# Patient Record
Sex: Female | Born: 1940 | Race: White | Hispanic: No | Smoking: Former smoker
Health system: Southern US, Community
[De-identification: ages and names within clinical notes are randomized; demographics above are authoritative.]

## PROBLEM LIST (undated history)

## (undated) DIAGNOSIS — E119 Type 2 diabetes mellitus without complications: Secondary | ICD-10-CM

## (undated) DIAGNOSIS — F329 Major depressive disorder, single episode, unspecified: Secondary | ICD-10-CM

## (undated) DIAGNOSIS — J302 Other seasonal allergic rhinitis: Secondary | ICD-10-CM

## (undated) DIAGNOSIS — K219 Gastro-esophageal reflux disease without esophagitis: Secondary | ICD-10-CM

## (undated) DIAGNOSIS — E039 Hypothyroidism, unspecified: Secondary | ICD-10-CM

## (undated) DIAGNOSIS — G4730 Sleep apnea, unspecified: Secondary | ICD-10-CM

## (undated) DIAGNOSIS — I2510 Atherosclerotic heart disease of native coronary artery without angina pectoris: Secondary | ICD-10-CM

## (undated) DIAGNOSIS — Z950 Presence of cardiac pacemaker: Secondary | ICD-10-CM

## (undated) DIAGNOSIS — F419 Anxiety disorder, unspecified: Secondary | ICD-10-CM

## (undated) DIAGNOSIS — G629 Polyneuropathy, unspecified: Secondary | ICD-10-CM

## (undated) DIAGNOSIS — G4733 Obstructive sleep apnea (adult) (pediatric): Secondary | ICD-10-CM

## (undated) DIAGNOSIS — N189 Chronic kidney disease, unspecified: Secondary | ICD-10-CM

## (undated) DIAGNOSIS — I499 Cardiac arrhythmia, unspecified: Secondary | ICD-10-CM

## (undated) DIAGNOSIS — G43909 Migraine, unspecified, not intractable, without status migrainosus: Secondary | ICD-10-CM

## (undated) DIAGNOSIS — R011 Cardiac murmur, unspecified: Secondary | ICD-10-CM

## (undated) DIAGNOSIS — I495 Sick sinus syndrome: Secondary | ICD-10-CM

## (undated) DIAGNOSIS — E785 Hyperlipidemia, unspecified: Secondary | ICD-10-CM

## (undated) DIAGNOSIS — I10 Essential (primary) hypertension: Secondary | ICD-10-CM

## (undated) DIAGNOSIS — I4891 Unspecified atrial fibrillation: Secondary | ICD-10-CM

## (undated) HISTORY — DX: Unspecified atrial fibrillation: I48.91

## (undated) HISTORY — DX: Gastro-esophageal reflux disease without esophagitis: K21.9

## (undated) HISTORY — DX: Chronic kidney disease, unspecified: N18.9

## (undated) HISTORY — PX: BREAST EXCISIONAL BIOPSY: SUR124

## (undated) HISTORY — DX: Essential (primary) hypertension: I10

## (undated) HISTORY — DX: Sleep apnea, unspecified: G47.30

## (undated) HISTORY — DX: Polyneuropathy, unspecified: G62.9

## (undated) HISTORY — PX: APPENDECTOMY: SHX54

## (undated) HISTORY — DX: Hyperlipidemia, unspecified: E78.5

## (undated) HISTORY — PX: PACEMAKER INSERTION: SHX728

## (undated) HISTORY — DX: Major depressive disorder, single episode, unspecified: F32.9

## (undated) HISTORY — DX: Type 2 diabetes mellitus without complications: E11.9

## (undated) HISTORY — DX: Sick sinus syndrome: I49.5

## (undated) HISTORY — DX: Migraine, unspecified, not intractable, without status migrainosus: G43.909

## (undated) HISTORY — DX: Other seasonal allergic rhinitis: J30.2

## (undated) HISTORY — PX: BREAST SURGERY: SHX581

## (undated) HISTORY — DX: Atherosclerotic heart disease of native coronary artery without angina pectoris: I25.10

## (undated) HISTORY — DX: Hypothyroidism, unspecified: E03.9

## (undated) HISTORY — PX: OTHER SURGICAL HISTORY: SHX169

## (undated) HISTORY — DX: Obstructive sleep apnea (adult) (pediatric): G47.33

## (undated) HISTORY — PX: CHOLECYSTECTOMY: SHX55

## (undated) HISTORY — PX: ECTOPIC PREGNANCY SURGERY: SHX613

---

## 1974-10-07 HISTORY — PX: ABDOMINAL HYSTERECTOMY: SHX81

## 1998-10-07 HISTORY — PX: TUMOR REMOVAL: SHX12

## 1999-07-30 ENCOUNTER — Encounter: Admission: RE | Admit: 1999-07-30 | Discharge: 1999-07-30 | Payer: Self-pay | Admitting: *Deleted

## 1999-07-30 ENCOUNTER — Encounter: Payer: Self-pay | Admitting: *Deleted

## 1999-08-07 ENCOUNTER — Encounter: Payer: Self-pay | Admitting: *Deleted

## 1999-08-07 ENCOUNTER — Encounter: Admission: RE | Admit: 1999-08-07 | Discharge: 1999-08-07 | Payer: Self-pay | Admitting: *Deleted

## 1999-09-05 ENCOUNTER — Ambulatory Visit (HOSPITAL_BASED_OUTPATIENT_CLINIC_OR_DEPARTMENT_OTHER): Admission: RE | Admit: 1999-09-05 | Discharge: 1999-09-05 | Payer: Self-pay | Admitting: General Surgery

## 1999-09-05 ENCOUNTER — Encounter (INDEPENDENT_AMBULATORY_CARE_PROVIDER_SITE_OTHER): Payer: Self-pay | Admitting: *Deleted

## 1999-09-05 ENCOUNTER — Encounter: Payer: Self-pay | Admitting: General Surgery

## 2000-02-19 ENCOUNTER — Encounter: Admission: RE | Admit: 2000-02-19 | Discharge: 2000-02-19 | Payer: Self-pay | Admitting: *Deleted

## 2000-02-19 ENCOUNTER — Encounter: Payer: Self-pay | Admitting: *Deleted

## 2000-05-12 ENCOUNTER — Ambulatory Visit (HOSPITAL_COMMUNITY): Admission: RE | Admit: 2000-05-12 | Discharge: 2000-05-12 | Payer: Self-pay | Admitting: Gastroenterology

## 2000-06-18 ENCOUNTER — Other Ambulatory Visit: Admission: RE | Admit: 2000-06-18 | Discharge: 2000-06-18 | Payer: Self-pay | Admitting: Obstetrics and Gynecology

## 2000-08-20 ENCOUNTER — Ambulatory Visit (HOSPITAL_COMMUNITY): Admission: RE | Admit: 2000-08-20 | Discharge: 2000-08-20 | Payer: Self-pay | Admitting: Surgery

## 2000-08-20 ENCOUNTER — Encounter: Payer: Self-pay | Admitting: Surgery

## 2001-06-24 ENCOUNTER — Encounter: Admission: RE | Admit: 2001-06-24 | Discharge: 2001-06-24 | Payer: Self-pay | Admitting: Internal Medicine

## 2001-06-24 ENCOUNTER — Encounter: Payer: Self-pay | Admitting: Internal Medicine

## 2001-12-01 ENCOUNTER — Inpatient Hospital Stay (HOSPITAL_COMMUNITY): Admission: EM | Admit: 2001-12-01 | Discharge: 2001-12-02 | Payer: Self-pay

## 2001-12-01 ENCOUNTER — Encounter: Payer: Self-pay | Admitting: Internal Medicine

## 2002-09-10 ENCOUNTER — Encounter: Payer: Self-pay | Admitting: Internal Medicine

## 2002-09-10 ENCOUNTER — Encounter: Admission: RE | Admit: 2002-09-10 | Discharge: 2002-09-10 | Payer: Self-pay | Admitting: Internal Medicine

## 2002-11-10 ENCOUNTER — Ambulatory Visit (HOSPITAL_COMMUNITY): Admission: RE | Admit: 2002-11-10 | Discharge: 2002-11-10 | Payer: Self-pay | Admitting: Gastroenterology

## 2002-11-10 ENCOUNTER — Encounter (INDEPENDENT_AMBULATORY_CARE_PROVIDER_SITE_OTHER): Payer: Self-pay | Admitting: Specialist

## 2003-06-27 ENCOUNTER — Encounter: Admission: RE | Admit: 2003-06-27 | Discharge: 2003-06-27 | Payer: Self-pay | Admitting: Internal Medicine

## 2003-06-27 ENCOUNTER — Encounter: Payer: Self-pay | Admitting: Internal Medicine

## 2003-12-22 ENCOUNTER — Encounter: Admission: RE | Admit: 2003-12-22 | Discharge: 2003-12-22 | Payer: Self-pay | Admitting: Internal Medicine

## 2004-08-20 ENCOUNTER — Ambulatory Visit (HOSPITAL_BASED_OUTPATIENT_CLINIC_OR_DEPARTMENT_OTHER): Admission: RE | Admit: 2004-08-20 | Discharge: 2004-08-20 | Payer: Self-pay | Admitting: Internal Medicine

## 2004-09-19 ENCOUNTER — Ambulatory Visit (HOSPITAL_BASED_OUTPATIENT_CLINIC_OR_DEPARTMENT_OTHER): Admission: RE | Admit: 2004-09-19 | Discharge: 2004-09-19 | Payer: Self-pay | Admitting: Internal Medicine

## 2004-12-25 ENCOUNTER — Encounter: Admission: RE | Admit: 2004-12-25 | Discharge: 2004-12-25 | Payer: Self-pay | Admitting: Internal Medicine

## 2005-02-01 ENCOUNTER — Other Ambulatory Visit: Admission: RE | Admit: 2005-02-01 | Discharge: 2005-02-01 | Payer: Self-pay | Admitting: Obstetrics and Gynecology

## 2006-01-17 ENCOUNTER — Encounter: Payer: Self-pay | Admitting: Cardiovascular Disease

## 2006-01-27 ENCOUNTER — Encounter: Payer: Self-pay | Admitting: Cardiovascular Disease

## 2006-02-12 ENCOUNTER — Ambulatory Visit (HOSPITAL_COMMUNITY): Admission: RE | Admit: 2006-02-12 | Discharge: 2006-02-13 | Payer: Self-pay | Admitting: Cardiovascular Disease

## 2006-02-12 HISTORY — PX: CARDIAC CATHETERIZATION: SHX172

## 2006-04-06 DIAGNOSIS — I495 Sick sinus syndrome: Secondary | ICD-10-CM

## 2006-04-06 HISTORY — DX: Sick sinus syndrome: I49.5

## 2006-04-15 ENCOUNTER — Inpatient Hospital Stay (HOSPITAL_COMMUNITY): Admission: RE | Admit: 2006-04-15 | Discharge: 2006-04-16 | Payer: Self-pay | Admitting: Cardiology

## 2006-10-07 HISTORY — PX: ABDOMINAL ADHESION SURGERY: SHX90

## 2007-01-05 ENCOUNTER — Encounter: Payer: Self-pay | Admitting: Cardiovascular Disease

## 2007-01-07 ENCOUNTER — Encounter: Admission: RE | Admit: 2007-01-07 | Discharge: 2007-01-07 | Payer: Self-pay | Admitting: Cardiovascular Disease

## 2007-04-22 ENCOUNTER — Emergency Department (HOSPITAL_COMMUNITY): Admission: EM | Admit: 2007-04-22 | Discharge: 2007-04-22 | Payer: Self-pay | Admitting: Emergency Medicine

## 2007-04-23 ENCOUNTER — Inpatient Hospital Stay (HOSPITAL_COMMUNITY): Admission: AD | Admit: 2007-04-23 | Discharge: 2007-04-26 | Payer: Self-pay | Admitting: Internal Medicine

## 2007-05-08 ENCOUNTER — Ambulatory Visit (HOSPITAL_COMMUNITY): Admission: RE | Admit: 2007-05-08 | Discharge: 2007-05-08 | Payer: Self-pay | Admitting: Gastroenterology

## 2007-05-15 ENCOUNTER — Encounter: Admission: RE | Admit: 2007-05-15 | Discharge: 2007-05-15 | Payer: Self-pay | Admitting: Gastroenterology

## 2007-06-29 ENCOUNTER — Ambulatory Visit (HOSPITAL_COMMUNITY): Admission: RE | Admit: 2007-06-29 | Discharge: 2007-06-29 | Payer: Self-pay | Admitting: Surgery

## 2007-09-16 ENCOUNTER — Encounter: Admission: RE | Admit: 2007-09-16 | Discharge: 2007-09-16 | Payer: Self-pay | Admitting: Internal Medicine

## 2007-11-08 HISTORY — PX: WRIST FRACTURE SURGERY: SHX121

## 2007-11-15 ENCOUNTER — Emergency Department (HOSPITAL_COMMUNITY): Admission: EM | Admit: 2007-11-15 | Discharge: 2007-11-15 | Payer: Self-pay | Admitting: Emergency Medicine

## 2007-11-20 ENCOUNTER — Ambulatory Visit (HOSPITAL_COMMUNITY): Admission: RE | Admit: 2007-11-20 | Discharge: 2007-11-20 | Payer: Self-pay | Admitting: Orthopedic Surgery

## 2009-01-02 ENCOUNTER — Encounter: Admission: RE | Admit: 2009-01-02 | Discharge: 2009-01-02 | Payer: Self-pay | Admitting: Cardiology

## 2009-01-04 ENCOUNTER — Ambulatory Visit: Payer: Self-pay | Admitting: Vascular Surgery

## 2009-01-12 ENCOUNTER — Encounter: Payer: Self-pay | Admitting: Cardiovascular Disease

## 2009-02-03 ENCOUNTER — Encounter: Admission: RE | Admit: 2009-02-03 | Discharge: 2009-02-03 | Payer: Self-pay | Admitting: Unknown Physician Specialty

## 2009-09-15 ENCOUNTER — Encounter: Admission: RE | Admit: 2009-09-15 | Discharge: 2009-09-15 | Payer: Self-pay | Admitting: Unknown Physician Specialty

## 2010-05-07 ENCOUNTER — Encounter: Admission: RE | Admit: 2010-05-07 | Discharge: 2010-05-07 | Payer: Self-pay | Admitting: Internal Medicine

## 2010-06-23 ENCOUNTER — Encounter: Payer: Self-pay | Admitting: Internal Medicine

## 2010-06-27 ENCOUNTER — Ambulatory Visit: Payer: Self-pay | Admitting: Cardiology

## 2010-10-04 ENCOUNTER — Ambulatory Visit: Payer: Self-pay | Admitting: Cardiology

## 2010-10-17 ENCOUNTER — Emergency Department (HOSPITAL_COMMUNITY)
Admission: EM | Admit: 2010-10-17 | Discharge: 2010-10-18 | Disposition: A | Discharge status: Other Acute Inpatient Hospital | Payer: Self-pay | Source: Home / Self Care | Admitting: Emergency Medicine

## 2010-10-22 LAB — CBC
HCT: 41.5 % (ref 36.0–46.0)
Hemoglobin: 13.7 g/dL (ref 12.0–15.0)
MCH: 29.1 pg (ref 26.0–34.0)
MCHC: 33 g/dL (ref 30.0–36.0)
MCV: 88.1 fL (ref 78.0–100.0)
Platelets: 191 10*3/uL (ref 150–400)
RBC: 4.71 MIL/uL (ref 3.87–5.11)
RDW: 13.7 % (ref 11.5–15.5)
WBC: 5.3 10*3/uL (ref 4.0–10.5)

## 2010-10-22 LAB — BASIC METABOLIC PANEL
BUN: 23 mg/dL (ref 6–23)
Calcium: 9.6 mg/dL (ref 8.4–10.5)
Chloride: 105 mEq/L (ref 96–112)
Creatinine, Ser: 1.28 mg/dL — ABNORMAL HIGH (ref 0.4–1.2)
GFR calc non Af Amer: 41 mL/min — ABNORMAL LOW (ref >60)
Glucose, Bld: 141 mg/dL — ABNORMAL HIGH (ref 70–99)
Potassium: 3.6 mEq/L (ref 3.5–5.1)
Sodium: 143 mEq/L (ref 135–145)

## 2010-10-22 LAB — RAPID URINE DRUG SCREEN, HOSP PERFORMED
Amphetamines: NOT DETECTED
Barbiturates: NOT DETECTED
Benzodiazepines: POSITIVE — AB
Cocaine: NOT DETECTED
Opiates: NOT DETECTED
Tetrahydrocannabinol: NOT DETECTED

## 2010-10-22 LAB — DIFFERENTIAL
Basophils Absolute: 0.1 10*3/uL (ref 0.0–0.1)
Basophils Relative: 1 % (ref 0–1)
Eosinophils Absolute: 0.1 10*3/uL (ref 0.0–0.7)
Eosinophils Relative: 2 % (ref 0–5)
Lymphocytes Relative: 33 % (ref 12–46)
Lymphs Abs: 1.7 10*3/uL (ref 0.7–4.0)
Monocytes Absolute: 0.3 10*3/uL (ref 0.1–1.0)
Monocytes Relative: 5 % (ref 3–12)
Neutro Abs: 3.1 10*3/uL (ref 1.7–7.7)
Neutrophils Relative %: 59 % (ref 43–77)

## 2010-10-22 LAB — URINALYSIS, ROUTINE W REFLEX MICROSCOPIC
Bilirubin Urine: NEGATIVE
Hgb urine dipstick: NEGATIVE
Ketones, ur: NEGATIVE mg/dL
Protein, ur: NEGATIVE mg/dL
Urobilinogen, UA: 1 mg/dL (ref 0.0–1.0)

## 2010-10-22 LAB — URINE MICROSCOPIC-ADD ON

## 2010-10-22 LAB — TRICYCLICS SCREEN, URINE: TCA Scrn: NOT DETECTED

## 2010-10-22 LAB — ETHANOL: Alcohol, Ethyl (B): <5 mg/dL (ref 0–10)

## 2010-11-05 ENCOUNTER — Encounter: Payer: Self-pay | Admitting: Internal Medicine

## 2010-11-05 ENCOUNTER — Ambulatory Visit
Admission: RE | Admit: 2010-11-05 | Discharge: 2010-11-05 | Payer: Self-pay | Source: Home / Self Care | Attending: Internal Medicine | Admitting: Internal Medicine

## 2010-11-08 NOTE — Miscellaneous (Signed)
Summary: Device preload  Clinical Lists Changes  Observations: Added new observation of PPM INDICATN: Sick sinus syndrome (06/23/2010 12:01) Added new observation of MAGNET RTE: BOL 85 ERI 65 (06/23/2010 12:01) Added new observation of PPMLEADSTAT2: active (06/23/2010 12:01) Added new observation of PPMLEADSER2: [id number]  (06/23/2010 12:01) Added new observation of PPMLEADMOD2: 4470  (06/23/2010 12:01) Added new observation of PPMLEADDOI2: 04/15/2006  (06/23/2010 12:01) Added new observation of PPMLEADLOC2: RV  (06/23/2010 12:01) Added new observation of PPMLEADSTAT1: active  (06/23/2010 12:01) Added new observation of PPMLEADSER1: [id number]  (06/23/2010 12:01) Added new observation of PPMLEADMOD1: 4469  (06/23/2010 12:01) Added new observation of PPMLEADDOI1: 04/15/2006  (06/23/2010 12:01) Added new observation of PPMLEADLOC1: RA  (06/23/2010 12:01) Added new observation of PPM IMP MD: Duffy Rhody Tennant,MD  (06/23/2010 12:01) Added new observation of PPM DOI: 04/15/2006  (06/23/2010 12:01) Added new observation of PPM SERL#: [id number]  (06/23/2010 12:01) Added new observation of PPM MODL#: [id number]  (06/23/2010 12:01) Added new observation of PACEMAKERMFG: Medtronic  (06/23/2010 12:01) Added new observation of PPM REFER MD: Duffy Rhody Tennant,MD  (06/23/2010 12:01) Added new observation of PACEMAKER MD: Hillis Range, MD  (06/23/2010 12:01)      PPM Specifications Following MD:  Hillis Range, MD     Referring MD:  Rolla Plate PPM Vendor:  Medtronic     PPM Model Number:  [id number]     PPM Serial Number:  [id number] H PPM DOI:  04/15/2006     PPM Implanting MD:  Rolla Plate  Lead 1    Location: RA     DOI: 04/15/2006     Model #: [id number]     Serial #: [id number]     Status: active Lead 2    Location: RV     DOI: 04/15/2006     Model #: [id number]     Serial #: [id number]     Status: active  Magnet Response Rate:  BOL 85 ERI 65  Indications:  Sick sinus syndrome

## 2010-11-22 NOTE — Procedures (Signed)
Summary: mdt ppm   Current Medications (verified): 1)  Tricor 145 Mg Tabs (Fenofibrate) .... Take 1 Tablet By Mouth Once Daily 2)  Prevacid 30 Mg Cpdr (Lansoprazole) .... Take 2 Capsules By Mouth Once Daily 3)  Zantac 150 Mg Tabs (Ranitidine Hcl) .... Take 1 Tablet By Mouth Once Daily 4)  Triamterene-Hctz 37.5-25 Mg Tabs (Triamterene-Hctz) .... Take 1 Tablet By Mouth Once Daily 5)  Spironolactone 25 Mg Tabs (Spironolactone) .... Take 1 Tablet By Mouth Two Times A Day 6)  Lumigan 0.03 % Soln (Bimatoprost) .... Use As Directed Daily 7)  Timolol Maleate 0.5 % Soln (Timolol Maleate) .... Use As Directed Daily 8)  Aspirin 325 Mg Tabs (Aspirin) .... Take 1 Tablet By Mouth Once Daily 9)  Toprol Xl 25 Mg Xr24h-Tab (Metoprolol Succinate) .... Take 1/2 Tablet By Mouth Two Times A Day 10)  Crestor 5 Mg Tabs (Rosuvastatin Calcium) .... Take 1 Tablet By Mouth Once Daily 11)  Synthroid 75 Mcg Tabs (Levothyroxine Sodium) .... Take 1 Tablet By Mouth Once Daily 12)  Celexa 40 Mg Tabs (Citalopram Hydrobromide) .... Take 1 Tablet By Mouth Once Daily 13)  Abilify 10 Mg Tabs (Aripiprazole) .... Take 1 Tablet By Mouth Once Daily 14)  Wellbutrin Xl 300 Mg Xr24h-Tab (Bupropion Hcl) .... Take 1 Tablet By Mouth Once Daily 15)  Vitamin D 1000 Unit Tabs (Cholecalciferol) .... Take 2 Tablets By Mouth Once Daily 16)  Namenda 10 Mg Tabs (Memantine Hcl) .... Take 2 Tablets By Mouth Once Daily 17)  Viibryd 40 Mg Tabs (Vilazodone Hcl) .... Take 1 Tabet By Mouth Once Daily  Allergies (verified): 1)  ! Pcn 2)  ! Sulfa 3)  ! * Plavix 4)  ! Lipitor 5)  ! Zocor 6)  ! Zestril  PPM Specifications Following MD:  Hillis Range, MD     Referring MD:  Rolla Plate PPM Vendor:  Medtronic     PPM Model Number:  [id number]     PPM Serial Number:  [id number] H PPM DOI:  04/15/2006     PPM Implanting MD:  Rolla Plate  Lead 1    Location: RA     DOI: 04/15/2006     Model #: [id number]     Serial #: [id number]     Status: active Lead  2    Location: RV     DOI: 04/15/2006     Model #: [id number]     Serial #: [id number]     Status: active  Magnet Response Rate:  BOL 85 ERI 65  Indications:  Sick sinus syndrome   PPM Follow Up Battery Voltage:  2.99 V       PPM Device Measurements Atrium  Amplitude: 3.5 mV, Impedance: 456 ohms, Threshold: 0.5 V at 0.4 msec Right Ventricle  Amplitude: 16 mV, Impedance: 624 ohms, Threshold: 1.0 V at 0.4 msec  Episodes MS Episodes:  0     Ventricular High Rate:  0     Atrial Pacing:  12%     Ventricular Pacing:  0%  Parameters Mode:  MVP     Lower Rate Limit:  60     Upper Rate Limit:  130 Paced AV Delay:  300     Sensed AV Delay:  300 Next Cardiology Appt Due:  02/05/2011 Tech Comments:  [place] [facility] PT----PT HAD ECT TREATMENT AT [facility] AND NEEDED RATE RESPONSE TURNED ON.  NORMAL DEVICE FUNCTION.  CHANGED RV OUTPUT FROM 2.O TO 2.5 V.  ROV IN 3 MTHS W/SK. Vella Kohler  November 11, 2010 1:35 PM

## 2010-11-28 NOTE — Cardiovascular Report (Signed)
Summary: Office Visit   Office Visit   Imported By: Roderic Ovens 11/20/2010 14:34:59  _____________________________________________________________________  External Attachment:    Type:   Image     Comment:   External Document

## 2010-11-29 ENCOUNTER — Other Ambulatory Visit (INDEPENDENT_AMBULATORY_CARE_PROVIDER_SITE_OTHER): Payer: Medicare Other

## 2010-11-29 DIAGNOSIS — I4891 Unspecified atrial fibrillation: Secondary | ICD-10-CM

## 2011-01-01 ENCOUNTER — Telehealth: Payer: Self-pay | Admitting: *Deleted

## 2011-01-01 NOTE — Telephone Encounter (Signed)
RN received a message from daughter Waynetta Sandy regarding pt's Crestor.   Pt has been off Crestor for a month and muscular pain is better.  No other complaints.  Norma Fredrickson NP notified and instructed RN to have pt to continue to stay off Crestor and have labs in three months.  [name] notified of Lori's instructions and labs scheduled for pt in June.

## 2011-02-19 NOTE — Consult Note (Signed)
NAMENEYSA, ARTS                  ACCOUNT NO.:  [id number]   MEDICAL RECORD NO.:  [id number]          PATIENT TYPE:  EMS   LOCATION:  MAJO                         FACILITY:  [facility]   PHYSICIAN:  Artist Pais. Weingold, M.D.DATE OF BIRTH:  January 09, 1941   DATE OF CONSULTATION:  11/16/2007  DATE OF DISCHARGE:  11/15/2007                                 CONSULTATION   ER CONSULT   REQUESTING PHYSICIAN:  Dr. Freida Busman or Dr. Weldon Inches.   REASON FOR CONSULTATION:  Ms. Hoopingarner is a very pleasant 70 year old  right-hand dominant female who fell while at a soccer game presents with  a displaced intra-articular fracture, distal radius and ulna, dominant  right side.  She is 70 years old.  Again, she is right-hand dominant.   ALLERGIES:  SHE HAS AN ALLERGY TO SULFA DRUGS, AS WELL AS SEVERAL OTHER  MEDICATIONS.  SHE HAS ALLERGIES TO PENICILLIN, ZESTRIL AND ZOCOR.   SOCIAL HISTORY:  Does not drink or smoke.   PAST MEDICAL HISTORY:  Significant for A-fib, depression, diabetes,  reflux disease, hyperlipidemia, hypertension, migraines.  She has a  pacemaker placed, as well as sleep apnea.  She is currently on an  aspirin a day, Celexa, Crestor, digoxin, hydrochlorothiazide and  Neurontin, Norvasc, Prevacid, atenolol, Synthroid, Toprol, Zyrtec and  Wellbutrin.  She has allergies to Lipitor, penicillin, sulfa, Zestril  and Zocor.   EXAMINATION:  GENERAL:  A well-nourished female, pleasant, alert and  oriented x3.  EXTREMITIES:  Examination of her upper extremity on the right.  She has  a painful and swollen and slightly deformed wrist and hand.  She is  neurovascularly intact.  She has good digital range of motion.  She has  median, ulnar, and radial nerve function intact.  No evidence of skin  breakdown, no bleeding wounds, no open wounds etc.  Left upper extremity  was normal by comparison.  X-rays show a fracture of the distal radius  and ulna intra-articular.   This is a 70 year old female,  intra-articular fracture right distal  radius.  I discussed with both Ms. Mckown and her daughter, she will be  placed in a well-padded sugar-tong splint.  The patient has no  significant excess dorsal angulation.  We will see her in my office on  Tuesday and set her up electively for open reduction, internal fixation,  carpal tunnel release, etc.  She is discharged on a sugar tong splint,  Percocet for pain, again follow up my office this Tuesday.      Artist Pais Mina Marble, M.D.  Electronically Signed     MAW/MEDQ  D:  11/16/2007  T:  11/16/2007  Job:  [id number]

## 2011-02-19 NOTE — Procedures (Signed)
CAROTID DUPLEX EXAM   INDICATION:  Dizziness and syncope.   HISTORY:  Diabetes:  No.  Cardiac:  Pacemaker.  Hypertension:  Yes.  Smoking:  No.  Previous Surgery:  No.  CV History:  No.  Amaurosis Fugax No, Paresthesias No, Hemiparesis No                                       RIGHT             LEFT  Brachial systolic pressure:         102               110  Brachial Doppler waveforms:         Biphasic          Biphasic  Vertebral direction of flow:        Antegrade         Antegrade  DUPLEX VELOCITIES (cm/sec)  CCA peak systolic                   78                85  ECA peak systolic                   119               113  ICA peak systolic                   95                77  ICA end diastolic                   35                26  PLAQUE MORPHOLOGY:                  Calcified         Calcified  PLAQUE AMOUNT:                      Mild              Mild  PLAQUE LOCATION:                    Bif/ICA           Bif/ICA   IMPRESSION:  Bilateral 20-39% internal carotid artery stenosis.   ___________________________________________  Larina Earthly, M.D.   AC/MEDQ  D:  01/05/2009  T:  01/05/2009  Job:  [id number]

## 2011-02-19 NOTE — Op Note (Signed)
NAMEAALYSSA, ELDERKIN                  ACCOUNT NO.:  [id number]   MEDICAL RECORD NO.:  [id number]          PATIENT TYPE:  AMB   LOCATION:  DAY                          FACILITY:  [facility]   PHYSICIAN:  Thornton Park. Daphine Deutscher, MD  DATE OF BIRTH:  [birth year]   DATE OF PROCEDURE:  06/29/2007  DATE OF DISCHARGE:                               OPERATIVE REPORT   CCS NUMBER:  [id number].   PREOPERATIVE INDICATIONS:  The patient is a 70 year old white female who  has had recent right-sided abdominal pain to the right of her umbilicus  requiring hospitalization.  She had a negative CT enterography and  negative vascular studies.  There was a point of maximal tenderness  which is about 2 inches to the right of her umbilicus when she was  sitting up, which she was reproducible both on her visit June 05, 2007, as well as this morning when I marked her in the holding area.  There was a little an X placed and it was again antibiotics about 2  inches to the right of the umbilicus in the midline at a site of her  previous right paramedian incision.   As a part of the informed consent, she is aware that laparoscopy and  possible laparotomy would be done and that this might not relieve her  abdominal pain.   ALLERGIES:  She is was allergic to PENICILLIN and SULFA.   CURRENT MEDICATIONS:  Tricor, Prevacid, spironolactone, Neurontin,  Timolol Optic, Toprol XL, digoxin, Celexa, Maxalt XL, morphine, Norvasc,  triamterene, hydrochlorothiazide, Lumigan, Zyrtec, Crestor, Synthroid,  propranolol, and Phenergan.   PREVIOUS SURGICAL HISTORY:  1. Hysterectomy.  2. Cholecystectomy.  3. Appendectomy.  4. She has an indwelling permanent Medtronic pacemaker.   SURGEON:  Thornton Park. Daphine Deutscher, MD.   ASSISTANT:  None.   ANESTHESIA:  General.   DESCRIPTION OF PROCEDURE:  The patient was taken back to room 11, given  general anesthesia.  The abdomen was prepped with Techni-Care and draped  sterilely.  Previously, the X  marked over her abdomen was there and  visible.  I entered the abdomen through the left upper quadrant using a  0 degree 5 mm scope, the Opti[employer]u [employer] technique.  The abdomen was  entered without difficulty and insufflated.  Once insufflation occurred,  I had the scope in place and I was struck by the adhesions right beneath  the site of the X.  I took a picture, which is included her chart.  I  put another 5 mm port on the left side to left of the umbilicus without  difficulty using an [employer] 5.  I then used sharp dissection  with no electrocautery to sharply take down these adhesions at the  anterior abdominal wall.  These were taken all the way up to the liver  and the ligament of Treitz.  I took down some adhesions, allowing the  pyloric region of her stomach to kind of fall back from the anterior  abdominal wall.   When these were all taken down, I surveyed the anterior abdominal wall,  did  not see any evidence of a hernia or any kind of a permanent suture  that could be cutting and causing the pain.  The abdominal viscera were  examined, removed the omentum superiorly exposing the transverse colon  and there was a band from the omentum down to the ascending colon that  could produce an internal hernia and I went ahead and sharply lysed  that.  Small intestine was peristalsing nicely.  It was striking there  was an absence of adhesions.  Once these anterior abdominal wall  adhesions had been taken down, the bowel itself looked free and clear.  I looked at the liver and it looked normal and the spleen looked normal.  The ascending colon appeared without masses.  Transverse colon a large  omental mass coming off of that, but it was freed from the bowel.  The  descending colon looked to be in good order as did the pelvis where she  had had previous surgery.   As I had found positive findings beneath the site of her pain, I elected  to stop the procedure at this point and  withdrew the gas.  The wounds  were closed with 4-0 Vicryl, Benzoin Steri-Strips.  They had been  injected with Marcaine.  The patient was awakened and taken to the  recovery room in satisfactory addition.  She will hopefully be able to  go home later in the day.   FINAL DIAGNOSIS:  Adhesions to the anterior abdominal wall at the site  of pain, status post laparoscopy and enterolysis.      Thornton Park Daphine Deutscher, MD  Electronically Signed     MBM/MEDQ  D:  06/29/2007  T:  06/29/2007  Job:  [id number]   cc:   Georgann Housekeeper, MD  Fax: [phone]   Shirley Friar, MD  Fax: [phone]   Vesta Mixer, M.D.  Fax: [phone]

## 2011-02-19 NOTE — Op Note (Signed)
NAMEQuentin, Heidi [name]                  ACCOUNT NO.:  [id number]   MEDICAL RECORD NO.:  [id number]          PATIENT TYPE:  AMB   LOCATION:  SDS                          FACILITY:  [facility]   PHYSICIAN:  Artist Pais. Weingold, M.D.DATE OF BIRTH:  26-Feb-1941   DATE OF PROCEDURE:  11/20/2007  DATE OF DISCHARGE:  11/20/2007                               OPERATIVE REPORT   PREOPERATIVE DIAGNOSIS:  Displaced fracture, right distal radius.   POSTOPERATIVE DIAGNOSIS:  Displaced fracture, right distal radius.   PROCEDURE:  Open reduction internal fixation above, using DVR plate and  screws.  Standard right plate and carpal tunnel release through separate  incision.   SURGEON:  Artist Pais. Mina Marble, M.D.   ASSISTANT:  None.   ANESTHESIA:  General.   TOURNIQUET TIME:  55 minutes.   COMPLICATIONS:  No complications.   DRAINS:  None.   OPERATIVE REPORT:  The patient was taken to the operating suite.  After  the induction of adequate general anesthesia, the right upper extremity  was prepped and draped in the usual sterile fashion.  An Esmarch was  used to exsanguinate the limb.  The tourniquet was then inflated to 250  mmHg.  At this point in time, an incision was made on the volar aspect  of the right distal forearm and wrist area; centered over the flexor  carpi radialis tendon.  The skin was incised for 68 cm.  The sheath  overlying the FCR was incised.  The FCR was tracked to the midline and  the radial artery to the lateral side.  The fascia underneath the FCR  was incised.  The dissection was carried down to the level of the  pronator quadratus.  The pronator quadratus was subperiosteally stripped  off the distal radius volar aspect, revealing intra-articular fracture -  - that was reduced with traction and manual pressure.  After this was  done, the first dorsal compartment was released as well as the brachial  radialis; to help aid in maintaining reduction.  Once this was done, a  standard DVR right plate was fastened to the volar aspect of the distal  radius through the slotted hole.  Intraoperative fluoroscopy revealed  good placement of the plate.  This was fixed with the 4 proximal  threaded cortical screws, followed by 7 smooth pegs distally.  Intraoperative fluoroscopy revealed anatomic reduction in both the AP  lateral and oblique views.  The wound was then thoroughly irrigated.  It  was closed in layers of 0 Vicryl for the pronator quadratus, and then 3-  0 Prolene subcuticular stitch on the skin.  A second incision was made  in the palmar aspect of the right hand, in line with the long finger  metacarpal; starting at Kaplan's cardinal line.  Skin was incised.  Palmar fascia was identified and split.  The distal edge of the  transverse carpal ligament was then divided under direct vision.  The  distal edges of the transverse carpal ligament was then divided with a  #15 blade.  The median nerve was identified, protected with the Cisco  elevator.  The remaining aspects of the transverse carpal ligament were  then divided under direct vision using curved, blunt scissors.  The  canal was inspected.  There were  no osseous lesions or ganglions present.  A small amount of blood was in  the canal, which was irrigated; and then this was also loosely closed  with a 3-0 Prolene subcuticular stitch.  Steri-Strips, 4x4 fluffs and a  volar splint was applied.  The patient tolerated the procedures well and  went to the recovery room in stable fashion.      Artist Pais Mina Marble, M.D.  Electronically Signed     MAW/MEDQ  D:  11/20/2007  T:  11/22/2007  Job:  [id number]

## 2011-02-19 NOTE — H&P (Signed)
Heidi [name], Heidi [name]                  ACCOUNT NO.:  [id number]   MEDICAL RECORD NO.:  [id number]          PATIENT TYPE:  EMS   LOCATION:  MAJO                         FACILITY:  [facility]   PHYSICIAN:  Georgann Housekeeper, MD      DATE OF BIRTH:  12/21/1940   DATE OF ADMISSION:  04/22/2007  DATE OF DISCHARGE:                              HISTORY & PHYSICAL   CHIEF COMPLAINT:  Abdominal pain, nausea, vomiting.   HISTORY OF PRESENT ILLNESS:  A 70 year old female with history of  hypertension, paroxysmal atrial fibrillation, pacemaker insertion 2007,  sleep apnea and acid reflux disease comes in complaining of abdominal  pain. Patient initially seen at the office on 7/14 with a 3 or 4 day  history of abdominal pain and some nausea. She had lab work done  including blood chemistry, blood counts, and abdominal plain x-ray which  is unremarkable. Did not have any evidence of any obstruction. She has a  little bit of constipation. She took some stool softener, MiraLax, but  she said had loose stools. Continues to have abdominal pain, persistent,  constant, mid to right abdomen area 7/10 pain with some vomiting and  poor p.o. intake. The patient was seen in the emergency room earlier in  the morning. Had a CT scan of the abdomen and pelvis which is negative.  Lab work was all unremarkable including lipase, CBC, blood chemistries,  and urinalysis. Patient comes to the office and will be admitted for  further evaluation.   ALLERGIES:  SULFA, PENICILLIN. INTOLERANT TO ZOCOR AND PRAVACHOL.   PAST MEDICAL HISTORY:  1. __________ atrial fibrillation.  2. Pacemaker, has seen Dr. Elease Hashimoto.  3. Hypertension.  4. Obstructive sleep apnea.  5. Type 2 diabetes, diet controlled.  6. Acid reflux disease.  7. Depression.  8. Neuropathy.  9. Osteopenia.  10.Dyslipidemia.  11.Hypothyroidism.   MEDICATIONS:  1. Celexa 60 mg daily.  2. Prevacid 30 mg daily.  3. Aspirin 325 daily.  4. Maxzide 25 mg daily.  5.  Tricor 145 mg daily.  6. Aldactone 25 mg b.i.d.  7. Norvasc 5 mg daily.  8. Neurontin 300 mg t.i.d.  9. Crestor 5 mg daily.  10.Toprol 50 mg 1/2 tablet q. day.  11.Digoxin 0.25 daily.  12.Synthroid 50 mcg daily.  13.CPAP machine at night.   PAST SURGICAL HISTORY:  1. Status post gallbladder surgery.  2. Status post __________.  3. Status post appendectomy.  4. Status post pacemaker insertion 2007.   SOCIAL HISTORY:  She is widow. Does not smoke, no alcohol. 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 She has  1 son and daughter.   FAMILY HISTORY:  Noncontributory.   PROCEDURES:  1. As far as her colon evaluation, it was done in December of 2007      which showed small benign polyp.  2. EGD in 2001.   PHYSICAL EXAMINATION:  VITAL SIGNS:  Blood pressure:  120/80.  Temperature:  99.2. Pulse:  74. Weight:  138 pounds.  GENERAL:  She was awake, lying on the exam table, complaining  of some  abdominal pain.  HEENT:  Pupils reactive. Extraocular movements intact.  NECK:  Supple.  CARDIOVASCULAR:  Regular rhythm. S1, S2. Pacemaker insertion on the left  chest area.  LUNGS:  Clear.  ABDOMEN:  Soft, tender in the mid abdomen. Positive bowel sounds,  nondistended.  EXTREMITIES:  No edema.  NEURO:  Nonfocal.   LABORATORY DATA:  From 04/23/07 from the ER; UA was negative. White count  6.0, hemoglobin 14.3. Chemistry:  sodium 137, potassium 3.2, creatinine  0.7. LFTs:  AST of 42, ALT 49, total bili __________. Lipase was 18.  CT of the abdomen and pelvis was negative.   IMPRESSION:  A 70 year old female with abdominal pain, nausea, and  vomiting going on for about a week. CT of the abdomen and pelvis  negative. Lab unremarkable. No evidence of infection or bowel  obstruction. Differential includes mesenteric ischemia, stenosis, or SMA  versus gastroenteritis. Clinically no evidence of a bowel obstruction.   PLAN:  1. Admit to telemetry, clear  liquid diet, IV fluids, CT angio of the      abdomen and pelvis. Repeat the blood      chemistry and blood counts tomorrow and thyroid check. GI consult.      Wait on abdominal pelvis angio results.  2. Hypertension:  Continue on current regimen. Hold the Maxzide and      Aldactone for now.  3. Mild hypokalemia:  Replace potassium.      Georgann Housekeeper, MD  Electronically Signed     KH/MEDQ  D:  04/23/2007  T:  04/23/2007  Job:  [id number]

## 2011-02-19 NOTE — Consult Note (Signed)
Heidi [name], Heidi [name]                  ACCOUNT NO.:  [id number]   MEDICAL RECORD NO.:  [id number]          PATIENT TYPE:  INP   LOCATION:  4703                         FACILITY:  [facility]   PHYSICIAN:  Shirley Friar, MDDATE OF BIRTH:  [birth year]   DATE OF CONSULTATION:  04/23/2007  DATE OF DISCHARGE:                                 CONSULTATION   REQUESTING PHYSICIAN:  Georgann Housekeeper, MD.   INDICATIONS:  Abdominal pain, nausea and vomiting.   HISTORY OF PRESENT ILLNESS:  A 70 year old white female with four weeks  of right-sided abdominal pain, nausea and vomiting.  She says the pain  is in her right lower quadrant and right mid quadrant without any  consistent nature to it in terms of sometimes it is made worse by  eating, sometimes is better with eating.  She does give a chronic  history of constipation but denies any worsened change in that.  She had  a CAT scan done today which was unremarkable for any acute findings.  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  Initially, CT angiogram was ordered but that was cancelled due  to adequate visualization of the mesenteric vessels per interventional  radiology.  She denies any rectal bleeding or hematemesis.  She had a  colonoscopy in 2007 which reportedly showed one polyp, but official  documentation is not available at this time.   PAST MEDICAL HISTORY:  1. Atrial fibrillation.  2. History of pacemaker.  3. Hypertension.  4. Obstructive sleep apnea.  5. Diabetes.  6. Reflux.  7. Depression.  8. Hyperlipidemia.  9. Hypothyroidism.  10.Osteopenia.   MEDICATIONS:  See chart.   REVIEW OF SYSTEMS:  Negative except as stated above.   PHYSICAL EXAMINATION:  VITAL SIGNS:  Temperature 97.8, pulse 62, blood  pressure 111/55, O2 sat 98% on room air.  GENERAL:  Alert, no acute distress.  ABDOMEN:  Focal area of tenderness in right lower and right mid quadrant  with guarding, otherwise nontender,  soft, nondistended, positive bowel  sounds.   LABORATORY DATA:  White blood count 6, hemoglobin 14.3, platelet count  236.  Potassium 3.2, BUN 8, creatinine 0.76.  LFTs with T-bili 1, ALP  51, AST 42, ALT 49.   IMPRESSION:  A 70 year old white female with four weeks of abdominal  pain, nausea and vomiting with negative CT scan.  Her abdominal pain  continues to be in the area of her distal small bowel, therefore I doubt  an upper endoscopy will be helpful at this time.  Would recommend a CT  scan with small bowel enterography to further evaluate her small  intestine which should be a better test than a small bowel series.  Other possibilities include capsule endoscopy if she continues to have  this abdominal pain with negative workup.  Would start with small bowel  enterography and follow up on that prior to ordering a capsule  endoscopy.  Abdominal pain is unusual to be acute onset in the past four  weeks, especially without any ischemic component.  May need  to do a  Doppler ultrasound of her abdomen to look for portal vein thrombosis, if  small bowel enterography is unremarkable.  Thank you for the  consultation.  I will follow along with you.      Shirley Friar, MD  Electronically Signed     VCS/MEDQ  D:  04/23/2007  T:  04/24/2007  Job:  [id number]   cc:   Georgann Housekeeper, MD  Danise Edge, M.D.

## 2011-02-22 NOTE — Cardiovascular Report (Signed)
NAMESTOREY, STANGELAND                  ACCOUNT NO.:  [id number]   MEDICAL RECORD NO.:  [id number]          PATIENT TYPE:  OIB   LOCATION:  2807                         FACILITY:  [facility]   PHYSICIAN:  Colleen Can. Deborah Chalk, M.D.DATE OF BIRTH:  Aug 26, 1941   DATE OF PROCEDURE:  DATE OF DISCHARGE:                              CARDIAC CATHETERIZATION   PROCEDURE:  Implantation of dual-chamber pulse generator under fluoroscopy.   INDICATION FOR PROCEDURE:  Tachybrady syndrome.   PROCEDURE:  The right subclavicular area was prepped and draped.  The area  was infiltrated with 1% Xylocaine.  Subcutaneous pocket was created to the  prepectoral fascia.  Using IV contrast to locate the subclavian vein,  punctures were made over top of the first rib.  Guidewires were introduced.  Using 7-French [facility] introducer, the atrial and ventricular leads were  placed.  The ventricular lead was a  Guidant active fixation, model 4470 52-  cm lead, serial number [id number].  The ventricular threshold was 0.5 volts,  capture 0.5 msec pulse width.  R waves 15.2 mV.  Impedance was 175 ohms and  a current 0.6 MA.   The atrial lead was a Guidant model 4469 45-cm lead, serial number [id number]  [id number], bipolar endocardial pacing lead.  The atrial threshold was 1.1  volts, capture 0.5 msec pulse width.  __________ -waves 2.2 mV.  Impedance  was 741 ohms and 1.8 MA current.   The leads were sutured place.  The wound was flushed with kanamycin  solution.  The leads were connected to a Medtronic Enrhythm  P1501 DR pulse  generator, serial number [id number].  The unit was sutured in place.  The  wound was closed with 2-0 and subsequently 4-0 Vicryl.  Steri-Strips were  applied.  The patient tolerated the procedure well.      Colleen Can. Deborah Chalk, M.D.  Electronically Signed     SNT/MEDQ  D:  04/15/2006  T:  04/15/2006  Job:  [id number]

## 2011-02-22 NOTE — H&P (Signed)
Heidi [name], Heidi [name]                  ACCOUNT NO.:  [id number]   MEDICAL RECORD NO.:  [id number]          PATIENT TYPE:  OIB   LOCATION:  NA                           FACILITY:  [facility]   PHYSICIAN:  Colleen Can. Deborah Chalk, M.D.DATE OF BIRTH:  01-08-1941   DATE OF ADMISSION:  04/15/2006  DATE OF DISCHARGE:                                HISTORY & PHYSICAL   CHIEF COMPLAINT:  Fatigue with documented tachycardia-bradycardia syndrome.   HISTORY OF PRESENT ILLNESS:  Ms. Melfi is a very pleasant, 70 year old  female.  She has had a history of palpitations with paroxysmal atrial  fibrillation.  She has continued to have episodes of palpitations.  She has  been previously on beta-blocker therapy, however, with trials of increased  doses, she has had bradycardia as well as significant symptoms which have  included tremendous fatigue, extreme sleepiness and inability to  concentrate.  She has had no frank syncope.  She now presents for pacemaker  implantation in order to be able to maximize medicines without symptomatic  bradycardia.   PAST MEDICAL HISTORY:  1.  Known atherosclerotic cardiovascular disease.  She underwent her last      catheterization in May 2007, per Dr. Melburn Popper, which showed minor luminal      irregularities of the left circumflex of 10%.  There is a 40% stenosis      in the mid right coronary.  The LAD is normal with ejection fraction      65%.  2.  Hyperlipidemia.  3.  Hypertension.  4.  Paroxysmal atrial fibrillation.  5.  Peripheral neuropathy.  6.  Glaucoma.  7.  Depression.  8.  History of sleep apnea.  She does use a CPAP machine.  9.  Seasonal allergies.  10. History of migraine headaches.   PAST SURGICAL HISTORY:  1.  Hysterectomy.  2.  Appendectomy.  3.  Cholecystectomy.  4.  Fibroid tumors from the breasts which were benign.  5.  History of goiter removal.   ALLERGIES:  PENICILLIN and SULFA.  Intolerances are ZESTRIL, ZOCOR AND  LIPITOR.   CURRENT  MEDICATIONS:  1.  Tri-Chlor 145 mg a day.  2.  Norvasc 5 mg a day.  3.  Zoloft 100 mg two tablets a day.  4.  Triamterene/hydrochlorothiazide 37.5/25 mg daily.  5.  Prevacid 30 mg one to two times per day.  6.  Lumigan eye drops daily.  7.  Neurontin 300 mg b.i.d.  8.  Zyrtec 10 mg a day.  9.  Timolol eye drops daily.  10. Aspirin daily.  11. Crestor 5 mg a day.  12. Propranolol p.r.n.  13. Digoxin 0.25 mg daily.   FAMILY HISTORY:  Her father died at 24 due to stomach cancer.  Her mother  died at 68 of natural causes.   SOCIAL HISTORY:  She is a widow.  She quit smoking in 1962, but really had  no significant tobacco use.  She does not drink alcohol.   REVIEW OF SYSTEMS:  CONSTITUTIONAL:  Basically as noted above.  She has had  extreme fatigue, sleepiness and  inability to concentrate with titration of  medications.  She feels quite sluggish.  She has had no frank syncope.  CARDIOPULMONARY:  She continues to have tachycardic palpitations.  She has  had no further chest pains.  No shortness of breath.  She does take proton  pump inhibitor on a one to two times basis.  GASTROINTESTINAL:  She has had  no problems with abdominal pain and denies any recent fever, flu or cough.   PHYSICAL EXAMINATION:  GENERAL:  She is a very pleasant, middle-aged white  female in no acute distress.  VITAL SIGNS:  Weight 150 pounds, blood pressure 108/64 sitting, 112/72  standing, heart rate 64 and regular today, respirations 18, afebrile.  SKIN:  Warm and dry.  Color unremarkable.  LUNGS:  Clear.  HEART:  Regular rate and rhythm.  HEENT:  Unremarkable.  There is a scar at the base of the neck.  ABDOMEN:  Soft, positive bowel sounds, nontender.  EXTREMITIES:  Without edema.  NEUROLOGIC:  Intact.  No gross focal deficits.   LABORATORY DATA AND X-RAY FINDINGS:  Pertinent labs are pending.   IMPRESSION:  1.  Symptomatic tachycardia-bradycardia syndrome.  2.  Paroxysmal atrial fibrillation.  3.   Known ischemic heart disease.  4.  Hyperlipidemia.  5.  History of hypertension.  6.  Peripheral neuropathy.  7.  Glaucoma.  8.  Depression.  9.  Sleep apnea.   PLAN:  The patient is felt to best be served by implantation of a dual-  chamber pacemaker in order to maximize medicines and hopefully without  worsening of symptoms.  The procedures, risks and benefits have all been  explained and she is willing to proceed on Tuesday, April 15, 2006.      Sharlee Blew, N.P.      Colleen Can. Deborah Chalk, M.D.  Electronically Signed    LC/MEDQ  D:  04/14/2006  T:  04/14/2006  Job:  [id number]   cc:   Georgann Housekeeper, MD  Fax: [phone]   Vesta Mixer, M.D.  Fax: [phone]

## 2011-02-22 NOTE — Op Note (Signed)
   [name], Heidi [name]                            ACCOUNT NO.:  [id number]   MEDICAL RECORD NO.:  [id number]                   PATIENT TYPE:  AMB   LOCATION:  ENDO                                 FACILITY:  [facility]   PHYSICIAN:  Danise Edge, M.D.                DATE OF BIRTH:  [birth year]   DATE OF PROCEDURE:  11/10/2002  DATE OF DISCHARGE:                                 OPERATIVE REPORT   PROCEDURE:  Colonoscopy and polypectomy.   INDICATIONS:  The patient is a 70 year old female born 06/14/2041.  The patient  is scheduled to undergo her first screening colonoscopy with polypectomy to  prevent colon cancer.   ENDOSCOPIST:  Danise Edge, M.D.   PREMEDICATION:  Versed 7 mg, Demerol 50 mg.   ENDOSCOPE:  Olympus adult colonoscope.   DESCRIPTION OF PROCEDURE:  After obtaining informed consent, the patient was  placed in the left lateral decubitus position.  I administered intravenous  Demerol and intravenous Versed to achieve conscious sedation for the  procedure.  The patient's blood pressure, oxygen saturation, and cardiac  rhythm were monitored throughout the procedure and documented in the medical  record.   Anal inspection was normal.  Digital rectal exam was normal.  The Olympus  pediatric video colonoscope was introduced into the rectum and advanced to  the cecum.  Colonic preparation for the exam today was excellent.   Rectum normal.   Sigmoid colon and descending colon normal.   Splenic flexure normal.   Transverse colon normal.   Hepatic flexure normal.   Ascending colon:  From the proximal ascending colon a 1 mm sessile polyp was  removed with the electrocautery snare.   Cecum and ileocecal valve:  From the mid-cecum two 2 mm sessile polyps were  removed with the electrocautery snare.    ASSESSMENT:  A small polyp was removed from the ascending colon and two  small polyps were removed from the cecum; all polyps were submitted in one  bottle for pathologic  evaluation.                                               Danise Edge, M.D.    MJ/MEDQ  D:  11/10/2002  T:  11/10/2002  Job:  [id number]   cc:   Georgann Housekeeper, M.D.  [address]  [place]  [place] [address]  Fax: [phone]

## 2011-02-22 NOTE — Consult Note (Signed)
[place]. [facility]  Patient:    Heidi [name], Heidi [name] Visit Number: [id number] MRN: [id number]          Service Type: MED Location: 2000 2040 01 Attending Physician:  Georgann Housekeeper Dictated by:   Armanda Magic, M.D. Admit Date:  12/01/2001 Discharge Date: 12/02/2001   CC:         Dr. _______   Consultation Report  CHIEF COMPLAINT:  Unstable angina.  HISTORY OF PRESENT ILLNESS:  This is a 70 year old, white female with a history of hyperlipidemia with total cholesterol greater than 300, hypertension, and now with several week history of chest pressure and shortness of breath. She has had several episodes of exertion. Today she had an episode at work lasting approximately 15 minutes associated with shortness of breath. There was no nausea, vomiting or diaphoresis. It was described as "someone sitting on my chest" with radiation to the neck. EKG was done in her doctors office which showed no acute changes. She also had an episode last week while walking. She currently has 1 to 2/10 chest pain. This is not like her typical pain she gets with her reflux.  PAST MEDICAL HISTORY:  Significant for hypertension, depression, gastroesophageal reflux, hyperlipidemia, glaucoma, migraines, thyroid goiter.  PAST SURGICAL HISTORY:  Status post left breast fibroadenoma, status post appendectomy, status post cholecystectomy, status post thyroidectomy, status post total abdominal hysterectomy.  ALLERGIES: 1. PENICILLIN. 2. SULFA. 3. ZOCOR. 4. LIPITOR.  MEDICATIONS: 1. Prevacid 30 mg a day. 2. Norvasc 5 mg a day. 3. Zoloft 20 mg a day. 4. Maxzide 25 mg a day. 5. Aldactone 25 mg t.i.d. 6. Estratest q.d. 7. Tricor 160 mg a day.  REVIEW OF SYSTEMS:  None.  SOCIAL HISTORY:  She is married with 2 children; denies tobacco or alcohol use.  FAMILY HISTORY:  Father died at 11 of cancer, unknown primary. Mother died in her 37s.  PHYSICAL EXAMINATION:  VITAL  SIGNS:  Blood pressure is 130/90, heart rate 60. She is afebrile.  GENERAL:  She is a well-developed, well-nourished, white female in no acute distress.  HEENT:  Benign.  NECK:  Supple without lymphadenopathy. No bruits.  LUNGS:  Clear to auscultation throughout.  HEART:  There is a regular rate and rhythm. No murmurs, rubs or gallops. Normal S1, S2.  ABDOMEN:  Soft, nontender, nondistended, with active bowel sounds. No hepatosplenomegaly.  EXTREMITIES:  No cyanosis, erythema, or edema. Good distal pulses.  LABORATORY AND ACCESSORY DATA:  Labs are pending.  EKG shows normal sinus rhythm with no acute changes.  ASSESSMENT:  Unstable angina in a patient with strong cardiac risk factors including hypertension, hyperlipidemia, and her age. The symptoms are very consistent with angina.  PLAN:  Agree with subcutaneous Lovenox and aspirin. Would start Plavix. Will plan cardiac catheterization in the morning. Risks and benefits of cardiac catheterization were discussed with the patient including myocardial infarction, cerebrovascular accident, death, bleeding complications, dye allergies, vascular complications, or renal insufficiency. The patient understands and wishes to proceed. Dictated by:   Armanda Magic, M.D. Attending Physician:  Georgann Housekeeper DD:  12/01/01 TD:  12/01/01 Job: [id number]

## 2011-02-22 NOTE — Discharge Summary (Signed)
NAMENAJMO, PARDUE                  ACCOUNT NO.:  [id number]   MEDICAL RECORD NO.:  [id number]          PATIENT TYPE:  INP   LOCATION:  4703                         FACILITY:  [facility]   PHYSICIAN:  Georgann Housekeeper, MD      DATE OF BIRTH:  08/09/1941   DATE OF ADMISSION:  04/23/2007  DATE OF DISCHARGE:  04/26/2007                               DISCHARGE SUMMARY   DISCHARGE DIAGNOSES:  1. Abdominal pain.  2. Possibly adhesions versus rule out mesenteric ischemia.   As far as her medications on discharge, remains:  1. Celexa 60 mg daily.  2. Prevacid 30 mg daily.  3. Aspirin 325 daily.  4. Maxzide 25 mg daily.  5. Tricor 145 mg daily.  6. Aldactone 25 mg b.i.d.  7. Norvasc 5 mg daily.  8. Neurontin 300 mg t.i.d.  9. Crestor 5 mg daily.  10.Toprol 50 mg half-tablet daily.  11.Synthroid 50 mcg daily.  12.Digoxin 0.25 daily.   CONSULTATIONS:  GI.   PROCEDURES:  CT of the abdomen showed no evidence of any ischemic bowels  and normal patent vessels, iliac and SMA.  CT of the small bowel  angiography which was negative for any small bowel pathology.   LABORATORY DATA:  Normal blood counts and normal chemistries, negative.   HOSPITAL COURSE:  The patient was admitted with abdominal pain, nausea  and vomiting.  The patient had an upper GI and endoscopy in the past.  Blood work, urinalysis and negative CT scan were performed.  It did not  show any evidence of any ischemic bowels or no mesenteric ischemia, SMA.  She also had a CT angio of the abdomen and pelvis.  Did not record any  evidence of any iliac or SMA stenosis.   GI was consulted.  Agreed that the patient, since the workup was  negative, to follow up as an outpatient with sub-supportive treatment.  She probably has some lysis from her previous surgeries and will need  further followup or  laparoscopy.  There was no evidence of any bowel obstruction.  The  patient tolerated the liquids well and blood pressure and electrolytes  remain stable.  The patient will be followed up as an outpatient and/or  surgical evaluation if needed.      Georgann Housekeeper, MD  Electronically Signed     KH/MEDQ  D:  07/13/2007  T:  07/13/2007  Job:  [id number]

## 2011-02-22 NOTE — Op Note (Signed)
[place]. [facility]  Patient:    Heidi [name]                     MRN: [id number] Proc. Date: 09/05/99 Adm. 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  OPERATION:  Right breast biopsy with need localization and specimen mammography.  SURGEON:  Rose Phi. Maple Hudson, M.D.  ANESTHESIA:  MAC.  OPERATIVE PROCEDURE:  The patient was placed on the operating table and the right breast prepped and draped in the usual fashion.  Curvilinear incision was outlined around the previously placed wire, centered at about the 1 oclock position of the right breast.  The area was then thoroughly infiltrated with .25% Marcaine. Incision was made and sharp dissection of wire and surrounding tissue were excised. Hemostasis obtained with electrocautery.  Specimen mammography confirmed the removal of the lesion.  Subcuticular closure with 4.0 Monocryl and Steri-Strips carried out.  Dressing applied.  The patient transferred to recovery room in satisfactory condition, having tolerated the procedure well. DD:  09/05/99 TD:  09/02/99 Job: [id number]

## 2011-02-22 NOTE — Cardiovascular Report (Signed)
[place]. [facility]  Patient:    Heidi [name], Heidi [name] Visit Number: [id number] MRN: [id number]          Service Type: MED Location: 2000 2040 01 Attending Physician:  Georgann Housekeeper Dictated by:   Daisey Must, M.D. [facility] Proc. Date: 12/02/01 Admit Date:  12/01/2001   CC:         Tyson Dense, M.D.  Cardiac Catheterization Lab   Cardiac Catheterization  PROCEDURE:  Left heart catheterization with coronary angiography and left ventriculography.  CARDIOLOGIST:  Daisey Must, M.D. [facility]  INDICATION:  Ms. Altier is a 70 year old woman with significant cardiac risk factors.  She was admitted to the hospital with episodes recurrent substernal chest pain.  She was referred for cardiac catheterization.  PROCEDURAL NOTE:  A 6-French sheath was placed in the right femoral artery. Standard Judkins 6-French catheters were utilized.  Contrast was Omnipaque. There were no complications.  RESULTS:  HEMODYNAMICS:  Left ventricular pressure 142/15, aortic pressure 132/74. There was no aortic valve gradient.  LEFT VENTRICULOGRAM:  Wall motion is normal.  Ejection fraction calculated at 70%.  There is no mitral regurgitation.  CORONARY ANGIOGRAPHY: (Right dominant).  Left main is normal.  Left anterior descending artery has a 20% stenosis in the mid vessel.  The LAD gives rise to a small first diagonal, a normal size second diagonal, and a small third diagonal.  The left circumflex gives rise to a small OM-1, large branching OM-2, and a small OM-3.  OM-1 has a 60% stenosis.  OM-2 has a diffuse 30% stenosis.  The right coronary artery is a dominant vessel.  It gives rise to a normal size posterior descending artery and two small posterolateral branches.  There is a 30% stenosis in the proximal right coronary artery followed by a tubular 50% stenosis in the mid right coronary artery.  IMPRESSIONS: 1. Normal left ventricular systolic function. 2. Mild to  moderate coronary artery disease as described.  Her coronary artery    disease does not appear to be obstructive in nature.  PLAN:  The patient will be managed medically. Dictated by:   Daisey Must, M.D. [facility] Attending Physician:  Georgann Housekeeper DD:  12/02/01 TD:  12/02/01 Job: [id number]

## 2011-02-22 NOTE — Discharge Summary (Signed)
NAMEROGENIA, WERNTZ                  ACCOUNT NO.:  [id number]   MEDICAL RECORD NO.:  [id number]          PATIENT TYPE:  OIB   LOCATION:  4738                         FACILITY:  [facility]   PHYSICIAN:  Colleen Can. Deborah Chalk, M.D.DATE OF BIRTH:  [birth year]   DATE OF ADMISSION:  04/15/2006  DATE OF DISCHARGE:  04/16/2006                                 DISCHARGE SUMMARY   DATE OF ADMISSION:  April 15, 2006   DATE OF DISCHARGE:  April 16, 2006   PRIMARY DISCHARGE DIAGNOSIS:  Symptomatic tachycardia/bradycardia syndrome  with subsequent implantation of a Medtronic dual chamber EnRhythm pacemaker  P1501DR, serial [id number].   SECONDARY DISCHARGE DIAGNOSES:  1.  Paroxysmal atrial fibrillation.  2.  Atherosclerotic cardiovascular disease.  3.  Hypertension.  4.  Hyperlipidemia.  5.  Neuropathy.  6.  Glaucoma.  7.  Depression.  8.  Sleep apnea.   HISTORY OF PRESENT ILLNESS:  The patient is a very pleasant 70 year old  white female.  She has a history of palpitations and has documented  paroxysmal atrial fibrillation.  She has had chronic episodes of tachycardia  and despite being on beta-blocker, had significant bradycardia with  tremendous fatigue, extreme sleepiness, and inability to concentrate.  She  subsequently presented for pacemaker implantation in order to be able to  maximize medicines without worsening bradycardia.  She has not had frank  syncope.   Please see the history and physical for further patient presentation and  profile.   LABORATORY DATA ON ADMISSION:  PT and PTT were unremarkable.  CBC was  normal.  Chemistries were normal.   HOSPITAL COURSE:  The patient was admitted electively in order to undergo  pacemaker implantation.  That procedure was tolerated well without any known  complications.  A dual chamber EnRhythm P1501DR, serial [id number] was  implanted.  Thresholds were satisfactory and postprocedure she was  transferred to 4700.  Today, on April 16, 2006, she  is doing well except for  soreness around the wound.  Her overall physical exam is unremarkable.  The  pacemaker site itself shows no signs of infection.  Her chest x-ray is  currently pending.  The next day pacemaker check is satisfactory.  Overall,  she is felt to be a satisfactory candidate for discharge today and will be  placed back on Toprol at time of discharge.   DISCHARGE CONDITION:  Stable.   DISCHARGE MEDICINES:  Which will continue as she was taking before, which  includes:  1.  TriCor 145 a day.  2.  Norvasc 5 a day.  3.  Zoloft 100 mg 2 tablets a day.  4.  Maxzide daily.  5.  Prevacid 30 mg 1 to 2 times a day.  6.  Neurontin 300 b.i.d.  7.  Zyrtec 10 mg a day.  8.  Aspirin daily.  9.  Digoxin 0.25 daily.  10. Crestor 5 mg a day.  11. We will be restarting Toprol-XL 50 mg, only taking a half a tablet      daily.  12. Her eye drops will be as before.  13. She can  use Tylenol p.r.n.   Extensive written instructions are given regarding pacemaker care.  Specifically, not to get the wound wet for the next 4-5 days.  She is also  given Betadine swabs to paint the wound 2 times a day for the next 3 days.  Will have her avoid raising the right arm above her head for the next 2  weeks and will plan on having her follow up with Dr. Elease Hashimoto in the early  part of next week, certainly sooner if any problems would arise in the  interim.      Sharlee Blew, N.P.      Colleen Can. Deborah Chalk, M.D.  Electronically Signed    LC/MEDQ  D:  04/16/2006  T:  04/16/2006  Job:  [id number]   cc:   Georgann Housekeeper, MD  Fax: [phone]   Vesta Mixer, M.D.  Fax: [phone]

## 2011-02-22 NOTE — Discharge Summary (Signed)
NAMELORAIN, FETTES                  ACCOUNT NO.:  [id number]   MEDICAL RECORD NO.:  [id number]          PATIENT TYPE:  OIB   LOCATION:  2005                         FACILITY:  [facility]   PHYSICIAN:  Vesta Mixer, M.D. DATE OF BIRTH:  01/14/1941   DATE OF ADMISSION:  02/12/2006  DATE OF DISCHARGE:  02/13/2006                                 DISCHARGE SUMMARY   DISCHARGE DIAGNOSES:  1.  Mild coronary artery disease.  2.  Noncardiac chest pain.  3.  Gastroesophageal reflux disease with esophageal spasm.  4.  Hypertension.  5.  Hyperlipidemia.   DISCHARGE MEDICATIONS:  1.  Tricor 145 mg a day.  2.  Norvasc 5 mg a day.  3.  Zoloft 200 mg a day.  4.  Aspirin 81 mg a day.  5.  Maxzide 37.5 mg/25 mg a day.  6.  Lumigan eye drops daily.  7.  Prevacid 30 mg a day.  8.  Neurontin 300 mg twice a day.  9.  Toprol XL 25 mg a day.  10. Nitroglycerin as needed.   DISPOSITION:  The patient will see Dr. Elease Hashimoto in the office in one week.  Will check a basic metabolic profile at that time.  She is call for any  further episodes of chest pain.   HISTORY:  Ms. Swiss is a 70 year old female with a history of chest pains  in the past.  She also has had some palpitations.  She presented to the  office yesterday with episodes of chest pain and palpitations.  [facility] monitor revealed that she was having intermittent episodes atrial  fibrillation.  Because of her ongoing chest pain, she was brought to the  hospital for admission.   HOSPITAL COURSE BY PROBLEMS:  CHEST PAIN:  The patient ruled out for  myocardial infarction.  She had three sets of CPK, MB and troponin levels  all of which were negative.  She had heart catheterization which revealed  minor coronary artery irregularities.  She had a 30 to 40% long tubular  stenosis in the mid right coronary artery.  This did not improve with  nitroglycerin.  It is not flow obstruction.  She has normal left ventricular  systolic function  with an ejection fraction around 65%.   The patient will be started on Toprol XL 25 mg a day.  We will hold her  Aldactone.  It is most likely that the cause of chest pain was due to  esophageal spasm or perhaps esophageal reflex.  We will continue with the  above-noted medications and I will see her back in the office.  All of her  other medical problems are stable.           ______________________________  Vesta Mixer, M.D.     PJN/MEDQ  D:  02/13/2006  T:  02/14/2006  Job:  [id number]

## 2011-02-22 NOTE — Procedures (Signed)
[facility]  Patient:    Heidi [name], Heidi [name]                           MRN: [id number] Proc. Date: 05/12/00 Attending:  Verlin Grills, M.D. CC:         Georgann Housekeeper, M.D.                           Procedure Report  REFERRING PHYSICIAN:  Georgann Housekeeper, M.D.  PROCEDURE PERFORMED:  Esophagogastroduodenoscopy.  ENDOSCOPIST:  Verlin Grills, M.D.  INDICATIONS:  Heidi [name] is a 70 year old female with symptoms related to gastroesophageal reflux despite Prevacid 30 mg twice daily.  She continues to have uncontrolled gastroesophageal reflux symptoms.  She is being evaluated for laparoscopic antireflux surgery.  I discussed with Heidi [name] the complication associated with esophagogastroduodenoscopy including intestinal bleeding and intestinal perforation.  Heidi [name] has signed the operative permit.  PREMEDICATIONS:  Demerol 50 mg and Versed 5 mg.  ENDOSCOPE:  Olympus gastroscope.  DESCRIPTION OF PROCEDURE:  After obtaining informed consent, the patient was placed in the left lateral decubitus position.  I administered intravenous Demerol and intravenous Versed to achieve conscious sedation for the procedure.  The patients blood pressure, oxygen saturation and cardiac rhythm were monitored throughout the procedure and documented in the medical record.  The Olympus gastroscope was passed through the posterior hypopharynx into the proximal esophagus without difficulty.  The hypopharynx, larynx and vocal cords appeared normal.  Esophagoscopy:  The proximal mid and lower segments of the esophagus appear normal.  The squamocolumnar junction and the esophagogastric junction are noted at 37 cm from the incisor teeth.  Endoscopically, there is no evidence of erosive esophagitis, Barretts esophagus, esophageal mucosal scarring or esophageal ulcers.  Gastroscopy:  There is no evidence of a hiatal hernia endoscopically. Retroflex view of the gastric  cardia and fundus was normal.  Endoscopic appearance of the gastric body, antrum and pylorus was normal.  Duodenoscopy:  The duodenal bulb, mid duodenum and distal duodenum appeared normal.  ASSESSMENT:  Normal esophagogastroduodenoscopy.  RECOMMENDATIONS:  I would recommend referring Heidi [name] to a surgeon to discuss laparoscopic antireflux surgery.  If preoperative esophageal manometry is required I will schedule Heidi [name] esophageal manometry to be performed at [facility]. DD:  05/12/00 TD:  05/13/00 Job: [id number]

## 2011-02-22 NOTE — Discharge Summary (Signed)
[place]. [facility]  Patient:    Heidi [name], Heidi [name] Visit Number: [id number] MRN: [id number]          Service Type: MED Location: 2000 2040 01 Attending Physician:  Georgann Housekeeper Dictated by:   [id number] Admit Date:  12/01/2001 Discharge Date: 12/02/2001                             Discharge Summary  DATE OF BIRTH:  [birth year]  DISCHARGE DIAGNOSES: 1. Chest pain, rule out myocardial infarction. 2. Gastroesophageal reflux disease.  DISCHARGE MEDICATIONS: 1. Prevacid 30 mg twice a day. 2. Aspirin 325 mg q.d. 3. Maxzide 25 mg once a day. 4. Zoloft 200 mg q.d. 5. Tricor 160 mg q.d. 6. Aldactone 25 mg b.i.d. 7. Norvasc 5 mg q.d.  CONDITION UPON DISCHARGE:  Stable.  DIET:  Low-cholesterol, low-salt diet.  HISTORY OF PRESENT ILLNESS:  Please see H&P for details.  HOSPITAL COURSE:  CARDIAC:  The patient was admitted due to substernal chest pain.  Normal EKG.  She ruled out with normal cardiac enzymes.  Because of the risk factors, she underwent cardiac catheterization which showed an Ef of 70% with normal LAD and left main.  There was 50% mid RCA lesion and some irregularities in the circumflex which was mostly read as nonobstructive mild CAD, and the patient was discharged.  The chest pains were also thought to be contributed to by esophageal spasms and reflux.  Prevacid was increased to twice a day, and follow up in clinic in two to three weeks. Dictated by:   [id number] Attending Physician:  Georgann Housekeeper DD:  12/29/01 TD:  12/29/01 Job: [id number]

## 2011-02-22 NOTE — Cardiovascular Report (Signed)
[name], Heidi [name]                  ACCOUNT NO.:  [id number]   MEDICAL RECORD NO.:  [id number]          PATIENT TYPE:  OIB   LOCATION:  2005                         FACILITY:  [facility]   PHYSICIAN:  Vesta Mixer, M.D. DATE OF BIRTH:  1941/01/09   DATE OF PROCEDURE:  02/12/2006  DATE OF DISCHARGE:                              CARDIAC CATHETERIZATION   [name] is a 70 year old female with a history of mild coronary artery  disease in the past.  She presented to the office today with chest pain  lasting for the past 12 hours.  She also had intermittent atrial  fibrillation.  She is brought to the catheterization laboratory for further  evaluation.   PROCEDURE:  Left heart catheterization with coronary angiography.   The right femoral artery was easily cannulated using a modified Seldinger  technique.   HEMODYNAMICS:  The LV pressure was 107/11 with an aortic pressure of 110/65.   ANGIOGRAPHY:  Left main:  The left main coronary artery is fairly smooth and  normal.   The left anterior descending artery is fairly normal.  There are several  moderate sized diagonal arteries that are normal.   Left circumflex artery gives off a large obtuse marginal artery and then has  minor luminal irregularities of about 10%.   The right coronary artery is a large and dominant vessel.  There is a mid  40% stenosis.  This stenosis is a long tubular stenosis.  The posterior  descending artery and the posterolateral segment artery are normal.   The left ventriculogram was performed in a 30 RAO position.  It reveals  normal left ventricular systolic function.  Ejection fraction 65%.   COMPLICATIONS:  None.   CONCLUSION:  Minor coronary artery irregularities primarily involving the  mid right coronary artery.  She has a 40% stenosis.  We will need to be very  aggressive with her cholesterol medicines as much as possible.  She has  tried several cholesterol medications in the past has not  tolerated them.  Her left ventricular systolic function is normal.           ______________________________  Vesta Mixer, M.D.     PJN/MEDQ  D:  02/12/2006  T:  02/13/2006  Job:  [id number]

## 2011-02-22 NOTE — Procedures (Signed)
Heidi [name], Heidi [name]                  ACCOUNT NO.:  [id number]   MEDICAL RECORD NO.:  [id number]          PATIENT TYPE:  OUT   LOCATION:  SLEEP CENTER                 FACILITY:  [facility]   PHYSICIAN:  Clinton D. Maple [facility], M.D. DATE OF BIRTH:  [birth year]   DATE OF STUDY:                              NOCTURNAL POLYSOMNOGRAM   INDICATION FOR STUDY:  Hypersomnia with sleep apnea.  CPAP titration  requested.  Epworth sleepiness score 17/24, neck size 13.5 inches, BMI 24.5,  weight 135 pounds.  The diagnostic NPSG on August 20, 2004, recorded an  RDI of 11 per hour and periodic limb movement with arousal syndrome, 17 per  hour.   SLEEP ARCHITECTURE:  Total sleep time 380 minutes with sleep efficiency 83%.  Stage 1 was 11%, stage 2 73%, stages 3 and 4 11%, REM was 5% of total sleep  time, sleep latency 13.5 minutes, REM latency 265 minutes, awake after sleep  onset 65 minutes, arousal index 24.  Bedtime medications were taken  including Neurontin and Wellbutrin which may affect sleep.   RESPIRATORY DATA:  CPAP titration protocol.  CPAP was titrated to 11 CWP,  RDI 5.1 per hour.  The technician tried several masks including a ResMed  Swift with small nasal pillows and a small ResMed Ultra Mirage full face  mask.  There was some difficulty fitting comfortably to her face and there  was some tendency to leak.  She may benefit from a chin strap but suggest  that if there is difficulty with home trial, additional effort to fit a mask  for her face may be justified.  A heated humidifier was used.   OXYGEN DATA:  Mild snoring before CPAP control.  Mean oxygen saturation on  CPAP was 96%.   CARDIAC DATA:  Normal sinus rhythm.   MOVEMENT/PARASOMNIA:  A total of 129 limb jerks were recorded, of which 84  were associated with arousal or awakening, for a periodic limb movement with  arousal index of 13.2 per hour which is increased.   IMPRESSION/RECOMMENDATION:  CPAP titration to 11 CWP, RDI 5.1 per hour  which  is satisfactory.  Suggest initial home trial with a small ResMed Ultra  Mirage full face mask and humidifier, anticipating possible need for some  careful mask refitting if leaks are troublesome.  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  It may be appropriate to  treat this with clonazepam or Requip.                                                           Clinton D. Maple Hudson, M.D.  Diplomate, [employer]   CDY/MEDQ  D:  09/23/2004 13:10:42  T:  09/24/2004 18:20:40  Job:  [id number]

## 2011-02-22 NOTE — H&P (Signed)
[place]. [facility]  Patient:    Heidi, [name] Visit Number: [id number] MRN: [id number]          Service Type: MED Location: 2000 2040 01 Attending Physician:  Georgann Housekeeper Dictated by:   Tyson Dense, M.D. Admit Date:  12/01/2001 Discharge Date: 12/02/2001                           History and Physical  DATE OF BIRTH: 08/04/1941.  CHIEF COMPLAINT:  Ms. Heidi [name] is a 70 year old female with a chief complaint of chest pain.  HISTORY OF PRESENT ILLNESS:  This is a 70 year old female with history of hypertension, acid reflux disease, and depression.  She came in to the office with an episode of substernal chest pain, lasted about 15 to 20 minutes while she was at work.  This was about 11:30 to 12 p.m. today, December 01, 2001. She had a little bit of shortness of breath.  No nausea, vomiting, or diaphoresis.  Her daughter works at the [employer].  She went there and had an EKG which was normal.  Her blood pressure was 150/100 there.  The patient says that the pain had been resolved by then.  She reports that on further questioning she had some exertional symptoms with chest pain one week ago while walking.  It lasted a few minutes, about five or seven, and she had been having some pain on and off in the past which lasted about two to three minutes and resolved on its own.  This symptom of chest pain were different than her acid reflux symptoms which she gets burning.  This was more like substernal heaviness.  In the office, she was almost pain-free.  Not diaphoretic or nauseous.  There was some radiation of pain to the neck when it happened and some pain to the right arm.  CARDIAC RISK FACTORS:  She has hypertension.  Hyperlipidemia.  No diabetes. No tobacco.  Negative family history.  PAST MEDICAL HISTORY: 1. Depression. 2. Hypertension. 3. Gastroesophageal reflux disease. 4. Hyperlipidemia. 5. Glaucoma. 6.  History of migraine headaches. 7. History of thyroid goiter.  PAST SURGICAL HISTORY:  Status post left breast fibroadenoma removed, status post appendectomy, status post gallbladder, status post thyroidectomy, status post total abdominal hysterectomy.  MEDICATIONS: 1. Prevacid 30 mg q.d. 2. Norvasc 5 mg q.d. 3. Zoloft 200 mg q.d. 4. Maxzide 25 mg q.d. 5. Aldactone 25 mg b.i.d. 6. Estratest 1 q.d. 7. Tri-chlor 150 mg q.d. 8. Lumigan eye drops 1 q.d.  ALLERGIES:  PENICILLIN, SULFA, and also intolerance to ZOCOR and LIPITOR.  SOCIAL HISTORY:  She does not smoke.  No alcohol.  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  Married with two children.  FAMILY HISTORY:  Father died at 11 with unknown cancer, primary.  Mother in her 23s.  PHYSICAL EXAMINATION:  VITAL SIGNS:  Blood pressure 130/90, pulse 60, temperature 97.0.  GENERAL:  Well-appearing female in no acute distress.  Comfortable.  Little bit of tearful.  LUNGS:  Clear.  HEENT:  Pupils were equal and reactive.  Extraocular muscle was intact.  NECK:  Supple.  No lymphadenopathy or JVD.  No thyromegaly.  CARDIOVASCULAR:  Regular S1 and S2 without murmurs, rubs, or gallops.  ABDOMEN:  Soft, positive bowel sounds, without tenderness.  EXTREMITIES:  No edema.  NEUROLOGICAL:  Nonfocal.  LABORATORY DATA:  EKG was done at the office today.  It  showed normal sinus rhythm without ST or T-wave changes.  IMPRESSION: 58. A 70 year old female with history of risk factors with chest pain    at rest.  Differential includes unstable angina which is noncardiac.  I    will put her on telemetry.  Start her on nitroglycerin low dose drip and    Lovenox per pharmacy and aspirin.  Continue her blood pressure medication.    Rule out by enzymes and we will contact cardiology for possible study once    she rules out. 2. Hypertension:  Continue on medication. 3. Acid reflux disease:  Continue on Prevacid. Dictated by:    Tyson Dense, M.D. Attending Physician:  Georgann Housekeeper DD:  12/01/01 TD:  12/01/01 Job: [id number]

## 2011-02-22 NOTE — Procedures (Signed)
[name], Heidi [name]                  ACCOUNT NO.:  [id number]   MEDICAL RECORD NO.:  [id number]          PATIENT TYPE:  OUT   LOCATION:  SLEEP CENTER                 FACILITY:  [facility]   PHYSICIAN:  Clinton D. Maple Hudson, M.D. DATE OF BIRTH:  [birth year]   DATE OF STUDY:  08/20/2004                              NOCTURNAL POLYSOMNOGRAM   REFERRING PHYSICIAN:  Georgann Housekeeper, MD   INDICATION FOR STUDY:  Hypersomnia with sleep apnea.  Epworth Sleepiness  Score 16/24, BMI 24, weight 135 pounds.   SLEEP ARCHITECTURE:  Total sleep time 381 minutes with sleep efficiency 85%.  Stage 1 13%, stage 2 75%, stages 3 and 4 9%.  REM was 3% of total sleep  time.  Awake after sleep onset 38 minutes, arousal index of 59, which is  increased.   RESPIRATORY DATA:  RDI 11 per hour indicating mild obstructive sleep  apnea/hypopnea syndrome.  This included 11 obstructive apneas and 59  hypopneas.  Events were not positional.  REM RDI 48 per hour.  She had  insufficient early events to permit use of split study protocol for  titration.   OXYGEN DATA:  Moderate snoring with oxygen desaturation to a nadir of 85%  with apneas.  Mean oxygen saturation through the study was 95% on room air.   CARDIAC DATA:  Sinus rhythm.   MOVEMENT/PARASOMNIA:  164 limb jerks were recorded of which 108 were  associated with arousal or awakening, for a periodic limb movement with  arousal index of 17 per hour, which is markedly elevated.   IMPRESSION/RECOMMENDATION:  1.  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  2:  Oxygen desaturation to 85% with apneas, otherwise mean saturation 95%.  1.  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  This may justify      specific therapy with clonazepam or Requip if clinically appropriate.  2.  Consider return for CPAP titration if appropriate.                                                           Clinton D. Maple Hudson, M.D.  Diplomate, [employer] Board   CDY/MEDQ  D:   08/26/2004 11:59:19  T:  08/26/2004 16:26:12  Job:  [id number]

## 2011-04-01 ENCOUNTER — Other Ambulatory Visit: Payer: Self-pay | Admitting: *Deleted

## 2011-04-01 DIAGNOSIS — E785 Hyperlipidemia, unspecified: Secondary | ICD-10-CM

## 2011-04-02 ENCOUNTER — Other Ambulatory Visit (INDEPENDENT_AMBULATORY_CARE_PROVIDER_SITE_OTHER): Payer: Medicare Other | Admitting: *Deleted

## 2011-04-02 DIAGNOSIS — E785 Hyperlipidemia, unspecified: Secondary | ICD-10-CM

## 2011-04-02 LAB — BASIC METABOLIC PANEL
BUN: 25 mg/dL — ABNORMAL HIGH (ref 6–23)
CO2: 26 mEq/L (ref 19–32)
Calcium: 9.7 mg/dL (ref 8.4–10.5)
Chloride: 109 mEq/L (ref 96–112)
Creatinine, Ser: 1 mg/dL (ref 0.4–1.2)
GFR: 56.97 mL/min — ABNORMAL LOW (ref >60.00)
Glucose, Bld: 109 mg/dL — ABNORMAL HIGH (ref 70–99)
Potassium: 4.4 mEq/L (ref 3.5–5.1)
Sodium: 143 mEq/L (ref 135–145)

## 2011-04-02 LAB — LIPID PANEL
Cholesterol: 188 mg/dL (ref 0–200)
HDL: 58.4 mg/dL (ref >39.00)
LDL Cholesterol: 113 mg/dL — ABNORMAL HIGH (ref 0–99)
Total CHOL/HDL Ratio: 3
Triglycerides: 85 mg/dL (ref 0.0–149.0)
VLDL: 17 mg/dL (ref 0.0–40.0)

## 2011-04-02 LAB — HEPATIC FUNCTION PANEL
ALT: 44 U/L — ABNORMAL HIGH (ref 0–35)
AST: 32 U/L (ref 0–37)
Albumin: 4.1 g/dL (ref 3.5–5.2)
Alkaline Phosphatase: 58 U/L (ref 39–117)
Bilirubin, Direct: 0.2 mg/dL (ref 0.0–0.3)
Total Bilirubin: 0.4 mg/dL (ref 0.3–1.2)
Total Protein: 6.5 g/dL (ref 6.0–8.3)

## 2011-04-03 ENCOUNTER — Telehealth: Payer: Self-pay | Admitting: *Deleted

## 2011-04-03 NOTE — Telephone Encounter (Signed)
Message copied by Adolphus Birchwood on Wed Apr 03, 2011 12:14 PM ------      Message from: Adolphus Birchwood      Created: Wed Apr 03, 2011  8:54 AM                   ----- Message -----         From: Rosalio Macadamia, NP         Sent: 04/03/2011   7:46 AM           To: Maryruth Hancock Burns-Spencer, RN            Ok to report. Continue same medicines. Needs to increase water. Liver test just slightly up. Recheck HPF in 3 months. Forward copy to PCP.

## 2011-04-03 NOTE — Telephone Encounter (Signed)
Pt's daughter Waynetta Sandy notified of lab results and to have pt increase water intake.  Also, instructed to have pt continue same medications.  Per Waynetta Sandy pt will have f/u liver functions in three months with Psychiatrist or PCP.

## 2011-05-20 ENCOUNTER — Other Ambulatory Visit: Payer: Self-pay | Admitting: Internal Medicine

## 2011-05-20 DIAGNOSIS — Z1231 Encounter for screening mammogram for malignant neoplasm of breast: Secondary | ICD-10-CM

## 2011-06-03 ENCOUNTER — Ambulatory Visit
Admission: RE | Admit: 2011-06-03 | Discharge: 2011-06-03 | Disposition: A | Discharge status: Home / Self Care | Payer: Medicare Other | Source: Ambulatory Visit | Attending: Internal Medicine | Admitting: Internal Medicine

## 2011-06-03 DIAGNOSIS — Z1231 Encounter for screening mammogram for malignant neoplasm of breast: Secondary | ICD-10-CM

## 2011-06-04 ENCOUNTER — Other Ambulatory Visit: Payer: Self-pay | Admitting: Cardiology

## 2011-06-05 NOTE — Telephone Encounter (Signed)
escribe medication per fax request  

## 2011-06-07 ENCOUNTER — Encounter: Payer: Self-pay | Admitting: *Deleted

## 2011-06-27 ENCOUNTER — Ambulatory Visit (INDEPENDENT_AMBULATORY_CARE_PROVIDER_SITE_OTHER): Payer: Medicare Other | Admitting: *Deleted

## 2011-06-27 ENCOUNTER — Encounter: Payer: Self-pay | Admitting: *Deleted

## 2011-06-27 DIAGNOSIS — I495 Sick sinus syndrome: Secondary | ICD-10-CM

## 2011-06-27 LAB — PACEMAKER DEVICE OBSERVATION
AL IMPEDENCE PM: 456 Ohm
AL THRESHOLD: 0.5 V
ATRIAL PACING PM: 5.93
BATTERY VOLTAGE: 2.96 V
RV LEAD IMPEDENCE PM: 616 Ohm

## 2011-06-28 LAB — CBC
HCT: 38
Hemoglobin: 13
MCHC: 34.1
MCV: 87.2
Platelets: 221
RBC: 4.36
RDW: 12.4
WBC: 6.9

## 2011-06-28 LAB — BASIC METABOLIC PANEL
BUN: 16
CO2: 31
Calcium: 9.9
Chloride: 98
Creatinine, Ser: 0.96
GFR calc Af Amer: >60
GFR calc non Af Amer: 58 — ABNORMAL LOW
Glucose, Bld: 97
Potassium: 3.7
Sodium: 138

## 2011-07-18 LAB — BASIC METABOLIC PANEL
CO2: 32
Calcium: 10
Creatinine, Ser: 0.87
GFR calc Af Amer: >60
GFR calc non Af Amer: >60
Sodium: 140

## 2011-07-18 LAB — URINALYSIS, ROUTINE W REFLEX MICROSCOPIC
Bilirubin Urine: NEGATIVE
Glucose, UA: NEGATIVE
Ketones, ur: NEGATIVE
Nitrite: NEGATIVE
Protein, ur: NEGATIVE
pH: 6

## 2011-07-18 LAB — HEMOGLOBIN AND HEMATOCRIT, BLOOD: HCT: 41.6

## 2011-07-22 LAB — COMPREHENSIVE METABOLIC PANEL
AST: 29
Albumin: 3 — ABNORMAL LOW
Albumin: 4
BUN: 8
Calcium: 9.1
GFR calc non Af Amer: >60
Glucose, Bld: 85
Glucose, Bld: 95
Potassium: 3.6
Sodium: 144
Total Bilirubin: 0.8
Total Protein: 5.3 — ABNORMAL LOW
Total Protein: 7

## 2011-07-22 LAB — URINALYSIS, ROUTINE W REFLEX MICROSCOPIC
Ketones, ur: NEGATIVE
Nitrite: NEGATIVE
Specific Gravity, Urine: 1.003 — ABNORMAL LOW
pH: 7

## 2011-07-22 LAB — DIFFERENTIAL
Lymphocytes Relative: 39
Lymphs Abs: 2.4
Monocytes Relative: 7
Neutro Abs: 3.1
Neutrophils Relative %: 51

## 2011-07-22 LAB — CBC
HCT: 42.3
Hemoglobin: 11.9 — ABNORMAL LOW
Hemoglobin: 14.3
MCHC: 33.7
Platelets: 236
RBC: 4.16
RDW: 12.9
RDW: 13.1
WBC: 5.1

## 2011-07-22 LAB — LIPASE, BLOOD: Lipase: 18

## 2011-09-23 ENCOUNTER — Other Ambulatory Visit: Payer: Self-pay

## 2011-09-23 MED ORDER — METOPROLOL TARTRATE 25 MG PO TABS: 25.0000 mg | ORAL_TABLET | Freq: Two times a day (BID) | ORAL | Status: DC

## 2011-10-17 ENCOUNTER — Other Ambulatory Visit: Payer: Self-pay | Admitting: Cardiovascular Disease

## 2011-10-17 MED ORDER — METOPROLOL TARTRATE 25 MG PO TABS: ORAL_TABLET | ORAL | Status: DC

## 2011-10-17 NOTE — Telephone Encounter (Signed)
Fax Received. Refill Completed. Heidi [name] (R.M.A)   

## 2011-10-31 ENCOUNTER — Encounter: Payer: Self-pay | Admitting: *Deleted

## 2011-10-31 ENCOUNTER — Encounter: Payer: Self-pay | Admitting: Cardiovascular Disease

## 2011-11-07 ENCOUNTER — Encounter: Payer: Self-pay | Admitting: Cardiovascular Disease

## 2011-11-07 ENCOUNTER — Ambulatory Visit (INDEPENDENT_AMBULATORY_CARE_PROVIDER_SITE_OTHER): Payer: Medicare Other | Admitting: Cardiovascular Disease

## 2011-11-07 VITALS — BP 123/76 | HR 88 | Ht 62.0 in | Wt 143.8 lb

## 2011-11-07 DIAGNOSIS — I10 Essential (primary) hypertension: Secondary | ICD-10-CM | POA: Diagnosis not present

## 2011-11-07 DIAGNOSIS — R55 Syncope and collapse: Secondary | ICD-10-CM | POA: Diagnosis not present

## 2011-11-07 DIAGNOSIS — I4891 Unspecified atrial fibrillation: Secondary | ICD-10-CM | POA: Insufficient documentation

## 2011-11-07 MED ORDER — METOPROLOL TARTRATE 25 MG PO TABS: ORAL_TABLET | ORAL | Status: DC

## 2011-11-07 NOTE — Progress Notes (Signed)
Heidi [name] Date of Birth  [birth year] [facility]     [place] [facility]  [address]    [address]   [address] [place], [place]  [address]    [place], [place]  [address] [phone]  Fax  [phone]  [phone]  Fax [phone]   History of Present Illness:  Heidi [name]  is a 71 year old female who with a history of paroxysmal atrial fibrillation, mild to moderate coronary artery disease, depression, and dementia. She has not had any cardiac problems the past several months.    Current Outpatient Prescriptions on File Prior to Visit  Medication Sig Dispense Refill  . aspirin 325 MG buffered tablet Take 325 mg by mouth daily.        Marland Kitchen azelastine (ASTELIN) 137 MCG/SPRAY nasal spray Place 2 sprays into the nose 2 (two) times daily as needed. Use in each nostril as directed      . bimatoprost (LUMIGAN) 0.01 % SOLN Place 1 drop into both eyes at bedtime.        . brimonidine-timolol (COMBIGAN) 0.2-0.5 % ophthalmic solution Place 1 drop into both eyes every 12 (twelve) hours.        . cetirizine (ZYRTEC) 10 MG tablet Take 10 mg by mouth daily.        . Cholecalciferol (VITAMIN D) 2000 UNITS tablet Take 2,000 Units by mouth daily.        Marland Kitchen docusate sodium (COLACE) 100 MG capsule Take 100 mg by mouth daily.        . fenofibrate (TRICOR) 145 MG tablet Take 145 mg by mouth daily.        . fluticasone (VERAMYST) 27.5 MCG/SPRAY nasal spray Place 1 spray into the nose 2 (two) times daily as needed.       . gabapentin (NEURONTIN) 300 MG capsule Take 300 mg by mouth 3 (three) times daily.        Marland Kitchen levothyroxine (SYNTHROID, LEVOTHROID) 75 MCG tablet Take 75 mcg by mouth daily.        . metoprolol tartrate (LOPRESSOR) 25 MG tablet Take a half tablet by mouth twice daily.  60 tablet  0  . nortriptyline (PAMELOR) 25 MG capsule Take 25 mg by mouth at bedtime.       . nortriptyline (PAMELOR) 50 MG capsule Take 100 mg by mouth at bedtime.        . Olopatadine HCl (PATADAY) 0.2 % SOLN Apply  1 drop to eye as needed.       . ranitidine (ZANTAC) 300 MG capsule Take 300 mg by mouth 2 (two) times daily.       Marland Kitchen spironolactone (ALDACTONE) 25 MG tablet Take 25 mg by mouth 2 (two) times daily.        Marland Kitchen triamterene-hydrochlorothiazide (DYAZIDE) 37.5-25 MG per capsule Take 1 capsule by mouth every morning.          Allergies  Allergen Reactions  . Adhesive (Tape)   . Atorvastatin   . Crestor (Rosuvastatin Calcium)     Caused Myalgias  . Lipitor (Atorvastatin Calcium)     Causes Rash  . Nardil     Caused BP Problems  . Penicillins     Causes Rash  . Plavix (Clopidogrel Bisulfate)     Causes Rash  . Simvastatin     Causes Rash  . Sulfonamide Derivatives     Causes Swelling  . Zestril (Lisinopril)     Causes Rash    Past Medical History  Diagnosis Date  . Seasonal allergies   . HTN (hypertension)   . Hyperlipidemia   . Major depression     10-17-2010: hospitalized for suicidal, [facility] (D/C 10-30-2010)  . Peripheral neuropathy   . GERD (gastroesophageal reflux disease)   . Sleep apnea   . Glaucoma     Narrow Angle  . Migraine headache   . Hypothyroid   . Atrial fibrillation     Intermittent  . CAD (coronary artery disease)     non obstructive  . 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 Appendectomy   . Abdominal hysterectomy 1976  . Tumor removal 2000    Fibroid from breast x4-most recent  . Cardiac catheterization 02-12-2006    Minor irregularities: RCA-mid 40%, LM-normal, LAD-normal. EF 65%.  . Pacemaker insertion     Permanent. Medtronic EnRhyth 04/2006, set as DDDR  . Abdominal adhesion surgery 2008    exploratory to remove attached to the abdominal wall, stomach and intestines.   . Wrist fracture surgery 11-2007    right, had metal plate inserted  . Ectopic pregnancy surgery     treatment x3, last one 11-02-2009 at [facility]    History  Smoking status  . Never Smoker     Smokeless tobacco  . Not on file    History  Alcohol Use: Not on file    No family history on file.  Reviw of Systems:  Reviewed in the HPI.  All other systems are negative.  Physical Exam: Blood pressure 123/76, pulse 88, height 5\' 2"  (1.575 m), weight 143 lb 12.8 oz (65.227 kg). General: Well developed, well nourished, in no acute distress.  Head: Normocephalic, atraumatic, sclera non-icteric, mucus membranes are moist  Neck: Supple. Negative for carotid bruits. JVD not elevated.  Lungs: Clear bilaterally to auscultation without wheezes, rales, or rhonchi. Breathing is unlabored.  Heart: RRR with S1 S2. No murmurs, rubs, or gallops appreciated.  Abdomen: Soft, non-tender, non-distended with normoactive bowel sounds. No hepatomegaly. No rebound/guarding. No obvious abdominal masses.  Msk:  Strength and tone appear normal for age.  Extremities: No clubbing or cyanosis. No edema.  Distal pedal pulses are 2+ and equal bilaterally.  Neuro: Alert and oriented X 3. Moves all extremities spontaneously.  Psych:  Responds to questions appropriately with a normal affect.  ECG: Normal sinus rhythm. Normal EKG.  Assessment / Plan:

## 2011-11-07 NOTE — Assessment & Plan Note (Signed)
She's not had any further episodes of syncope. Has a pacemaker in place.

## 2011-11-07 NOTE — Assessment & Plan Note (Signed)
The patient has not had any further episodes of atrial fibrillation. She seems to be doing very well.

## 2011-11-07 NOTE — Patient Instructions (Signed)
Your physician wants you to follow-up in: 1 YEAR  You will receive a reminder letter in the mail two months in advance. If you don't receive a letter, please call our office to schedule the follow-up appointment.   Your physician recommends that you return for a FASTING lipid profile: 1 YEAR   

## 2011-11-12 DIAGNOSIS — I4891 Unspecified atrial fibrillation: Secondary | ICD-10-CM | POA: Diagnosis not present

## 2011-11-12 DIAGNOSIS — I10 Essential (primary) hypertension: Secondary | ICD-10-CM | POA: Diagnosis not present

## 2011-11-12 DIAGNOSIS — E119 Type 2 diabetes mellitus without complications: Secondary | ICD-10-CM | POA: Diagnosis not present

## 2011-11-12 DIAGNOSIS — K219 Gastro-esophageal reflux disease without esophagitis: Secondary | ICD-10-CM | POA: Diagnosis not present

## 2011-11-12 DIAGNOSIS — J329 Chronic sinusitis, unspecified: Secondary | ICD-10-CM | POA: Diagnosis not present

## 2011-11-12 DIAGNOSIS — E039 Hypothyroidism, unspecified: Secondary | ICD-10-CM | POA: Diagnosis not present

## 2011-11-12 DIAGNOSIS — E782 Mixed hyperlipidemia: Secondary | ICD-10-CM | POA: Diagnosis not present

## 2011-11-12 DIAGNOSIS — M899 Disorder of bone, unspecified: Secondary | ICD-10-CM | POA: Diagnosis not present

## 2011-12-10 ENCOUNTER — Other Ambulatory Visit: Payer: Self-pay | Admitting: *Deleted

## 2011-12-10 ENCOUNTER — Other Ambulatory Visit: Payer: Self-pay | Admitting: Cardiovascular Disease

## 2011-12-24 ENCOUNTER — Encounter: Payer: Self-pay | Admitting: Internal Medicine

## 2011-12-24 ENCOUNTER — Ambulatory Visit (INDEPENDENT_AMBULATORY_CARE_PROVIDER_SITE_OTHER): Payer: Medicare Other | Admitting: Internal Medicine

## 2011-12-24 VITALS — BP 106/74 | HR 76 | Ht 62.0 in | Wt 140.1 lb

## 2011-12-24 DIAGNOSIS — I4891 Unspecified atrial fibrillation: Secondary | ICD-10-CM

## 2011-12-24 DIAGNOSIS — I495 Sick sinus syndrome: Secondary | ICD-10-CM | POA: Diagnosis not present

## 2011-12-24 DIAGNOSIS — Z950 Presence of cardiac pacemaker: Secondary | ICD-10-CM

## 2011-12-24 DIAGNOSIS — I10 Essential (primary) hypertension: Secondary | ICD-10-CM | POA: Insufficient documentation

## 2011-12-24 LAB — PACEMAKER DEVICE OBSERVATION
AL AMPLITUDE: 3.344 mv
AL THRESHOLD: 0.5 V
BAMS-0001: 170 {beats}/min
RV LEAD AMPLITUDE: 11.648 mv
RV LEAD IMPEDENCE PM: 552 Ohm
RV LEAD THRESHOLD: 0.5 V
VENTRICULAR PACING PM: 1.62

## 2011-12-24 NOTE — Patient Instructions (Signed)
Your physician has recommended that you have a pacemaker generator change. Please call Julian Askin, Dr. Odessa Fleming nurse when you decide which date you would like to go. [phone]- you may leave a voice mail. - 4/3, 4/4/, 4/5, 4/10 are possible dates. - you will need to arrive 2 hours in advance of your procedure and some one will need to drive you home.  Your physician has recommended you make the following change in your medication:  1) Decrease Aspirin to 81 mg once daily.  Discuss taking both spironolactone and triamterene/hct with Dr. Donette Larry.

## 2011-12-24 NOTE — Assessment & Plan Note (Signed)
Blood pressure is well perhaps too well controlled. I have asked her to review with her primary care physician the need for to potassium sparing diuretics

## 2011-12-24 NOTE — Assessment & Plan Note (Signed)
As above.

## 2011-12-24 NOTE — Assessment & Plan Note (Signed)
No atrial fibrillation. Her CHADS-VASc score is 3-4. In the event that she has documented atrial fibrillation DR device she would need to go from aspirin to oral anticoagulation

## 2011-12-24 NOTE — Assessment & Plan Note (Signed)
Device has reached ERI.   We have reviewed the benefits and risks of generator replacement.  These include but are not limited to lead fracture and infection.  The patient understands, agrees and is willing to proceed.    

## 2011-12-24 NOTE — Progress Notes (Signed)
HPI  Heidi [name] is a 71 y.o. female Seen at her pacemaker has reached ERI. It was implanted in 2007 for tachybradycardia syndrome. Just prior that she presented with chest pain and underwent catheterization demonstrating nonobstructive disease and normal left ventricular function.  She has a history of paroxysmal atrial fibrillation and for thromboembolic risk profile is notable for age hypertension and gender; she is currently not on anticoagulation apart from aspirin  She does have treated obstructive sleep apnea.  The patient denies chest pain, shortness of breath, nocturnal dyspnea, orthopnea or peripheral edema.  There have been no palpitations, lightheadedness or syncope.       Past Medical History  Diagnosis Date  . Seasonal allergies   . HTN (hypertension)   . Hyperlipidemia   . Major depression     10-17-2010: hospitalized for suicidal, [facility] (D/C 10-30-2010)  . Peripheral neuropathy   . GERD (gastroesophageal reflux disease)   . Sleep apnea   . Glaucoma     Narrow Angle  . Migraine headache   . Hypothyroid   . Atrial fibrillation     Intermittent  . CAD (coronary artery disease)     non obstructive  . 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 Appendectomy   . Abdominal hysterectomy 1976  . Tumor removal 2000    Fibroid from breast x4-most recent  . Cardiac catheterization 02-12-2006    Minor irregularities: RCA-mid 40%, LM-normal, LAD-normal. EF 65%.  . Pacemaker insertion     Permanent. Medtronic EnRhyth 04/2006, set as DDDR  . Abdominal adhesion surgery 2008    exploratory to remove attached to the abdominal wall, stomach and intestines.   . Wrist fracture surgery 11-2007    right, had metal plate inserted  . Ectopic pregnancy surgery     treatment x3, last one 11-02-2009 at [facility]    Current Outpatient Prescriptions  Medication Sig Dispense Refill  . aspirin 325 MG  buffered tablet Take 325 mg by mouth daily.        Marland Kitchen azelastine (ASTELIN) 137 MCG/SPRAY nasal spray Place 2 sprays into the nose 2 (two) times daily as needed. Use in each nostril as directed      . bimatoprost (LUMIGAN) 0.01 % SOLN Place 1 drop into both eyes at bedtime.        . brimonidine-timolol (COMBIGAN) 0.2-0.5 % ophthalmic solution Place 1 drop into both eyes every 12 (twelve) hours.        . cetirizine (ZYRTEC) 10 MG tablet Take 10 mg by mouth daily.        . Cholecalciferol (VITAMIN D) 2000 UNITS tablet Take 2,000 Units by mouth daily.        . clarithromycin (BIAXIN) 500 MG tablet Take 500 mg by mouth 2 (two) times daily.      Marland Kitchen docusate sodium (COLACE) 100 MG capsule Take 100 mg by mouth daily.        . dorzolamide-timolol (COSOPT) 22.3-6.8 MG/ML ophthalmic solution Place 1 drop into both eyes every 12 (twelve) hours.      . fenofibrate (TRICOR) 145 MG tablet Take 145 mg by mouth daily.        . fluticasone (VERAMYST) 27.5 MCG/SPRAY nasal spray Place 1 spray into the nose 2 (two) times daily as needed.       . gabapentin (NEURONTIN) 300 MG capsule Take 300 mg by mouth 3 (three) times daily.        Marland Kitchen  hydrOXYzine (ATARAX/VISTARIL) 25 MG tablet Take 25 mg by mouth 2 (two) times daily.      Marland Kitchen levothyroxine (SYNTHROID, LEVOTHROID) 75 MCG tablet Take 75 mcg by mouth daily.        . metoprolol tartrate (LOPRESSOR) 25 MG tablet Take a half tablet by mouth twice daily.  90 tablet  3  . nortriptyline (PAMELOR) 25 MG capsule Take 25 mg by mouth at bedtime.       . nortriptyline (PAMELOR) 50 MG capsule Take 100 mg by mouth at bedtime.        . Olopatadine HCl (PATADAY) 0.2 % SOLN Apply 1 drop to eye as needed.       . pantoprazole (PROTONIX) 40 MG tablet Take 40 mg by mouth daily.      . ranitidine (ZANTAC) 300 MG capsule Take 300 mg by mouth 2 (two) times daily.       Marland Kitchen spironolactone (ALDACTONE) 25 MG tablet Take 25 mg by mouth 2 (two) times daily.        Marland Kitchen triamterene-hydrochlorothiazide  (DYAZIDE) 37.5-25 MG per capsule Take 1 capsule by mouth every morning.          Allergies  Allergen Reactions  . Adhesive (Tape)   . Atorvastatin   . Crestor (Rosuvastatin Calcium)     Caused Myalgias  . Lipitor (Atorvastatin Calcium)     Causes Rash  . Nardil     Caused BP Problems  . Penicillins     Causes Rash  . Plavix (Clopidogrel Bisulfate)     Causes Rash  . Simvastatin     Causes Rash  . Sulfonamide Derivatives     Causes Swelling  . Zestril (Lisinopril)     Causes Rash    Review of Systems negative except from HPI and PMH  Physical Exam BP 106/74  Pulse 76  Ht 5\' 2"  (1.575 m)  Wt 140 lb 1.9 oz (63.558 kg)  BMI 25.63 kg/m2 Alert and oriented in no acute distress HENT- normal Eyes- EOMI, without scleral icterus Skin- warm and dry; without rashes LN-neg dEVICE POcket well healed on right Neck- supple without thyromegaly, JVP-flat, carotids brisk and full without bruits Back-without CVAT or kyphosis Lungs-clear to auscultation CV-Regular rate and rhythm, nl S1 and S2, no murmurs gallops or rubs, S4-absent Abd-soft with active bowel sounds; no midline pulsation or hepatomegaly Pulses-intact femoral and distal MKS-without gross deformity Neuro- Ax O, CN3-12 intact, grossly normal motor and sensory function Affect engaging  Chest x-ray 2010-11 reviewed for orientation pacemaker-normal  Assessment and  Plan

## 2011-12-27 ENCOUNTER — Encounter: Payer: Self-pay | Admitting: *Deleted

## 2011-12-27 ENCOUNTER — Telehealth: Payer: Self-pay | Admitting: Cardiovascular Disease

## 2011-12-27 DIAGNOSIS — I4891 Unspecified atrial fibrillation: Secondary | ICD-10-CM

## 2011-12-27 DIAGNOSIS — I495 Sick sinus syndrome: Secondary | ICD-10-CM

## 2011-12-27 NOTE — Telephone Encounter (Signed)
I spoke with the patient. She is scheduled for her PPM generator change on 01/15/12. I will mail a copy of her instructions to her. She will come on 01/06/12 for her pre-procedure labs.

## 2011-12-27 NOTE — Telephone Encounter (Signed)
Please return call to patient at [phone]  Patient would like to discuss the replacement of her pace maker, she can be reached at [phone]

## 2011-12-27 NOTE — Telephone Encounter (Signed)
Pt returning call to heather/ dr Graciela Husbands re pacer, note forwarded.

## 2011-12-30 ENCOUNTER — Other Ambulatory Visit: Payer: Self-pay | Admitting: Internal Medicine

## 2011-12-30 DIAGNOSIS — Z950 Presence of cardiac pacemaker: Secondary | ICD-10-CM

## 2011-12-30 DIAGNOSIS — F3342 Major depressive disorder, recurrent, in full remission: Secondary | ICD-10-CM | POA: Diagnosis not present

## 2011-12-30 DIAGNOSIS — Z01810 Encounter for preprocedural cardiovascular examination: Secondary | ICD-10-CM | POA: Diagnosis not present

## 2011-12-31 ENCOUNTER — Encounter (HOSPITAL_COMMUNITY): Payer: Self-pay | Admitting: Pharmacy Technician

## 2012-01-06 ENCOUNTER — Other Ambulatory Visit (INDEPENDENT_AMBULATORY_CARE_PROVIDER_SITE_OTHER): Payer: Medicare Other

## 2012-01-06 DIAGNOSIS — I4891 Unspecified atrial fibrillation: Secondary | ICD-10-CM

## 2012-01-06 DIAGNOSIS — I495 Sick sinus syndrome: Secondary | ICD-10-CM | POA: Diagnosis not present

## 2012-01-06 LAB — CBC WITH DIFFERENTIAL/PLATELET
Basophils Relative: 0.6 % (ref 0.0–3.0)
Eosinophils Absolute: 0 10*3/uL (ref 0.0–0.7)
Eosinophils Relative: 0.1 % (ref 0.0–5.0)
HCT: 40.7 % (ref 36.0–46.0)
Lymphs Abs: 2 10*3/uL (ref 0.7–4.0)
MCHC: 33.6 g/dL (ref 30.0–36.0)
MCV: 85.9 fl (ref 78.0–100.0)
Monocytes Absolute: 0.4 10*3/uL (ref 0.1–1.0)
RBC: 4.74 Mil/uL (ref 3.87–5.11)
WBC: 5.2 10*3/uL (ref 4.5–10.5)

## 2012-01-06 LAB — BASIC METABOLIC PANEL
BUN: 29 mg/dL — ABNORMAL HIGH (ref 6–23)
CO2: 28 mEq/L (ref 19–32)
Chloride: 103 mEq/L (ref 96–112)
Creatinine, Ser: 1.2 mg/dL (ref 0.4–1.2)
Potassium: 3.7 mEq/L (ref 3.5–5.1)

## 2012-01-09 ENCOUNTER — Telehealth: Payer: Self-pay | Admitting: Internal Medicine

## 2012-01-09 NOTE — Telephone Encounter (Signed)
I spoke with the patient. 

## 2012-01-09 NOTE — Telephone Encounter (Signed)
Fu call °Patient returning your call °

## 2012-01-14 ENCOUNTER — Other Ambulatory Visit: Payer: Self-pay | Admitting: Internal Medicine

## 2012-01-14 MED ORDER — SODIUM CHLORIDE 0.9 % IR SOLN
80.0000 mg | Status: DC
  Filled 2012-01-14 (×2): qty 2

## 2012-01-14 MED ORDER — VANCOMYCIN HCL IN DEXTROSE 1-5 GM/200ML-% IV SOLN
1000.0000 mg | INTRAVENOUS | Status: DC
  Filled 2012-01-14: qty 200

## 2012-01-15 ENCOUNTER — Encounter (HOSPITAL_COMMUNITY)
Admission: RE | Disposition: A | Discharge status: Home / Self Care | Payer: Self-pay | Source: Ambulatory Visit | Attending: Internal Medicine

## 2012-01-15 ENCOUNTER — Ambulatory Visit (HOSPITAL_COMMUNITY)
Admission: RE | Admit: 2012-01-15 | Discharge: 2012-01-15 | Disposition: A | Discharge status: Home / Self Care | Payer: Medicare Other | Source: Ambulatory Visit | Attending: Internal Medicine | Admitting: Internal Medicine

## 2012-01-15 DIAGNOSIS — I10 Essential (primary) hypertension: Secondary | ICD-10-CM | POA: Insufficient documentation

## 2012-01-15 DIAGNOSIS — G4733 Obstructive sleep apnea (adult) (pediatric): Secondary | ICD-10-CM | POA: Diagnosis not present

## 2012-01-15 DIAGNOSIS — Z950 Presence of cardiac pacemaker: Secondary | ICD-10-CM

## 2012-01-15 DIAGNOSIS — I495 Sick sinus syndrome: Secondary | ICD-10-CM | POA: Diagnosis not present

## 2012-01-15 DIAGNOSIS — E785 Hyperlipidemia, unspecified: Secondary | ICD-10-CM | POA: Diagnosis not present

## 2012-01-15 DIAGNOSIS — Z45018 Encounter for adjustment and management of other part of cardiac pacemaker: Secondary | ICD-10-CM | POA: Insufficient documentation

## 2012-01-15 HISTORY — PX: PACEMAKER GENERATOR CHANGE: SHX5481

## 2012-01-15 LAB — SURGICAL PCR SCREEN: Staphylococcus aureus: NEGATIVE

## 2012-01-15 SURGERY — PACEMAKER GENERATOR CHANGE
Anesthesia: LOCAL

## 2012-01-15 MED ORDER — CHLORHEXIDINE GLUCONATE 4 % EX LIQD: 60.0000 mL | Freq: Once | CUTANEOUS | Status: DC

## 2012-01-15 MED ORDER — SODIUM CHLORIDE 0.9 % IV SOLN
INTRAVENOUS | Status: DC
  Administered 2012-01-15: 1000 mL via INTRAVENOUS

## 2012-01-15 MED ORDER — VANCOMYCIN HCL IN DEXTROSE 1-5 GM/200ML-% IV SOLN
INTRAVENOUS | Status: AC
  Filled 2012-01-15: qty 200

## 2012-01-15 MED ORDER — ONDANSETRON HCL 4 MG/2ML IJ SOLN: 4.0000 mg | Freq: Four times a day (QID) | INTRAMUSCULAR | Status: DC | PRN

## 2012-01-15 MED ORDER — LIDOCAINE HCL (PF) 1 % IJ SOLN
INTRAMUSCULAR | Status: AC
  Filled 2012-01-15: qty 60

## 2012-01-15 MED ORDER — MUPIROCIN 2 % EX OINT
TOPICAL_OINTMENT | CUTANEOUS | Status: AC
  Administered 2012-01-15: 1 via NASAL
  Filled 2012-01-15: qty 22

## 2012-01-15 MED ORDER — SODIUM CHLORIDE 0.45 % IV SOLN: INTRAVENOUS | Status: DC

## 2012-01-15 MED ORDER — SODIUM CHLORIDE 0.9 % IV SOLN: INTRAVENOUS | Status: DC

## 2012-01-15 MED ORDER — MIDAZOLAM HCL 5 MG/5ML IJ SOLN
INTRAMUSCULAR | Status: AC
  Filled 2012-01-15: qty 5

## 2012-01-15 MED ORDER — FENTANYL CITRATE 0.05 MG/ML IJ SOLN
INTRAMUSCULAR | Status: AC
  Filled 2012-01-15: qty 2

## 2012-01-15 MED ORDER — ACETAMINOPHEN 325 MG PO TABS: 325.0000 mg | ORAL_TABLET | ORAL | Status: DC | PRN

## 2012-01-15 NOTE — H&P (View-Only) (Signed)
HPI  Heidi [name] is a 71 y.o. female Seen at her pacemaker has reached ERI. It was implanted in 2007 for tachybradycardia syndrome. Just prior that she presented with chest pain and underwent catheterization demonstrating nonobstructive disease and normal left ventricular function.  She has a history of paroxysmal atrial fibrillation and for thromboembolic risk profile is notable for age hypertension and gender; she is currently not on anticoagulation apart from aspirin  She does have treated obstructive sleep apnea.  The patient denies chest pain, shortness of breath, nocturnal dyspnea, orthopnea or peripheral edema.  There have been no palpitations, lightheadedness or syncope.       Past Medical History  Diagnosis Date  . Seasonal allergies   . HTN (hypertension)   . Hyperlipidemia   . Major depression     10-17-2010: hospitalized for suicidal, [facility] (D/C 10-30-2010)  . Peripheral neuropathy   . GERD (gastroesophageal reflux disease)   . Sleep apnea   . Glaucoma     Narrow Angle  . Migraine headache   . Hypothyroid   . Atrial fibrillation     Intermittent  . CAD (coronary artery disease)     non obstructive  . 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 Appendectomy   . Abdominal hysterectomy 1976  . Tumor removal 2000    Fibroid from breast x4-most recent  . Cardiac catheterization 02-12-2006    Minor irregularities: RCA-mid 40%, LM-normal, LAD-normal. EF 65%.  . Pacemaker insertion     Permanent. Medtronic EnRhyth 04/2006, set as DDDR  . Abdominal adhesion surgery 2008    exploratory to remove attached to the abdominal wall, stomach and intestines.   . Wrist fracture surgery 11-2007    right, had metal plate inserted  . Ectopic pregnancy surgery     treatment x3, last one 11-02-2009 at [facility]    Current Outpatient Prescriptions  Medication Sig Dispense Refill  . aspirin 325 MG  buffered tablet Take 325 mg by mouth daily.        Marland Kitchen azelastine (ASTELIN) 137 MCG/SPRAY nasal spray Place 2 sprays into the nose 2 (two) times daily as needed. Use in each nostril as directed      . bimatoprost (LUMIGAN) 0.01 % SOLN Place 1 drop into both eyes at bedtime.        . brimonidine-timolol (COMBIGAN) 0.2-0.5 % ophthalmic solution Place 1 drop into both eyes every 12 (twelve) hours.        . cetirizine (ZYRTEC) 10 MG tablet Take 10 mg by mouth daily.        . Cholecalciferol (VITAMIN D) 2000 UNITS tablet Take 2,000 Units by mouth daily.        . clarithromycin (BIAXIN) 500 MG tablet Take 500 mg by mouth 2 (two) times daily.      Marland Kitchen docusate sodium (COLACE) 100 MG capsule Take 100 mg by mouth daily.        . dorzolamide-timolol (COSOPT) 22.3-6.8 MG/ML ophthalmic solution Place 1 drop into both eyes every 12 (twelve) hours.      . fenofibrate (TRICOR) 145 MG tablet Take 145 mg by mouth daily.        . fluticasone (VERAMYST) 27.5 MCG/SPRAY nasal spray Place 1 spray into the nose 2 (two) times daily as needed.       . gabapentin (NEURONTIN) 300 MG capsule Take 300 mg by mouth 3 (three) times daily.        Marland Kitchen  hydrOXYzine (ATARAX/VISTARIL) 25 MG tablet Take 25 mg by mouth 2 (two) times daily.      Marland Kitchen levothyroxine (SYNTHROID, LEVOTHROID) 75 MCG tablet Take 75 mcg by mouth daily.        . metoprolol tartrate (LOPRESSOR) 25 MG tablet Take a half tablet by mouth twice daily.  90 tablet  3  . nortriptyline (PAMELOR) 25 MG capsule Take 25 mg by mouth at bedtime.       . nortriptyline (PAMELOR) 50 MG capsule Take 100 mg by mouth at bedtime.        . Olopatadine HCl (PATADAY) 0.2 % SOLN Apply 1 drop to eye as needed.       . pantoprazole (PROTONIX) 40 MG tablet Take 40 mg by mouth daily.      . ranitidine (ZANTAC) 300 MG capsule Take 300 mg by mouth 2 (two) times daily.       Marland Kitchen spironolactone (ALDACTONE) 25 MG tablet Take 25 mg by mouth 2 (two) times daily.        Marland Kitchen triamterene-hydrochlorothiazide  (DYAZIDE) 37.5-25 MG per capsule Take 1 capsule by mouth every morning.          Allergies  Allergen Reactions  . Adhesive (Tape)   . Atorvastatin   . Crestor (Rosuvastatin Calcium)     Caused Myalgias  . Lipitor (Atorvastatin Calcium)     Causes Rash  . Nardil     Caused BP Problems  . Penicillins     Causes Rash  . Plavix (Clopidogrel Bisulfate)     Causes Rash  . Simvastatin     Causes Rash  . Sulfonamide Derivatives     Causes Swelling  . Zestril (Lisinopril)     Causes Rash    Review of Systems negative except from HPI and PMH  Physical Exam BP 106/74  Pulse 76  Ht 5\' 2"  (1.575 m)  Wt 140 lb 1.9 oz (63.558 kg)  BMI 25.63 kg/m2 Alert and oriented in no acute distress HENT- normal Eyes- EOMI, without scleral icterus Skin- warm and dry; without rashes LN-neg dEVICE POcket well healed on right Neck- supple without thyromegaly, JVP-flat, carotids brisk and full without bruits Back-without CVAT or kyphosis Lungs-clear to auscultation CV-Regular rate and rhythm, nl S1 and S2, no murmurs gallops or rubs, S4-absent Abd-soft with active bowel sounds; no midline pulsation or hepatomegaly Pulses-intact femoral and distal MKS-without gross deformity Neuro- Ax O, CN3-12 intact, grossly normal motor and sensory function Affect engaging  Chest x-ray 2010-11 reviewed for orientation pacemaker-normal  Assessment and  Plan

## 2012-01-15 NOTE — Interval H&P Note (Signed)
History and Physical Interval Note:  01/15/2012 8:21 AM  Heidi [name]  has presented today for surgery, with the diagnosis of End of life  The various methods of treatment have been discussed with the patient and family. After consideration of risks, benefits and other options for treatment, the patient has consented to  Procedure(s) (LRB): PACEMAKER GENERATOR CHANGE (N/A) as a surgical intervention .  The patients' history has been reviewed, patient examined, no change in status, stable for surgery.  I have reviewed the patients' chart and labs.  Questions were answered to the patient's satisfaction.     Sherryl Manges

## 2012-01-15 NOTE — CV Procedure (Signed)
Preoperative diagnosis SINUS NODE DYSFUNCTION Postoperative diagnosis same Procedure: Generator replacement    Following informed consent the patient was brought to the electrophysiology laboratory in place of the fluoroscopic table in the supine position after routine prep and drape lidocaine was infiltrated in the region of the previous incision and carried down to later the device pocket using sharp dissection and electrocautery. The pocket was opened the device was freed up and was explanted.  Interrogation of the previously implanted ventricular lead guidan 4470   demonstrated an R wave of 11 millivolts., and impedance of 555ohms, and a pacing threshold of 0.5 volts at 0.5 msec.    The previously implanted atrial lead Guidant 4469 added P-wave amplitude of 3.5 illlivolts  and impedance of  462 ohms, and a pacing threshold of 0.4volts at 0.52milliseconds.  The leads were inspected. The leads were then attached to a Medtronic Addapta L pulse generator, serial number [id number].    The pocket was irrigated with antibiotic containing saline solution hemostasis was assured and the leads and the device were placed in the pocket.Surgicell placed at medial and cepalad portion of pocket The wound is then closed in 3 layers in normal fashion.  The patient tolerated the procedure without apparent complication.  Sherryl Manges

## 2012-01-15 NOTE — Discharge Instructions (Signed)
Office visit for wound check 04/22 at 10:00   Pacemaker Battery Change A pacemaker battery usually lasts 4 to 12 years. Once or twice per year, you will be asked to visit your caregiver to have a full evaluation of your pacemaker. When a battery needs to be replaced, the entire pacemaker is actually replaced so that you can benefit from new circuitry and any new features that have recently been added to pacemakers. Most often, this procedure is very simple because the leads are already in place. After giving medicine to numb the skin, your health care provider makes a cut to reopen the pocket holding the pacemaker and disconnects the old device from its leads. The leads are routinely tested at this time. If they are working okay, the new pacemaker may simply be connected to the existing leads. If there is any problem with the old lead system, it may be wise to replace the lead system while inserting the new pacemaker. There are many things that affect how long a pacemaker battery will last:  Age of the pacemaker.   Number of leads (1, 2 or 3).   Pacemaker work load. If the pacemaker is helping the heart more often, then the battery will not last as long as if the pacemaker does not need to help the heart.   Resistance of the leads. The greater the resistance, the greater the drain on the battery. This can increase as the leads get older or if one or more of the leads does not have the best contact with the heart.   Power (voltage) settings.   The health of the person's heart. If the health of the heart gets worse, then the pacemaker may have to work more often and the setting changed to accommodate these changes.  Your health care provider will be alerted to the fact that it is time to replace the battery during follow-up exams. He or she will check your pacemaker using a small table-top computer, called a programmer, and a wand. The wand is about the same size as a remote control. Your provider  puts the wand on your body in the area where the pacemaker is located. Information from the pacemaker is received about how well your heart is working and the status of the battery. It is not painful, and it usually takes just a few minutes. You will have plenty of time before the battery is fully used up to plan for replacement.  LET YOUR CAREGIVER KNOW ABOUT:   Symptoms of chest pain, trouble breathing, palpitations, lightheadedness, or feelings of an abnormal or irregular heart beat.   Allergies.   Medications taken including herbs, eye drops, over the counter medications, and creams   Use of steroids (by mouth or creams).   Possible pregnancy, if applicable.   Previous problems with anesthetics or Novocaine.   History of blood clots (thrombophlebitis).   History of bleeding or blood problems.   Surgery since your last pacemaker placement.   Other health problems.  RISKS AND COMPLICATIONS These are very uncommon but include:  Bleeding.   Bruising of the skin around where the incision was made.   Pain at the site of the incision.   Pulling apart of the skin at the incision site.   Infection.   Allergic reaction to anesthetics or medicines used during the procedure.  Diabetics may have a temporary increase in their blood sugar after any surgical procedure.  BEFORE THE PROCEDURE  Wash all of the skin around  the area of the chest where the pacemaker is located. Try to remove any loose, scaling skin. Unless advised otherwise, avoid using aspirin, ibuprofen, or naproxen for 3-4 days before the procedure. Ask your caregiver for help with any other medication adjustments before the pacemaker is replaced. Unless advised otherwise, do not eat or drink after midnight on the night before the procedure EXCEPT for drinking water and taking your medications as you normally would. AFTER THE PROCEDURE   A heart monitor and the pacemaker programmer will be used to make sure that the new  pacemaker is working properly.   You can go home after the procedure.   Your caregiver will advise you if you need to have any stitches. They will be removed 5-7 days after the procedure.  HOME CARE INSTRUCTIONS   Keep the incision clean and dry.   Unless advised otherwise, you may shower after carefully covering the incision with plastic wrap that is taped to your chest.   For the first week after the replacement, avoid stretching motions that pull at the incision site and avoid heavy exercise with the arm on the same side as the incision.   Only take over-the-counter or prescription medicines for pain, discomfort, or fever as directed by your caregiver.   Your caregiver will tell you when you will need to next test your pacemaker by telephone or when to return to the office for re-exam and/or removal of stitches, if necessary.  SEEK MEDICAL CARE IF:   You have unusual pain at the incision site that is not adequately helped by over-the-counter or prescription medicine.   There is drainage or pus from the incision site.   You develop red streaking that extends above or below the incision site.   You feel brief intermittent palpitations, lightheadedness or any symptoms that you feel might be related to your heart.  SEEK IMMEDIATE MEDICAL CARE IF:   You experience chest pain that is different than the pain at the incision site.   You experience:   Shortness of breath.   Palpitations.   Irregular heart beat.   Lightheadedness that does not go away quickly.   Fainting.   You develop a fever.   You have pain that gets worse even though you are taking pain medicine.  MAKE SURE YOU:   Understand these instructions.   Will watch your condition.   Will get help right away if you are not doing well or get worse.  Document Released: 01/01/2007 Document Revised: 09/12/2011 Document Reviewed: 04/06/2007 [facility] Patient Information 2012 [place], [place].

## 2012-01-16 ENCOUNTER — Encounter: Payer: Self-pay | Admitting: *Deleted

## 2012-01-27 ENCOUNTER — Encounter: Payer: Self-pay | Admitting: Internal Medicine

## 2012-01-27 ENCOUNTER — Ambulatory Visit (INDEPENDENT_AMBULATORY_CARE_PROVIDER_SITE_OTHER): Payer: Medicare Other | Admitting: *Deleted

## 2012-01-27 DIAGNOSIS — Z950 Presence of cardiac pacemaker: Secondary | ICD-10-CM | POA: Diagnosis not present

## 2012-01-27 DIAGNOSIS — I4891 Unspecified atrial fibrillation: Secondary | ICD-10-CM

## 2012-01-27 LAB — PACEMAKER DEVICE OBSERVATION
AL IMPEDENCE PM: 478 Ohm
ATRIAL PACING PM: 5.4
BATTERY VOLTAGE: 2.8 V
VENTRICULAR PACING PM: 0.1

## 2012-01-27 NOTE — Progress Notes (Signed)
Patient presents for device clinic pacemaker wound check.  No problems with shortness of breath, chest pain, palpitations, or syncope.  Device interrogated and found to be functioning normally.  No changes made today.  See PaceArt report for full details.  Plan ROV with Dr. Graciela Husbands in 3 months.  Gypsy Balsam, RN, BSN 01/27/2012 10:49 AM

## 2012-02-08 ENCOUNTER — Other Ambulatory Visit: Payer: Self-pay | Admitting: Cardiovascular Disease

## 2012-02-12 DIAGNOSIS — H409 Unspecified glaucoma: Secondary | ICD-10-CM | POA: Diagnosis not present

## 2012-02-12 DIAGNOSIS — H401490 Capsular glaucoma with pseudoexfoliation of lens, unspecified eye, stage unspecified: Secondary | ICD-10-CM | POA: Diagnosis not present

## 2012-02-20 DIAGNOSIS — R609 Edema, unspecified: Secondary | ICD-10-CM | POA: Diagnosis not present

## 2012-02-20 DIAGNOSIS — L509 Urticaria, unspecified: Secondary | ICD-10-CM | POA: Diagnosis not present

## 2012-02-20 DIAGNOSIS — K130 Diseases of lips: Secondary | ICD-10-CM | POA: Diagnosis not present

## 2012-02-27 DIAGNOSIS — J209 Acute bronchitis, unspecified: Secondary | ICD-10-CM | POA: Diagnosis not present

## 2012-02-27 DIAGNOSIS — L509 Urticaria, unspecified: Secondary | ICD-10-CM | POA: Diagnosis not present

## 2012-03-16 DIAGNOSIS — F3342 Major depressive disorder, recurrent, in full remission: Secondary | ICD-10-CM | POA: Diagnosis not present

## 2012-04-16 DIAGNOSIS — R059 Cough, unspecified: Secondary | ICD-10-CM | POA: Diagnosis not present

## 2012-04-16 DIAGNOSIS — R05 Cough: Secondary | ICD-10-CM | POA: Diagnosis not present

## 2012-04-20 DIAGNOSIS — H409 Unspecified glaucoma: Secondary | ICD-10-CM | POA: Diagnosis not present

## 2012-04-20 DIAGNOSIS — H401490 Capsular glaucoma with pseudoexfoliation of lens, unspecified eye, stage unspecified: Secondary | ICD-10-CM | POA: Diagnosis not present

## 2012-04-27 DIAGNOSIS — H401490 Capsular glaucoma with pseudoexfoliation of lens, unspecified eye, stage unspecified: Secondary | ICD-10-CM | POA: Diagnosis not present

## 2012-04-27 DIAGNOSIS — H409 Unspecified glaucoma: Secondary | ICD-10-CM | POA: Diagnosis not present

## 2012-04-28 ENCOUNTER — Encounter: Payer: Self-pay | Admitting: Internal Medicine

## 2012-04-28 ENCOUNTER — Ambulatory Visit (INDEPENDENT_AMBULATORY_CARE_PROVIDER_SITE_OTHER): Payer: Medicare Other | Admitting: Internal Medicine

## 2012-04-28 VITALS — BP 112/86 | HR 86 | Ht 62.0 in | Wt 146.4 lb

## 2012-04-28 DIAGNOSIS — I4891 Unspecified atrial fibrillation: Secondary | ICD-10-CM

## 2012-04-28 DIAGNOSIS — I495 Sick sinus syndrome: Secondary | ICD-10-CM

## 2012-04-28 DIAGNOSIS — Z950 Presence of cardiac pacemaker: Secondary | ICD-10-CM

## 2012-04-28 DIAGNOSIS — T7840XA Allergy, unspecified, initial encounter: Secondary | ICD-10-CM | POA: Insufficient documentation

## 2012-04-28 LAB — PACEMAKER DEVICE OBSERVATION
AL THRESHOLD: 0.375 V
ATRIAL PACING PM: 5
BAMS-0001: 150 {beats}/min
RV LEAD THRESHOLD: 0.5 V
VENTRICULAR PACING PM: 0

## 2012-04-28 NOTE — Assessment & Plan Note (Signed)
Infrequent ventricular or atrial pacing (less than 5%)

## 2012-04-28 NOTE — Assessment & Plan Note (Signed)
The patient's device was interrogated.  The information was reviewed. No changes were made in the programming.    

## 2012-04-28 NOTE — Progress Notes (Signed)
HPI  Heidi [name] is a 71 y.o. 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  She has paroxysmal atrial fibrillation with a CHADS-VASc score of 3  At the time of generator replacement she does developed an allergic reaction with hives that persisted for number of weeks on and off.  Past Medical History  Diagnosis Date  . Seasonal allergies   . HTN (hypertension)   . Hyperlipidemia   . Major depression     10-17-2010: hospitalized for suicidal, [facility] (D/C 10-30-2010)  . Peripheral neuropathy   . GERD (gastroesophageal reflux disease)   . Sleep apnea   . Glaucoma     Narrow Angle  . Migraine headache   . Hypothyroid   . Atrial fibrillation     Intermittent  . CAD (coronary artery disease)     non obstructive  . 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 Appendectomy   . Abdominal hysterectomy 1976  . Tumor removal 2000    Fibroid from breast x4-most recent  . Cardiac catheterization 02-12-2006    Minor irregularities: RCA-mid 40%, LM-normal, LAD-normal. EF 65%.  . Pacemaker insertion     Permanent. Medtronic EnRhyth 04/2006, set as DDDR  . Abdominal adhesion surgery 2008    exploratory to remove attached to the abdominal wall, stomach and intestines.   . Wrist fracture surgery 11-2007    right, had metal plate inserted  . Ectopic pregnancy surgery     treatment x3, last one 11-02-2009 at [facility]  . Cholecystectomy   . Breast surgery     fibroids removed, benign  . Goiter   . Goiter removed     Current Outpatient Prescriptions  Medication Sig Dispense Refill  . aspirin EC 81 MG tablet Take 1 tablet (81 mg total) by mouth daily.      . bimatoprost (LUMIGAN) 0.03 % ophthalmic solution Place 1 drop into both eyes at bedtime.      . cetirizine (ZYRTEC) 10 MG tablet Take 10 mg by mouth daily.         . Cholecalciferol (VITAMIN D) 2000 UNITS tablet Take 2,000 Units by mouth daily.        Marland Kitchen docusate sodium (COLACE) 100 MG capsule Take 100 mg by mouth daily.        . dorzolamide-timolol (COSOPT) 22.3-6.8 MG/ML ophthalmic solution Place 1 drop into both eyes 2 (two) times daily.       . fenofibrate (TRICOR) 145 MG tablet Take 145 mg by mouth at bedtime.       . fluticasone (FLONASE) 50 MCG/ACT nasal spray Place 2 sprays into the nose daily as needed. For allergies      . gabapentin (NEURONTIN) 300 MG capsule Take 300 mg by mouth 3 (three) times daily.        . hydrOXYzine (VISTARIL) 25 MG capsule Take 25 mg by mouth at bedtime.      Marland Kitchen levothyroxine (SYNTHROID, LEVOTHROID) 75 MCG tablet Take 75 mcg by mouth daily.        . metoprolol tartrate (LOPRESSOR) 25 MG tablet Take 12.5 mg by mouth 2 (two) times daily. Take a half tablet by mouth twice daily.      . nortriptyline (PAMELOR) 50 MG capsule Take 125 mg by mouth at bedtime.       . pantoprazole (PROTONIX) 40 MG tablet  Take 40 mg by mouth daily.      . ranitidine (ZANTAC) 300 MG capsule Take 300 mg by mouth 2 (two) times daily.       Marland Kitchen spironolactone (ALDACTONE) 25 MG tablet Take 25 mg by mouth 2 (two) times daily.        Marland Kitchen DISCONTD: azelastine (ASTELIN) 137 MCG/SPRAY nasal spray Place 2 sprays into the nose 2 (two) times daily as needed. Use in each nostril as directed      . DISCONTD: fluticasone (VERAMYST) 27.5 MCG/SPRAY nasal spray Place 1 spray into the nose 2 (two) times daily as needed.       Marland Kitchen DISCONTD: metoprolol tartrate (LOPRESSOR) 25 MG tablet TAKE HALF TABLET BY MOUTH TWICE DAILY  60 tablet  6  . DISCONTD: nortriptyline (PAMELOR) 25 MG capsule Take 50 mg by mouth 2 (two) times daily.         Allergies  Allergen Reactions  . Crestor (Rosuvastatin Calcium)     Caused Myalgias  . Effexor (Venlafaxine Hydrochloride) Other (See Comments)    Caused PB to go up  . Lipitor (Atorvastatin Calcium)     Causes Rash  . Nardil      Caused BP Problems  . Penicillins     Swelling  . Plavix (Clopidogrel Bisulfate)     Causes Rash  . Simvastatin     Causes Rash  . Sulfonamide Derivatives     Rash  . Zestril (Lisinopril)     Causes Rash  . Adhesive (Tape) Rash  . Atorvastatin Rash    Review of Systems negative except from HPI and PMH  Physical Exam BP 112/86  Pulse 86  Ht 5\' 2"  (1.575 m)  Wt 146 lb 6.4 oz (66.407 kg)  BMI 26.78 kg/m2 Well developed and well nourished in no acute distress HENT normal E scleral and icterus clear Neck Supple Device pocket well healed; without hematoma or erythema JVP flat; carotids brisk and full Clear to ausculation regular rate and rhythm, no murmurs gallops or rub Soft with active bowel sounds No clubbing cyanosis none Edema Alert and oriented, grossly normal motor and sensory function Skin Warm and Dry    Assessment and  Plan

## 2012-04-28 NOTE — Assessment & Plan Note (Signed)
No documented atrial fibrillation on her device

## 2012-05-11 DIAGNOSIS — K219 Gastro-esophageal reflux disease without esophagitis: Secondary | ICD-10-CM | POA: Diagnosis not present

## 2012-05-11 DIAGNOSIS — I10 Essential (primary) hypertension: Secondary | ICD-10-CM | POA: Diagnosis not present

## 2012-05-11 DIAGNOSIS — Z1331 Encounter for screening for depression: Secondary | ICD-10-CM | POA: Diagnosis not present

## 2012-05-11 DIAGNOSIS — F329 Major depressive disorder, single episode, unspecified: Secondary | ICD-10-CM | POA: Diagnosis not present

## 2012-05-11 DIAGNOSIS — E039 Hypothyroidism, unspecified: Secondary | ICD-10-CM | POA: Diagnosis not present

## 2012-05-11 DIAGNOSIS — E782 Mixed hyperlipidemia: Secondary | ICD-10-CM | POA: Diagnosis not present

## 2012-05-11 DIAGNOSIS — E119 Type 2 diabetes mellitus without complications: Secondary | ICD-10-CM | POA: Diagnosis not present

## 2012-05-11 DIAGNOSIS — Z0000 Encounter for general adult medical examination without abnormal findings: Secondary | ICD-10-CM | POA: Diagnosis not present

## 2012-05-18 DIAGNOSIS — H409 Unspecified glaucoma: Secondary | ICD-10-CM | POA: Diagnosis not present

## 2012-05-18 DIAGNOSIS — IMO0002 Reserved for concepts with insufficient information to code with codable children: Secondary | ICD-10-CM | POA: Diagnosis not present

## 2012-05-18 DIAGNOSIS — H401490 Capsular glaucoma with pseudoexfoliation of lens, unspecified eye, stage unspecified: Secondary | ICD-10-CM | POA: Diagnosis not present

## 2012-06-03 DIAGNOSIS — I4891 Unspecified atrial fibrillation: Secondary | ICD-10-CM | POA: Diagnosis not present

## 2012-06-03 DIAGNOSIS — E782 Mixed hyperlipidemia: Secondary | ICD-10-CM | POA: Diagnosis not present

## 2012-06-03 DIAGNOSIS — I2510 Atherosclerotic heart disease of native coronary artery without angina pectoris: Secondary | ICD-10-CM | POA: Diagnosis not present

## 2012-06-03 DIAGNOSIS — Z950 Presence of cardiac pacemaker: Secondary | ICD-10-CM | POA: Diagnosis not present

## 2012-06-11 DIAGNOSIS — H2510 Age-related nuclear cataract, unspecified eye: Secondary | ICD-10-CM | POA: Diagnosis not present

## 2012-06-18 DIAGNOSIS — H4011X0 Primary open-angle glaucoma, stage unspecified: Secondary | ICD-10-CM | POA: Diagnosis not present

## 2012-06-28 DIAGNOSIS — L2089 Other atopic dermatitis: Secondary | ICD-10-CM | POA: Diagnosis not present

## 2012-07-29 ENCOUNTER — Other Ambulatory Visit: Payer: Self-pay | Admitting: Gastroenterology

## 2012-07-29 DIAGNOSIS — Z09 Encounter for follow-up examination after completed treatment for conditions other than malignant neoplasm: Secondary | ICD-10-CM | POA: Diagnosis not present

## 2012-07-29 DIAGNOSIS — D126 Benign neoplasm of colon, unspecified: Secondary | ICD-10-CM | POA: Diagnosis not present

## 2012-07-29 DIAGNOSIS — Z86010 Personal history of colonic polyps: Secondary | ICD-10-CM | POA: Diagnosis not present

## 2012-08-02 DIAGNOSIS — H1030 Unspecified acute conjunctivitis, unspecified eye: Secondary | ICD-10-CM | POA: Diagnosis not present

## 2012-08-02 DIAGNOSIS — J019 Acute sinusitis, unspecified: Secondary | ICD-10-CM | POA: Diagnosis not present

## 2012-08-07 DIAGNOSIS — Z950 Presence of cardiac pacemaker: Secondary | ICD-10-CM | POA: Diagnosis not present

## 2012-08-24 DIAGNOSIS — F329 Major depressive disorder, single episode, unspecified: Secondary | ICD-10-CM | POA: Diagnosis not present

## 2012-08-25 DIAGNOSIS — IMO0002 Reserved for concepts with insufficient information to code with codable children: Secondary | ICD-10-CM | POA: Diagnosis not present

## 2012-08-25 DIAGNOSIS — Z23 Encounter for immunization: Secondary | ICD-10-CM | POA: Diagnosis not present

## 2012-09-02 DIAGNOSIS — R079 Chest pain, unspecified: Secondary | ICD-10-CM | POA: Diagnosis not present

## 2012-09-02 DIAGNOSIS — I10 Essential (primary) hypertension: Secondary | ICD-10-CM | POA: Diagnosis not present

## 2012-09-14 DIAGNOSIS — H401490 Capsular glaucoma with pseudoexfoliation of lens, unspecified eye, stage unspecified: Secondary | ICD-10-CM | POA: Diagnosis not present

## 2012-10-08 DIAGNOSIS — H401490 Capsular glaucoma with pseudoexfoliation of lens, unspecified eye, stage unspecified: Secondary | ICD-10-CM | POA: Diagnosis not present

## 2012-11-10 DIAGNOSIS — Z950 Presence of cardiac pacemaker: Secondary | ICD-10-CM | POA: Diagnosis not present

## 2012-11-11 ENCOUNTER — Other Ambulatory Visit: Payer: Self-pay | Admitting: Internal Medicine

## 2012-11-11 DIAGNOSIS — M899 Disorder of bone, unspecified: Secondary | ICD-10-CM | POA: Diagnosis not present

## 2012-11-11 DIAGNOSIS — F411 Generalized anxiety disorder: Secondary | ICD-10-CM | POA: Diagnosis not present

## 2012-11-11 DIAGNOSIS — M949 Disorder of cartilage, unspecified: Secondary | ICD-10-CM | POA: Diagnosis not present

## 2012-11-11 DIAGNOSIS — I10 Essential (primary) hypertension: Secondary | ICD-10-CM | POA: Diagnosis not present

## 2012-11-11 DIAGNOSIS — K219 Gastro-esophageal reflux disease without esophagitis: Secondary | ICD-10-CM | POA: Diagnosis not present

## 2012-11-11 DIAGNOSIS — E119 Type 2 diabetes mellitus without complications: Secondary | ICD-10-CM | POA: Diagnosis not present

## 2012-11-11 DIAGNOSIS — N183 Chronic kidney disease, stage 3 unspecified: Secondary | ICD-10-CM | POA: Diagnosis not present

## 2012-11-11 DIAGNOSIS — Z1231 Encounter for screening mammogram for malignant neoplasm of breast: Secondary | ICD-10-CM

## 2012-11-11 DIAGNOSIS — E782 Mixed hyperlipidemia: Secondary | ICD-10-CM | POA: Diagnosis not present

## 2012-11-11 DIAGNOSIS — E039 Hypothyroidism, unspecified: Secondary | ICD-10-CM | POA: Diagnosis not present

## 2012-11-16 DIAGNOSIS — H401490 Capsular glaucoma with pseudoexfoliation of lens, unspecified eye, stage unspecified: Secondary | ICD-10-CM | POA: Diagnosis not present

## 2012-12-09 DIAGNOSIS — I2510 Atherosclerotic heart disease of native coronary artery without angina pectoris: Secondary | ICD-10-CM | POA: Diagnosis not present

## 2012-12-09 DIAGNOSIS — I4891 Unspecified atrial fibrillation: Secondary | ICD-10-CM | POA: Diagnosis not present

## 2012-12-09 DIAGNOSIS — E782 Mixed hyperlipidemia: Secondary | ICD-10-CM | POA: Diagnosis not present

## 2012-12-09 DIAGNOSIS — I10 Essential (primary) hypertension: Secondary | ICD-10-CM | POA: Diagnosis not present

## 2012-12-15 ENCOUNTER — Ambulatory Visit
Admission: RE | Admit: 2012-12-15 | Discharge: 2012-12-15 | Disposition: A | Discharge status: Home / Self Care | Payer: Medicare Other | Source: Ambulatory Visit | Attending: Internal Medicine | Admitting: Internal Medicine

## 2012-12-15 DIAGNOSIS — Z1231 Encounter for screening mammogram for malignant neoplasm of breast: Secondary | ICD-10-CM

## 2012-12-28 DIAGNOSIS — M899 Disorder of bone, unspecified: Secondary | ICD-10-CM | POA: Diagnosis not present

## 2013-02-15 DIAGNOSIS — H2510 Age-related nuclear cataract, unspecified eye: Secondary | ICD-10-CM | POA: Diagnosis not present

## 2013-02-15 DIAGNOSIS — H401490 Capsular glaucoma with pseudoexfoliation of lens, unspecified eye, stage unspecified: Secondary | ICD-10-CM | POA: Diagnosis not present

## 2013-02-22 DIAGNOSIS — F339 Major depressive disorder, recurrent, unspecified: Secondary | ICD-10-CM | POA: Diagnosis not present

## 2013-02-26 ENCOUNTER — Other Ambulatory Visit: Payer: Self-pay | Admitting: *Deleted

## 2013-02-26 MED ORDER — METOPROLOL TARTRATE 25 MG PO TABS: ORAL_TABLET | ORAL | Status: DC

## 2013-02-26 NOTE — Telephone Encounter (Signed)
NEED APPOINTMENT Fax Received. Refill Completed. Heidi [name] (R.M.A)   

## 2013-03-09 DIAGNOSIS — I4891 Unspecified atrial fibrillation: Secondary | ICD-10-CM | POA: Diagnosis not present

## 2013-03-09 DIAGNOSIS — Z950 Presence of cardiac pacemaker: Secondary | ICD-10-CM | POA: Diagnosis not present

## 2013-03-22 DIAGNOSIS — F329 Major depressive disorder, single episode, unspecified: Secondary | ICD-10-CM | POA: Diagnosis not present

## 2013-03-22 DIAGNOSIS — F3289 Other specified depressive episodes: Secondary | ICD-10-CM | POA: Diagnosis not present

## 2013-05-24 DIAGNOSIS — IMO0002 Reserved for concepts with insufficient information to code with codable children: Secondary | ICD-10-CM | POA: Diagnosis not present

## 2013-05-26 DIAGNOSIS — I10 Essential (primary) hypertension: Secondary | ICD-10-CM | POA: Diagnosis not present

## 2013-05-26 DIAGNOSIS — K219 Gastro-esophageal reflux disease without esophagitis: Secondary | ICD-10-CM | POA: Diagnosis not present

## 2013-05-26 DIAGNOSIS — H8110 Benign paroxysmal vertigo, unspecified ear: Secondary | ICD-10-CM | POA: Diagnosis not present

## 2013-05-26 DIAGNOSIS — N183 Chronic kidney disease, stage 3 unspecified: Secondary | ICD-10-CM | POA: Diagnosis not present

## 2013-05-26 DIAGNOSIS — Z1331 Encounter for screening for depression: Secondary | ICD-10-CM | POA: Diagnosis not present

## 2013-05-26 DIAGNOSIS — E119 Type 2 diabetes mellitus without complications: Secondary | ICD-10-CM | POA: Diagnosis not present

## 2013-05-26 DIAGNOSIS — E039 Hypothyroidism, unspecified: Secondary | ICD-10-CM | POA: Diagnosis not present

## 2013-05-26 DIAGNOSIS — E782 Mixed hyperlipidemia: Secondary | ICD-10-CM | POA: Diagnosis not present

## 2013-06-04 DIAGNOSIS — H409 Unspecified glaucoma: Secondary | ICD-10-CM | POA: Diagnosis not present

## 2013-06-04 DIAGNOSIS — H401490 Capsular glaucoma with pseudoexfoliation of lens, unspecified eye, stage unspecified: Secondary | ICD-10-CM | POA: Diagnosis not present

## 2013-06-14 DIAGNOSIS — E119 Type 2 diabetes mellitus without complications: Secondary | ICD-10-CM | POA: Diagnosis not present

## 2013-06-14 DIAGNOSIS — I2510 Atherosclerotic heart disease of native coronary artery without angina pectoris: Secondary | ICD-10-CM | POA: Diagnosis not present

## 2013-06-14 DIAGNOSIS — I4891 Unspecified atrial fibrillation: Secondary | ICD-10-CM | POA: Diagnosis not present

## 2013-06-14 DIAGNOSIS — E782 Mixed hyperlipidemia: Secondary | ICD-10-CM | POA: Diagnosis not present

## 2013-07-05 DIAGNOSIS — H409 Unspecified glaucoma: Secondary | ICD-10-CM | POA: Diagnosis not present

## 2013-07-05 DIAGNOSIS — H2510 Age-related nuclear cataract, unspecified eye: Secondary | ICD-10-CM | POA: Diagnosis not present

## 2013-07-05 DIAGNOSIS — H401490 Capsular glaucoma with pseudoexfoliation of lens, unspecified eye, stage unspecified: Secondary | ICD-10-CM | POA: Diagnosis not present

## 2013-07-07 ENCOUNTER — Ambulatory Visit (INDEPENDENT_AMBULATORY_CARE_PROVIDER_SITE_OTHER): Payer: Medicare Other | Admitting: *Deleted

## 2013-07-07 DIAGNOSIS — I495 Sick sinus syndrome: Secondary | ICD-10-CM | POA: Diagnosis not present

## 2013-07-07 LAB — PACEMAKER DEVICE OBSERVATION
AL AMPLITUDE: 5.6 mv
AL IMPEDENCE PM: 499 Ohm
AL THRESHOLD: 0.5 V
ATRIAL PACING PM: 10
RV LEAD IMPEDENCE PM: 621 Ohm
RV LEAD THRESHOLD: 0.75 V
VENTRICULAR PACING PM: 1

## 2013-07-07 NOTE — Progress Notes (Signed)
Pacemaker check in clinic. Normal device function. Thresholds, sensing, impedances consistent with previous measurements. Device programmed to maximize longevity. No high ventricular rates noted. Device programmed at appropriate safety margins. Histogram distribution appropriate for patient activity level. Device programmed to optimize intrinsic conduction. Battery voltage 2.64 V. Patient enrolled in remote follow-up and number was given to request cell adapter. [facility] 10-08-13 and ROV in March with [facility]. 

## 2013-07-27 DIAGNOSIS — Z23 Encounter for immunization: Secondary | ICD-10-CM | POA: Diagnosis not present

## 2013-07-28 ENCOUNTER — Encounter: Payer: Self-pay | Admitting: Internal Medicine

## 2013-08-23 DIAGNOSIS — IMO0002 Reserved for concepts with insufficient information to code with codable children: Secondary | ICD-10-CM | POA: Diagnosis not present

## 2013-10-14 ENCOUNTER — Encounter: Payer: Medicare Other | Admitting: Internal Medicine

## 2013-10-31 DIAGNOSIS — J209 Acute bronchitis, unspecified: Secondary | ICD-10-CM | POA: Diagnosis not present

## 2013-12-01 ENCOUNTER — Encounter: Payer: Self-pay | Admitting: Internal Medicine

## 2013-12-01 ENCOUNTER — Ambulatory Visit (INDEPENDENT_AMBULATORY_CARE_PROVIDER_SITE_OTHER): Payer: Medicare Other | Admitting: Internal Medicine

## 2013-12-01 VITALS — BP 145/86 | HR 88 | Ht 62.0 in | Wt 153.0 lb

## 2013-12-01 DIAGNOSIS — I10 Essential (primary) hypertension: Secondary | ICD-10-CM | POA: Diagnosis not present

## 2013-12-01 DIAGNOSIS — I4891 Unspecified atrial fibrillation: Secondary | ICD-10-CM | POA: Diagnosis not present

## 2013-12-01 DIAGNOSIS — Z950 Presence of cardiac pacemaker: Secondary | ICD-10-CM

## 2013-12-01 DIAGNOSIS — I495 Sick sinus syndrome: Secondary | ICD-10-CM

## 2013-12-01 LAB — MDC_IDC_ENUM_SESS_TYPE_INCLINIC
Brady Statistic AP VS Percent: 7 %
Brady Statistic AS VP Percent: 0 %
Brady Statistic AS VS Percent: 92 %
Date Time Interrogation Session: 20150225145557
Lead Channel Impedance Value: 582 Ohm
Lead Channel Pacing Threshold Amplitude: 0.75 V
Lead Channel Pacing Threshold Amplitude: 0.75 V
Lead Channel Pacing Threshold Pulse Width: 0.4 ms
Lead Channel Pacing Threshold Pulse Width: 0.4 ms
Lead Channel Sensing Intrinsic Amplitude: 15.67 mV
Lead Channel Setting Pacing Amplitude: 2.5 V
Lead Channel Setting Sensing Sensitivity: 5.6 mV
MDC IDC MSMT BATTERY IMPEDANCE: 133 Ohm
MDC IDC MSMT BATTERY REMAINING LONGEVITY: 161 mo
MDC IDC MSMT BATTERY VOLTAGE: 2.8 V
MDC IDC MSMT LEADCHNL RA IMPEDANCE VALUE: 492 Ohm
MDC IDC MSMT LEADCHNL RA SENSING INTR AMPL: 4 mV
MDC IDC SET LEADCHNL RA PACING AMPLITUDE: 2 V
MDC IDC SET LEADCHNL RV PACING PULSEWIDTH: 0.4 ms
MDC IDC STAT BRADY AP VP PERCENT: 1 %

## 2013-12-01 NOTE — Patient Instructions (Addendum)

## 2013-12-01 NOTE — Progress Notes (Signed)
Patient Care Team: Wenda Low, MD as PCP - General (Internal Medicine)   HPI  Heidi [name] is a 73 y.o. 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  She has paroxysmal atrial fibrillation with a CHADS-VASc score of 3  She does not take anticoag      Past Medical History  Diagnosis Date  . Seasonal allergies   . HTN (hypertension)   . Hyperlipidemia   . Major depression     10-17-2010: hospitalized for suicidal, [name] (D/C 10-30-2010)  . Peripheral neuropathy   . GERD (gastroesophageal reflux disease)   . Sleep apnea   . Glaucoma     Narrow Angle  . Migraine headache   . Hypothyroid   . Atrial fibrillation     Intermittent  . CAD (coronary artery disease)     non obstructive  . Tachy-brady syndrome 04-2006    PPM placed    Past Surgical History  Procedure Laterality Date  . Appendectomy    . Abdominal hysterectomy  1976  . Tumor removal  2000    Fibroid from breast x4-most recent  . Cardiac catheterization  02-12-2006    Minor irregularities: RCA-mid 40%, LM-normal, LAD-normal. EF 65%.  . Pacemaker insertion      Permanent. Medtronic EnRhyth 04/2006, set as DDDR  . Abdominal adhesion surgery  2008    exploratory to remove attached to the abdominal wall, stomach and intestines.   . Wrist fracture surgery  11-2007    right, had metal plate inserted  . Ectopic pregnancy surgery      treatment x3, last one 11-02-2009 at [facility]  . Cholecystectomy    . Breast surgery      fibroids removed, benign  . Goiter    . Goiter removed      Current Outpatient Prescriptions  Medication Sig Dispense Refill  . aspirin EC 81 MG tablet Take 1 tablet (81 mg total) by mouth daily.      . bimatoprost (LUMIGAN) 0.03 % ophthalmic solution Place 1 drop into both eyes at bedtime.      . cetirizine (ZYRTEC) 10 MG tablet Take 10 mg by mouth  daily.        . Cholecalciferol (VITAMIN D) 2000 UNITS tablet Take 2,000 Units by mouth daily.        Marland Kitchen docusate sodium (COLACE) 100 MG capsule Take 100 mg by mouth daily.        . dorzolamide-timolol (COSOPT) 22.3-6.8 MG/ML ophthalmic solution Place 1 drop into both eyes 2 (two) times daily.       . fenofibrate (TRICOR) 145 MG tablet Take 145 mg by mouth at bedtime.       . fluticasone (FLONASE) 50 MCG/ACT nasal spray Place 2 sprays into the nose daily as needed. For allergies      . gabapentin (NEURONTIN) 300 MG capsule Take 300 mg by mouth 3 (three) times daily.        . hydrOXYzine (VISTARIL) 25 MG capsule Take 50 mg by mouth at bedtime. 2 tabs daily for a total of 50 mg      . levothyroxine (SYNTHROID, LEVOTHROID) 75 MCG tablet Take 75 mcg by mouth daily.        . meclizine (ANTIVERT) 25 MG tablet Take 25 mg by mouth 3 (three) times daily.      . metoprolol tartrate (LOPRESSOR) 25 MG tablet Take  a half tablet by mouth twice daily.  30 tablet  1  . nortriptyline (PAMELOR) 75 MG capsule Take 75 mg by mouth at bedtime. 2 tabs once for a total of 150 mg.      . omeprazole (PRILOSEC) 40 MG capsule Take 40 mg by mouth daily.      . ranitidine (ZANTAC) 300 MG capsule Take 300 mg by mouth 2 (two) times daily.       Marland Kitchen senna (SENOKOT) 8.6 MG tablet Take 1 tablet by mouth as needed for constipation.      Marland Kitchen spironolactone (ALDACTONE) 25 MG tablet Take 25 mg by mouth 2 (two) times daily.        . [DISCONTINUED] azelastine (ASTELIN) 137 MCG/SPRAY nasal spray Place 2 sprays into the nose 2 (two) times daily as needed. Use in each nostril as directed      . [DISCONTINUED] fluticasone (VERAMYST) 27.5 MCG/SPRAY nasal spray Place 1 spray into the nose 2 (two) times daily as needed.        No current facility-administered medications for this visit.    Allergies  Allergen Reactions  . Crestor [Rosuvastatin Calcium]     Caused Myalgias  . Effexor [Venlafaxine Hydrochloride] Other (See Comments)    Caused  PB to go up  . Lipitor [Atorvastatin Calcium]     Causes Rash  . Nardil     Caused BP Problems  . Penicillins     Swelling  . Plavix [Clopidogrel Bisulfate]     Causes Rash  . Simvastatin     Causes Rash  . Sulfonamide Derivatives     Rash  . Zestril [Lisinopril]     Causes Rash  . Adhesive [Tape] Rash  . Atorvastatin Rash    Review of Systems negative except from HPI and PMH  Physical Exam BP 145/86  Pulse 88  Ht 5\' 2"  (1.575 m)  Wt 153 lb (69.4 kg)  BMI 27.98 kg/m2 Well developed and well nourished in no acute distress HENT normal E scleral and icterus clear Neck Supple JVP flat; carotids brisk and full Clear to ausculation Device pocket well healed; without hematoma or erythema.  There is no tethering  regular rate and rhythm, no murmurs gallops or rub Soft with active bowel sounds No clubbing cyanosis 1+ Edema Alert and oriented, grossly normal motor and sensory function Skin Warm and Dry  ECG demonstrates sinus rhythm 90 intervals 20/10/35 Borderline left axis deviation Nonspecific ST changes  Assessment and  Plan

## 2013-12-01 NOTE — Assessment & Plan Note (Signed)
No intercurrent detected atrial fibrillation. It would be reasonable discussion to consider the use of apixaban as opposed to aspirin based on the AVERROES trial

## 2013-12-01 NOTE — Assessment & Plan Note (Signed)
WELL CONTROLLED Her Aldactone is followed by Dr. Deforest Hoyles. Blood work is anticipated next week.

## 2013-12-01 NOTE — Assessment & Plan Note (Signed)
The patient's device was interrogated.  The information was reviewed. No changes were made in the programming.    

## 2013-12-02 ENCOUNTER — Encounter: Payer: Medicare Other | Admitting: Internal Medicine

## 2013-12-17 DIAGNOSIS — Z23 Encounter for immunization: Secondary | ICD-10-CM | POA: Diagnosis not present

## 2013-12-17 DIAGNOSIS — E119 Type 2 diabetes mellitus without complications: Secondary | ICD-10-CM | POA: Diagnosis not present

## 2013-12-17 DIAGNOSIS — I10 Essential (primary) hypertension: Secondary | ICD-10-CM | POA: Diagnosis not present

## 2013-12-17 DIAGNOSIS — E039 Hypothyroidism, unspecified: Secondary | ICD-10-CM | POA: Diagnosis not present

## 2013-12-17 DIAGNOSIS — N183 Chronic kidney disease, stage 3 unspecified: Secondary | ICD-10-CM | POA: Diagnosis not present

## 2013-12-17 DIAGNOSIS — I4891 Unspecified atrial fibrillation: Secondary | ICD-10-CM | POA: Diagnosis not present

## 2013-12-17 DIAGNOSIS — E782 Mixed hyperlipidemia: Secondary | ICD-10-CM | POA: Diagnosis not present

## 2013-12-17 DIAGNOSIS — Z0000 Encounter for general adult medical examination without abnormal findings: Secondary | ICD-10-CM | POA: Diagnosis not present

## 2013-12-17 DIAGNOSIS — K219 Gastro-esophageal reflux disease without esophagitis: Secondary | ICD-10-CM | POA: Diagnosis not present

## 2013-12-17 DIAGNOSIS — F329 Major depressive disorder, single episode, unspecified: Secondary | ICD-10-CM | POA: Diagnosis not present

## 2014-02-14 DIAGNOSIS — H2510 Age-related nuclear cataract, unspecified eye: Secondary | ICD-10-CM | POA: Diagnosis not present

## 2014-02-14 DIAGNOSIS — H409 Unspecified glaucoma: Secondary | ICD-10-CM | POA: Diagnosis not present

## 2014-02-14 DIAGNOSIS — H401490 Capsular glaucoma with pseudoexfoliation of lens, unspecified eye, stage unspecified: Secondary | ICD-10-CM | POA: Diagnosis not present

## 2014-02-21 DIAGNOSIS — IMO0002 Reserved for concepts with insufficient information to code with codable children: Secondary | ICD-10-CM | POA: Diagnosis not present

## 2014-03-07 DIAGNOSIS — H4011X0 Primary open-angle glaucoma, stage unspecified: Secondary | ICD-10-CM | POA: Diagnosis not present

## 2014-03-07 DIAGNOSIS — H2510 Age-related nuclear cataract, unspecified eye: Secondary | ICD-10-CM | POA: Diagnosis not present

## 2014-03-07 DIAGNOSIS — H268 Other specified cataract: Secondary | ICD-10-CM | POA: Diagnosis not present

## 2014-03-17 DIAGNOSIS — H401490 Capsular glaucoma with pseudoexfoliation of lens, unspecified eye, stage unspecified: Secondary | ICD-10-CM | POA: Diagnosis not present

## 2014-03-17 DIAGNOSIS — H409 Unspecified glaucoma: Secondary | ICD-10-CM | POA: Diagnosis not present

## 2014-04-07 ENCOUNTER — Other Ambulatory Visit: Payer: Self-pay

## 2014-04-07 DIAGNOSIS — Z1231 Encounter for screening mammogram for malignant neoplasm of breast: Secondary | ICD-10-CM

## 2014-04-11 ENCOUNTER — Ambulatory Visit
Admission: RE | Admit: 2014-04-11 | Discharge: 2014-04-11 | Disposition: A | Discharge status: Home / Self Care | Payer: Medicare Other | Source: Ambulatory Visit

## 2014-04-11 DIAGNOSIS — Z1231 Encounter for screening mammogram for malignant neoplasm of breast: Secondary | ICD-10-CM | POA: Diagnosis not present

## 2014-04-13 ENCOUNTER — Other Ambulatory Visit: Payer: Self-pay | Admitting: Internal Medicine

## 2014-04-13 DIAGNOSIS — R928 Other abnormal and inconclusive findings on diagnostic imaging of breast: Secondary | ICD-10-CM

## 2014-04-21 ENCOUNTER — Ambulatory Visit
Admission: RE | Admit: 2014-04-21 | Discharge: 2014-04-21 | Disposition: A | Discharge status: Home / Self Care | Payer: Medicare Other | Source: Ambulatory Visit | Attending: Internal Medicine | Admitting: Internal Medicine

## 2014-04-21 DIAGNOSIS — R928 Other abnormal and inconclusive findings on diagnostic imaging of breast: Secondary | ICD-10-CM

## 2014-04-21 DIAGNOSIS — D249 Benign neoplasm of unspecified breast: Secondary | ICD-10-CM | POA: Diagnosis not present

## 2014-04-22 ENCOUNTER — Encounter: Payer: Self-pay | Admitting: Cardiology

## 2014-05-23 DIAGNOSIS — F3342 Major depressive disorder, recurrent, in full remission: Secondary | ICD-10-CM | POA: Diagnosis not present

## 2014-05-23 DIAGNOSIS — H401490 Capsular glaucoma with pseudoexfoliation of lens, unspecified eye, stage unspecified: Secondary | ICD-10-CM | POA: Diagnosis not present

## 2014-05-23 DIAGNOSIS — H2510 Age-related nuclear cataract, unspecified eye: Secondary | ICD-10-CM | POA: Diagnosis not present

## 2014-05-23 DIAGNOSIS — H409 Unspecified glaucoma: Secondary | ICD-10-CM | POA: Diagnosis not present

## 2014-05-26 DIAGNOSIS — G603 Idiopathic progressive neuropathy: Secondary | ICD-10-CM | POA: Diagnosis not present

## 2014-05-26 DIAGNOSIS — G450 Vertebro-basilar artery syndrome: Secondary | ICD-10-CM | POA: Diagnosis not present

## 2014-05-26 DIAGNOSIS — R55 Syncope and collapse: Secondary | ICD-10-CM | POA: Diagnosis not present

## 2014-06-07 DIAGNOSIS — H409 Unspecified glaucoma: Secondary | ICD-10-CM | POA: Diagnosis not present

## 2014-06-07 DIAGNOSIS — H268 Other specified cataract: Secondary | ICD-10-CM | POA: Diagnosis not present

## 2014-06-07 DIAGNOSIS — H401490 Capsular glaucoma with pseudoexfoliation of lens, unspecified eye, stage unspecified: Secondary | ICD-10-CM | POA: Diagnosis not present

## 2014-06-08 ENCOUNTER — Encounter: Payer: Self-pay | Admitting: *Deleted

## 2014-06-14 ENCOUNTER — Ambulatory Visit: Payer: Medicare Other | Admitting: Cardiology

## 2014-06-15 ENCOUNTER — Ambulatory Visit (INDEPENDENT_AMBULATORY_CARE_PROVIDER_SITE_OTHER): Payer: Medicare Other | Admitting: Cardiology

## 2014-06-15 ENCOUNTER — Encounter: Payer: Self-pay | Admitting: Cardiology

## 2014-06-15 VITALS — BP 122/84 | HR 81 | Ht 62.0 in | Wt 153.0 lb

## 2014-06-15 DIAGNOSIS — I10 Essential (primary) hypertension: Secondary | ICD-10-CM | POA: Diagnosis not present

## 2014-06-15 DIAGNOSIS — Z950 Presence of cardiac pacemaker: Secondary | ICD-10-CM

## 2014-06-15 DIAGNOSIS — I4891 Unspecified atrial fibrillation: Secondary | ICD-10-CM | POA: Diagnosis not present

## 2014-06-15 DIAGNOSIS — I480 Paroxysmal atrial fibrillation: Secondary | ICD-10-CM

## 2014-06-15 NOTE — Progress Notes (Signed)
[facility]. [address], [address] [place], [place]  [address] Phone: [phone] Fax:  [phone]  Date:  06/15/2014   ID:  Heidi [name], DOB [birth year], MRN [id number]  PCP:  Wenda Low, MD   History of Present Illness: Heidi [name] is a 73 y.o. female with hyperlipidemia, atrial fibrillation, hypertension, obstructive sleep apnea status post pacemaker placement in 2007 secondary to tachycardia/bradycardia syndrome with history of depression/ECT treatment. She has most recently seen Dr. Caryl Comes for evaluation of her pacemaker on 04/28/14. She underwent generator replacement in spring of 2013. She has a history of paroxysmal atrial fibrillation with a CHADS-VASc score of 3.   Cardiac catheterization was performed on 02/12/2006 which showed minor irregularities, mid RCA 40%, left main normal, LAD normal, ejection fraction 65%. She has infrequent ventricular rate or pacing less than 5%.   Per Dr. Olin Pia note as is stated above she has a history of paroxysmal atrial fibrillation but is not on current anticoagulation therapy.  She feels well other than vertigo-like symptoms. She enjoys going to the beach every year with her red hat women   Wt Readings from Last 3 Encounters:  06/15/14 153 lb (69.4 kg)  12/01/13 153 lb (69.4 kg)  04/28/12 146 lb 6.4 oz (66.407 kg)     Past Medical History  Diagnosis Date  . Seasonal allergies   . HTN (hypertension)   . Hyperlipidemia   . Major depression     10-17-2010: hospitalized for suicidal, [name] (D/C 10-30-2010)  . Peripheral neuropathy   . GERD (gastroesophageal reflux disease)   . Sleep apnea   . Glaucoma     Narrow Angle  . Migraine headache   . Hypothyroid   . Atrial fibrillation     Intermittent  . CAD (coronary artery disease)     non obstructive  . Tachy-brady syndrome 04-2006    PPM placed  . Diabetes mellitus without complication   . CKD (chronic kidney disease)   . OSA (obstructive sleep apnea)      Past Surgical History  Procedure Laterality Date  . Appendectomy    . Abdominal hysterectomy  1976  . Tumor removal  2000    Fibroid from breast x4-most recent  . Cardiac catheterization  02-12-2006    Minor irregularities: RCA-mid 40%, LM-normal, LAD-normal. EF 65%.  . Pacemaker insertion      Permanent. Medtronic EnRhyth 04/2006, set as DDDR  . Abdominal adhesion surgery  2008    exploratory to remove attached to the abdominal wall, stomach and intestines.   . Wrist fracture surgery  11-2007    right, had metal plate inserted  . Ectopic pregnancy surgery      treatment x3, last one 11-02-2009 at [facility]  . Cholecystectomy    . Breast surgery      fibroids removed, benign  . Goiter    . Goiter removed      Current Outpatient Prescriptions  Medication Sig Dispense Refill  . aspirin EC 81 MG tablet Take 1 tablet (81 mg total) by mouth daily.      . bimatoprost (LUMIGAN) 0.03 % ophthalmic solution Place 1 drop into both eyes at bedtime.      . cetirizine (ZYRTEC) 10 MG tablet Take 10 mg by mouth daily.        . Cholecalciferol (VITAMIN D) 2000 UNITS tablet Take 2,000 Units by mouth daily.        . fenofibrate (TRICOR) 145 MG tablet Take  145 mg by mouth at bedtime.       . fluticasone (FLONASE) 50 MCG/ACT nasal spray Place 2 sprays into the nose daily as needed. For allergies      . gabapentin (NEURONTIN) 300 MG capsule Take 300 mg by mouth 3 (three) times daily.        . hydrOXYzine (VISTARIL) 25 MG capsule Take 25 mg by mouth at bedtime. 2 tabs daily for a total of 50 mg      . levothyroxine (SYNTHROID, LEVOTHROID) 75 MCG tablet Take 75 mcg by mouth daily.        . metoprolol tartrate (LOPRESSOR) 25 MG tablet Take a half tablet by mouth twice daily.  30 tablet  1  . naproxen sodium (ANAPROX) 220 MG tablet Take 220 mg by mouth daily.      . nortriptyline (PAMELOR) 75 MG capsule Take 75 mg by mouth at bedtime. 2 tabs once for a total of 150 mg.      . omeprazole (PRILOSEC)  40 MG capsule Take 40 mg by mouth daily.      . polyethylene glycol powder (GLYCOLAX/MIRALAX) powder       . ranitidine (ZANTAC) 300 MG capsule Take 300 mg by mouth 2 (two) times daily.       Marland Kitchen spironolactone (ALDACTONE) 25 MG tablet Take 25 mg by mouth 2 (two) times daily.        . timolol (TIMOPTIC) 0.5 % ophthalmic solution       . [DISCONTINUED] azelastine (ASTELIN) 137 MCG/SPRAY nasal spray Place 2 sprays into the nose 2 (two) times daily as needed. Use in each nostril as directed      . [DISCONTINUED] fluticasone (VERAMYST) 27.5 MCG/SPRAY nasal spray Place 1 spray into the nose 2 (two) times daily as needed.        No current facility-administered medications for this visit.    Allergies:    Allergies  Allergen Reactions  . Clopidogrel     Other reaction(s): RASH  . Crestor [Rosuvastatin Calcium]     Caused Myalgias  . Effexor [Venlafaxine Hydrochloride] Other (See Comments)    Caused PB to go up  . Lipitor [Atorvastatin Calcium]     Causes Rash  . Nardil     Caused BP Problems  . Penicillins     Swelling  . Plavix [Clopidogrel Bisulfate]     Causes Rash  . Rosuvastatin     Muscle aches  . Simvastatin     Causes Rash  . Sulfonamide Derivatives     Rash  . Venlafaxine     BP problems  . Zestril [Lisinopril]     Causes Rash  . Adhesive [Tape] Rash  . Atorvastatin Rash    Social History:  The patient  reports that she has quit smoking. She has never used smokeless tobacco.   Family History  Problem Relation Age of Onset  . Stomach cancer Father   . Atrial fibrillation Brother     ROS:  Please see the history of present illness.   Denies any syncope, bleeding, orthopnea, PND. Positive for vertigo.  All other systems reviewed and negative.   PHYSICAL EXAM: VS:  BP 122/84  Pulse 81  Ht 5\' 2"  (1.575 m)  Wt 153 lb (69.4 kg)  BMI 27.98 kg/m2 Well nourished, well developed, in no acute distress HEENT: normal, [place]/AT, EOMI Neck: no JVD, normal carotid upstroke,  no bruit Cardiac:  normal S1, S2; RRR; no murmur Lungs:  clear to auscultation bilaterally, no  wheezing, rhonchi or rales Abd: soft, nontender, no hepatomegaly, no bruits Ext: no edema, 2+ distal pulses Skin: warm and dry GU: deferred Neuro: no focal abnormalities noted, AAO x 3  EKG:  No EKG today. Pacemaker interrogated previously.  ASSESSMENT AND PLAN:  1. Paroxysmal atrial fibrillation- CHADS-VASc score is 3. I encouraged her to take anticoagulation. Her daughter is a Marine scientist. She will think about it.  2. Pacemaker-functioning well. Dr. Caryl Comes.  3. Palpitations-one morning, woke up with pounding in her chest, brief period pacemaker interrogation will be helpful coming up soon.  4. Hypertension-well-controlled medications reviewed.  5. Vertigo-per neurology. 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  6. One year follow up.   Signed, Candee Furbish, MD [facility]  06/15/2014 2:20 PM

## 2014-06-15 NOTE — Patient Instructions (Signed)
The current medical regimen is effective;  continue present plan and medications.  Follow up in 1 year with Dr Skains.  You will receive a letter in the mail 2 months before you are due.  Please call us when you receive this letter to schedule your follow up appointment.  

## 2014-06-20 DIAGNOSIS — H409 Unspecified glaucoma: Secondary | ICD-10-CM | POA: Diagnosis not present

## 2014-06-20 DIAGNOSIS — H2510 Age-related nuclear cataract, unspecified eye: Secondary | ICD-10-CM | POA: Diagnosis not present

## 2014-06-20 DIAGNOSIS — H401490 Capsular glaucoma with pseudoexfoliation of lens, unspecified eye, stage unspecified: Secondary | ICD-10-CM | POA: Diagnosis not present

## 2014-06-22 ENCOUNTER — Ambulatory Visit (INDEPENDENT_AMBULATORY_CARE_PROVIDER_SITE_OTHER): Payer: Medicare Other | Admitting: *Deleted

## 2014-06-22 DIAGNOSIS — Z950 Presence of cardiac pacemaker: Secondary | ICD-10-CM | POA: Diagnosis not present

## 2014-06-22 DIAGNOSIS — I10 Essential (primary) hypertension: Secondary | ICD-10-CM | POA: Diagnosis not present

## 2014-06-22 DIAGNOSIS — Z23 Encounter for immunization: Secondary | ICD-10-CM | POA: Diagnosis not present

## 2014-06-22 DIAGNOSIS — E039 Hypothyroidism, unspecified: Secondary | ICD-10-CM | POA: Diagnosis not present

## 2014-06-22 DIAGNOSIS — E119 Type 2 diabetes mellitus without complications: Secondary | ICD-10-CM | POA: Diagnosis not present

## 2014-06-22 DIAGNOSIS — K219 Gastro-esophageal reflux disease without esophagitis: Secondary | ICD-10-CM | POA: Diagnosis not present

## 2014-06-22 DIAGNOSIS — I495 Sick sinus syndrome: Secondary | ICD-10-CM

## 2014-06-22 DIAGNOSIS — N183 Chronic kidney disease, stage 3 unspecified: Secondary | ICD-10-CM | POA: Diagnosis not present

## 2014-06-22 DIAGNOSIS — G609 Hereditary and idiopathic neuropathy, unspecified: Secondary | ICD-10-CM | POA: Diagnosis not present

## 2014-06-22 DIAGNOSIS — E782 Mixed hyperlipidemia: Secondary | ICD-10-CM | POA: Diagnosis not present

## 2014-06-22 DIAGNOSIS — F411 Generalized anxiety disorder: Secondary | ICD-10-CM | POA: Diagnosis not present

## 2014-06-22 LAB — MDC_IDC_ENUM_SESS_TYPE_INCLINIC
Battery Remaining Longevity: 142 mo
Battery Voltage: 2.79 V
Brady Statistic AS VP Percent: 0 %
Brady Statistic AS VS Percent: 86 %
Date Time Interrogation Session: 20150916145717
Lead Channel Impedance Value: 647 Ohm
Lead Channel Pacing Threshold Amplitude: 0.75 V
Lead Channel Pacing Threshold Pulse Width: 0.4 ms
Lead Channel Setting Pacing Amplitude: 2.5 V
Lead Channel Setting Pacing Pulse Width: 0.4 ms
Lead Channel Setting Sensing Sensitivity: 5.6 mV
MDC IDC MSMT BATTERY IMPEDANCE: 157 Ohm
MDC IDC MSMT LEADCHNL RA IMPEDANCE VALUE: 523 Ohm
MDC IDC MSMT LEADCHNL RA PACING THRESHOLD AMPLITUDE: 0.75 V
MDC IDC MSMT LEADCHNL RA PACING THRESHOLD PULSEWIDTH: 0.4 ms
MDC IDC MSMT LEADCHNL RA SENSING INTR AMPL: 4 mV
MDC IDC MSMT LEADCHNL RV SENSING INTR AMPL: 15.67 mV
MDC IDC SET LEADCHNL RA PACING AMPLITUDE: 2 V
MDC IDC STAT BRADY AP VP PERCENT: 4 %
MDC IDC STAT BRADY AP VS PERCENT: 10 %

## 2014-06-22 NOTE — Progress Notes (Signed)
Pacemaker check in clinic. Normal device function. Thresholds, sensing, impedances consistent with previous measurements. Device programmed to maximize longevity. 1 mode switch--- <0.1%. No high ventricular rates noted. Device programmed at appropriate safety margins. Histogram distribution appropriate for patient activity level. Device programmed to optimize intrinsic conduction. Estimated longevity 9yrs. ROV w/ Dr. Caryl Comes in 36mo.

## 2014-07-11 ENCOUNTER — Encounter: Payer: Self-pay | Admitting: Internal Medicine

## 2014-07-13 DIAGNOSIS — G450 Vertebro-basilar artery syndrome: Secondary | ICD-10-CM | POA: Diagnosis not present

## 2014-07-13 DIAGNOSIS — R55 Syncope and collapse: Secondary | ICD-10-CM | POA: Diagnosis not present

## 2014-07-13 DIAGNOSIS — G603 Idiopathic progressive neuropathy: Secondary | ICD-10-CM | POA: Diagnosis not present

## 2014-07-18 DIAGNOSIS — H2513 Age-related nuclear cataract, bilateral: Secondary | ICD-10-CM | POA: Diagnosis not present

## 2014-07-18 DIAGNOSIS — H401412 Capsular glaucoma with pseudoexfoliation of lens, right eye, moderate stage: Secondary | ICD-10-CM | POA: Diagnosis not present

## 2014-07-18 DIAGNOSIS — H401422 Capsular glaucoma with pseudoexfoliation of lens, left eye, moderate stage: Secondary | ICD-10-CM | POA: Diagnosis not present

## 2014-09-15 ENCOUNTER — Encounter (HOSPITAL_COMMUNITY): Payer: Self-pay | Admitting: Internal Medicine

## 2014-10-10 ENCOUNTER — Other Ambulatory Visit: Payer: Self-pay | Admitting: Internal Medicine

## 2014-10-10 DIAGNOSIS — N6320 Unspecified lump in the left breast, unspecified quadrant: Secondary | ICD-10-CM

## 2014-10-21 ENCOUNTER — Ambulatory Visit
Admission: RE | Admit: 2014-10-21 | Discharge: 2014-10-21 | Disposition: A | Discharge status: Home / Self Care | Payer: Medicare Other | Source: Ambulatory Visit | Attending: Internal Medicine | Admitting: Internal Medicine

## 2014-10-21 DIAGNOSIS — D242 Benign neoplasm of left breast: Secondary | ICD-10-CM | POA: Diagnosis not present

## 2014-10-21 DIAGNOSIS — N6320 Unspecified lump in the left breast, unspecified quadrant: Secondary | ICD-10-CM

## 2014-11-02 DIAGNOSIS — H401412 Capsular glaucoma with pseudoexfoliation of lens, right eye, moderate stage: Secondary | ICD-10-CM | POA: Diagnosis not present

## 2014-11-02 DIAGNOSIS — H401422 Capsular glaucoma with pseudoexfoliation of lens, left eye, moderate stage: Secondary | ICD-10-CM | POA: Diagnosis not present

## 2014-11-02 DIAGNOSIS — H2513 Age-related nuclear cataract, bilateral: Secondary | ICD-10-CM | POA: Diagnosis not present

## 2014-12-02 ENCOUNTER — Encounter: Payer: Self-pay | Admitting: *Deleted

## 2014-12-21 ENCOUNTER — Ambulatory Visit (INDEPENDENT_AMBULATORY_CARE_PROVIDER_SITE_OTHER): Payer: Medicare Other | Admitting: *Deleted

## 2014-12-21 DIAGNOSIS — I495 Sick sinus syndrome: Secondary | ICD-10-CM | POA: Diagnosis not present

## 2014-12-21 DIAGNOSIS — Z950 Presence of cardiac pacemaker: Secondary | ICD-10-CM

## 2014-12-21 LAB — MDC_IDC_ENUM_SESS_TYPE_INCLINIC
Battery Impedance: 181 Ohm
Brady Statistic AP VP Percent: 1 %
Brady Statistic AP VS Percent: 5 %
Brady Statistic AS VS Percent: 94 %
Lead Channel Impedance Value: 529 Ohm
Lead Channel Impedance Value: 647 Ohm
Lead Channel Pacing Threshold Amplitude: 0.75 V
Lead Channel Pacing Threshold Pulse Width: 0.4 ms
Lead Channel Pacing Threshold Pulse Width: 0.4 ms
Lead Channel Sensing Intrinsic Amplitude: 4 mV
Lead Channel Setting Pacing Amplitude: 2 V
Lead Channel Setting Pacing Pulse Width: 0.4 ms
Lead Channel Setting Sensing Sensitivity: 4 mV
MDC IDC MSMT BATTERY REMAINING LONGEVITY: 139 mo
MDC IDC MSMT BATTERY VOLTAGE: 2.8 V
MDC IDC MSMT LEADCHNL RV PACING THRESHOLD AMPLITUDE: 0.75 V
MDC IDC MSMT LEADCHNL RV SENSING INTR AMPL: 15.67 mV
MDC IDC SESS DTM: 20160316170611
MDC IDC SET LEADCHNL RV PACING AMPLITUDE: 2.5 V
MDC IDC STAT BRADY AS VP PERCENT: 0 %

## 2014-12-21 NOTE — Progress Notes (Signed)
Pacemaker check in clinic. Normal device function. Thresholds, sensing, impedances consistent with previous measurements. Device programmed to maximize longevity. No mode switch or high ventricular rates noted. Device programmed at appropriate safety margins. Histogram distribution appropriate for patient activity level. Device programmed to optimize intrinsic conduction. Estimated longevity 11.18yrs. [facility] 03/22/15 & ROV w/ Dr. Caryl Comes in 67mo.

## 2015-01-05 ENCOUNTER — Encounter: Payer: Self-pay | Admitting: Internal Medicine

## 2015-01-23 DIAGNOSIS — F3342 Major depressive disorder, recurrent, in full remission: Secondary | ICD-10-CM | POA: Diagnosis not present

## 2015-02-10 DIAGNOSIS — E1122 Type 2 diabetes mellitus with diabetic chronic kidney disease: Secondary | ICD-10-CM | POA: Diagnosis not present

## 2015-02-10 DIAGNOSIS — F322 Major depressive disorder, single episode, severe without psychotic features: Secondary | ICD-10-CM | POA: Diagnosis not present

## 2015-02-10 DIAGNOSIS — Z1389 Encounter for screening for other disorder: Secondary | ICD-10-CM | POA: Diagnosis not present

## 2015-02-10 DIAGNOSIS — Z0000 Encounter for general adult medical examination without abnormal findings: Secondary | ICD-10-CM | POA: Diagnosis not present

## 2015-02-10 DIAGNOSIS — G450 Vertebro-basilar artery syndrome: Secondary | ICD-10-CM | POA: Diagnosis not present

## 2015-02-10 DIAGNOSIS — G629 Polyneuropathy, unspecified: Secondary | ICD-10-CM | POA: Diagnosis not present

## 2015-02-10 DIAGNOSIS — I10 Essential (primary) hypertension: Secondary | ICD-10-CM | POA: Diagnosis not present

## 2015-02-10 DIAGNOSIS — E039 Hypothyroidism, unspecified: Secondary | ICD-10-CM | POA: Diagnosis not present

## 2015-02-10 DIAGNOSIS — N183 Chronic kidney disease, stage 3 (moderate): Secondary | ICD-10-CM | POA: Diagnosis not present

## 2015-02-10 DIAGNOSIS — I480 Paroxysmal atrial fibrillation: Secondary | ICD-10-CM | POA: Diagnosis not present

## 2015-02-10 DIAGNOSIS — E782 Mixed hyperlipidemia: Secondary | ICD-10-CM | POA: Diagnosis not present

## 2015-02-13 DIAGNOSIS — H401422 Capsular glaucoma with pseudoexfoliation of lens, left eye, moderate stage: Secondary | ICD-10-CM | POA: Diagnosis not present

## 2015-02-13 DIAGNOSIS — H2513 Age-related nuclear cataract, bilateral: Secondary | ICD-10-CM | POA: Diagnosis not present

## 2015-02-13 DIAGNOSIS — H401412 Capsular glaucoma with pseudoexfoliation of lens, right eye, moderate stage: Secondary | ICD-10-CM | POA: Diagnosis not present

## 2015-02-27 DIAGNOSIS — H401412 Capsular glaucoma with pseudoexfoliation of lens, right eye, moderate stage: Secondary | ICD-10-CM | POA: Diagnosis not present

## 2015-02-27 DIAGNOSIS — H401422 Capsular glaucoma with pseudoexfoliation of lens, left eye, moderate stage: Secondary | ICD-10-CM | POA: Diagnosis not present

## 2015-02-27 DIAGNOSIS — H2513 Age-related nuclear cataract, bilateral: Secondary | ICD-10-CM | POA: Diagnosis not present

## 2015-03-13 ENCOUNTER — Other Ambulatory Visit: Payer: Self-pay | Admitting: Internal Medicine

## 2015-03-13 DIAGNOSIS — N649 Disorder of breast, unspecified: Secondary | ICD-10-CM

## 2015-03-13 DIAGNOSIS — Z803 Family history of malignant neoplasm of breast: Secondary | ICD-10-CM

## 2015-03-21 ENCOUNTER — Telehealth: Payer: Self-pay | Admitting: Internal Medicine

## 2015-03-21 NOTE — Telephone Encounter (Signed)
°  1. Has your device fired? 2. Is you device beeping?  3. Are you experiencing draining or swelling at device site?  4. Are you calling to see if we received your device transmission?  5. Have you passed out?   Comments: Pt called states that she doesn't have the digital device to send the signal. Please call

## 2015-03-22 ENCOUNTER — Encounter: Payer: Self-pay | Admitting: Internal Medicine

## 2015-03-22 ENCOUNTER — Ambulatory Visit (INDEPENDENT_AMBULATORY_CARE_PROVIDER_SITE_OTHER): Payer: Medicare Other | Admitting: *Deleted

## 2015-03-22 DIAGNOSIS — I495 Sick sinus syndrome: Secondary | ICD-10-CM

## 2015-03-22 NOTE — Telephone Encounter (Signed)
Follow  Up   Pt calling to follow up on call from earlier. Please call.

## 2015-03-22 NOTE — Telephone Encounter (Signed)
Spoke w/ pt and assisted her with setting up her home monitor. After several attempts and an unsuccessful attempt informed pt to call tech services b/c home monitor keep turning off. Pt verbalized understanding.

## 2015-03-22 NOTE — Telephone Encounter (Signed)
Spoke w/ pt and she is going to call back when she gets home to receive help setting up home monitor.

## 2015-03-23 NOTE — Progress Notes (Signed)
Remote pacemaker transmission.   

## 2015-03-27 ENCOUNTER — Telehealth: Payer: Self-pay | Admitting: Internal Medicine

## 2015-03-27 DIAGNOSIS — Z888 Allergy status to other drugs, medicaments and biological substances status: Secondary | ICD-10-CM | POA: Diagnosis not present

## 2015-03-27 DIAGNOSIS — H401422 Capsular glaucoma with pseudoexfoliation of lens, left eye, moderate stage: Secondary | ICD-10-CM | POA: Diagnosis not present

## 2015-03-27 DIAGNOSIS — H2512 Age-related nuclear cataract, left eye: Secondary | ICD-10-CM | POA: Diagnosis not present

## 2015-03-27 DIAGNOSIS — Z882 Allergy status to sulfonamides status: Secondary | ICD-10-CM | POA: Diagnosis not present

## 2015-03-27 DIAGNOSIS — Z880 Allergy status to penicillin: Secondary | ICD-10-CM | POA: Diagnosis not present

## 2015-03-27 LAB — CUP PACEART REMOTE DEVICE CHECK
Battery Remaining Longevity: 150 mo
Brady Statistic AP VP Percent: 0 %
Brady Statistic AP VS Percent: 4 %
Date Time Interrogation Session: 20160615184424
Lead Channel Impedance Value: 619 Ohm
Lead Channel Pacing Threshold Amplitude: 0.625 V
Lead Channel Pacing Threshold Pulse Width: 0.4 ms
Lead Channel Pacing Threshold Pulse Width: 0.4 ms
Lead Channel Sensing Intrinsic Amplitude: 11.2 mV
Lead Channel Sensing Intrinsic Amplitude: 2.8 mV
Lead Channel Setting Pacing Amplitude: 2 V
MDC IDC MSMT BATTERY IMPEDANCE: 181 Ohm
MDC IDC MSMT BATTERY VOLTAGE: 2.8 V
MDC IDC MSMT LEADCHNL RA IMPEDANCE VALUE: 537 Ohm
MDC IDC MSMT LEADCHNL RV PACING THRESHOLD AMPLITUDE: 0.75 V
MDC IDC SET LEADCHNL RV PACING AMPLITUDE: 2.5 V
MDC IDC SET LEADCHNL RV PACING PULSEWIDTH: 0.4 ms
MDC IDC SET LEADCHNL RV SENSING SENSITIVITY: 5.6 mV
MDC IDC STAT BRADY AS VP PERCENT: 0 %
MDC IDC STAT BRADY AS VS PERCENT: 96 %

## 2015-03-27 NOTE — Telephone Encounter (Signed)
Spoke with Jeannie Done, NP from Dr. Joellen Jersey office. Jeannie Done, NP states that pt is having a Trabeculectomy on 04/11/15. Jeannie Done would instructions on how long pt can hold her ASA 81mg  prior to surgery? Informed Jeannie Done that I would route this information to Dr. Caryl Comes and his nurse for review, advisement and follow up. Jeannie Done provided fax number that information can be sent to [phone].

## 2015-03-27 NOTE — Telephone Encounter (Signed)
Marcie from [facility] called back. Informed her that Dr. Marlou Porch said ok for pt to hold ASA for 7 days. Marcie verbalized understanding and said that she would contact the pt with this information.

## 2015-03-27 NOTE — Telephone Encounter (Signed)
New message    Request for surgical clearance:  1. What type of surgery is being performed?  Eye surgery   2. When is this surgery scheduled? Today   3. Are there any medications that need to be held prior to surgery and how long? Heidi [name] , can she come off be safe surgery or when does she needs return.   4. Name of physician performing surgery?  [place]   5. What is your office phone and fax number?

## 2015-03-27 NOTE — Telephone Encounter (Signed)
Message was forwarded to Dr. Marlou Porch as he is pt's primary cardiologist. Left message for pt to call back. Dr. Marlou Porch states ok to hold ASA for 7 days.  Faxing information to Dr. Joellen Jersey office.

## 2015-03-27 NOTE — Telephone Encounter (Signed)
Hold ASA for 7 days.  Heidi Furbish, MD

## 2015-04-14 ENCOUNTER — Encounter: Payer: Self-pay | Admitting: Cardiology

## 2015-04-18 DIAGNOSIS — Z880 Allergy status to penicillin: Secondary | ICD-10-CM | POA: Diagnosis not present

## 2015-04-18 DIAGNOSIS — K219 Gastro-esophageal reflux disease without esophagitis: Secondary | ICD-10-CM | POA: Diagnosis not present

## 2015-04-18 DIAGNOSIS — Z888 Allergy status to other drugs, medicaments and biological substances status: Secondary | ICD-10-CM | POA: Diagnosis not present

## 2015-04-18 DIAGNOSIS — Z7982 Long term (current) use of aspirin: Secondary | ICD-10-CM | POA: Diagnosis not present

## 2015-04-18 DIAGNOSIS — I495 Sick sinus syndrome: Secondary | ICD-10-CM | POA: Diagnosis not present

## 2015-04-18 DIAGNOSIS — H401420 Capsular glaucoma with pseudoexfoliation of lens, left eye, stage unspecified: Secondary | ICD-10-CM | POA: Diagnosis not present

## 2015-04-18 DIAGNOSIS — F40240 Claustrophobia: Secondary | ICD-10-CM | POA: Diagnosis not present

## 2015-04-18 DIAGNOSIS — H2512 Age-related nuclear cataract, left eye: Secondary | ICD-10-CM | POA: Diagnosis not present

## 2015-04-18 DIAGNOSIS — Z91048 Other nonmedicinal substance allergy status: Secondary | ICD-10-CM | POA: Diagnosis not present

## 2015-04-18 DIAGNOSIS — Z950 Presence of cardiac pacemaker: Secondary | ICD-10-CM | POA: Diagnosis not present

## 2015-04-18 DIAGNOSIS — I10 Essential (primary) hypertension: Secondary | ICD-10-CM | POA: Diagnosis not present

## 2015-04-18 DIAGNOSIS — E039 Hypothyroidism, unspecified: Secondary | ICD-10-CM | POA: Diagnosis not present

## 2015-04-18 DIAGNOSIS — Z87891 Personal history of nicotine dependence: Secondary | ICD-10-CM | POA: Diagnosis not present

## 2015-04-18 DIAGNOSIS — E785 Hyperlipidemia, unspecified: Secondary | ICD-10-CM | POA: Diagnosis not present

## 2015-04-18 DIAGNOSIS — H401422 Capsular glaucoma with pseudoexfoliation of lens, left eye, moderate stage: Secondary | ICD-10-CM | POA: Diagnosis not present

## 2015-04-18 DIAGNOSIS — Z83511 Family history of glaucoma: Secondary | ICD-10-CM | POA: Diagnosis not present

## 2015-04-18 DIAGNOSIS — I2510 Atherosclerotic heart disease of native coronary artery without angina pectoris: Secondary | ICD-10-CM | POA: Diagnosis not present

## 2015-04-18 DIAGNOSIS — F329 Major depressive disorder, single episode, unspecified: Secondary | ICD-10-CM | POA: Diagnosis not present

## 2015-04-18 DIAGNOSIS — Z881 Allergy status to other antibiotic agents status: Secondary | ICD-10-CM | POA: Diagnosis not present

## 2015-04-18 DIAGNOSIS — G4733 Obstructive sleep apnea (adult) (pediatric): Secondary | ICD-10-CM | POA: Diagnosis not present

## 2015-04-18 DIAGNOSIS — Z79899 Other long term (current) drug therapy: Secondary | ICD-10-CM | POA: Diagnosis not present

## 2015-04-18 DIAGNOSIS — Z83518 Family history of other specified eye disorder: Secondary | ICD-10-CM | POA: Diagnosis not present

## 2015-04-19 DIAGNOSIS — Z7982 Long term (current) use of aspirin: Secondary | ICD-10-CM | POA: Diagnosis not present

## 2015-04-19 DIAGNOSIS — Z87891 Personal history of nicotine dependence: Secondary | ICD-10-CM | POA: Diagnosis not present

## 2015-04-19 DIAGNOSIS — Z950 Presence of cardiac pacemaker: Secondary | ICD-10-CM | POA: Diagnosis not present

## 2015-04-19 DIAGNOSIS — H401432 Capsular glaucoma with pseudoexfoliation of lens, bilateral, moderate stage: Secondary | ICD-10-CM | POA: Diagnosis not present

## 2015-04-24 ENCOUNTER — Ambulatory Visit
Admission: RE | Admit: 2015-04-24 | Discharge: 2015-04-24 | Disposition: A | Discharge status: Home / Self Care | Payer: Medicare Other | Source: Ambulatory Visit | Attending: Internal Medicine | Admitting: Internal Medicine

## 2015-04-24 DIAGNOSIS — N649 Disorder of breast, unspecified: Secondary | ICD-10-CM

## 2015-04-24 DIAGNOSIS — Z803 Family history of malignant neoplasm of breast: Secondary | ICD-10-CM

## 2015-04-24 DIAGNOSIS — N63 Unspecified lump in breast: Secondary | ICD-10-CM | POA: Diagnosis not present

## 2015-05-30 DIAGNOSIS — Z23 Encounter for immunization: Secondary | ICD-10-CM | POA: Diagnosis not present

## 2015-06-15 ENCOUNTER — Ambulatory Visit (INDEPENDENT_AMBULATORY_CARE_PROVIDER_SITE_OTHER): Payer: Medicare Other | Admitting: Cardiology

## 2015-06-15 ENCOUNTER — Encounter: Payer: Self-pay | Admitting: Cardiology

## 2015-06-15 VITALS — BP 132/70 | HR 80 | Ht 62.0 in | Wt 158.1 lb

## 2015-06-15 DIAGNOSIS — I495 Sick sinus syndrome: Secondary | ICD-10-CM

## 2015-06-15 DIAGNOSIS — Z950 Presence of cardiac pacemaker: Secondary | ICD-10-CM | POA: Diagnosis not present

## 2015-06-15 DIAGNOSIS — I10 Essential (primary) hypertension: Secondary | ICD-10-CM | POA: Diagnosis not present

## 2015-06-15 DIAGNOSIS — I480 Paroxysmal atrial fibrillation: Secondary | ICD-10-CM

## 2015-06-15 NOTE — Patient Instructions (Signed)
Medication Instructions:  The current medical regimen is effective;  continue present plan and medications.  Follow-Up: Follow up in 1 year with Dr. Skains.  You will receive a letter in the mail 2 months before you are due.  Please call us when you receive this letter to schedule your follow up appointment.  Thank you for choosing [facility]!!     

## 2015-06-15 NOTE — Progress Notes (Signed)
[facility]. [address] [place], [place]  [address] Phone: [phone] Fax:  [phone]  Date:  06/15/2015   ID:  Heidi [name], DOB Dec 12, 1940, MRN [id number]  PCP:  Wenda Low, MD   History of Present Illness: Heidi [name] is a 74 y.o. female with hyperlipidemia, paroxysmal atrial fibrillation (no recent episode), hypertension, obstructive sleep apnea status post pacemaker placement in 2007 secondary to tachycardia/bradycardia syndrome with history of depression/ECT treatment.  She underwent generator replacement in spring of 2013. She has a history of paroxysmal atrial fibrillation with a CHADS-VASc score of 3.   Cardiac catheterization was performed on 02/12/2006 which showed minor irregularities, mid RCA 40%, left main normal, LAD normal, ejection fraction 65%. She has infrequent ventricular rate or pacing less than 5%.   Per Dr. Olin Pia note as is stated above she has a history of paroxysmal atrial fibrillation but is not on current anticoagulation therapy.  She feels better than she's felt in 20 years she states. She enjoys going to the beach every year with her red hat women  Main complaint however is lower extremity and pedal edema. Better in the mornings. She does not drink excessive water. She is on spironolactone.  Wt Readings from Last 3 Encounters:  06/15/15 158 lb 1.9 oz (71.723 kg)  06/15/14 153 lb (69.4 kg)  12/01/13 153 lb (69.4 kg)     Past Medical History  Diagnosis Date  . Seasonal allergies   . HTN (hypertension)   . Hyperlipidemia   . Major depression     10-17-2010: hospitalized for suicidal, [name] (D/C 10-30-2010)  . Peripheral neuropathy   . GERD (gastroesophageal reflux disease)   . Sleep apnea   . Glaucoma     Narrow Angle  . Migraine headache   . Hypothyroid   . Atrial fibrillation     Intermittent  . CAD (coronary artery disease)     non obstructive  . Tachy-brady syndrome 04-2006    PPM placed  .  Diabetes mellitus without complication   . CKD (chronic kidney disease)   . OSA (obstructive sleep apnea)     Past Surgical History  Procedure Laterality Date  . Appendectomy    . Abdominal hysterectomy  1976  . Tumor removal  2000    Fibroid from breast x4-most recent  . Cardiac catheterization  02-12-2006    Minor irregularities: RCA-mid 40%, LM-normal, LAD-normal. EF 65%.  . Pacemaker insertion      Permanent. Medtronic EnRhyth 04/2006, set as DDDR  . Abdominal adhesion surgery  2008    exploratory to remove attached to the abdominal wall, stomach and intestines.   . Wrist fracture surgery  11-2007    right, had metal plate inserted  . Ectopic pregnancy surgery      treatment x3, last one 11-02-2009 [facility]  . Cholecystectomy    . Breast surgery      fibroids removed, benign  . Goiter    . Goiter removed    . Pacemaker generator change N/A 01/15/2012    Procedure: PACEMAKER GENERATOR CHANGE;  Surgeon: Deboraha Sprang, MD;  Location: [facility] CATH LAB;  Service: Cardiovascular;  Laterality: N/A;    Current Outpatient Prescriptions  Medication Sig Dispense Refill  . aspirin EC 81 MG tablet Take 1 tablet (81 mg total) by mouth daily.    . cetirizine (ZYRTEC) 10 MG tablet Take 10 mg by mouth daily.      . Cholecalciferol (  VITAMIN D) 2000 UNITS tablet Take 2,000 Units by mouth daily.      . fenofibrate (TRICOR) 145 MG tablet Take 145 mg by mouth at bedtime.     . fluticasone (FLONASE) 50 MCG/ACT nasal spray Place 2 sprays into the nose daily as needed. For allergies    . gabapentin (NEURONTIN) 300 MG capsule Take 300 mg by mouth 3 (three) times daily.      . hydrOXYzine (VISTARIL) 25 MG capsule Take 25 mg by mouth at bedtime. 1 tabs daily    . latanoprost (XALATAN) 0.005 % ophthalmic solution Place 1 drop into both eyes at bedtime.    Marland Kitchen levothyroxine (SYNTHROID, LEVOTHROID) 75 MCG tablet Take 75 mcg by mouth daily.      . meclizine (ANTIVERT) 25 MG tablet Take 25 mg by mouth 3  (three) times daily as needed for dizziness.    . metoprolol tartrate (LOPRESSOR) 25 MG tablet Take a half tablet by mouth twice daily. 30 tablet 1  . naproxen sodium (ANAPROX) 220 MG tablet Take 220 mg by mouth daily.    . nortriptyline (PAMELOR) 75 MG capsule Take 75 mg by mouth at bedtime. 2 tabs once for a total of 150 mg.    . omeprazole (PRILOSEC) 40 MG capsule Take 40 mg by mouth daily.    . polyethylene glycol powder (GLYCOLAX/MIRALAX) powder     . ranitidine (ZANTAC) 300 MG capsule Take 300 mg by mouth 2 (two) times daily.     Marland Kitchen spironolactone (ALDACTONE) 25 MG tablet Take 25 mg by mouth 2 (two) times daily.      . timolol (TIMOPTIC) 0.5 % ophthalmic solution     . [DISCONTINUED] azelastine (ASTELIN) 137 MCG/SPRAY nasal spray Place 2 sprays into the nose 2 (two) times daily as needed. Use in each nostril as directed    . [DISCONTINUED] fluticasone (VERAMYST) 27.5 MCG/SPRAY nasal spray Place 1 spray into the nose 2 (two) times daily as needed.      No current facility-administered medications for this visit.    Allergies:    Allergies  Allergen Reactions  . Clopidogrel     Other reaction(s): RASH  . Crestor [Rosuvastatin Calcium]     Caused Myalgias  . Effexor [Venlafaxine Hydrochloride] Other (See Comments)    Caused PB to go up  . Lipitor [Atorvastatin Calcium]     Causes Rash  . Nardil     Caused BP Problems  . Penicillins     Swelling  . Plavix [Clopidogrel Bisulfate]     Causes Rash  . Rosuvastatin     Muscle aches  . Simvastatin     Causes Rash  . Sulfonamide Derivatives     Rash  . Venlafaxine     BP problems  . Zestril [Lisinopril]     Causes Rash  . Adhesive [Tape] Rash  . Atorvastatin Rash    Social History:  The patient  reports that she has quit smoking. She has never used smokeless tobacco.   Family History  Problem Relation Age of Onset  . Stomach cancer Father   . Atrial fibrillation Brother     ROS:  Please see the history of present  illness.   Denies any syncope, bleeding, orthopnea, PND. Positive for vertigo.  All other systems reviewed and negative.   PHYSICAL EXAM: VS:  BP 132/70 mmHg  Pulse 80  Ht 5\' 2"  (1.575 m)  Wt 158 lb 1.9 oz (71.723 kg)  BMI 28.91 kg/m2 Well nourished, well developed, in  no acute distress HEENT: normal, [place]/AT, EOMI Neck: no JVD, normal carotid upstroke, no bruit Cardiac:  normal S1, S2; RRR; no murmur Lungs:  clear to auscultation bilaterally, no wheezing, rhonchi or rales Abd: soft, nontender, no hepatomegaly, no bruits Ext: Pedal edema 2+ edema, 2+ distal pulses Skin: warm and dry GU: deferred Neuro: no focal abnormalities noted, AAO x 3  EKG: Today 06/15/15-sinus rhythm, left axis deviation, 80, personally viewed. Pacemaker interrogated previously.  ASSESSMENT AND PLAN:  1. Paroxysmal atrial fibrillation- CHADS-VASc score is 3. I encouraged her to take anticoagulation. Her daughter is a Marine scientist. She will think about it.  She understands risks of stroke. Continue to monitor for any events on pacemaker. 2. Pacemaker-functioning well. Dr. Caryl Comes.  3. Edema of, lower cavities-continue with spironolactone. Avoid excessive fluid intake. Exercise, walking, weight loss can help with this. I offered her a short-term course of Lasix but she did not wish to use this medication. This is fine. Last creatinine 1.2 in our system.  4. Hypertension-well-controlled medications reviewed.  5. Vertigo-per neurology. 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  6. One year follow up.   Signed, Candee Furbish, MD [facility]  06/15/2015 8:31 AM

## 2015-07-07 ENCOUNTER — Ambulatory Visit (INDEPENDENT_AMBULATORY_CARE_PROVIDER_SITE_OTHER): Payer: Medicare Other | Admitting: Internal Medicine

## 2015-07-07 ENCOUNTER — Encounter: Payer: Self-pay | Admitting: Internal Medicine

## 2015-07-07 VITALS — BP 126/82 | HR 72 | Ht 62.0 in | Wt 160.2 lb

## 2015-07-07 DIAGNOSIS — I495 Sick sinus syndrome: Secondary | ICD-10-CM | POA: Diagnosis not present

## 2015-07-07 DIAGNOSIS — Z45018 Encounter for adjustment and management of other part of cardiac pacemaker: Secondary | ICD-10-CM

## 2015-07-07 DIAGNOSIS — I480 Paroxysmal atrial fibrillation: Secondary | ICD-10-CM

## 2015-07-07 DIAGNOSIS — Z79899 Other long term (current) drug therapy: Secondary | ICD-10-CM | POA: Diagnosis not present

## 2015-07-07 DIAGNOSIS — Z950 Presence of cardiac pacemaker: Secondary | ICD-10-CM | POA: Diagnosis not present

## 2015-07-07 LAB — COMPREHENSIVE METABOLIC PANEL
ALT: 32 U/L (ref 0–35)
AST: 25 U/L (ref 0–37)
Albumin: 4.3 g/dL (ref 3.5–5.2)
Alkaline Phosphatase: 65 U/L (ref 39–117)
BILIRUBIN TOTAL: 0.4 mg/dL (ref 0.2–1.2)
BUN: 20 mg/dL (ref 6–23)
CALCIUM: 10.2 mg/dL (ref 8.4–10.5)
CO2: 30 meq/L (ref 19–32)
CREATININE: 1.18 mg/dL (ref 0.40–1.20)
Chloride: 104 mEq/L (ref 96–112)
GFR: 47.58 mL/min — AB (ref >60.00)
GLUCOSE: 123 mg/dL — AB (ref 70–99)
Potassium: 4.5 mEq/L (ref 3.5–5.1)
Sodium: 141 mEq/L (ref 135–145)
TOTAL PROTEIN: 7.3 g/dL (ref 6.0–8.3)

## 2015-07-07 MED ORDER — FUROSEMIDE 20 MG PO TABS: 20.0000 mg | ORAL_TABLET | Freq: Every day | ORAL | Status: DC | PRN

## 2015-07-07 NOTE — Patient Instructions (Addendum)
Medication Instructions:  Your physician has recommended you make the following change in your medication:  1) START Furosemide 20 mg daily as needed for swelling.    Take it daily for the next 5 days, then take it daily as needed 2) STOP Metoprolol  Labwork: Medication surveillance lab today: CMET  Testing/Procedures: None ordered  Follow-Up: Remote monitoring is used to monitor your Pacemaker of ICD from home. This monitoring reduces the number of office visits required to check your device to one time per year. It allows [place] to keep an eye on the functioning of your device to ensure it is working properly. You are scheduled for a device check from home on 10/10/15. You may send your transmission at any time that day. If you have a wireless device, the transmission will be sent automatically. After your physician reviews your transmission, you will receive a postcard with your next transmission date.  Your physician wants you to follow-up in: 1 year with Dr. Caryl Comes.  You will receive a reminder letter in the mail two months in advance. If you don't receive a letter, please call our office to schedule the follow-up appointment.   Any Other Special Instructions Will Be Listed Below (If Applicable). Thank you for choosing [facility]!!

## 2015-07-07 NOTE — Progress Notes (Signed)
Patient Care Team: Wenda Low, MD as PCP - General (Internal Medicine)   HPI  Heidi [name] is a 74 y.o. 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  She has paroxysmal atrial fibrillation with a CHADS-VASc score of 3  She does not take anticoag    She is doing exceptionally well. She had a great time at the beach with her "red hat ladies". 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  She has complaints of progressive peripheral edema. Her medications include spironolactone twice daily. There has been no recent blood work.    Cardiac catheterization was performed on 02/12/2006 which showed minor irregularities, mid RCA 40%, left main normal, LAD normal, ejection fraction 65%. She has infrequent ventricular rate or pacing less than 5%.   Past Medical History  Diagnosis Date  . Seasonal allergies   . HTN (hypertension)   . Hyperlipidemia   . Major depression     10-17-2010: hospitalized for suicidal, [name] (D/C 10-30-2010)  . Peripheral neuropathy   . GERD (gastroesophageal reflux disease)   . Sleep apnea   . Glaucoma     [place]  . Migraine headache   . Hypothyroid   . Atrial fibrillation     Intermittent  . CAD (coronary artery disease)     non obstructive  . Tachy-brady syndrome 04-2006    PPM placed  . Diabetes mellitus without complication   . CKD (chronic kidney disease)   . OSA (obstructive sleep apnea)     Past Surgical History  Procedure Laterality Date  . Appendectomy    . Abdominal hysterectomy  1976  . Tumor removal  2000    Fibroid from breast x4-most recent  . Cardiac catheterization  02-12-2006    Minor irregularities: RCA-mid 40%, LM-normal, LAD-normal. EF 65%.  . Pacemaker insertion      Permanent. Medtronic EnRhyth 04/2006, set as DDDR  . Abdominal adhesion  surgery  2008    exploratory to remove attached to the abdominal wall, stomach and intestines.   . Wrist fracture surgery  11-2007    right, had metal plate inserted  . Ectopic pregnancy surgery      treatment x3, last one 11-02-2009 at [facility]  . Cholecystectomy    . Breast surgery      fibroids removed, benign  . Goiter    . Goiter removed    . Pacemaker generator change N/A 01/15/2012    Procedure: PACEMAKER GENERATOR CHANGE;  Surgeon: Deboraha Sprang, MD;  Location: [facility] CATH LAB;  Service: Cardiovascular;  Laterality: N/A;    Current Outpatient Prescriptions  Medication Sig Dispense Refill  . aspirin EC 81 MG tablet Take 1 tablet (81 mg total) by mouth daily.    . cetirizine (ZYRTEC) 10 MG tablet Take 10 mg by mouth daily.      . Cholecalciferol (VITAMIN D) 2000 UNITS tablet Take 2,000 Units by mouth daily.      Marland Kitchen donepezil (ARICEPT) 10 MG tablet     . fenofibrate (TRICOR) 145 MG tablet Take 145 mg by mouth at bedtime.     . fluticasone (FLONASE) 50 MCG/ACT nasal spray Place 2 sprays into the nose daily as needed. For allergies    . gabapentin (NEURONTIN) 300 MG capsule Take 300 mg by mouth 3 (three) times daily.      Marland Kitchen  hydrOXYzine (VISTARIL) 25 MG capsule Take 25 mg by mouth at bedtime. 1 tabs daily    . latanoprost (XALATAN) 0.005 % ophthalmic solution Place 1 drop into both eyes at bedtime.    Marland Kitchen levothyroxine (SYNTHROID, LEVOTHROID) 75 MCG tablet Take 75 mcg by mouth daily.      . meclizine (ANTIVERT) 25 MG tablet Take 25 mg by mouth 3 (three) times daily as needed for dizziness.    . metoprolol tartrate (LOPRESSOR) 25 MG tablet Take a half tablet by mouth twice daily. 30 tablet 1  . naproxen sodium (ANAPROX) 220 MG tablet Take 220 mg by mouth daily.    . nortriptyline (PAMELOR) 75 MG capsule Take 75 mg by mouth at bedtime. 2 tabs once for a total of 150 mg.    . omeprazole (PRILOSEC) 40 MG capsule Take 40 mg by mouth daily.    . polyethylene glycol (MIRALAX / GLYCOLAX)  packet Take 17 g by mouth as needed.    . prednisoLONE acetate (PRED FORTE) 1 % ophthalmic suspension     . ranitidine (ZANTAC) 300 MG capsule Take 300 mg by mouth 2 (two) times daily.     Marland Kitchen spironolactone (ALDACTONE) 25 MG tablet Take 25 mg by mouth 2 (two) times daily.      . timolol (BETIMOL) 0.5 % ophthalmic solution Place 1 drop into the right eye 2 (two) times daily.    . [DISCONTINUED] azelastine (ASTELIN) 137 MCG/SPRAY nasal spray Place 2 sprays into the nose 2 (two) times daily as needed. Use in each nostril as directed    . [DISCONTINUED] fluticasone (VERAMYST) 27.5 MCG/SPRAY nasal spray Place 1 spray into the nose 2 (two) times daily as needed.      No current facility-administered medications for this visit.    Allergies  Allergen Reactions  . Clopidogrel     Other reaction(s): RASH  . Crestor [Rosuvastatin Calcium]     Caused Myalgias  . Effexor [Venlafaxine Hydrochloride] Other (See Comments)    Caused PB to go up  . Lipitor [Atorvastatin Calcium]     Causes Rash  . Nardil     Caused BP Problems  . Penicillins     Swelling  . Plavix [Clopidogrel Bisulfate]     Causes Rash  . Rosuvastatin     Muscle aches  . Simvastatin     Causes Rash  . Sulfonamide Derivatives     Rash  . Venlafaxine     BP problems  . Zestril [Lisinopril]     Causes Rash  . Adhesive [Tape] Rash  . Atorvastatin Rash    Review of Systems negative except from HPI and PMH  Physical Exam BP 126/82 mmHg  Pulse 72  Ht 5\' 2"  (1.575 m)  Wt 160 lb 3.2 oz (72.666 kg)  BMI 29.29 kg/m2 Well developed and well nourished in no acute distress HENT normal E scleral and icterus clear Neck Supple JVP flat; carotids brisk and full Clear to ausculation Device pocket well healed; without hematoma or erythema.  There is no tethering  regular rate and rhythm, no murmurs gallops or rub Soft with active bowel sounds No clubbing cyanosis 2+ Edema Alert and oriented, grossly normal motor and sensory  function Skin Warm and Dry  ECG dated 916 demonstrated demonstrates sinus rhythm 80 Intervals 19/11/38   axis left -41 Assessment and  Plan    Peri Orbital and peripheral edema  Sinus bradycardia/tachycardia bradycardia syndrome  Atrial fibrillation-paroxysmal  Hypertension  Blood pressure is well-controlled. We will  stop her Lopressor.  She has significant peripheral edema. We will begin her on furosemide 20 mg daily 5 days and see if we can't make a dent in this. She will then use it when necessary. She will follow [place] up with by telephone to let us know how the edema has resolved.  With the periorbital component, we will need to check her renal function will check a protein also to make sure there is no evidence of nephrotic syndrome.

## 2015-07-10 DIAGNOSIS — F3342 Major depressive disorder, recurrent, in full remission: Secondary | ICD-10-CM | POA: Diagnosis not present

## 2015-07-13 DIAGNOSIS — R55 Syncope and collapse: Secondary | ICD-10-CM | POA: Diagnosis not present

## 2015-07-13 DIAGNOSIS — G603 Idiopathic progressive neuropathy: Secondary | ICD-10-CM | POA: Diagnosis not present

## 2015-07-13 DIAGNOSIS — G450 Vertebro-basilar artery syndrome: Secondary | ICD-10-CM | POA: Diagnosis not present

## 2015-07-13 LAB — CUP PACEART INCLINIC DEVICE CHECK
Battery Impedance: 181 Ohm
Battery Voltage: 2.79 V
Brady Statistic AS VP Percent: 0.1 %
Lead Channel Sensing Intrinsic Amplitude: 11.2 mV
Lead Channel Setting Pacing Amplitude: 2.5 V
Lead Channel Setting Sensing Sensitivity: 5.6 mV
MDC IDC MSMT LEADCHNL RA PACING THRESHOLD AMPLITUDE: 0.75 V
MDC IDC MSMT LEADCHNL RA PACING THRESHOLD PULSEWIDTH: 0.4 ms
MDC IDC MSMT LEADCHNL RA SENSING INTR AMPL: 4 mV
MDC IDC MSMT LEADCHNL RV PACING THRESHOLD AMPLITUDE: 0.75 V
MDC IDC MSMT LEADCHNL RV PACING THRESHOLD PULSEWIDTH: 0.4 ms
MDC IDC SESS DTM: 20161006133820
MDC IDC SET LEADCHNL RA PACING AMPLITUDE: 2 V
MDC IDC SET LEADCHNL RV PACING PULSEWIDTH: 0.4 ms
MDC IDC STAT BRADY AP VP PERCENT: 0.3 %
MDC IDC STAT BRADY AP VS PERCENT: 4.2 %
MDC IDC STAT BRADY AS VS PERCENT: 95.5 %

## 2015-07-25 ENCOUNTER — Encounter: Payer: Self-pay | Admitting: Internal Medicine

## 2015-07-26 DIAGNOSIS — J329 Chronic sinusitis, unspecified: Secondary | ICD-10-CM | POA: Diagnosis not present

## 2015-08-09 DIAGNOSIS — G450 Vertebro-basilar artery syndrome: Secondary | ICD-10-CM | POA: Diagnosis not present

## 2015-08-09 DIAGNOSIS — R55 Syncope and collapse: Secondary | ICD-10-CM | POA: Diagnosis not present

## 2015-08-16 DIAGNOSIS — Z1389 Encounter for screening for other disorder: Secondary | ICD-10-CM | POA: Diagnosis not present

## 2015-08-16 DIAGNOSIS — E1122 Type 2 diabetes mellitus with diabetic chronic kidney disease: Secondary | ICD-10-CM | POA: Diagnosis not present

## 2015-08-16 DIAGNOSIS — I10 Essential (primary) hypertension: Secondary | ICD-10-CM | POA: Diagnosis not present

## 2015-08-16 DIAGNOSIS — F322 Major depressive disorder, single episode, severe without psychotic features: Secondary | ICD-10-CM | POA: Diagnosis not present

## 2015-08-16 DIAGNOSIS — N183 Chronic kidney disease, stage 3 (moderate): Secondary | ICD-10-CM | POA: Diagnosis not present

## 2015-08-16 DIAGNOSIS — E782 Mixed hyperlipidemia: Secondary | ICD-10-CM | POA: Diagnosis not present

## 2015-08-16 DIAGNOSIS — G4733 Obstructive sleep apnea (adult) (pediatric): Secondary | ICD-10-CM | POA: Diagnosis not present

## 2015-08-16 DIAGNOSIS — I2510 Atherosclerotic heart disease of native coronary artery without angina pectoris: Secondary | ICD-10-CM | POA: Diagnosis not present

## 2015-08-16 DIAGNOSIS — E039 Hypothyroidism, unspecified: Secondary | ICD-10-CM | POA: Diagnosis not present

## 2015-08-16 DIAGNOSIS — R413 Other amnesia: Secondary | ICD-10-CM | POA: Diagnosis not present

## 2015-08-16 DIAGNOSIS — G629 Polyneuropathy, unspecified: Secondary | ICD-10-CM | POA: Diagnosis not present

## 2015-08-16 DIAGNOSIS — K219 Gastro-esophageal reflux disease without esophagitis: Secondary | ICD-10-CM | POA: Diagnosis not present

## 2015-08-17 DIAGNOSIS — D171 Benign lipomatous neoplasm of skin and subcutaneous tissue of trunk: Secondary | ICD-10-CM | POA: Diagnosis not present

## 2015-08-21 DIAGNOSIS — H401412 Capsular glaucoma with pseudoexfoliation of lens, right eye, moderate stage: Secondary | ICD-10-CM | POA: Diagnosis not present

## 2015-08-21 DIAGNOSIS — H401422 Capsular glaucoma with pseudoexfoliation of lens, left eye, moderate stage: Secondary | ICD-10-CM | POA: Diagnosis not present

## 2015-09-29 DIAGNOSIS — J329 Chronic sinusitis, unspecified: Secondary | ICD-10-CM | POA: Diagnosis not present

## 2015-10-04 DIAGNOSIS — H401422 Capsular glaucoma with pseudoexfoliation of lens, left eye, moderate stage: Secondary | ICD-10-CM | POA: Diagnosis not present

## 2015-10-04 DIAGNOSIS — H401412 Capsular glaucoma with pseudoexfoliation of lens, right eye, moderate stage: Secondary | ICD-10-CM | POA: Diagnosis not present

## 2015-10-09 DIAGNOSIS — J0100 Acute maxillary sinusitis, unspecified: Secondary | ICD-10-CM | POA: Diagnosis not present

## 2015-10-10 ENCOUNTER — Ambulatory Visit (INDEPENDENT_AMBULATORY_CARE_PROVIDER_SITE_OTHER): Payer: Medicare Other | Admitting: *Deleted

## 2015-10-10 ENCOUNTER — Telehealth: Payer: Self-pay | Admitting: Internal Medicine

## 2015-10-10 DIAGNOSIS — I495 Sick sinus syndrome: Secondary | ICD-10-CM

## 2015-10-10 NOTE — Telephone Encounter (Signed)
New message ° ° ° ° ° °Need help with remote transmission °

## 2015-10-10 NOTE — Telephone Encounter (Signed)
Spoke w/ pt and informed that her transmission was received. Pt verbalized understanding.

## 2015-10-11 NOTE — Progress Notes (Signed)
Remote pacemaker transmission.   

## 2015-10-13 ENCOUNTER — Encounter (HOSPITAL_BASED_OUTPATIENT_CLINIC_OR_DEPARTMENT_OTHER): Payer: Self-pay | Admitting: *Deleted

## 2015-10-17 ENCOUNTER — Ambulatory Visit: Payer: Self-pay | Admitting: Surgery

## 2015-10-17 ENCOUNTER — Encounter (HOSPITAL_BASED_OUTPATIENT_CLINIC_OR_DEPARTMENT_OTHER)
Admission: RE | Admit: 2015-10-17 | Discharge: 2015-10-17 | Disposition: A | Discharge status: Home / Self Care | Payer: Medicare Other | Source: Ambulatory Visit | Attending: Surgery | Admitting: Surgery

## 2015-10-17 DIAGNOSIS — Z87891 Personal history of nicotine dependence: Secondary | ICD-10-CM | POA: Diagnosis not present

## 2015-10-17 DIAGNOSIS — E119 Type 2 diabetes mellitus without complications: Secondary | ICD-10-CM | POA: Diagnosis not present

## 2015-10-17 DIAGNOSIS — I4891 Unspecified atrial fibrillation: Secondary | ICD-10-CM | POA: Diagnosis not present

## 2015-10-17 DIAGNOSIS — Z7982 Long term (current) use of aspirin: Secondary | ICD-10-CM | POA: Diagnosis not present

## 2015-10-17 DIAGNOSIS — K219 Gastro-esophageal reflux disease without esophagitis: Secondary | ICD-10-CM | POA: Diagnosis not present

## 2015-10-17 DIAGNOSIS — Z950 Presence of cardiac pacemaker: Secondary | ICD-10-CM | POA: Diagnosis not present

## 2015-10-17 DIAGNOSIS — I2510 Atherosclerotic heart disease of native coronary artery without angina pectoris: Secondary | ICD-10-CM | POA: Diagnosis not present

## 2015-10-17 DIAGNOSIS — E7800 Pure hypercholesterolemia, unspecified: Secondary | ICD-10-CM | POA: Diagnosis not present

## 2015-10-17 DIAGNOSIS — Z79899 Other long term (current) drug therapy: Secondary | ICD-10-CM | POA: Diagnosis not present

## 2015-10-17 DIAGNOSIS — G4730 Sleep apnea, unspecified: Secondary | ICD-10-CM | POA: Diagnosis not present

## 2015-10-17 DIAGNOSIS — F329 Major depressive disorder, single episode, unspecified: Secondary | ICD-10-CM | POA: Diagnosis not present

## 2015-10-17 DIAGNOSIS — D171 Benign lipomatous neoplasm of skin and subcutaneous tissue of trunk: Secondary | ICD-10-CM | POA: Diagnosis not present

## 2015-10-17 DIAGNOSIS — I10 Essential (primary) hypertension: Secondary | ICD-10-CM | POA: Diagnosis not present

## 2015-10-17 DIAGNOSIS — E039 Hypothyroidism, unspecified: Secondary | ICD-10-CM | POA: Diagnosis not present

## 2015-10-17 LAB — CUP PACEART REMOTE DEVICE CHECK
Battery Remaining Longevity: 130 mo
Brady Statistic AP VP Percent: 0 %
Brady Statistic AP VS Percent: 0 %
Brady Statistic AS VP Percent: 0 %
Implantable Lead Implant Date: 20070710
Implantable Lead Implant Date: 20070710
Implantable Lead Location: 753860
Implantable Lead Model: 4470
Implantable Lead Serial Number: 541525
Lead Channel Impedance Value: 664 Ohm
Lead Channel Pacing Threshold Amplitude: 0.625 V
Lead Channel Pacing Threshold Amplitude: 0.625 V
Lead Channel Pacing Threshold Pulse Width: 0.4 ms
Lead Channel Pacing Threshold Pulse Width: 0.4 ms
Lead Channel Sensing Intrinsic Amplitude: 11.2 mV
Lead Channel Setting Pacing Amplitude: 2 V
Lead Channel Setting Sensing Sensitivity: 5.6 mV
MDC IDC LEAD LOCATION: 753859
MDC IDC LEAD SERIAL: 483166
MDC IDC MSMT BATTERY IMPEDANCE: 229 Ohm
MDC IDC MSMT BATTERY VOLTAGE: 2.79 V
MDC IDC MSMT LEADCHNL RA IMPEDANCE VALUE: 537 Ohm
MDC IDC MSMT LEADCHNL RA SENSING INTR AMPL: 2.8 mV
MDC IDC SESS DTM: 20170103183807
MDC IDC SET LEADCHNL RV PACING AMPLITUDE: 2.5 V
MDC IDC SET LEADCHNL RV PACING PULSEWIDTH: 0.4 ms
MDC IDC STAT BRADY AS VS PERCENT: 100 %

## 2015-10-17 LAB — BASIC METABOLIC PANEL
Anion gap: 9 (ref 5–15)
BUN: 14 mg/dL (ref 6–20)
CALCIUM: 10.1 mg/dL (ref 8.9–10.3)
CO2: 28 mmol/L (ref 22–32)
Chloride: 104 mmol/L (ref 101–111)
Creatinine, Ser: 1.05 mg/dL — ABNORMAL HIGH (ref 0.44–1.00)
GFR calc Af Amer: 59 mL/min — ABNORMAL LOW (ref >60)
GFR, EST NON AFRICAN AMERICAN: 51 mL/min — AB (ref >60)
GLUCOSE: 162 mg/dL — AB (ref 65–99)
POTASSIUM: 4.4 mmol/L (ref 3.5–5.1)
Sodium: 141 mmol/L (ref 135–145)

## 2015-10-17 NOTE — H&P (Signed)
Heidi [name] 08/17/2015 9:25 AM Location: [facility] Patient #: [id number] DOB: 10-05-1941 Married / Language: Heidi [name] / Race: White Female   History of Present Illness Heidi [name] B. Hassell Done MD; 08/17/2015 10:17 AM) The patient is a 75 year old female who presents with a complaint of Mass. The mass has been occurring for 12 months. The course has been increasing (the size). It is not sore but as it gets bigger it interfers with dressing and clothes fitting. On exam she has a 6 cm soft nontender mass on the right trapezius more on the back and right neck.   Other Problems Heidi [name], CMA; 08/17/2015 9:25 AM) Atrial Fibrillation Depression Gastroesophageal Reflux Disease Hemorrhoids High blood pressure Hypercholesterolemia Lump In Breast Migraine Headache Sleep Apnea Thyroid Disease  Past Surgical History Heidi [name], CMA; 08/17/2015 9:25 AM) Appendectomy Breast Biopsy Bilateral. multiple Cataract Surgery Left. Gallbladder Surgery - Open Hysterectomy (not due to cancer) - Partial  Diagnostic Studies History Heidi [name], CMA; 08/17/2015 9:25 AM) Colonoscopy 1-5 years ago Mammogram within last year Pap Smear >5 years ago  Allergies Heidi [name], CMA; 08/17/2015 9:35 AM) PenicillAMINE *ASSORTED CLASSES* Lipitor *ANTIHYPERLIPIDEMICS* Nardil *ANTIDEPRESSANTS* Plavix *HEMATOLOGICAL AGENTS - MISC.* Effexor XR *ANTIDEPRESSANTS* Sulfa Antibiotics Simvastatin *ANTIHYPERLIPIDEMICS* Zestril *ANTIHYPERTENSIVES* Crestor *ANTIHYPERLIPIDEMICS* Adhesive 1"x6yd *MEDICAL DEVICES AND SUPPLIES*  Medication History Heidi [name], CMA; 08/17/2015 9:35 AM) Levothyroxine Sodium (75MCG Tablet, Oral) Active. Aspirin (81MG  Tablet Chewable, Oral) Active. Timolol Hemihydrate (0.5% Solution, Ophthalmic) Active. Latanoprost (0.005% Solution, Ophthalmic) Active. Spironolactone (25MG  Tablet, Oral) Active. Vitamin D (2000UNIT Capsule, Oral)  Active. Omeprazole (40MG  Capsule DR, Oral) Active. RaNITidine HCl (300MG  Capsule, Oral) Active. Tricor (145MG  Tablet, Oral) Active. Cetirizine HCl (10MG  Tablet, Oral) Active. Gabapentin (300MG  Capsule, Oral) Active. Nortriptyline HCl (75MG  Capsule, Oral) Active. Pataday (0.2% Solution, Ophthalmic) Active. Fluticasone Propionate (50MCG/ACT Suspension, Nasal) Active. Meclizine HCl (25MG  Tablet, Oral) Active. Lasix (20MG  Tablet, Oral) Active. Medications Reconciled  Social History Heidi [name], CMA; 08/17/2015 9:25 AM) Caffeine use Coffee, Tea. No alcohol use No drug use Tobacco use Former smoker.  Family History Heidi [name], [place]; 08/17/2015 9:25 AM) Breast Cancer Sister. Cervical Cancer Mother. Hypertension Mother.  Pregnancy / Birth History Heidi [name], [place]; 08/17/2015 9:25 AM) Age at menarche 82 years. Age of menopause >72 Gravida 2 Maternal age 61-20 Para 2    Review of Systems (Heidi [name]; 08/17/2015 9:25 AM) General Not Present- Appetite Loss, Chills, Fatigue, Fever, Night Sweats, Weight Gain and Weight Loss. Skin Not Present- Change in Wart/Mole, Dryness, Hives, Jaundice, New Lesions, Non-Healing Wounds, Rash and Ulcer. HEENT Present- Seasonal Allergies and Wears glasses/contact lenses. Not Present- Earache, Hearing Loss, Hoarseness, Nose Bleed, Oral Ulcers, Ringing in the Ears, Sinus Pain, Sore Throat, Visual Disturbances and Yellow Eyes. Respiratory Not Present- Bloody sputum, Chronic Cough, Difficulty Breathing, Snoring and Wheezing. Breast Not Present- Breast Mass, Breast Pain, Nipple Discharge and Skin Changes. Cardiovascular Not Present- Chest Pain, Difficulty Breathing Lying Down, Leg Cramps, Palpitations, Rapid Heart Rate, Shortness of Breath and Swelling of Extremities. Gastrointestinal Present- Constipation and Hemorrhoids. Not Present- Abdominal Pain, Bloating, Bloody Stool, Change in Bowel Habits, Chronic diarrhea, Difficulty  Swallowing, Excessive gas, Gets full quickly at meals, Indigestion, Nausea, Rectal Pain and Vomiting. Female Genitourinary Not Present- Frequency, Nocturia, Painful Urination, Pelvic Pain and Urgency. Musculoskeletal Not Present- Back Pain, Joint Pain, Joint Stiffness, Muscle Pain, Muscle Weakness and Swelling of Extremities. Neurological Not Present- Decreased Memory, Fainting, Headaches, Numbness, Seizures, Tingling, Tremor, Trouble walking and Weakness. Psychiatric Not Present- Anxiety, Bipolar, Change in Sleep Pattern, Depression, Fearful  and Frequent crying. Endocrine Not Present- Cold Intolerance, Excessive Hunger, Hair Changes, Heat Intolerance, Hot flashes and New Diabetes. Hematology Not Present- Easy Bruising, Excessive bleeding, Gland problems, HIV and Persistent Infections.  Vitals (Heidi [name] CMA; 08/17/2015 9:27 AM) 08/17/2015 9:27 AM Weight: 157 lb Height: 62in Body Surface Area: 1.72 m Body Mass Index: 28.72 kg/m  Temp.: 5F(Temporal)  Pulse: 75 (Regular)  BP: 130/76 (Sitting, Left Arm, Standard)       Physical Exam (Heidi Demonbreun B. Hassell Done MD; 08/17/2015 10:17 AM) General Note: HEENT unremarkable Neck no bruits Chest clear Back--there is a 6 cm soft rubbery mass on the trapezius on the right side. moveable and not fixed. nontender     Assessment & Plan Heidi [name] B. Hassell Done MD; 08/17/2015 10:20 AM) LIPOMA OF BACK (D17.1) Impression: 6 cm mass of the right neck/back. Plan excision of mass under LMA/general . She is followed by Heidi [name].

## 2015-10-17 NOTE — Anesthesia Preprocedure Evaluation (Addendum)
Anesthesia Evaluation  Patient identified by MRN, date of birth, ID band Patient awake    Reviewed: Allergy & Precautions, NPO status , Patient's Chart, lab work & pertinent test results  History of Anesthesia Complications Negative for: history of anesthetic complications  Airway Mallampati: III  TM Distance: >3 FB Neck ROM: Full    Dental no notable dental hx. (+) Dental Advisory Given, Chipped,    Pulmonary sleep apnea , former smoker,    Pulmonary exam normal breath sounds clear to auscultation       Cardiovascular hypertension, Pt. on medications + CAD  Normal cardiovascular exam+ dysrhythmias (tachy/brady) Atrial Fibrillation + pacemaker  Rhythm:Regular Rate:Normal  Pacemaker notes indicate no need to do anything with device pre or post surgery   Neuro/Psych  Headaches, PSYCHIATRIC DISORDERS Anxiety Depression    GI/Hepatic Neg liver ROS, GERD  ,  Endo/Other  diabetesHypothyroidism   Renal/GU Renal disease  negative genitourinary   Musculoskeletal negative musculoskeletal ROS (+)   Abdominal   Peds negative pediatric ROS (+)  Hematology negative hematology ROS (+)   Anesthesia Other Findings   Reproductive/Obstetrics negative OB ROS                           Anesthesia Physical Anesthesia Plan  ASA: III  Anesthesia Plan: General   Post-op Pain Management:    Induction: Intravenous  Airway Management Planned: Oral ETT  Additional Equipment:   Intra-op Plan:   Post-operative Plan: Extubation in OR  Informed Consent: I have reviewed the patients History and Physical, chart, labs and discussed the procedure including the risks, benefits and alternatives for the proposed anesthesia with the patient or authorized representative who has indicated his/her understanding and acceptance.   Dental advisory given  Plan Discussed with: CRNA  Anesthesia Plan Comments:         Anesthesia Quick Evaluation

## 2015-10-18 ENCOUNTER — Encounter: Payer: Self-pay | Admitting: Cardiology

## 2015-10-18 ENCOUNTER — Ambulatory Visit (HOSPITAL_BASED_OUTPATIENT_CLINIC_OR_DEPARTMENT_OTHER)
Admission: RE | Admit: 2015-10-18 | Discharge: 2015-10-18 | Disposition: A | Discharge status: Home / Self Care | Payer: Medicare Other | Source: Ambulatory Visit | Attending: Surgery | Admitting: Surgery

## 2015-10-18 ENCOUNTER — Ambulatory Visit (HOSPITAL_BASED_OUTPATIENT_CLINIC_OR_DEPARTMENT_OTHER): Payer: Medicare Other | Admitting: Anesthesiology

## 2015-10-18 ENCOUNTER — Encounter (HOSPITAL_BASED_OUTPATIENT_CLINIC_OR_DEPARTMENT_OTHER)
Admission: RE | Disposition: A | Discharge status: Home / Self Care | Payer: Self-pay | Source: Ambulatory Visit | Attending: Surgery

## 2015-10-18 ENCOUNTER — Encounter (HOSPITAL_BASED_OUTPATIENT_CLINIC_OR_DEPARTMENT_OTHER): Payer: Self-pay | Admitting: *Deleted

## 2015-10-18 DIAGNOSIS — K219 Gastro-esophageal reflux disease without esophagitis: Secondary | ICD-10-CM | POA: Diagnosis not present

## 2015-10-18 DIAGNOSIS — Z87891 Personal history of nicotine dependence: Secondary | ICD-10-CM | POA: Insufficient documentation

## 2015-10-18 DIAGNOSIS — D171 Benign lipomatous neoplasm of skin and subcutaneous tissue of trunk: Secondary | ICD-10-CM | POA: Diagnosis not present

## 2015-10-18 DIAGNOSIS — I10 Essential (primary) hypertension: Secondary | ICD-10-CM | POA: Diagnosis not present

## 2015-10-18 DIAGNOSIS — Z950 Presence of cardiac pacemaker: Secondary | ICD-10-CM | POA: Insufficient documentation

## 2015-10-18 DIAGNOSIS — I4891 Unspecified atrial fibrillation: Secondary | ICD-10-CM | POA: Insufficient documentation

## 2015-10-18 DIAGNOSIS — E119 Type 2 diabetes mellitus without complications: Secondary | ICD-10-CM | POA: Diagnosis not present

## 2015-10-18 DIAGNOSIS — G4730 Sleep apnea, unspecified: Secondary | ICD-10-CM | POA: Diagnosis not present

## 2015-10-18 DIAGNOSIS — Z79899 Other long term (current) drug therapy: Secondary | ICD-10-CM | POA: Insufficient documentation

## 2015-10-18 DIAGNOSIS — Z7982 Long term (current) use of aspirin: Secondary | ICD-10-CM | POA: Insufficient documentation

## 2015-10-18 DIAGNOSIS — F329 Major depressive disorder, single episode, unspecified: Secondary | ICD-10-CM | POA: Insufficient documentation

## 2015-10-18 DIAGNOSIS — E039 Hypothyroidism, unspecified: Secondary | ICD-10-CM | POA: Insufficient documentation

## 2015-10-18 DIAGNOSIS — E7800 Pure hypercholesterolemia, unspecified: Secondary | ICD-10-CM | POA: Diagnosis not present

## 2015-10-18 DIAGNOSIS — I2510 Atherosclerotic heart disease of native coronary artery without angina pectoris: Secondary | ICD-10-CM | POA: Diagnosis not present

## 2015-10-18 DIAGNOSIS — D1721 Benign lipomatous neoplasm of skin and subcutaneous tissue of right arm: Secondary | ICD-10-CM | POA: Diagnosis not present

## 2015-10-18 HISTORY — DX: Anxiety disorder, unspecified: F41.9

## 2015-10-18 HISTORY — DX: Cardiac arrhythmia, unspecified: I49.9

## 2015-10-18 HISTORY — PX: MASS EXCISION: SHX2000

## 2015-10-18 LAB — GLUCOSE, CAPILLARY
GLUCOSE-CAPILLARY: 132 mg/dL — AB (ref 65–99)
Glucose-Capillary: 111 mg/dL — ABNORMAL HIGH (ref 65–99)

## 2015-10-18 SURGERY — EXCISION MASS
Anesthesia: General | Site: Neck | Laterality: Right

## 2015-10-18 MED ORDER — ONDANSETRON HCL 4 MG/2ML IJ SOLN
INTRAMUSCULAR | Status: DC | PRN
Start: 2015-10-18 — End: 2015-10-18
  Administered 2015-10-18: 4 mg via INTRAVENOUS

## 2015-10-18 MED ORDER — LIDOCAINE HCL (CARDIAC) 20 MG/ML IV SOLN
INTRAVENOUS | Status: AC
  Filled 2015-10-18: qty 5

## 2015-10-18 MED ORDER — MIDAZOLAM HCL 2 MG/2ML IJ SOLN: 1.0000 mg | INTRAMUSCULAR | Status: DC | PRN

## 2015-10-18 MED ORDER — ONDANSETRON HCL 4 MG/2ML IJ SOLN
INTRAMUSCULAR | Status: AC
  Filled 2015-10-18: qty 2

## 2015-10-18 MED ORDER — SODIUM CHLORIDE 0.9 % IV SOLN: 250.0000 mL | INTRAVENOUS | Status: DC | PRN

## 2015-10-18 MED ORDER — ONDANSETRON HCL 4 MG/2ML IJ SOLN: 4.0000 mg | Freq: Once | INTRAMUSCULAR | Status: DC | PRN

## 2015-10-18 MED ORDER — FENTANYL CITRATE (PF) 100 MCG/2ML IJ SOLN: 25.0000 ug | INTRAMUSCULAR | Status: DC | PRN

## 2015-10-18 MED ORDER — BACITRACIN-NEOMYCIN-POLYMYXIN 400-5-5000 EX OINT
TOPICAL_OINTMENT | CUTANEOUS | Status: AC
  Filled 2015-10-18: qty 1

## 2015-10-18 MED ORDER — PROPOFOL 10 MG/ML IV BOLUS
INTRAVENOUS | Status: AC
  Filled 2015-10-18: qty 40

## 2015-10-18 MED ORDER — SUCCINYLCHOLINE CHLORIDE 20 MG/ML IJ SOLN
INTRAMUSCULAR | Status: AC
  Filled 2015-10-18: qty 1

## 2015-10-18 MED ORDER — SODIUM CHLORIDE 0.9 % IJ SOLN: 3.0000 mL | INTRAMUSCULAR | Status: DC | PRN

## 2015-10-18 MED ORDER — SODIUM CHLORIDE 0.9 % IJ SOLN: 3.0000 mL | Freq: Two times a day (BID) | INTRAMUSCULAR | Status: DC

## 2015-10-18 MED ORDER — DEXAMETHASONE SODIUM PHOSPHATE 10 MG/ML IJ SOLN
INTRAMUSCULAR | Status: AC
  Filled 2015-10-18: qty 1

## 2015-10-18 MED ORDER — FENTANYL CITRATE (PF) 100 MCG/2ML IJ SOLN
INTRAMUSCULAR | Status: DC | PRN
  Administered 2015-10-18: 50 ug via INTRAVENOUS

## 2015-10-18 MED ORDER — LIDOCAINE HCL (CARDIAC) 20 MG/ML IV SOLN
INTRAVENOUS | Status: DC | PRN
  Administered 2015-10-18: 50 mg via INTRAVENOUS

## 2015-10-18 MED ORDER — PHENYLEPHRINE HCL 10 MG/ML IJ SOLN
INTRAMUSCULAR | Status: AC
  Filled 2015-10-18: qty 1

## 2015-10-18 MED ORDER — CHLORHEXIDINE GLUCONATE 4 % EX LIQD: 1.0000 "application " | Freq: Once | CUTANEOUS | Status: DC

## 2015-10-18 MED ORDER — PHENYLEPHRINE HCL 10 MG/ML IJ SOLN
INTRAMUSCULAR | Status: DC | PRN
  Administered 2015-10-18: 40 ug via INTRAVENOUS

## 2015-10-18 MED ORDER — ACETAMINOPHEN 325 MG PO TABS: 650.0000 mg | ORAL_TABLET | ORAL | Status: DC | PRN

## 2015-10-18 MED ORDER — GLYCOPYRROLATE 0.2 MG/ML IJ SOLN: 0.2000 mg | Freq: Once | INTRAMUSCULAR | Status: DC | PRN

## 2015-10-18 MED ORDER — BUPIVACAINE HCL (PF) 0.5 % IJ SOLN
INTRAMUSCULAR | Status: DC | PRN
  Administered 2015-10-18: 7 mL

## 2015-10-18 MED ORDER — HYDROCODONE-ACETAMINOPHEN 5-325 MG PO TABS: 1.0000 | ORAL_TABLET | ORAL | Status: DC | PRN

## 2015-10-18 MED ORDER — FENTANYL CITRATE (PF) 100 MCG/2ML IJ SOLN: 50.0000 ug | INTRAMUSCULAR | Status: DC | PRN

## 2015-10-18 MED ORDER — PROPOFOL 10 MG/ML IV BOLUS
INTRAVENOUS | Status: DC | PRN
  Administered 2015-10-18: 150 mg via INTRAVENOUS

## 2015-10-18 MED ORDER — SUCCINYLCHOLINE CHLORIDE 20 MG/ML IJ SOLN
INTRAMUSCULAR | Status: DC | PRN
  Administered 2015-10-18: 50 mg via INTRAVENOUS
  Administered 2015-10-18: 25 mg via INTRAVENOUS

## 2015-10-18 MED ORDER — FENTANYL CITRATE (PF) 100 MCG/2ML IJ SOLN
INTRAMUSCULAR | Status: AC
  Filled 2015-10-18: qty 2

## 2015-10-18 MED ORDER — ACETAMINOPHEN 650 MG RE SUPP: 650.0000 mg | RECTAL | Status: DC | PRN

## 2015-10-18 MED ORDER — OXYCODONE HCL 5 MG PO TABS: 5.0000 mg | ORAL_TABLET | ORAL | Status: DC | PRN

## 2015-10-18 MED ORDER — LIDOCAINE HCL (PF) 1 % IJ SOLN
INTRAMUSCULAR | Status: AC
  Filled 2015-10-18: qty 30

## 2015-10-18 MED ORDER — SCOPOLAMINE 1 MG/3DAYS TD PT72: 1.0000 | MEDICATED_PATCH | Freq: Once | TRANSDERMAL | Status: DC | PRN

## 2015-10-18 MED ORDER — BUPIVACAINE HCL (PF) 0.5 % IJ SOLN
INTRAMUSCULAR | Status: AC
  Filled 2015-10-18: qty 30

## 2015-10-18 MED ORDER — DEXAMETHASONE SODIUM PHOSPHATE 4 MG/ML IJ SOLN
INTRAMUSCULAR | Status: DC | PRN
Start: 2015-10-18 — End: 2015-10-18
  Administered 2015-10-18: 10 mg via INTRAVENOUS

## 2015-10-18 MED ORDER — LACTATED RINGERS IV SOLN
INTRAVENOUS | Status: DC
  Administered 2015-10-18 (×2): via INTRAVENOUS

## 2015-10-18 SURGICAL SUPPLY — 48 items
BENZOIN TINCTURE PRP APPL 2/3 (GAUZE/BANDAGES/DRESSINGS) IMPLANT
BLADE CLIPPER SURG (BLADE) IMPLANT
BLADE SURG 15 STRL LF DISP TIS (BLADE) ×1 IMPLANT
BLADE SURG 15 STRL SS (BLADE) ×1
CANISTER SUCT 1200ML W/VALVE (MISCELLANEOUS) IMPLANT
CLEANER CAUTERY TIP 5X5 PAD (MISCELLANEOUS) ×1 IMPLANT
COVER BACK TABLE 60X90IN (DRAPES) ×2 IMPLANT
COVER MAYO STAND STRL (DRAPES) ×2 IMPLANT
DECANTER SPIKE VIAL GLASS SM (MISCELLANEOUS) IMPLANT
DRAPE LAPAROTOMY 100X72 PEDS (DRAPES) IMPLANT
DRAPE U-SHAPE 76X120 STRL (DRAPES) IMPLANT
DRSG TEGADERM 2-3/8X2-3/4 SM (GAUZE/BANDAGES/DRESSINGS) IMPLANT
ELECT REM PT RETURN 9FT ADLT (ELECTROSURGICAL) ×2
ELECTRODE REM PT RTRN 9FT ADLT (ELECTROSURGICAL) ×1 IMPLANT
GLOVE BIO SURGEON STRL SZ7 (GLOVE) ×2 IMPLANT
GLOVE BIO SURGEON STRL SZ8 (GLOVE) ×2 IMPLANT
GLOVE BIOGEL PI IND STRL 7.0 (GLOVE) ×1 IMPLANT
GLOVE BIOGEL PI IND STRL 7.5 (GLOVE) ×1 IMPLANT
GLOVE BIOGEL PI INDICATOR 7.0 (GLOVE) ×1
GLOVE BIOGEL PI INDICATOR 7.5 (GLOVE) ×1
GOWN STRL REUS W/ TWL LRG LVL3 (GOWN DISPOSABLE) ×1 IMPLANT
GOWN STRL REUS W/ TWL XL LVL3 (GOWN DISPOSABLE) ×1 IMPLANT
GOWN STRL REUS W/TWL LRG LVL3 (GOWN DISPOSABLE) ×1
GOWN STRL REUS W/TWL XL LVL3 (GOWN DISPOSABLE) ×2
LIQUID BAND (GAUZE/BANDAGES/DRESSINGS) IMPLANT
NEEDLE HYPO 25X1 1.5 SAFETY (NEEDLE) ×2 IMPLANT
NEEDLE PRECISIONGLIDE 27X1.5 (NEEDLE) IMPLANT
NS IRRIG 1000ML POUR BTL (IV SOLUTION) IMPLANT
PACK BASIN DAY SURGERY FS (CUSTOM PROCEDURE TRAY) ×2 IMPLANT
PAD CLEANER CAUTERY TIP 5X5 (MISCELLANEOUS) ×1
PENCIL BUTTON HOLSTER BLD 10FT (ELECTRODE) ×2 IMPLANT
SPONGE GAUZE 2X2 8PLY STRL LF (GAUZE/BANDAGES/DRESSINGS) ×4 IMPLANT
SPONGE GAUZE 4X4 12PLY STER LF (GAUZE/BANDAGES/DRESSINGS) ×2 IMPLANT
STRIP CLOSURE SKIN 1/2X4 (GAUZE/BANDAGES/DRESSINGS) IMPLANT
SUT ETHILON 3 0 FSL (SUTURE) IMPLANT
SUT ETHILON 5 0 PS 2 18 (SUTURE) IMPLANT
SUT VIC AB 3-0 SH 27 (SUTURE)
SUT VIC AB 3-0 SH 27X BRD (SUTURE) IMPLANT
SUT VIC AB 4-0 SH 18 (SUTURE) ×2 IMPLANT
SUT VIC AB 5-0 PS2 18 (SUTURE) IMPLANT
SUT VICRYL 3-0 CR8 SH (SUTURE) IMPLANT
SYR BULB 3OZ (MISCELLANEOUS) ×2 IMPLANT
SYR CONTROL 10ML LL (SYRINGE) ×2 IMPLANT
TOWEL OR 17X24 6PK STRL BLUE (TOWEL DISPOSABLE) ×4 IMPLANT
TRAY DSU PREP LF (CUSTOM PROCEDURE TRAY) ×2 IMPLANT
TUBE CONNECTING 20X1/4 (TUBING) ×2 IMPLANT
UNDERPAD 30X30 (UNDERPADS AND DIAPERS) ×2 IMPLANT
YANKAUER SUCT BULB TIP NO VENT (SUCTIONS) ×2 IMPLANT

## 2015-10-18 NOTE — Anesthesia Postprocedure Evaluation (Signed)
Anesthesia Post Note  Patient: Heidi [name]  Procedure(s) Performed: Procedure(s) (LRB): EXCISION OF 6CM MASS ON RIGHT  NECK/BACK (Right)  Patient location during evaluation: PACU Anesthesia Type: General Level of consciousness: awake and alert Pain management: pain level controlled Vital Signs Assessment: post-procedure vital signs reviewed and stable Respiratory status: spontaneous breathing, nonlabored ventilation, respiratory function stable and patient connected to nasal cannula oxygen Cardiovascular status: blood pressure returned to baseline and stable Postop Assessment: no signs of nausea or vomiting Anesthetic complications: no    Last Vitals:  Filed Vitals:   10/18/15 1015 10/18/15 1028  BP: 133/78 134/68  Pulse: 79 85  Temp:  36.7 C  Resp: 14 16    Last Pain:  Filed Vitals:   10/18/15 1030  PainSc: 0-No pain                 Heidi [name]

## 2015-10-18 NOTE — Interval H&P Note (Signed)
History and Physical Interval Note:  10/18/2015 8:27 AM  Heidi [name]  has presented today for surgery, with the diagnosis of 6cm PROBABLE LIPOMA  The various methods of treatment have been discussed with the patient and family. After consideration of risks, benefits and other options for treatment, the patient has consented to  Procedure(s): EXCISION OF 6CM MASS ON RIGHT  NECK/BACK (Right) as a surgical intervention .  The patient's history has been reviewed, patient examined, no change in status, stable for surgery.  I have reviewed the patient's chart and labs.  Questions were answered to the patient's satisfaction.     Novalyn Lajara B

## 2015-10-18 NOTE — Op Note (Signed)
Surgeon: Kaylyn Lim, MD, FACS  Asst:  none  Anes:  general  Procedure: Excision of lipomatous mass on the right shoulder  Diagnosis: lipoma  Complications: none  EBL:   4 cc  Drains: none  Description of Procedure:  The patient was taken to OR 8 at [facility].  After anesthesia was administered and the patient was prepped a timeout was performed.  Positioning was over in left decubitus.  Area previously marked with sharpie.  Incision made and carried into the mass.  This was not a well demarcated mass but rather multilobulated.  It was removed with Truett Main[place] dissection and blunt dissection to deliver the mass and remove it.  I did not go down to the muscle fascia layer but stayed more superficial to avoid any branches of the spinal accessory nerve.  When dissection was complete, I closed the incision with 4-0 vicryl in layers and with Liquiban.    The patient tolerated the procedure well and was taken to the PACU in stable condition.     Matt B. Hassell Done, [place], [facility], [place]

## 2015-10-18 NOTE — H&P (View-Only) (Signed)
[name] 08/17/2015 9:25 AM Location: [facility] Patient #: [id number] DOB: Dec 19, 1940 Married / Language: [name] / Race: White Female   History of Present Illness [name] MD; 08/17/2015 10:17 AM) The patient is a 75 year old female who presents with a complaint of Mass. The mass has been occurring for 12 months. The course has been increasing (the size). It is not sore but as it gets bigger it interfers with dressing and clothes fitting. On exam she has a 6 cm soft nontender mass on the right trapezius more on the back and right neck.   Other Problems [name], CMA; 08/17/2015 9:25 AM) Atrial Fibrillation Depression Gastroesophageal Reflux Disease Hemorrhoids High blood pressure Hypercholesterolemia Lump In Breast Migraine Headache Sleep Apnea Thyroid Disease  Past Surgical History [name], CMA; 08/17/2015 9:25 AM) Appendectomy Breast Biopsy Bilateral. multiple Cataract Surgery Left. Gallbladder Surgery - Open Hysterectomy (not due to cancer) - Partial  Diagnostic Studies History [name], CMA; 08/17/2015 9:25 AM) Colonoscopy 1-5 years ago Mammogram within last year Pap Smear >5 years ago  Allergies [name], CMA; 08/17/2015 9:35 AM) PenicillAMINE *ASSORTED CLASSES* Lipitor *ANTIHYPERLIPIDEMICS* Nardil *ANTIDEPRESSANTS* Plavix *HEMATOLOGICAL AGENTS - MISC.* Effexor XR *ANTIDEPRESSANTS* Sulfa Antibiotics Simvastatin *ANTIHYPERLIPIDEMICS* Zestril *ANTIHYPERTENSIVES* Crestor *ANTIHYPERLIPIDEMICS* Adhesive 1"x6yd *MEDICAL DEVICES AND SUPPLIES*  Medication History [name], CMA; 08/17/2015 9:35 AM) Levothyroxine Sodium (75MCG Tablet, Oral) Active. Aspirin (81MG  Tablet Chewable, Oral) Active. Timolol Hemihydrate (0.5% Solution, Ophthalmic) Active. Latanoprost (0.005% Solution, Ophthalmic) Active. Spironolactone (25MG  Tablet, Oral) Active. Vitamin D (2000UNIT Capsule, Oral)  Active. Omeprazole (40MG  Capsule DR, Oral) Active. RaNITidine HCl (300MG  Capsule, Oral) Active. Tricor (145MG  Tablet, Oral) Active. Cetirizine HCl (10MG  Tablet, Oral) Active. Gabapentin (300MG  Capsule, Oral) Active. Nortriptyline HCl (75MG  Capsule, Oral) Active. Pataday (0.2% Solution, Ophthalmic) Active. Fluticasone Propionate (50MCG/ACT Suspension, Nasal) Active. Meclizine HCl (25MG  Tablet, Oral) Active. Lasix (20MG  Tablet, Oral) Active. Medications Reconciled  Social History [name], CMA; 08/17/2015 9:25 AM) Caffeine use Coffee, Tea. No alcohol use No drug use Tobacco use Former smoker.  Family History [name], [place]; 08/17/2015 9:25 AM) Breast Cancer Sister. Cervical Cancer Mother. Hypertension Mother.  Pregnancy / Birth History [name], [place]; 08/17/2015 9:25 AM) Age at menarche 4 years. Age of menopause >78 Gravida 2 Maternal age 83-20 Para 2    Review of Systems ([place]; 08/17/2015 9:25 AM) General Not Present- Appetite Loss, Chills, Fatigue, Fever, Night Sweats, Weight Gain and Weight Loss. Skin Not Present- Change in Wart/Mole, Dryness, Hives, Jaundice, New Lesions, Non-Healing Wounds, Rash and Ulcer. HEENT Present- Seasonal Allergies and Wears glasses/contact lenses. Not Present- Earache, Hearing Loss, Hoarseness, Nose Bleed, Oral Ulcers, Ringing in the Ears, Sinus Pain, Sore Throat, Visual Disturbances and Yellow Eyes. Respiratory Not Present- Bloody sputum, Chronic Cough, Difficulty Breathing, Snoring and Wheezing. Breast Not Present- Breast Mass, Breast Pain, Nipple Discharge and Skin Changes. Cardiovascular Not Present- Chest Pain, Difficulty Breathing Lying Down, Leg Cramps, Palpitations, Rapid Heart Rate, Shortness of Breath and Swelling of Extremities. Gastrointestinal Present- Constipation and Hemorrhoids. Not Present- Abdominal Pain, Bloating, Bloody Stool, Change in Bowel Habits, Chronic diarrhea, Difficulty  Swallowing, Excessive gas, Gets full quickly at meals, Indigestion, Nausea, Rectal Pain and Vomiting. Female Genitourinary Not Present- Frequency, Nocturia, Painful Urination, Pelvic Pain and Urgency. Musculoskeletal Not Present- Back Pain, Joint Pain, Joint Stiffness, Muscle Pain, Muscle Weakness and Swelling of Extremities. Neurological Not Present- Decreased Memory, Fainting, Headaches, Numbness, Seizures, Tingling, Tremor, Trouble walking and Weakness. Psychiatric Not Present- Anxiety, Bipolar, Change in Sleep Pattern, Depression, Fearful  and Frequent crying. Endocrine Not Present- Cold Intolerance, Excessive Hunger, Hair Changes, Heat Intolerance, Hot flashes and New Diabetes. Hematology Not Present- Easy Bruising, Excessive bleeding, Gland problems, HIV and Persistent Infections.  Vitals ([name] CMA; 08/17/2015 9:27 AM) 08/17/2015 9:27 AM Weight: 157 lb Height: 62in Body Surface Area: 1.72 m Body Mass Index: 28.72 kg/m  Temp.: 30F(Temporal)  Pulse: 75 (Regular)  BP: 130/76 (Sitting, Left Arm, Standard)       Physical Exam (Esthefany Herrig B. Hassell Done MD; 08/17/2015 10:17 AM) General Note: HEENT unremarkable Neck no bruits Chest clear Back--there is a 6 cm soft rubbery mass on the trapezius on the right side. moveable and not fixed. nontender     Assessment & Plan [name] MD; 08/17/2015 10:20 AM) LIPOMA OF BACK (D17.1) Impression: 6 cm mass of the right neck/back. Plan excision of mass under LMA/general . She is followed by Dr. Deforest Hoyles.

## 2015-10-18 NOTE — Anesthesia Procedure Notes (Signed)
Procedure Name: Intubation Performed by: Marrianne Mood Pre-anesthesia Checklist: Patient identified, Emergency Drugs available, Suction available, Patient being monitored and Timeout performed Patient Re-evaluated:Patient Re-evaluated prior to inductionOxygen Delivery Method: Circle System Utilized Preoxygenation: Pre-oxygenation with 100% oxygen Intubation Type: IV induction Ventilation: Mask ventilation without difficulty Laryngoscope Size: Miller and 3 Grade View: Grade III Tube type: Oral Tube size: 7.0 mm Number of attempts: 1 Airway Equipment and Method: Stylet and Oral airway Placement Confirmation: ETT inserted through vocal cords under direct vision,  positive ETCO2 and breath sounds checked- equal and bilateral Secured at: 21 cm Tube secured with: Tape Dental Injury: Teeth and Oropharynx as per pre-operative assessment

## 2015-10-18 NOTE — Discharge Instructions (Signed)
May shower and get wet tomorrow. Expect some bruising around the excision site.   Call your surgeon if you experience:   1.  Fever over 101.0. 2.  Inability to urinate. 3.  Nausea and/or vomiting. 4.  Extreme swelling or bruising at the surgical site. 5.  Continued bleeding from the incision. 6.  Increased pain, redness or drainage from the incision. 7.  Problems related to your pain medication. 8. Any change in color, movement and/or sensation 9. Any problems and/or concerns  Post Anesthesia Home Care Instructions  Activity: Get plenty of rest for the remainder of the day. A responsible adult should stay with you for 24 hours following the procedure.  For the next 24 hours, DO NOT: -Drive a car -Paediatric nurse -Drink alcoholic beverages -Take any medication unless instructed by your physician -Make any legal decisions or sign important papers.  Meals: Start with liquid foods such as gelatin or soup. Progress to regular foods as tolerated. Avoid greasy, spicy, heavy foods. If nausea and/or vomiting occur, drink only clear liquids until the nausea and/or vomiting subsides. Call your physician if vomiting continues.  Special Instructions/Symptoms: Your throat may feel dry or sore from the anesthesia or the breathing tube placed in your throat during surgery. If this causes discomfort, gargle with warm salt water. The discomfort should disappear within 24 hours.  If you had a scopolamine patch placed behind your ear for the management of post- operative nausea and/or vomiting:  1. The medication in the patch is effective for 72 hours, after which it should be removed.  Wrap patch in a tissue and discard in the trash. Wash hands thoroughly with soap and water. 2. You may remove the patch earlier than 72 hours if you experience unpleasant side effects which may include dry mouth, dizziness or visual disturbances. 3. Avoid touching the patch. Wash your hands with soap and water after  contact with the patch.

## 2015-10-18 NOTE — Transfer of Care (Signed)
Immediate Anesthesia Transfer of Care Note  Patient: Heidi [name]  Procedure(s) Performed: Procedure(s): EXCISION OF 6CM MASS ON RIGHT  NECK/BACK (Right)  Patient Location: PACU  Anesthesia Type:General  Level of Consciousness: awake and patient cooperative  Airway & Oxygen Therapy: Patient Spontanous Breathing and Patient connected to face mask oxygen  Post-op Assessment: Report given to RN and Post -op Vital signs reviewed and stable  Post vital signs: Reviewed and stable  Last Vitals:  Filed Vitals:   10/18/15 0733  BP: 142/80  Pulse: 77  Temp: 36.7 C  Resp: 20    Complications: No apparent anesthesia complications

## 2015-10-19 ENCOUNTER — Encounter (HOSPITAL_BASED_OUTPATIENT_CLINIC_OR_DEPARTMENT_OTHER): Payer: Self-pay | Admitting: Surgery

## 2015-10-24 ENCOUNTER — Ambulatory Visit
Admission: RE | Admit: 2015-10-24 | Discharge: 2015-10-24 | Disposition: A | Discharge status: Home / Self Care | Payer: Medicare Other | Source: Ambulatory Visit | Attending: Internal Medicine | Admitting: Internal Medicine

## 2015-10-24 ENCOUNTER — Other Ambulatory Visit: Payer: Self-pay | Admitting: Internal Medicine

## 2015-10-24 DIAGNOSIS — R0981 Nasal congestion: Secondary | ICD-10-CM | POA: Diagnosis not present

## 2015-10-24 DIAGNOSIS — J341 Cyst and mucocele of nose and nasal sinus: Secondary | ICD-10-CM | POA: Diagnosis not present

## 2015-10-24 DIAGNOSIS — R519 Headache, unspecified: Secondary | ICD-10-CM

## 2015-10-24 DIAGNOSIS — R51 Headache: Principal | ICD-10-CM

## 2015-10-27 DIAGNOSIS — J342 Deviated nasal septum: Secondary | ICD-10-CM | POA: Diagnosis not present

## 2015-10-27 DIAGNOSIS — R51 Headache: Secondary | ICD-10-CM | POA: Diagnosis not present

## 2015-10-27 DIAGNOSIS — J341 Cyst and mucocele of nose and nasal sinus: Secondary | ICD-10-CM | POA: Diagnosis not present

## 2015-11-13 DIAGNOSIS — H401422 Capsular glaucoma with pseudoexfoliation of lens, left eye, moderate stage: Secondary | ICD-10-CM | POA: Diagnosis not present

## 2015-11-13 DIAGNOSIS — H401412 Capsular glaucoma with pseudoexfoliation of lens, right eye, moderate stage: Secondary | ICD-10-CM | POA: Diagnosis not present

## 2015-12-12 ENCOUNTER — Telehealth: Payer: Self-pay | Admitting: Internal Medicine

## 2015-12-12 DIAGNOSIS — H2511 Age-related nuclear cataract, right eye: Secondary | ICD-10-CM | POA: Diagnosis not present

## 2015-12-12 DIAGNOSIS — H26492 Other secondary cataract, left eye: Secondary | ICD-10-CM | POA: Diagnosis not present

## 2015-12-12 NOTE — Telephone Encounter (Signed)
Request for surgical clearance:  What type of surgery is being performed? Eye Surgery  1. When is this surgery scheduled? 12/26/15   2. Are there any medications that need to be held prior to surgery and how long?Aspirin    3. Name of physician performing surgery? Dr. Edilia Bo and Dr. Tonia Ghent   4. What is your office phone and fax number? Office [phone]

## 2015-12-14 ENCOUNTER — Encounter: Payer: Self-pay | Admitting: *Deleted

## 2015-12-14 NOTE — Telephone Encounter (Signed)
Reviewed with Dr. Caryl Comes- ok to hold ASA x 7 days.

## 2015-12-14 NOTE — Telephone Encounter (Signed)
Letter faxed to Dr. Edilia Bo Dr. Tonia Ghent at [phone] regarding aspirin. Confirmation received.

## 2015-12-26 DIAGNOSIS — G4733 Obstructive sleep apnea (adult) (pediatric): Secondary | ICD-10-CM | POA: Diagnosis not present

## 2015-12-26 DIAGNOSIS — Z79899 Other long term (current) drug therapy: Secondary | ICD-10-CM | POA: Diagnosis not present

## 2015-12-26 DIAGNOSIS — H401412 Capsular glaucoma with pseudoexfoliation of lens, right eye, moderate stage: Secondary | ICD-10-CM | POA: Diagnosis not present

## 2015-12-26 DIAGNOSIS — H4089 Other specified glaucoma: Secondary | ICD-10-CM | POA: Diagnosis not present

## 2015-12-26 DIAGNOSIS — Z9989 Dependence on other enabling machines and devices: Secondary | ICD-10-CM | POA: Diagnosis not present

## 2015-12-26 DIAGNOSIS — F339 Major depressive disorder, recurrent, unspecified: Secondary | ICD-10-CM | POA: Diagnosis not present

## 2015-12-26 DIAGNOSIS — H5703 Miosis: Secondary | ICD-10-CM | POA: Diagnosis not present

## 2015-12-26 DIAGNOSIS — K219 Gastro-esophageal reflux disease without esophagitis: Secondary | ICD-10-CM | POA: Diagnosis not present

## 2015-12-26 DIAGNOSIS — Z7984 Long term (current) use of oral hypoglycemic drugs: Secondary | ICD-10-CM | POA: Diagnosis not present

## 2015-12-26 DIAGNOSIS — H2511 Age-related nuclear cataract, right eye: Secondary | ICD-10-CM | POA: Diagnosis not present

## 2015-12-26 DIAGNOSIS — G629 Polyneuropathy, unspecified: Secondary | ICD-10-CM | POA: Diagnosis not present

## 2015-12-26 DIAGNOSIS — I480 Paroxysmal atrial fibrillation: Secondary | ICD-10-CM | POA: Diagnosis not present

## 2015-12-26 DIAGNOSIS — G450 Vertebro-basilar artery syndrome: Secondary | ICD-10-CM | POA: Diagnosis not present

## 2015-12-26 DIAGNOSIS — Z955 Presence of coronary angioplasty implant and graft: Secondary | ICD-10-CM | POA: Diagnosis not present

## 2015-12-26 DIAGNOSIS — E785 Hyperlipidemia, unspecified: Secondary | ICD-10-CM | POA: Diagnosis not present

## 2015-12-26 DIAGNOSIS — Z90710 Acquired absence of both cervix and uterus: Secondary | ICD-10-CM | POA: Diagnosis not present

## 2015-12-26 DIAGNOSIS — R413 Other amnesia: Secondary | ICD-10-CM | POA: Diagnosis not present

## 2015-12-26 DIAGNOSIS — E119 Type 2 diabetes mellitus without complications: Secondary | ICD-10-CM | POA: Diagnosis not present

## 2015-12-26 DIAGNOSIS — I495 Sick sinus syndrome: Secondary | ICD-10-CM | POA: Diagnosis not present

## 2015-12-26 DIAGNOSIS — Z87891 Personal history of nicotine dependence: Secondary | ICD-10-CM | POA: Diagnosis not present

## 2015-12-26 DIAGNOSIS — I10 Essential (primary) hypertension: Secondary | ICD-10-CM | POA: Diagnosis not present

## 2015-12-26 DIAGNOSIS — E039 Hypothyroidism, unspecified: Secondary | ICD-10-CM | POA: Diagnosis not present

## 2015-12-26 DIAGNOSIS — Z7982 Long term (current) use of aspirin: Secondary | ICD-10-CM | POA: Diagnosis not present

## 2016-01-01 DIAGNOSIS — Z7984 Long term (current) use of oral hypoglycemic drugs: Secondary | ICD-10-CM | POA: Diagnosis not present

## 2016-01-01 DIAGNOSIS — Z87891 Personal history of nicotine dependence: Secondary | ICD-10-CM | POA: Diagnosis not present

## 2016-01-01 DIAGNOSIS — Z48810 Encounter for surgical aftercare following surgery on the sense organs: Secondary | ICD-10-CM | POA: Diagnosis not present

## 2016-01-01 DIAGNOSIS — H401412 Capsular glaucoma with pseudoexfoliation of lens, right eye, moderate stage: Secondary | ICD-10-CM | POA: Diagnosis not present

## 2016-01-01 DIAGNOSIS — Z79899 Other long term (current) drug therapy: Secondary | ICD-10-CM | POA: Diagnosis not present

## 2016-01-01 DIAGNOSIS — H401422 Capsular glaucoma with pseudoexfoliation of lens, left eye, moderate stage: Secondary | ICD-10-CM | POA: Diagnosis not present

## 2016-01-01 DIAGNOSIS — Z888 Allergy status to other drugs, medicaments and biological substances status: Secondary | ICD-10-CM | POA: Diagnosis not present

## 2016-01-01 DIAGNOSIS — Z7982 Long term (current) use of aspirin: Secondary | ICD-10-CM | POA: Diagnosis not present

## 2016-01-08 DIAGNOSIS — F3342 Major depressive disorder, recurrent, in full remission: Secondary | ICD-10-CM | POA: Diagnosis not present

## 2016-01-09 ENCOUNTER — Telehealth: Payer: Self-pay | Admitting: Cardiology

## 2016-01-09 ENCOUNTER — Ambulatory Visit (INDEPENDENT_AMBULATORY_CARE_PROVIDER_SITE_OTHER): Payer: Medicare Other | Admitting: *Deleted

## 2016-01-09 DIAGNOSIS — I495 Sick sinus syndrome: Secondary | ICD-10-CM

## 2016-01-09 NOTE — Telephone Encounter (Signed)
Spoke with pt and reminded pt of remote transmission that is due today. Pt verbalized understanding.   

## 2016-01-09 NOTE — Progress Notes (Signed)
Remote pacemaker transmission.   

## 2016-01-22 DIAGNOSIS — F329 Major depressive disorder, single episode, unspecified: Secondary | ICD-10-CM | POA: Diagnosis not present

## 2016-01-22 DIAGNOSIS — H409 Unspecified glaucoma: Secondary | ICD-10-CM | POA: Diagnosis not present

## 2016-01-23 DIAGNOSIS — H401432 Capsular glaucoma with pseudoexfoliation of lens, bilateral, moderate stage: Secondary | ICD-10-CM | POA: Diagnosis not present

## 2016-01-23 DIAGNOSIS — Z87891 Personal history of nicotine dependence: Secondary | ICD-10-CM | POA: Diagnosis not present

## 2016-02-09 DIAGNOSIS — H401412 Capsular glaucoma with pseudoexfoliation of lens, right eye, moderate stage: Secondary | ICD-10-CM | POA: Diagnosis not present

## 2016-02-09 DIAGNOSIS — H401422 Capsular glaucoma with pseudoexfoliation of lens, left eye, moderate stage: Secondary | ICD-10-CM | POA: Diagnosis not present

## 2016-02-09 DIAGNOSIS — Z48810 Encounter for surgical aftercare following surgery on the sense organs: Secondary | ICD-10-CM | POA: Diagnosis not present

## 2016-02-19 ENCOUNTER — Other Ambulatory Visit: Payer: Self-pay | Admitting: Internal Medicine

## 2016-02-19 DIAGNOSIS — E1122 Type 2 diabetes mellitus with diabetic chronic kidney disease: Secondary | ICD-10-CM | POA: Diagnosis not present

## 2016-02-19 DIAGNOSIS — F329 Major depressive disorder, single episode, unspecified: Secondary | ICD-10-CM | POA: Diagnosis not present

## 2016-02-19 DIAGNOSIS — E782 Mixed hyperlipidemia: Secondary | ICD-10-CM | POA: Diagnosis not present

## 2016-02-19 DIAGNOSIS — I2510 Atherosclerotic heart disease of native coronary artery without angina pectoris: Secondary | ICD-10-CM | POA: Diagnosis not present

## 2016-02-19 DIAGNOSIS — K219 Gastro-esophageal reflux disease without esophagitis: Secondary | ICD-10-CM | POA: Diagnosis not present

## 2016-02-19 DIAGNOSIS — Z1389 Encounter for screening for other disorder: Secondary | ICD-10-CM | POA: Diagnosis not present

## 2016-02-19 DIAGNOSIS — I10 Essential (primary) hypertension: Secondary | ICD-10-CM | POA: Diagnosis not present

## 2016-02-19 DIAGNOSIS — Z0000 Encounter for general adult medical examination without abnormal findings: Secondary | ICD-10-CM | POA: Diagnosis not present

## 2016-02-19 DIAGNOSIS — G629 Polyneuropathy, unspecified: Secondary | ICD-10-CM | POA: Diagnosis not present

## 2016-02-19 DIAGNOSIS — E039 Hypothyroidism, unspecified: Secondary | ICD-10-CM | POA: Diagnosis not present

## 2016-02-19 DIAGNOSIS — Z7984 Long term (current) use of oral hypoglycemic drugs: Secondary | ICD-10-CM | POA: Diagnosis not present

## 2016-02-19 DIAGNOSIS — N63 Unspecified lump in unspecified breast: Secondary | ICD-10-CM

## 2016-02-20 LAB — CUP PACEART REMOTE DEVICE CHECK
Brady Statistic AP VS Percent: 1 %
Brady Statistic AS VS Percent: 99 %
Implantable Lead Implant Date: 20070710
Implantable Lead Location: 753859
Lead Channel Impedance Value: 537 Ohm
Lead Channel Impedance Value: 647 Ohm
Lead Channel Pacing Threshold Pulse Width: 0.4 ms
Lead Channel Sensing Intrinsic Amplitude: 11.2 mV
Lead Channel Setting Pacing Amplitude: 2 V
Lead Channel Setting Pacing Pulse Width: 0.4 ms
MDC IDC LEAD IMPLANT DT: 20070710
MDC IDC LEAD LOCATION: 753860
MDC IDC LEAD SERIAL: 483166
MDC IDC LEAD SERIAL: 541525
MDC IDC MSMT BATTERY IMPEDANCE: 229 Ohm
MDC IDC MSMT BATTERY REMAINING LONGEVITY: 130 mo
MDC IDC MSMT BATTERY VOLTAGE: 2.79 V
MDC IDC MSMT LEADCHNL RA PACING THRESHOLD AMPLITUDE: 0.75 V
MDC IDC MSMT LEADCHNL RA PACING THRESHOLD PULSEWIDTH: 0.4 ms
MDC IDC MSMT LEADCHNL RA SENSING INTR AMPL: 2.8 mV
MDC IDC MSMT LEADCHNL RV PACING THRESHOLD AMPLITUDE: 0.875 V
MDC IDC SESS DTM: 20170404143312
MDC IDC SET LEADCHNL RV PACING AMPLITUDE: 2.5 V
MDC IDC SET LEADCHNL RV SENSING SENSITIVITY: 5.6 mV
MDC IDC STAT BRADY AP VP PERCENT: 0 %
MDC IDC STAT BRADY AS VP PERCENT: 0 %

## 2016-02-21 ENCOUNTER — Encounter: Payer: Self-pay | Admitting: Cardiology

## 2016-04-01 DIAGNOSIS — H26492 Other secondary cataract, left eye: Secondary | ICD-10-CM | POA: Diagnosis not present

## 2016-04-01 DIAGNOSIS — Z961 Presence of intraocular lens: Secondary | ICD-10-CM | POA: Diagnosis not present

## 2016-04-10 ENCOUNTER — Ambulatory Visit (INDEPENDENT_AMBULATORY_CARE_PROVIDER_SITE_OTHER): Payer: Medicare Other | Admitting: *Deleted

## 2016-04-10 ENCOUNTER — Telehealth: Payer: Self-pay | Admitting: Cardiology

## 2016-04-10 DIAGNOSIS — I495 Sick sinus syndrome: Secondary | ICD-10-CM

## 2016-04-10 NOTE — Telephone Encounter (Signed)
Spoke with pt and reminded pt of remote transmission that is due today. Pt verbalized understanding.   

## 2016-04-12 ENCOUNTER — Encounter: Payer: Self-pay | Admitting: Cardiology

## 2016-04-16 NOTE — Progress Notes (Signed)
Remote pacemaker transmission.   

## 2016-04-17 ENCOUNTER — Encounter: Payer: Self-pay | Admitting: Cardiology

## 2016-04-17 LAB — CUP PACEART REMOTE DEVICE CHECK
Battery Impedance: 253 Ohm
Battery Remaining Longevity: 136 mo
Battery Voltage: 2.8 V
Brady Statistic AS VP Percent: 0 %
Implantable Lead Implant Date: 20070710
Implantable Lead Location: 753859
Implantable Lead Model: 4469
Implantable Lead Model: 4470
Implantable Lead Serial Number: 541525
Lead Channel Impedance Value: 539 Ohm
Lead Channel Pacing Threshold Amplitude: 0.625 V
Lead Channel Pacing Threshold Pulse Width: 0.4 ms
Lead Channel Pacing Threshold Pulse Width: 0.4 ms
Lead Channel Sensing Intrinsic Amplitude: 2.8 mV
Lead Channel Setting Pacing Amplitude: 2 V
Lead Channel Setting Pacing Amplitude: 2.5 V
MDC IDC LEAD IMPLANT DT: 20070710
MDC IDC LEAD LOCATION: 753860
MDC IDC LEAD SERIAL: 483166
MDC IDC MSMT LEADCHNL RV IMPEDANCE VALUE: 662 Ohm
MDC IDC MSMT LEADCHNL RV PACING THRESHOLD AMPLITUDE: 0.75 V
MDC IDC MSMT LEADCHNL RV SENSING INTR AMPL: 11.2 mV
MDC IDC SESS DTM: 20170707181748
MDC IDC SET LEADCHNL RV PACING PULSEWIDTH: 0.4 ms
MDC IDC SET LEADCHNL RV SENSING SENSITIVITY: 5.6 mV
MDC IDC STAT BRADY AP VP PERCENT: 0 %
MDC IDC STAT BRADY AP VS PERCENT: 0 %
MDC IDC STAT BRADY AS VS PERCENT: 100 %

## 2016-04-24 DIAGNOSIS — M8588 Other specified disorders of bone density and structure, other site: Secondary | ICD-10-CM | POA: Diagnosis not present

## 2016-04-25 ENCOUNTER — Ambulatory Visit
Admission: RE | Admit: 2016-04-25 | Discharge: 2016-04-25 | Disposition: A | Discharge status: Home / Self Care | Payer: Medicare Other | Source: Ambulatory Visit | Attending: Internal Medicine | Admitting: Internal Medicine

## 2016-04-25 DIAGNOSIS — N63 Unspecified lump in unspecified breast: Secondary | ICD-10-CM

## 2016-05-17 DIAGNOSIS — H26493 Other secondary cataract, bilateral: Secondary | ICD-10-CM | POA: Diagnosis not present

## 2016-05-27 DIAGNOSIS — H401422 Capsular glaucoma with pseudoexfoliation of lens, left eye, moderate stage: Secondary | ICD-10-CM | POA: Diagnosis not present

## 2016-05-27 DIAGNOSIS — H401412 Capsular glaucoma with pseudoexfoliation of lens, right eye, moderate stage: Secondary | ICD-10-CM | POA: Diagnosis not present

## 2016-06-04 ENCOUNTER — Encounter: Payer: Self-pay | Admitting: Cardiology

## 2016-06-07 DIAGNOSIS — Z23 Encounter for immunization: Secondary | ICD-10-CM | POA: Diagnosis not present

## 2016-06-14 ENCOUNTER — Other Ambulatory Visit: Payer: Self-pay | Admitting: Internal Medicine

## 2016-06-14 ENCOUNTER — Encounter: Payer: Self-pay | Admitting: Internal Medicine

## 2016-06-14 DIAGNOSIS — H26491 Other secondary cataract, right eye: Secondary | ICD-10-CM | POA: Diagnosis not present

## 2016-06-18 ENCOUNTER — Ambulatory Visit (INDEPENDENT_AMBULATORY_CARE_PROVIDER_SITE_OTHER): Payer: Medicare Other | Admitting: *Deleted

## 2016-06-18 ENCOUNTER — Encounter: Payer: Self-pay | Admitting: Cardiology

## 2016-06-18 ENCOUNTER — Ambulatory Visit (INDEPENDENT_AMBULATORY_CARE_PROVIDER_SITE_OTHER): Payer: Medicare Other | Admitting: Cardiology

## 2016-06-18 VITALS — BP 126/84 | HR 96 | Ht 62.0 in | Wt 153.2 lb

## 2016-06-18 DIAGNOSIS — R5383 Other fatigue: Secondary | ICD-10-CM

## 2016-06-18 DIAGNOSIS — I495 Sick sinus syndrome: Secondary | ICD-10-CM | POA: Diagnosis not present

## 2016-06-18 DIAGNOSIS — R0600 Dyspnea, unspecified: Secondary | ICD-10-CM | POA: Diagnosis not present

## 2016-06-18 NOTE — Patient Instructions (Signed)
Medication Instructions:  The current medical regimen is effective;  continue present plan and medications.  Labwork: Please have blood work today. (BMP, CBC, TSH and Free T4)  Testing/Procedures: Your physician has requested that you have an echocardiogram. Echocardiography is a painless test that uses sound waves to create images of your heart. It provides your doctor with information about the size and shape of your heart and how well your heart's chambers and valves are working. This procedure takes approximately one hour. There are no restrictions for this procedure.  Follow-Up: Follow up in 1 year with Dr. Marlou Porch.  You will receive a letter in the mail 2 months before you are due.  Please call us when you receive this letter to schedule your follow up appointment.  If you need a refill on your cardiac medications before your next appointment, please call your pharmacy.  Thank you for choosing [facility]!!

## 2016-06-18 NOTE — Progress Notes (Addendum)
[facility]. [address], [address] [place], [place]  [address] Phone: [phone] Fax:  [phone]  Date:  06/18/2016   ID:  Heidi [name], DOB [birth year], MRN [id number]  PCP:  Wenda Low, MD   History of Present Illness: Heidi [name] is a 75 y.o. female with hyperlipidemia, paroxysmal atrial fibrillation (no recent episode), hypertension, obstructive sleep apnea status post pacemaker placement in 2007 secondary to tachycardia/bradycardia syndrome with history of depression/ECT treatment.  She underwent generator replacement in spring of 2013. She has a history of paroxysmal atrial fibrillation with a CHADS-VASc score of 3.   Cardiac catheterization was performed on 02/12/2006 which showed minor irregularities, mid RCA 40%, left main normal, LAD normal, ejection fraction 65%. She has infrequent ventricular rate or pacing less than 5%.   Per Dr. Olin Pia note as is stated above she has a history of paroxysmal atrial fibrillation but is not on current anticoagulation therapy.  She enjoys going to the beach every year with her red hat women  Main complaint however is lower extremity and pedal edema. Better in the mornings. She does not drink excessive water. She is on spironolactone.  Over the past month however she is felt more run down, fatigued, short of breath with activity. No fevers, no mucus production. Checking lab work.  Wt Readings from Last 3 Encounters:  06/18/16 153 lb 3.2 oz (69.5 kg)  10/18/15 156 lb (70.8 kg)  07/07/15 160 lb 3.2 oz (72.7 kg)     Past Medical History:  Diagnosis Date  . Anxiety   . Atrial fibrillation (HCC)    Intermittent  . CAD (coronary artery disease)    non obstructive  . CKD (chronic kidney disease)   . Diabetes mellitus without complication ([place])   . Dysrhythmia    PAF  . GERD (gastroesophageal reflux disease)   . Glaucoma    [place]  . HTN (hypertension)   . Hyperlipidemia   . Hypothyroid   . Major depression ([place])    10-17-2010: hospitalized for suicidal, [name] (D/C 10-30-2010)  . Migraine headache   . OSA (obstructive sleep apnea)    CPAP nightly  . Peripheral neuropathy ([place])   . Seasonal allergies   . Sleep apnea   . Tachy-brady syndrome ([place]) 04-2006   PPM placed    Past Surgical History:  Procedure Laterality Date  . ABDOMINAL ADHESION SURGERY  2008   exploratory to remove attached to the abdominal wall, stomach and intestines.   . ABDOMINAL HYSTERECTOMY  1976  . APPENDECTOMY    . BREAST SURGERY     fibroids removed, benign  . CARDIAC CATHETERIZATION  02-12-2006   Minor irregularities: RCA-mid 40%, LM-normal, LAD-normal. EF 65%.  . CHOLECYSTECTOMY    . ECTOPIC PREGNANCY SURGERY     treatment x3, last one 11-02-2009 at [facility]  . goiter    . goiter removed    . MASS EXCISION Right 10/18/2015   Procedure: EXCISION OF 6CM MASS ON RIGHT  NECK/BACK;  Surgeon: [name], MD;  Location: [facility] [facility];  Service: General;  Laterality: Right;  . PACEMAKER GENERATOR CHANGE N/A 01/15/2012   Procedure: PACEMAKER GENERATOR CHANGE;  Surgeon: Deboraha Sprang, MD;  Location[facility] C[facility] LAB;  Service: Cardiovascular;  Laterality: N/A;  . PACEMAKER INSERTION     Permanent. Medtronic EnRhyth 04/2006, set as DDDR  . TUMOR REMOVAL  2000   Fibroid from breast x4-most recent  . WRIST FRACTURE SURGERY  11-2007   right, had metal plate inserted    Current Outpatient Prescriptions  Medication Sig Dispense Refill  . aspirin EC 81 MG tablet Take 1 tablet (81 mg total) by mouth daily.    . cetirizine (ZYRTEC) 10 MG tablet Take 10 mg by mouth daily.      . Cholecalciferol (VITAMIN D) 2000 UNITS tablet Take 2,000 Units by mouth daily.      Marland Kitchen donepezil (ARICEPT) 10 MG tablet     . fenofibrate (TRICOR) 145 MG tablet Take 145 mg by mouth at bedtime.     . fluticasone (FLONASE) 50 MCG/ACT nasal spray Place 2 sprays into the nose daily as needed. For allergies    . furosemide  (LASIX) 20 MG tablet TAKE 1 TABLET BY MOUTH DAILY AS NEEDED FOR SWELLING 30 tablet 0  . gabapentin (NEURONTIN) 300 MG capsule Take 300 mg by mouth 3 (three) times daily.      Marland Kitchen HYDROcodone-acetaminophen (NORCO) 5-325 MG tablet Take 1 tablet by mouth every 4 (four) hours as needed for moderate pain. 30 tablet 0  . hydrOXYzine (VISTARIL) 25 MG capsule Take 25 mg by mouth at bedtime. 1 tabs daily    . levothyroxine (SYNTHROID, LEVOTHROID) 75 MCG tablet Take 75 mcg by mouth daily.      . meclizine (ANTIVERT) 25 MG tablet Take 25 mg by mouth 3 (three) times daily as needed for dizziness.    . metFORMIN (GLUCOPHAGE) 500 MG tablet Take by mouth 2 (two) times daily with a meal.    . nortriptyline (PAMELOR) 75 MG capsule Take 75 mg by mouth at bedtime. 2 tabs once for a total of 150 mg.    . omeprazole (PRILOSEC) 40 MG capsule Take 40 mg by mouth daily.    . polyethylene glycol (MIRALAX / GLYCOLAX) packet Take 17 g by mouth as needed.    . ranitidine (ZANTAC) 300 MG capsule Take 300 mg by mouth 2 (two) times daily.     Marland Kitchen spironolactone (ALDACTONE) 25 MG tablet Take 25 mg by mouth 2 (two) times daily.       No current facility-administered medications for this visit.     Allergies:    Allergies  Allergen Reactions  . Clopidogrel     Other reaction(s): RASH  . Crestor [Rosuvastatin Calcium]     Caused Myalgias  . Effexor [Venlafaxine Hydrochloride] Other (See Comments)    Caused PB to go up  . Hibiclens [Chlorhexidine Gluconate] Hives  . Lipitor [Atorvastatin Calcium]     Causes Rash  . Nardil     Caused BP Problems  . Penicillins     Swelling  . Plavix [Clopidogrel Bisulfate]     Causes Rash  . Rosuvastatin     Muscle aches  . Simvastatin     Causes Rash  . Sulfonamide Derivatives     Rash  . Venlafaxine     BP problems  . Zestril [Lisinopril]     Causes Rash  . Adhesive [Tape] Rash  . Atorvastatin Rash    Social History:  The patient  reports that she has quit smoking. She has  never used smokeless tobacco. She reports that she does not drink alcohol or use drugs.   Family History  Problem Relation Age of Onset  . Stomach cancer Father   . Atrial fibrillation Brother     ROS:  Please see the history of present illness.   Denies any syncope, bleeding, orthopnea, PND. Positive for vertigo.  All other systems  reviewed and negative.   PHYSICAL EXAM: VS:  BP 126/84   Pulse 96   Ht 5\' 2"  (1.575 m)   Wt 153 lb 3.2 oz (69.5 kg)   BMI 28.02 kg/m  Well nourished, well developed, in no acute distress  HEENT: normal, [place]/AT, EOMI Neck: no JVD, normal carotid upstroke, no bruit Cardiac:  normal S1, S2; RRR; no murmur  Lungs:  clear to auscultation bilaterally, no wheezing, rhonchi or rales  Abd: soft, nontender, no hepatomegaly, no bruits  Ext: Pedal edema 2+ edema, 2+ distal pulses Skin: warm and dry  GU: deferred Neuro: no focal abnormalities noted, AAO x 3  EKG: Today 06/15/15-sinus rhythm, left axis deviation, 80, personally viewed. Pacemaker interrogated previously.  ASSESSMENT AND PLAN:  1. Paroxysmal atrial fibrillation- CHADS-VASc score is 3. I encouraged her to take anticoagulation. Her daughter is a Marine scientist. She will think about it.  She understands risks of stroke. Continue to monitor for any events on pacemaker. 2. Fatigue-checking basic metabolic profile, CBC, TSH, free T4. She was to enjoy a trip to [place] but she worries that she will not be able to keep up with the group. Went to make sure that there is nothing significant on lab work. I will also check an echocardiogram to ensure proper structure and function of her heart. Dyspnea is noted. Pulse does seem slightly elevated. I want to make sure that her EF is intact. 3. Pacemaker-functioning well. Dr. Caryl Comes.  4. Edema of, lower extremities-continue with spironolactone. Avoid excessive fluid intake. Exercise, walking, weight loss can help with this. Previously offered her a short-term course of Lasix but  she did not wish to use this medication. This is fine. Last creatinine 1.2 in our system.  5. Hypertension-well-controlled medications reviewed.  6. Vertigo-per neurology. 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  7. One year follow up.   Signed, Candee Furbish, MD [facility]  06/18/2016 3:23 PM    Hg 11.2 (MCV 72) Iron def anemia. May be bleeding source. ? Cause for fatigue. Will forward to Dr. Lysle Rubens for further evaluation.  Candee Furbish, MD  TSH, BMET all normal

## 2016-06-19 LAB — BASIC METABOLIC PANEL
BUN: 18 mg/dL (ref 7–25)
CHLORIDE: 104 mmol/L (ref 98–110)
CO2: 25 mmol/L (ref 20–31)
Calcium: 10.3 mg/dL (ref 8.6–10.4)
Creat: 0.93 mg/dL (ref 0.60–0.93)
Glucose, Bld: 94 mg/dL (ref 65–99)
Potassium: 3.9 mmol/L (ref 3.5–5.3)
SODIUM: 141 mmol/L (ref 135–146)

## 2016-06-19 LAB — CUP PACEART INCLINIC DEVICE CHECK
Battery Impedance: 253 Ohm
Battery Remaining Longevity: 138 mo
Brady Statistic AP VS Percent: 0.3 %
Brady Statistic AS VS Percent: 99.7 %
Implantable Lead Implant Date: 20070710
Implantable Lead Location: 753859
Implantable Lead Location: 753860
Implantable Lead Serial Number: 483166
Implantable Lead Serial Number: 541525
Lead Channel Impedance Value: 658 Ohm
Lead Channel Pacing Threshold Pulse Width: 0.4 ms
Lead Channel Pacing Threshold Pulse Width: 0.4 ms
Lead Channel Sensing Intrinsic Amplitude: 11.2 mV
Lead Channel Setting Pacing Amplitude: 2 V
Lead Channel Setting Pacing Pulse Width: 0.4 ms
Lead Channel Setting Sensing Sensitivity: 5.6 mV
MDC IDC LEAD IMPLANT DT: 20070710
MDC IDC MSMT BATTERY VOLTAGE: 2.8 V
MDC IDC MSMT LEADCHNL RA IMPEDANCE VALUE: 546 Ohm
MDC IDC MSMT LEADCHNL RA PACING THRESHOLD AMPLITUDE: 0.625 V
MDC IDC MSMT LEADCHNL RA SENSING INTR AMPL: 2.8 mV
MDC IDC MSMT LEADCHNL RV PACING THRESHOLD AMPLITUDE: 0.625 V
MDC IDC SESS DTM: 20170913132251
MDC IDC SET LEADCHNL RV PACING AMPLITUDE: 2.5 V
MDC IDC STAT BRADY AP VP PERCENT: 0.1 % — AB
MDC IDC STAT BRADY AS VP PERCENT: 0.1 % — AB

## 2016-06-19 LAB — CBC
HCT: 35.1 % (ref 35.0–45.0)
HEMOGLOBIN: 11.2 g/dL — AB (ref 11.7–15.5)
MCH: 23.3 pg — ABNORMAL LOW (ref 27.0–33.0)
MCHC: 31.9 g/dL — ABNORMAL LOW (ref 32.0–36.0)
MCV: 73.1 fL — AB (ref 80.0–100.0)
MPV: 10.6 fL (ref 7.5–12.5)
PLATELETS: 273 10*3/uL (ref 140–400)
RBC: 4.8 MIL/uL (ref 3.80–5.10)
RDW: 15.8 % — ABNORMAL HIGH (ref 11.0–15.0)
WBC: 7.2 10*3/uL (ref 3.8–10.8)

## 2016-06-19 LAB — TSH: TSH: 2.57 mIU/L

## 2016-06-19 LAB — T4, FREE: Free T4: 1.4 ng/dL (ref 0.8–1.8)

## 2016-06-19 NOTE — Progress Notes (Signed)
Pacemaker check in clinic. Normal device function. Thresholds, sensing, impedances consistent with previous measurements. Device programmed to maximize longevity. No atrial high rate episodes. (5) high ventricular rates noted--max dur.8sec, Max Avg V 150--SVT. Device programmed at appropriate safety margins. Histogram distribution appropriate for patient activity level. Device programmed to optimize intrinsic conduction. Estimated longevity 11.5 years. Patient will follow up with SK as scheduled.

## 2016-06-21 ENCOUNTER — Other Ambulatory Visit: Payer: Self-pay

## 2016-06-21 ENCOUNTER — Ambulatory Visit (HOSPITAL_COMMUNITY): Payer: Medicare Other | Attending: Cardiology

## 2016-06-21 DIAGNOSIS — I495 Sick sinus syndrome: Secondary | ICD-10-CM | POA: Diagnosis not present

## 2016-06-21 DIAGNOSIS — I10 Essential (primary) hypertension: Secondary | ICD-10-CM | POA: Insufficient documentation

## 2016-06-21 DIAGNOSIS — I480 Paroxysmal atrial fibrillation: Secondary | ICD-10-CM | POA: Diagnosis not present

## 2016-06-21 DIAGNOSIS — R0600 Dyspnea, unspecified: Secondary | ICD-10-CM | POA: Insufficient documentation

## 2016-06-21 DIAGNOSIS — R5383 Other fatigue: Secondary | ICD-10-CM | POA: Insufficient documentation

## 2016-06-21 DIAGNOSIS — Z950 Presence of cardiac pacemaker: Secondary | ICD-10-CM | POA: Insufficient documentation

## 2016-06-21 DIAGNOSIS — Z87891 Personal history of nicotine dependence: Secondary | ICD-10-CM | POA: Insufficient documentation

## 2016-06-27 ENCOUNTER — Telehealth: Payer: Self-pay | Admitting: Cardiology

## 2016-06-27 NOTE — Telephone Encounter (Signed)
PT AWARE OF ECHO RESULTS./CY 

## 2016-06-27 NOTE — Telephone Encounter (Signed)
New Message  Pt call stating she was returning RN call.please call back to discuss

## 2016-07-08 ENCOUNTER — Encounter: Payer: Medicare Other | Admitting: Internal Medicine

## 2016-07-11 DIAGNOSIS — R42 Dizziness and giddiness: Secondary | ICD-10-CM | POA: Diagnosis not present

## 2016-07-11 DIAGNOSIS — G450 Vertebro-basilar artery syndrome: Secondary | ICD-10-CM | POA: Diagnosis not present

## 2016-07-11 DIAGNOSIS — R55 Syncope and collapse: Secondary | ICD-10-CM | POA: Diagnosis not present

## 2016-07-16 DIAGNOSIS — G450 Vertebro-basilar artery syndrome: Secondary | ICD-10-CM | POA: Diagnosis not present

## 2016-07-16 DIAGNOSIS — R42 Dizziness and giddiness: Secondary | ICD-10-CM | POA: Diagnosis not present

## 2016-07-29 ENCOUNTER — Other Ambulatory Visit: Payer: Self-pay | Admitting: Internal Medicine

## 2016-07-29 DIAGNOSIS — R42 Dizziness and giddiness: Secondary | ICD-10-CM | POA: Diagnosis not present

## 2016-07-29 DIAGNOSIS — G450 Vertebro-basilar artery syndrome: Secondary | ICD-10-CM | POA: Diagnosis not present

## 2016-08-06 DIAGNOSIS — G450 Vertebro-basilar artery syndrome: Secondary | ICD-10-CM | POA: Diagnosis not present

## 2016-08-06 DIAGNOSIS — R42 Dizziness and giddiness: Secondary | ICD-10-CM | POA: Diagnosis not present

## 2016-08-07 DIAGNOSIS — J069 Acute upper respiratory infection, unspecified: Secondary | ICD-10-CM | POA: Diagnosis not present

## 2016-08-12 DIAGNOSIS — G450 Vertebro-basilar artery syndrome: Secondary | ICD-10-CM | POA: Diagnosis not present

## 2016-08-12 DIAGNOSIS — R42 Dizziness and giddiness: Secondary | ICD-10-CM | POA: Diagnosis not present

## 2016-08-23 ENCOUNTER — Encounter: Payer: Medicare Other | Admitting: Internal Medicine

## 2016-08-23 ENCOUNTER — Ambulatory Visit (INDEPENDENT_AMBULATORY_CARE_PROVIDER_SITE_OTHER): Payer: Medicare Other | Admitting: Internal Medicine

## 2016-08-23 VITALS — BP 162/92 | HR 110 | Ht 62.0 in | Wt 150.8 lb

## 2016-08-23 DIAGNOSIS — Z950 Presence of cardiac pacemaker: Secondary | ICD-10-CM

## 2016-08-23 DIAGNOSIS — I480 Paroxysmal atrial fibrillation: Secondary | ICD-10-CM | POA: Diagnosis not present

## 2016-08-23 DIAGNOSIS — I495 Sick sinus syndrome: Secondary | ICD-10-CM | POA: Diagnosis not present

## 2016-08-23 LAB — CUP PACEART INCLINIC DEVICE CHECK
Battery Remaining Longevity: 125 mo
Brady Statistic AP VP Percent: 0 %
Brady Statistic AP VS Percent: 0 %
Brady Statistic AS VS Percent: 100 %
Implantable Lead Implant Date: 20070710
Implantable Lead Location: 753859
Implantable Lead Serial Number: 541525
Implantable Pulse Generator Implant Date: 20130410
Lead Channel Impedance Value: 546 Ohm
Lead Channel Impedance Value: 658 Ohm
Lead Channel Pacing Threshold Amplitude: 0.5 V
Lead Channel Pacing Threshold Amplitude: 0.625 V
Lead Channel Pacing Threshold Amplitude: 0.75 V
Lead Channel Pacing Threshold Pulse Width: 0.4 ms
Lead Channel Pacing Threshold Pulse Width: 0.4 ms
Lead Channel Setting Pacing Amplitude: 2.5 V
MDC IDC LEAD IMPLANT DT: 20070710
MDC IDC LEAD LOCATION: 753860
MDC IDC LEAD SERIAL: 483166
MDC IDC MSMT BATTERY IMPEDANCE: 253 Ohm
MDC IDC MSMT BATTERY VOLTAGE: 2.79 V
MDC IDC MSMT LEADCHNL RA PACING THRESHOLD AMPLITUDE: 0.5 V
MDC IDC MSMT LEADCHNL RA PACING THRESHOLD PULSEWIDTH: 0.4 ms
MDC IDC MSMT LEADCHNL RA SENSING INTR AMPL: 4 mV
MDC IDC MSMT LEADCHNL RV PACING THRESHOLD PULSEWIDTH: 0.4 ms
MDC IDC MSMT LEADCHNL RV SENSING INTR AMPL: 15.67 mV
MDC IDC SESS DTM: 20171117171300
MDC IDC SET LEADCHNL RA PACING AMPLITUDE: 2 V
MDC IDC SET LEADCHNL RV PACING PULSEWIDTH: 0.4 ms
MDC IDC SET LEADCHNL RV SENSING SENSITIVITY: 5.6 mV
MDC IDC STAT BRADY AS VP PERCENT: 0 %

## 2016-08-23 NOTE — Progress Notes (Signed)
Patient Care Team: Wenda Low, MD as PCP - General (Internal Medicine)   HPI  Heidi [name] is a 75 y.o. 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   She has had paroxysmal atrial fibrillation with a CHADS-VASc score of 3  She does not take anticoag   She is doing exceptionally well. She had a great time at the beach with her "red hat ladies".  She just recently returned from [place].  She has complaints of progressive peripheral edema. Her medications include spironolactone twice daily.   9/17 Cr 0.93 K3.9    Cardiac catheterization was performed on 02/12/2006 which showed minor irregularities, mid RCA 40%, left main normal, LAD normal, ejection fraction 65%. She has infrequent ventricular rate or pacing less than 5%.   Past Medical History:  Diagnosis Date  . Anxiety   . Atrial fibrillation (HCC)    Intermittent  . CAD (coronary artery disease)    non obstructive  . CKD (chronic kidney disease)   . Diabetes mellitus without complication ([place])   . Dysrhythmia    PAF  . GERD (gastroesophageal reflux disease)   . Glaucoma    [place]  . HTN (hypertension)   . Hyperlipidemia   . Hypothyroid   . Major depression    10-17-2010: hospitalized for suicidal, [name] (D/C 10-30-2010)  . Migraine headache   . OSA (obstructive sleep apnea)    CPAP nightly  . Peripheral neuropathy ([place])   . Seasonal allergies   . Sleep apnea   . Tachy-brady syndrome ([place]) 04-2006   PPM placed    Past Surgical History:  Procedure Laterality Date  . ABDOMINAL ADHESION SURGERY  2008   exploratory to remove attached to the abdominal wall, stomach and intestines.   . ABDOMINAL HYSTERECTOMY  1976  . APPENDECTOMY    . BREAST SURGERY     fibroids removed, benign  . CARDIAC CATHETERIZATION  02-12-2006   Minor irregularities: RCA-mid 40%, LM-normal, LAD-normal.  EF 65%.  . CHOLECYSTECTOMY    . ECTOPIC PREGNANCY SURGERY     treatment x3, last one 11-02-2009 at [facility]  . goiter    . goiter removed    . MASS EXCISION Right 10/18/2015   Procedure: EXCISION OF 6CM MASS ON RIGHT  NECK/BACK;  Surgeon: [name], MD;  Location: [place];  Service: General;  Laterality: Right;  . PACEMAKER GENERATOR CHANGE N/A 01/15/2012   Procedure: PACEMAKER GENERATOR CHANGE;  Surgeon: Deboraha Sprang, MD;  Location: [facility] CATH LAB;  Service: Cardiovascular;  Laterality: N/A;  . PACEMAKER INSERTION     Permanent. Medtronic EnRhyth 04/2006, set as DDDR  . TUMOR REMOVAL  2000   Fibroid from breast x4-most recent  . WRIST FRACTURE SURGERY  11-2007   right, had metal plate inserted    Current Outpatient Prescriptions  Medication Sig Dispense Refill  . aspirin EC 81 MG tablet Take 1 tablet (81 mg total) by mouth daily.    . cetirizine (ZYRTEC) 10 MG tablet Take 10 mg by mouth daily.      . Cholecalciferol (VITAMIN D) 2000 UNITS tablet Take 2,000 Units by mouth daily.      Marland Kitchen donepezil (ARICEPT) 10 MG tablet     . fenofibrate (TRICOR) 145 MG tablet Take 145 mg by mouth at bedtime.     . fluticasone (FLONASE) 50 MCG/ACT nasal spray Place 2  sprays into the nose daily as needed. For allergies    . furosemide (LASIX) 20 MG tablet TAKE 1 TABLET BY MOUTH DAILY AS NEEDED FOR SWELLING 30 tablet 6  . gabapentin (NEURONTIN) 300 MG capsule Take 300 mg by mouth 3 (three) times daily.      Marland Kitchen HYDROcodone-acetaminophen (NORCO) 5-325 MG tablet Take 1 tablet by mouth every 4 (four) hours as needed for moderate pain. 30 tablet 0  . hydrOXYzine (VISTARIL) 25 MG capsule Take 25 mg by mouth at bedtime. 1 tabs daily    . levothyroxine (SYNTHROID, LEVOTHROID) 75 MCG tablet Take 75 mcg by mouth daily.      . meclizine (ANTIVERT) 25 MG tablet Take 25 mg by mouth 3 (three) times daily as needed for dizziness.    . metFORMIN (GLUCOPHAGE) 500 MG tablet Take by mouth 2 (two)  times daily with a meal.    . nortriptyline (PAMELOR) 75 MG capsule Take 75 mg by mouth at bedtime. 2 tabs once for a total of 150 mg.    . omeprazole (PRILOSEC) 40 MG capsule Take 40 mg by mouth daily.    . polyethylene glycol (MIRALAX / GLYCOLAX) packet Take 17 g by mouth as needed.    . ranitidine (ZANTAC) 300 MG capsule Take 300 mg by mouth 2 (two) times daily.     Marland Kitchen spironolactone (ALDACTONE) 25 MG tablet Take 25 mg by mouth 2 (two) times daily.       No current facility-administered medications for this visit.     Allergies  Allergen Reactions  . Clopidogrel     Other reaction(s): RASH  . Crestor [Rosuvastatin Calcium]     Caused Myalgias  . Effexor [Venlafaxine Hydrochloride] Other (See Comments)    Caused PB to go up  . Hibiclens [Chlorhexidine Gluconate] Hives  . Lipitor [Atorvastatin Calcium]     Causes Rash  . Nardil     Caused BP Problems  . Penicillins     Swelling  . Plavix [Clopidogrel Bisulfate]     Causes Rash  . Rosuvastatin     Muscle aches  . Simvastatin     Causes Rash  . Sulfonamide Derivatives     Rash  . Venlafaxine     BP problems  . Zestril [Lisinopril]     Causes Rash  . Adhesive [Tape] Rash  . Atorvastatin Rash    Review of Systems negative except from HPI and PMH  Physical Exam BP (!) 162/92   Pulse (!) 110   Ht 5\' 2"  (1.575 m)   Wt 150 lb 12.8 oz (68.4 kg)   SpO2 93%   BMI 27.58 kg/m  Well developed and well nourished in no acute distress HENT normal E scleral and icterus clear Neck Supple JVP flat; carotids brisk and full Clear to ausculation Device pocket well healed; without hematoma or erythema.  There is no tethering  regular rate and rhythm, no murmurs gallops or rub Soft with active bowel sounds No clubbing cyanosis 2+ Edema Alert and oriented, grossly normal motor and sensory function Skin Warm and Dry  ECG dated 916 demonstrated demonstrates sinus rhythm 99 Intervals 18/09/35 Axis left -50   Assessment and   Plan  Sinus tachycardia  Sinus bradycardia/tachycardia bradycardia syndrome  Atrial fibrillation-paroxysmal  Hypertension  Anemia  Blood pressure is elevated today as is her edema. This may be related to the fact that she just came back from [place]. We will not make any adjustments at this time. We discussed resuming beta  blockers given her sinus rates and the mean being just over 100 bpm. She has noted no change in exercise tolerance since we stopped her Lopressor or year ago (at that point her mean heart rate was less than 80.  In this regard her TSH was normal 9/17. Notably however, her hemoglobin had gone 13.7--11.2 and her MCV had gone from 85.9--73. She will need an evaluation for iron deficiency it may be that her tachycardia is secondary.  Virl Axe

## 2016-08-23 NOTE — Patient Instructions (Signed)
Medication Instructions: Your physician recommends that you continue on your current medications as directed. Please refer to the Current Medication list given to you today.   Labwork: None Ordered  Procedures/Testing: None Ordered  Follow-Up: Your physician wants you to follow-up in 8 MONTHS with Dr. Marlou Porch. You will receive a reminder letter in the mail two months in advance. If you don't receive a letter, please call our office to schedule the follow-up appointment.  Your physician wants you to follow-up in 54 MONTHS with Dr. Caryl Comes. You will receive a reminder letter in the mail two months in advance. If you don't receive a letter, please call our office to schedule the follow-up appointment.   Any Additional Special Instructions Will Be Listed Below (If Applicable).     If you need a refill on your cardiac medications before your next appointment, please call your pharmacy.

## 2016-08-26 DIAGNOSIS — Z7984 Long term (current) use of oral hypoglycemic drugs: Secondary | ICD-10-CM | POA: Diagnosis not present

## 2016-08-26 DIAGNOSIS — F329 Major depressive disorder, single episode, unspecified: Secondary | ICD-10-CM | POA: Diagnosis not present

## 2016-08-26 DIAGNOSIS — I480 Paroxysmal atrial fibrillation: Secondary | ICD-10-CM | POA: Diagnosis not present

## 2016-08-26 DIAGNOSIS — E1122 Type 2 diabetes mellitus with diabetic chronic kidney disease: Secondary | ICD-10-CM | POA: Diagnosis not present

## 2016-08-26 DIAGNOSIS — N183 Chronic kidney disease, stage 3 (moderate): Secondary | ICD-10-CM | POA: Diagnosis not present

## 2016-08-26 DIAGNOSIS — D649 Anemia, unspecified: Secondary | ICD-10-CM | POA: Diagnosis not present

## 2016-08-26 DIAGNOSIS — G629 Polyneuropathy, unspecified: Secondary | ICD-10-CM | POA: Diagnosis not present

## 2016-08-26 DIAGNOSIS — E039 Hypothyroidism, unspecified: Secondary | ICD-10-CM | POA: Diagnosis not present

## 2016-08-26 DIAGNOSIS — I10 Essential (primary) hypertension: Secondary | ICD-10-CM | POA: Diagnosis not present

## 2016-08-26 DIAGNOSIS — Z79899 Other long term (current) drug therapy: Secondary | ICD-10-CM | POA: Diagnosis not present

## 2016-08-26 DIAGNOSIS — R413 Other amnesia: Secondary | ICD-10-CM | POA: Diagnosis not present

## 2016-08-28 DIAGNOSIS — H401412 Capsular glaucoma with pseudoexfoliation of lens, right eye, moderate stage: Secondary | ICD-10-CM | POA: Diagnosis not present

## 2016-08-28 DIAGNOSIS — H401422 Capsular glaucoma with pseudoexfoliation of lens, left eye, moderate stage: Secondary | ICD-10-CM | POA: Diagnosis not present

## 2016-09-04 DIAGNOSIS — Z1211 Encounter for screening for malignant neoplasm of colon: Secondary | ICD-10-CM | POA: Diagnosis not present

## 2016-09-25 DIAGNOSIS — R42 Dizziness and giddiness: Secondary | ICD-10-CM | POA: Diagnosis not present

## 2016-09-25 DIAGNOSIS — G450 Vertebro-basilar artery syndrome: Secondary | ICD-10-CM | POA: Diagnosis not present

## 2016-10-02 DIAGNOSIS — R42 Dizziness and giddiness: Secondary | ICD-10-CM | POA: Diagnosis not present

## 2016-10-02 DIAGNOSIS — G450 Vertebro-basilar artery syndrome: Secondary | ICD-10-CM | POA: Diagnosis not present

## 2016-10-09 DIAGNOSIS — R42 Dizziness and giddiness: Secondary | ICD-10-CM | POA: Diagnosis not present

## 2016-10-09 DIAGNOSIS — G450 Vertebro-basilar artery syndrome: Secondary | ICD-10-CM | POA: Diagnosis not present

## 2016-10-16 DIAGNOSIS — R42 Dizziness and giddiness: Secondary | ICD-10-CM | POA: Diagnosis not present

## 2016-10-16 DIAGNOSIS — G450 Vertebro-basilar artery syndrome: Secondary | ICD-10-CM | POA: Diagnosis not present

## 2016-10-30 DIAGNOSIS — R42 Dizziness and giddiness: Secondary | ICD-10-CM | POA: Diagnosis not present

## 2016-10-30 DIAGNOSIS — G450 Vertebro-basilar artery syndrome: Secondary | ICD-10-CM | POA: Diagnosis not present

## 2016-11-06 DIAGNOSIS — R42 Dizziness and giddiness: Secondary | ICD-10-CM | POA: Diagnosis not present

## 2016-11-06 DIAGNOSIS — G450 Vertebro-basilar artery syndrome: Secondary | ICD-10-CM | POA: Diagnosis not present

## 2016-11-25 ENCOUNTER — Ambulatory Visit (INDEPENDENT_AMBULATORY_CARE_PROVIDER_SITE_OTHER): Payer: Medicare Other | Admitting: *Deleted

## 2016-11-25 DIAGNOSIS — H401432 Capsular glaucoma with pseudoexfoliation of lens, bilateral, moderate stage: Secondary | ICD-10-CM | POA: Diagnosis not present

## 2016-11-25 DIAGNOSIS — I495 Sick sinus syndrome: Secondary | ICD-10-CM

## 2016-11-25 NOTE — Progress Notes (Signed)
Remote pacemaker transmission.   

## 2016-11-26 ENCOUNTER — Encounter: Payer: Self-pay | Admitting: Cardiology

## 2016-11-26 DIAGNOSIS — R42 Dizziness and giddiness: Secondary | ICD-10-CM | POA: Diagnosis not present

## 2016-11-26 DIAGNOSIS — G450 Vertebro-basilar artery syndrome: Secondary | ICD-10-CM | POA: Diagnosis not present

## 2016-11-26 LAB — CUP PACEART REMOTE DEVICE CHECK
Battery Impedance: 277 Ohm
Battery Voltage: 2.8 V
Brady Statistic AP VS Percent: 0 %
Brady Statistic AS VP Percent: 0 %
Implantable Lead Implant Date: 20070710
Implantable Lead Location: 753860
Implantable Lead Model: 4469
Implantable Lead Model: 4470
Implantable Lead Serial Number: 541525
Lead Channel Impedance Value: 718 Ohm
Lead Channel Pacing Threshold Amplitude: 0.5 V
Lead Channel Pacing Threshold Pulse Width: 0.4 ms
Lead Channel Setting Pacing Amplitude: 2.5 V
Lead Channel Setting Pacing Pulse Width: 0.4 ms
MDC IDC LEAD IMPLANT DT: 20070710
MDC IDC LEAD LOCATION: 753859
MDC IDC LEAD SERIAL: 483166
MDC IDC MSMT BATTERY REMAINING LONGEVITY: 122 mo
MDC IDC MSMT LEADCHNL RA IMPEDANCE VALUE: 572 Ohm
MDC IDC MSMT LEADCHNL RV PACING THRESHOLD AMPLITUDE: 0.625 V
MDC IDC MSMT LEADCHNL RV PACING THRESHOLD PULSEWIDTH: 0.4 ms
MDC IDC PG IMPLANT DT: 20130410
MDC IDC SESS DTM: 20180219131555
MDC IDC SET LEADCHNL RA PACING AMPLITUDE: 2 V
MDC IDC SET LEADCHNL RV SENSING SENSITIVITY: 5.6 mV
MDC IDC STAT BRADY AP VP PERCENT: 0 %
MDC IDC STAT BRADY AS VS PERCENT: 99 %

## 2016-11-27 DIAGNOSIS — E039 Hypothyroidism, unspecified: Secondary | ICD-10-CM | POA: Diagnosis not present

## 2016-11-27 DIAGNOSIS — R413 Other amnesia: Secondary | ICD-10-CM | POA: Diagnosis not present

## 2016-11-27 DIAGNOSIS — G450 Vertebro-basilar artery syndrome: Secondary | ICD-10-CM | POA: Diagnosis not present

## 2016-11-27 DIAGNOSIS — E1122 Type 2 diabetes mellitus with diabetic chronic kidney disease: Secondary | ICD-10-CM | POA: Diagnosis not present

## 2016-11-27 DIAGNOSIS — F322 Major depressive disorder, single episode, severe without psychotic features: Secondary | ICD-10-CM | POA: Diagnosis not present

## 2016-11-27 DIAGNOSIS — G4733 Obstructive sleep apnea (adult) (pediatric): Secondary | ICD-10-CM | POA: Diagnosis not present

## 2016-11-27 DIAGNOSIS — N183 Chronic kidney disease, stage 3 (moderate): Secondary | ICD-10-CM | POA: Diagnosis not present

## 2016-11-27 DIAGNOSIS — I10 Essential (primary) hypertension: Secondary | ICD-10-CM | POA: Diagnosis not present

## 2016-11-27 DIAGNOSIS — I2510 Atherosclerotic heart disease of native coronary artery without angina pectoris: Secondary | ICD-10-CM | POA: Diagnosis not present

## 2016-11-27 DIAGNOSIS — I480 Paroxysmal atrial fibrillation: Secondary | ICD-10-CM | POA: Diagnosis not present

## 2016-11-27 DIAGNOSIS — Z7984 Long term (current) use of oral hypoglycemic drugs: Secondary | ICD-10-CM | POA: Diagnosis not present

## 2016-12-09 DIAGNOSIS — R42 Dizziness and giddiness: Secondary | ICD-10-CM | POA: Diagnosis not present

## 2016-12-09 DIAGNOSIS — G450 Vertebro-basilar artery syndrome: Secondary | ICD-10-CM | POA: Diagnosis not present

## 2016-12-09 DIAGNOSIS — G4733 Obstructive sleep apnea (adult) (pediatric): Secondary | ICD-10-CM | POA: Diagnosis not present

## 2016-12-09 DIAGNOSIS — R197 Diarrhea, unspecified: Secondary | ICD-10-CM | POA: Diagnosis not present

## 2016-12-10 DIAGNOSIS — H2512 Age-related nuclear cataract, left eye: Secondary | ICD-10-CM | POA: Diagnosis not present

## 2016-12-18 DIAGNOSIS — E119 Type 2 diabetes mellitus without complications: Secondary | ICD-10-CM | POA: Diagnosis not present

## 2016-12-19 DIAGNOSIS — G4733 Obstructive sleep apnea (adult) (pediatric): Secondary | ICD-10-CM | POA: Diagnosis not present

## 2017-02-24 ENCOUNTER — Ambulatory Visit (INDEPENDENT_AMBULATORY_CARE_PROVIDER_SITE_OTHER): Payer: Medicare Other | Admitting: *Deleted

## 2017-02-24 DIAGNOSIS — I495 Sick sinus syndrome: Secondary | ICD-10-CM | POA: Diagnosis not present

## 2017-02-25 NOTE — Progress Notes (Signed)
Remote pacemaker transmission.   

## 2017-02-26 ENCOUNTER — Encounter: Payer: Self-pay | Admitting: Cardiology

## 2017-02-27 LAB — CUP PACEART REMOTE DEVICE CHECK
Battery Voltage: 2.8 V
Brady Statistic AP VS Percent: 2 %
Brady Statistic AS VS Percent: 98 %
Date Time Interrogation Session: 20180521130955
Implantable Lead Implant Date: 20070710
Implantable Lead Location: 753860
Implantable Lead Serial Number: 483166
Implantable Lead Serial Number: 541525
Lead Channel Pacing Threshold Pulse Width: 0.4 ms
Lead Channel Pacing Threshold Pulse Width: 0.4 ms
Lead Channel Setting Pacing Pulse Width: 0.4 ms
Lead Channel Setting Sensing Sensitivity: 5.6 mV
MDC IDC LEAD IMPLANT DT: 20070710
MDC IDC LEAD LOCATION: 753859
MDC IDC MSMT BATTERY IMPEDANCE: 301 Ohm
MDC IDC MSMT BATTERY REMAINING LONGEVITY: 129 mo
MDC IDC MSMT LEADCHNL RA IMPEDANCE VALUE: 564 Ohm
MDC IDC MSMT LEADCHNL RA PACING THRESHOLD AMPLITUDE: 0.75 V
MDC IDC MSMT LEADCHNL RV IMPEDANCE VALUE: 746 Ohm
MDC IDC MSMT LEADCHNL RV PACING THRESHOLD AMPLITUDE: 0.75 V
MDC IDC PG IMPLANT DT: 20130410
MDC IDC SET LEADCHNL RA PACING AMPLITUDE: 2 V
MDC IDC SET LEADCHNL RV PACING AMPLITUDE: 2.5 V
MDC IDC STAT BRADY AP VP PERCENT: 0 %
MDC IDC STAT BRADY AS VP PERCENT: 0 %

## 2017-03-07 ENCOUNTER — Other Ambulatory Visit: Payer: Self-pay | Admitting: Internal Medicine

## 2017-03-07 DIAGNOSIS — M8580 Other specified disorders of bone density and structure, unspecified site: Secondary | ICD-10-CM | POA: Diagnosis not present

## 2017-03-07 DIAGNOSIS — E1122 Type 2 diabetes mellitus with diabetic chronic kidney disease: Secondary | ICD-10-CM | POA: Diagnosis not present

## 2017-03-07 DIAGNOSIS — Z0000 Encounter for general adult medical examination without abnormal findings: Secondary | ICD-10-CM | POA: Diagnosis not present

## 2017-03-07 DIAGNOSIS — G4733 Obstructive sleep apnea (adult) (pediatric): Secondary | ICD-10-CM | POA: Diagnosis not present

## 2017-03-07 DIAGNOSIS — Z1389 Encounter for screening for other disorder: Secondary | ICD-10-CM | POA: Diagnosis not present

## 2017-03-07 DIAGNOSIS — G629 Polyneuropathy, unspecified: Secondary | ICD-10-CM | POA: Diagnosis not present

## 2017-03-07 DIAGNOSIS — E039 Hypothyroidism, unspecified: Secondary | ICD-10-CM | POA: Diagnosis not present

## 2017-03-07 DIAGNOSIS — Z7984 Long term (current) use of oral hypoglycemic drugs: Secondary | ICD-10-CM | POA: Diagnosis not present

## 2017-03-07 DIAGNOSIS — F329 Major depressive disorder, single episode, unspecified: Secondary | ICD-10-CM | POA: Diagnosis not present

## 2017-03-07 DIAGNOSIS — N183 Chronic kidney disease, stage 3 (moderate): Secondary | ICD-10-CM | POA: Diagnosis not present

## 2017-03-07 DIAGNOSIS — R413 Other amnesia: Secondary | ICD-10-CM | POA: Diagnosis not present

## 2017-03-07 DIAGNOSIS — I2510 Atherosclerotic heart disease of native coronary artery without angina pectoris: Secondary | ICD-10-CM | POA: Diagnosis not present

## 2017-03-07 DIAGNOSIS — I480 Paroxysmal atrial fibrillation: Secondary | ICD-10-CM | POA: Diagnosis not present

## 2017-03-07 DIAGNOSIS — R0989 Other specified symptoms and signs involving the circulatory and respiratory systems: Secondary | ICD-10-CM

## 2017-03-07 DIAGNOSIS — G450 Vertebro-basilar artery syndrome: Secondary | ICD-10-CM | POA: Diagnosis not present

## 2017-03-07 DIAGNOSIS — Z1211 Encounter for screening for malignant neoplasm of colon: Secondary | ICD-10-CM | POA: Diagnosis not present

## 2017-03-07 DIAGNOSIS — I10 Essential (primary) hypertension: Secondary | ICD-10-CM | POA: Diagnosis not present

## 2017-03-10 DIAGNOSIS — K219 Gastro-esophageal reflux disease without esophagitis: Secondary | ICD-10-CM | POA: Diagnosis not present

## 2017-03-10 DIAGNOSIS — R197 Diarrhea, unspecified: Secondary | ICD-10-CM | POA: Diagnosis not present

## 2017-03-11 ENCOUNTER — Ambulatory Visit
Admission: RE | Admit: 2017-03-11 | Discharge: 2017-03-11 | Disposition: A | Discharge status: Home / Self Care | Payer: Medicare Other | Source: Ambulatory Visit | Attending: Internal Medicine | Admitting: Internal Medicine

## 2017-03-11 DIAGNOSIS — R0989 Other specified symptoms and signs involving the circulatory and respiratory systems: Secondary | ICD-10-CM

## 2017-03-11 DIAGNOSIS — M7989 Other specified soft tissue disorders: Secondary | ICD-10-CM | POA: Diagnosis not present

## 2017-03-12 DIAGNOSIS — G4733 Obstructive sleep apnea (adult) (pediatric): Secondary | ICD-10-CM | POA: Diagnosis not present

## 2017-03-17 DIAGNOSIS — K219 Gastro-esophageal reflux disease without esophagitis: Secondary | ICD-10-CM | POA: Diagnosis not present

## 2017-03-17 DIAGNOSIS — K635 Polyp of colon: Secondary | ICD-10-CM | POA: Diagnosis not present

## 2017-03-17 DIAGNOSIS — K449 Diaphragmatic hernia without obstruction or gangrene: Secondary | ICD-10-CM | POA: Diagnosis not present

## 2017-03-17 DIAGNOSIS — K5730 Diverticulosis of large intestine without perforation or abscess without bleeding: Secondary | ICD-10-CM | POA: Diagnosis not present

## 2017-03-17 DIAGNOSIS — D126 Benign neoplasm of colon, unspecified: Secondary | ICD-10-CM | POA: Diagnosis not present

## 2017-03-17 DIAGNOSIS — Z86010 Personal history of colonic polyps: Secondary | ICD-10-CM | POA: Diagnosis not present

## 2017-03-17 DIAGNOSIS — R197 Diarrhea, unspecified: Secondary | ICD-10-CM | POA: Diagnosis not present

## 2017-03-18 ENCOUNTER — Telehealth: Payer: Self-pay | Admitting: Internal Medicine

## 2017-03-18 NOTE — Telephone Encounter (Signed)
New message      Pt had a endoscopy and colonoscopy yesterday.   She was told to call the office and have her pacemaker interrogated

## 2017-03-18 NOTE — Telephone Encounter (Signed)
I instructed Heidi [name] to send a transmission with her home monitor. I would review and call her back if there were any abnormalities. She is agreeable and appreciative. Transmission received. Normal device function, presenting rhythm SR.

## 2017-03-20 DIAGNOSIS — K635 Polyp of colon: Secondary | ICD-10-CM | POA: Diagnosis not present

## 2017-03-20 DIAGNOSIS — D126 Benign neoplasm of colon, unspecified: Secondary | ICD-10-CM | POA: Diagnosis not present

## 2017-03-20 DIAGNOSIS — K219 Gastro-esophageal reflux disease without esophagitis: Secondary | ICD-10-CM | POA: Diagnosis not present

## 2017-03-21 DIAGNOSIS — R197 Diarrhea, unspecified: Secondary | ICD-10-CM | POA: Diagnosis not present

## 2017-04-30 DIAGNOSIS — H401412 Capsular glaucoma with pseudoexfoliation of lens, right eye, moderate stage: Secondary | ICD-10-CM | POA: Diagnosis not present

## 2017-04-30 DIAGNOSIS — H401422 Capsular glaucoma with pseudoexfoliation of lens, left eye, moderate stage: Secondary | ICD-10-CM | POA: Diagnosis not present

## 2017-05-26 ENCOUNTER — Ambulatory Visit (INDEPENDENT_AMBULATORY_CARE_PROVIDER_SITE_OTHER): Payer: Medicare Other | Admitting: *Deleted

## 2017-05-26 DIAGNOSIS — I495 Sick sinus syndrome: Secondary | ICD-10-CM | POA: Diagnosis not present

## 2017-05-27 NOTE — Progress Notes (Signed)
Remote pacemaker transmission.   

## 2017-05-28 LAB — CUP PACEART REMOTE DEVICE CHECK
Brady Statistic AP VP Percent: 0 %
Brady Statistic AP VS Percent: 2 %
Brady Statistic AS VS Percent: 98 %
Implantable Lead Implant Date: 20070710
Implantable Lead Location: 753859
Implantable Lead Model: 4470
Implantable Lead Serial Number: 483166
Lead Channel Impedance Value: 581 Ohm
Lead Channel Impedance Value: 763 Ohm
Lead Channel Pacing Threshold Amplitude: 0.625 V
Lead Channel Pacing Threshold Amplitude: 0.75 V
Lead Channel Setting Pacing Amplitude: 2 V
Lead Channel Setting Pacing Amplitude: 2.5 V
Lead Channel Setting Sensing Sensitivity: 5.6 mV
MDC IDC LEAD IMPLANT DT: 20070710
MDC IDC LEAD LOCATION: 753860
MDC IDC LEAD SERIAL: 541525
MDC IDC MSMT BATTERY IMPEDANCE: 349 Ohm
MDC IDC MSMT BATTERY REMAINING LONGEVITY: 114 mo
MDC IDC MSMT BATTERY VOLTAGE: 2.8 V
MDC IDC MSMT LEADCHNL RA PACING THRESHOLD PULSEWIDTH: 0.4 ms
MDC IDC MSMT LEADCHNL RV PACING THRESHOLD PULSEWIDTH: 0.4 ms
MDC IDC PG IMPLANT DT: 20130410
MDC IDC SESS DTM: 20180820165749
MDC IDC SET LEADCHNL RV PACING PULSEWIDTH: 0.4 ms
MDC IDC STAT BRADY AS VP PERCENT: 0 %

## 2017-05-29 ENCOUNTER — Emergency Department (HOSPITAL_BASED_OUTPATIENT_CLINIC_OR_DEPARTMENT_OTHER): Payer: Medicare Other

## 2017-05-29 ENCOUNTER — Encounter (HOSPITAL_BASED_OUTPATIENT_CLINIC_OR_DEPARTMENT_OTHER): Payer: Self-pay | Admitting: *Deleted

## 2017-05-29 ENCOUNTER — Observation Stay (HOSPITAL_BASED_OUTPATIENT_CLINIC_OR_DEPARTMENT_OTHER)
Admission: EM | Admit: 2017-05-29 | Discharge: 2017-05-31 | Disposition: A | Discharge status: Home / Self Care | Payer: Medicare Other | Attending: Internal Medicine | Admitting: Internal Medicine

## 2017-05-29 DIAGNOSIS — R51 Headache: Secondary | ICD-10-CM | POA: Insufficient documentation

## 2017-05-29 DIAGNOSIS — N3000 Acute cystitis without hematuria: Secondary | ICD-10-CM | POA: Diagnosis not present

## 2017-05-29 DIAGNOSIS — I2510 Atherosclerotic heart disease of native coronary artery without angina pectoris: Secondary | ICD-10-CM | POA: Diagnosis present

## 2017-05-29 DIAGNOSIS — I130 Hypertensive heart and chronic kidney disease with heart failure and stage 1 through stage 4 chronic kidney disease, or unspecified chronic kidney disease: Secondary | ICD-10-CM | POA: Diagnosis not present

## 2017-05-29 DIAGNOSIS — E1142 Type 2 diabetes mellitus with diabetic polyneuropathy: Secondary | ICD-10-CM | POA: Insufficient documentation

## 2017-05-29 DIAGNOSIS — I480 Paroxysmal atrial fibrillation: Secondary | ICD-10-CM | POA: Diagnosis not present

## 2017-05-29 DIAGNOSIS — N183 Chronic kidney disease, stage 3 unspecified: Secondary | ICD-10-CM | POA: Diagnosis present

## 2017-05-29 DIAGNOSIS — F419 Anxiety disorder, unspecified: Secondary | ICD-10-CM | POA: Diagnosis not present

## 2017-05-29 DIAGNOSIS — N390 Urinary tract infection, site not specified: Secondary | ICD-10-CM | POA: Diagnosis not present

## 2017-05-29 DIAGNOSIS — I6782 Cerebral ischemia: Secondary | ICD-10-CM | POA: Insufficient documentation

## 2017-05-29 DIAGNOSIS — E039 Hypothyroidism, unspecified: Secondary | ICD-10-CM | POA: Diagnosis present

## 2017-05-29 DIAGNOSIS — Z79899 Other long term (current) drug therapy: Secondary | ICD-10-CM | POA: Diagnosis not present

## 2017-05-29 DIAGNOSIS — H4020X0 Unspecified primary angle-closure glaucoma, stage unspecified: Secondary | ICD-10-CM | POA: Diagnosis not present

## 2017-05-29 DIAGNOSIS — I10 Essential (primary) hypertension: Secondary | ICD-10-CM | POA: Diagnosis present

## 2017-05-29 DIAGNOSIS — I4891 Unspecified atrial fibrillation: Secondary | ICD-10-CM | POA: Diagnosis present

## 2017-05-29 DIAGNOSIS — Z882 Allergy status to sulfonamides status: Secondary | ICD-10-CM | POA: Insufficient documentation

## 2017-05-29 DIAGNOSIS — F03918 Unspecified dementia, unspecified severity, with other behavioral disturbance: Secondary | ICD-10-CM | POA: Diagnosis present

## 2017-05-29 DIAGNOSIS — Z880 Allergy status to penicillin: Secondary | ICD-10-CM | POA: Insufficient documentation

## 2017-05-29 DIAGNOSIS — G4733 Obstructive sleep apnea (adult) (pediatric): Secondary | ICD-10-CM | POA: Insufficient documentation

## 2017-05-29 DIAGNOSIS — E1122 Type 2 diabetes mellitus with diabetic chronic kidney disease: Secondary | ICD-10-CM | POA: Diagnosis not present

## 2017-05-29 DIAGNOSIS — F0390 Unspecified dementia without behavioral disturbance: Secondary | ICD-10-CM | POA: Diagnosis not present

## 2017-05-29 DIAGNOSIS — Z888 Allergy status to other drugs, medicaments and biological substances status: Secondary | ICD-10-CM | POA: Insufficient documentation

## 2017-05-29 DIAGNOSIS — E1129 Type 2 diabetes mellitus with other diabetic kidney complication: Secondary | ICD-10-CM | POA: Diagnosis present

## 2017-05-29 DIAGNOSIS — F329 Major depressive disorder, single episode, unspecified: Secondary | ICD-10-CM | POA: Insufficient documentation

## 2017-05-29 DIAGNOSIS — G9341 Metabolic encephalopathy: Secondary | ICD-10-CM | POA: Diagnosis present

## 2017-05-29 DIAGNOSIS — K219 Gastro-esophageal reflux disease without esophagitis: Secondary | ICD-10-CM | POA: Diagnosis present

## 2017-05-29 DIAGNOSIS — Z7984 Long term (current) use of oral hypoglycemic drugs: Secondary | ICD-10-CM | POA: Insufficient documentation

## 2017-05-29 DIAGNOSIS — E785 Hyperlipidemia, unspecified: Secondary | ICD-10-CM | POA: Diagnosis present

## 2017-05-29 DIAGNOSIS — I440 Atrioventricular block, first degree: Secondary | ICD-10-CM | POA: Insufficient documentation

## 2017-05-29 DIAGNOSIS — I5032 Chronic diastolic (congestive) heart failure: Secondary | ICD-10-CM | POA: Diagnosis not present

## 2017-05-29 DIAGNOSIS — I495 Sick sinus syndrome: Secondary | ICD-10-CM | POA: Diagnosis present

## 2017-05-29 DIAGNOSIS — R410 Disorientation, unspecified: Secondary | ICD-10-CM | POA: Diagnosis not present

## 2017-05-29 DIAGNOSIS — Z7982 Long term (current) use of aspirin: Secondary | ICD-10-CM | POA: Diagnosis not present

## 2017-05-29 DIAGNOSIS — Z87891 Personal history of nicotine dependence: Secondary | ICD-10-CM | POA: Insufficient documentation

## 2017-05-29 DIAGNOSIS — F32A Depression, unspecified: Secondary | ICD-10-CM | POA: Diagnosis present

## 2017-05-29 DIAGNOSIS — Z950 Presence of cardiac pacemaker: Secondary | ICD-10-CM | POA: Diagnosis present

## 2017-05-29 LAB — CBC WITH DIFFERENTIAL/PLATELET
BASOS PCT: 1 %
Basophils Absolute: 0.1 10*3/uL (ref 0.0–0.1)
EOS ABS: 0.3 10*3/uL (ref 0.0–0.7)
Eosinophils Relative: 4 %
HEMATOCRIT: 43.5 % (ref 36.0–46.0)
HEMOGLOBIN: 14.5 g/dL (ref 12.0–15.0)
LYMPHS ABS: 3.1 10*3/uL (ref 0.7–4.0)
Lymphocytes Relative: 36 %
MCH: 28.8 pg (ref 26.0–34.0)
MCHC: 33.3 g/dL (ref 30.0–36.0)
MCV: 86.3 fL (ref 78.0–100.0)
Monocytes Absolute: 0.5 10*3/uL (ref 0.1–1.0)
Monocytes Relative: 6 %
NEUTROS ABS: 4.7 10*3/uL (ref 1.7–7.7)
NEUTROS PCT: 55 %
Platelets: 241 10*3/uL (ref 150–400)
RBC: 5.04 MIL/uL (ref 3.87–5.11)
RDW: 13.4 % (ref 11.5–15.5)
WBC: 8.6 10*3/uL (ref 4.0–10.5)

## 2017-05-29 NOTE — ED Triage Notes (Signed)
Hypertension. At noon today she took her BP and it was elevated. She called her MD and was told to be seen in the AM if it was still elevated or come to the ED. She got nauseated tonight and feels tingling in the top of her head. She is alert oriented.

## 2017-05-30 ENCOUNTER — Encounter (HOSPITAL_BASED_OUTPATIENT_CLINIC_OR_DEPARTMENT_OTHER): Payer: Self-pay | Admitting: Emergency Medicine

## 2017-05-30 DIAGNOSIS — E1129 Type 2 diabetes mellitus with other diabetic kidney complication: Secondary | ICD-10-CM | POA: Diagnosis present

## 2017-05-30 DIAGNOSIS — F0390 Unspecified dementia without behavioral disturbance: Secondary | ICD-10-CM | POA: Diagnosis present

## 2017-05-30 DIAGNOSIS — F03918 Unspecified dementia, unspecified severity, with other behavioral disturbance: Secondary | ICD-10-CM | POA: Diagnosis present

## 2017-05-30 DIAGNOSIS — G9341 Metabolic encephalopathy: Secondary | ICD-10-CM | POA: Diagnosis present

## 2017-05-30 DIAGNOSIS — N390 Urinary tract infection, site not specified: Secondary | ICD-10-CM | POA: Diagnosis present

## 2017-05-30 DIAGNOSIS — N3000 Acute cystitis without hematuria: Secondary | ICD-10-CM | POA: Diagnosis not present

## 2017-05-30 DIAGNOSIS — I5032 Chronic diastolic (congestive) heart failure: Secondary | ICD-10-CM | POA: Diagnosis present

## 2017-05-30 DIAGNOSIS — E039 Hypothyroidism, unspecified: Secondary | ICD-10-CM | POA: Diagnosis present

## 2017-05-30 DIAGNOSIS — F32A Depression, unspecified: Secondary | ICD-10-CM | POA: Diagnosis present

## 2017-05-30 DIAGNOSIS — N183 Chronic kidney disease, stage 3 unspecified: Secondary | ICD-10-CM | POA: Diagnosis present

## 2017-05-30 DIAGNOSIS — K219 Gastro-esophageal reflux disease without esophagitis: Secondary | ICD-10-CM | POA: Diagnosis present

## 2017-05-30 DIAGNOSIS — I2510 Atherosclerotic heart disease of native coronary artery without angina pectoris: Secondary | ICD-10-CM | POA: Diagnosis present

## 2017-05-30 DIAGNOSIS — F329 Major depressive disorder, single episode, unspecified: Secondary | ICD-10-CM | POA: Diagnosis present

## 2017-05-30 DIAGNOSIS — R51 Headache: Secondary | ICD-10-CM | POA: Diagnosis not present

## 2017-05-30 DIAGNOSIS — E785 Hyperlipidemia, unspecified: Secondary | ICD-10-CM | POA: Diagnosis present

## 2017-05-30 LAB — URINALYSIS, MICROSCOPIC (REFLEX)

## 2017-05-30 LAB — ACETAMINOPHEN LEVEL: Acetaminophen (Tylenol), Serum: <10 ug/mL — ABNORMAL LOW (ref 10–30)

## 2017-05-30 LAB — RAPID URINE DRUG SCREEN, HOSP PERFORMED
AMPHETAMINES: NOT DETECTED
BENZODIAZEPINES: NOT DETECTED
Barbiturates: NOT DETECTED
Cocaine: NOT DETECTED
Opiates: NOT DETECTED
TETRAHYDROCANNABINOL: NOT DETECTED

## 2017-05-30 LAB — COMPREHENSIVE METABOLIC PANEL
ALBUMIN: 4.6 g/dL (ref 3.5–5.0)
ALK PHOS: 76 U/L (ref 38–126)
ALT: 36 U/L (ref 14–54)
AST: 34 U/L (ref 15–41)
Anion gap: 11 (ref 5–15)
BILIRUBIN TOTAL: 0.5 mg/dL (ref 0.3–1.2)
BUN: 21 mg/dL — AB (ref 6–20)
CALCIUM: 10 mg/dL (ref 8.9–10.3)
CO2: 27 mmol/L (ref 22–32)
CREATININE: 1.3 mg/dL — AB (ref 0.44–1.00)
Chloride: 101 mmol/L (ref 101–111)
GFR calc Af Amer: 45 mL/min — ABNORMAL LOW (ref >60)
GFR calc non Af Amer: 39 mL/min — ABNORMAL LOW (ref >60)
GLUCOSE: 141 mg/dL — AB (ref 65–99)
Potassium: 3.7 mmol/L (ref 3.5–5.1)
SODIUM: 139 mmol/L (ref 135–145)
TOTAL PROTEIN: 7.8 g/dL (ref 6.5–8.1)

## 2017-05-30 LAB — URINALYSIS, ROUTINE W REFLEX MICROSCOPIC
BILIRUBIN URINE: NEGATIVE
GLUCOSE, UA: NEGATIVE mg/dL
HGB URINE DIPSTICK: NEGATIVE
KETONES UR: NEGATIVE mg/dL
Nitrite: NEGATIVE
PH: 7.5 (ref 5.0–8.0)
PROTEIN: NEGATIVE mg/dL
Specific Gravity, Urine: 1.022 (ref 1.005–1.030)

## 2017-05-30 LAB — SALICYLATE LEVEL

## 2017-05-30 LAB — CBG MONITORING, ED
GLUCOSE-CAPILLARY: 105 mg/dL — AB (ref 65–99)
GLUCOSE-CAPILLARY: 146 mg/dL — AB (ref 65–99)
Glucose-Capillary: 124 mg/dL — ABNORMAL HIGH (ref 65–99)

## 2017-05-30 LAB — GLUCOSE, CAPILLARY: Glucose-Capillary: 109 mg/dL — ABNORMAL HIGH (ref 65–99)

## 2017-05-30 LAB — TROPONIN I: Troponin I: <0.03 ng/mL (ref <0.03)

## 2017-05-30 MED ORDER — GABAPENTIN 300 MG PO CAPS
300.0000 mg | ORAL_CAPSULE | Freq: Three times a day (TID) | ORAL | Status: DC
  Administered 2017-05-30 – 2017-05-31 (×3): 300 mg via ORAL
  Filled 2017-05-30 (×3): qty 1

## 2017-05-30 MED ORDER — MECLIZINE HCL 25 MG PO TABS: 25.0000 mg | ORAL_TABLET | Freq: Three times a day (TID) | ORAL | Status: DC | PRN

## 2017-05-30 MED ORDER — HYDROXYZINE HCL 25 MG PO TABS
25.0000 mg | ORAL_TABLET | Freq: Once | ORAL | Status: AC
  Administered 2017-05-31: 25 mg via ORAL
  Filled 2017-05-30: qty 1

## 2017-05-30 MED ORDER — ONDANSETRON HCL 4 MG/2ML IJ SOLN: 4.0000 mg | Freq: Three times a day (TID) | INTRAMUSCULAR | Status: DC | PRN

## 2017-05-30 MED ORDER — GLIMEPIRIDE 1 MG PO TABS
1.0000 mg | ORAL_TABLET | Freq: Every day | ORAL | Status: DC
  Filled 2017-05-30: qty 1

## 2017-05-30 MED ORDER — NORTRIPTYLINE HCL 25 MG PO CAPS
75.0000 mg | ORAL_CAPSULE | Freq: Every day | ORAL | Status: DC
Start: 2017-05-30 — End: 2017-05-30
  Administered 2017-05-30: 75 mg via ORAL
  Filled 2017-05-30: qty 3

## 2017-05-30 MED ORDER — METOPROLOL TARTRATE 50 MG PO TABS
25.0000 mg | ORAL_TABLET | Freq: Once | ORAL | Status: AC
  Administered 2017-05-30: 25 mg via ORAL
  Filled 2017-05-30: qty 1

## 2017-05-30 MED ORDER — METOPROLOL SUCCINATE ER 25 MG PO TB24
25.0000 mg | ORAL_TABLET | Freq: Every day | ORAL | Status: DC
  Administered 2017-05-31: 25 mg via ORAL
  Filled 2017-05-30: qty 1

## 2017-05-30 MED ORDER — CIPROFLOXACIN IN D5W 400 MG/200ML IV SOLN: 400.0000 mg | Freq: Two times a day (BID) | INTRAVENOUS | Status: DC

## 2017-05-30 MED ORDER — SODIUM CHLORIDE 0.9 % IV SOLN
INTRAVENOUS | Status: DC
  Administered 2017-05-30 (×3): via INTRAVENOUS

## 2017-05-30 MED ORDER — RISAQUAD PO CAPS: 2.0000 | ORAL_CAPSULE | Freq: Every day | ORAL | Status: DC

## 2017-05-30 MED ORDER — FERROUS SULFATE 325 (65 FE) MG PO TABS
325.0000 mg | ORAL_TABLET | Freq: Every day | ORAL | Status: DC
  Administered 2017-05-31: 325 mg via ORAL
  Filled 2017-05-30: qty 1

## 2017-05-30 MED ORDER — CIPROFLOXACIN IN D5W 400 MG/200ML IV SOLN
400.0000 mg | Freq: Two times a day (BID) | INTRAVENOUS | Status: DC
  Administered 2017-05-31: 400 mg via INTRAVENOUS
  Filled 2017-05-30: qty 200

## 2017-05-30 MED ORDER — CIPROFLOXACIN IN D5W 400 MG/200ML IV SOLN
400.0000 mg | Freq: Once | INTRAVENOUS | Status: AC
  Administered 2017-05-30: 400 mg via INTRAVENOUS
  Filled 2017-05-30: qty 200

## 2017-05-30 MED ORDER — NORTRIPTYLINE HCL 25 MG PO CAPS: 150.0000 mg | ORAL_CAPSULE | Freq: Every day | ORAL | Status: DC

## 2017-05-30 MED ORDER — PANTOPRAZOLE SODIUM 40 MG PO TBEC
40.0000 mg | DELAYED_RELEASE_TABLET | Freq: Every day | ORAL | Status: DC
  Administered 2017-05-30 – 2017-05-31 (×2): 40 mg via ORAL
  Filled 2017-05-30 (×2): qty 1

## 2017-05-30 MED ORDER — VITAMIN D3 25 MCG (1000 UNIT) PO TABS
2000.0000 [IU] | ORAL_TABLET | Freq: Every day | ORAL | Status: DC
  Administered 2017-05-31: 2000 [IU] via ORAL
  Filled 2017-05-30: qty 2

## 2017-05-30 MED ORDER — DONEPEZIL HCL 10 MG PO TABS
10.0000 mg | ORAL_TABLET | Freq: Every day | ORAL | Status: DC
  Administered 2017-05-30: 10 mg via ORAL
  Filled 2017-05-30: qty 1

## 2017-05-30 MED ORDER — ENOXAPARIN SODIUM 30 MG/0.3ML ~~LOC~~ SOLN
30.0000 mg | SUBCUTANEOUS | Status: DC
  Administered 2017-05-30: 30 mg via SUBCUTANEOUS
  Filled 2017-05-30: qty 0.3

## 2017-05-30 MED ORDER — INSULIN ASPART 100 UNIT/ML ~~LOC~~ SOLN
0.0000 [IU] | Freq: Three times a day (TID) | SUBCUTANEOUS | Status: DC
  Administered 2017-05-30 – 2017-05-31 (×3): 1 [IU] via SUBCUTANEOUS
  Filled 2017-05-30 (×2): qty 1

## 2017-05-30 MED ORDER — FLUTICASONE PROPIONATE 50 MCG/ACT NA SUSP
2.0000 | Freq: Every day | NASAL | Status: DC
  Administered 2017-05-31: 2 via NASAL
  Filled 2017-05-30: qty 16

## 2017-05-30 MED ORDER — RISAQUAD PO CAPS
2.0000 | ORAL_CAPSULE | Freq: Every day | ORAL | Status: DC
  Administered 2017-05-31: 2 via ORAL
  Filled 2017-05-30: qty 2

## 2017-05-30 MED ORDER — INSULIN ASPART 100 UNIT/ML ~~LOC~~ SOLN: 0.0000 [IU] | Freq: Every day | SUBCUTANEOUS | Status: DC

## 2017-05-30 MED ORDER — LEVOTHYROXINE SODIUM 75 MCG PO TABS
75.0000 ug | ORAL_TABLET | Freq: Every day | ORAL | Status: DC
  Administered 2017-05-31: 75 ug via ORAL
  Filled 2017-05-30 (×2): qty 1
  Filled 2017-05-30: qty 3

## 2017-05-30 MED ORDER — NORTRIPTYLINE HCL 25 MG PO CAPS
75.0000 mg | ORAL_CAPSULE | Freq: Once | ORAL | Status: AC
  Administered 2017-05-31: 75 mg via ORAL
  Filled 2017-05-30: qty 3

## 2017-05-30 MED ORDER — ASPIRIN EC 81 MG PO TBEC
81.0000 mg | DELAYED_RELEASE_TABLET | Freq: Every day | ORAL | Status: DC
  Administered 2017-05-30: 81 mg via ORAL
  Filled 2017-05-30: qty 1

## 2017-05-30 NOTE — H&P (Signed)
History and Physical    Heidi [name] QQV:[id number] DOB: 1941-01-23 DOA: 05/29/2017  PCP: Wenda Low, MD Patient coming from [facility] ED admitted from home  Chief Complaint: change in mental status  HPI: Heidi [name] is a 76 y.o. female admitted with change in mental status. This patient was transferred from [facility] emergency department. Looking through the records and hearing from the patient patient was admitted with hypertension and change in mental status. Patient has a history of high blood pressure. And she takes her medications as prescribed. She was diagnosed with a urinary tract infection and she is admitted for the same. Patient reports that she is not in any pain she has no urinary complaints hematuria dysuria frequency of urination. She has no complaints of chest pain shortness of breath nausea vomiting diarrhea headaches or changes with her vision.  ED Course: She was given ciprofloxacin in the ER and IV fluids..  Review of Systems: As per HPI otherwise 10 point review of systems negative.    Past Medical History:  Diagnosis Date  . Anxiety   . Atrial fibrillation (HCC)    Intermittent  . CAD (coronary artery disease)    non obstructive  . CKD (chronic kidney disease)   . Diabetes mellitus without complication ([place])   . Dysrhythmia    PAF  . GERD (gastroesophageal reflux disease)   . Glaucoma    [place]  . HTN (hypertension)   . Hyperlipidemia   . Hypothyroid   . Major depression    10-17-2010: hospitalized for suicidal, [name] (D/C 10-30-2010)  . Migraine headache   . OSA (obstructive sleep apnea)    CPAP nightly  . Peripheral neuropathy   . Seasonal allergies   . Sleep apnea   . 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 ABDOMINAL ADHESION SURGERY  2008   exploratory to remove attached to the abdominal wall, stomach and intestines.   . ABDOMINAL HYSTERECTOMY  1976   . APPENDECTOMY    . BREAST SURGERY     fibroids removed, benign  . CARDIAC CATHETERIZATION  02-12-2006   Minor irregularities: RCA-mid 40%, LM-normal, LAD-normal. EF 65%.  . CHOLECYSTECTOMY    . ECTOPIC PREGNANCY SURGERY     treatment x3, last one 11-02-2009 at [facility]  . goiter    . goiter removed    . MASS EXCISION Right 10/18/2015   Procedure: EXCISION OF 6CM MASS ON RIGHT  NECK/BACK;  Surgeon: [name], MD;  Location: [place];  Service: General;  Laterality: Right;  . PACEMAKER GENERATOR CHANGE N/A 01/15/2012   Procedure: PACEMAKER GENERATOR CHANGE;  Surgeon: Deboraha Sprang, MD;  Location: [facility] CATH LAB;  Service: Cardiovascular;  Laterality: N/A;  . PACEMAKER INSERTION     Permanent. [employer] EnRhyth 04/2006, set as DDDR  . TUMOR REMOVAL  2000   Fibroid from breast x4-most recent  . WRIST FRACTURE SURGERY  11-2007   right, had metal plate inserted     reports that she has quit smoking. She has never used smokeless tobacco. She reports that she does not drink alcohol or use drugs.  Allergies  Allergen Reactions  . Clopidogrel     Other reaction(s): RASH  . Crestor [Rosuvastatin Calcium]     Caused Myalgias  . Effexor [Venlafaxine Hydrochloride] Other (See Comments)    Caused PB to go up  . Hibiclens [Chlorhexidine Gluconate] Hives  .  Lipitor [Atorvastatin Calcium]     Causes Rash  . Nardil     Caused BP Problems  . Penicillins     Swelling  . Plavix [Clopidogrel Bisulfate]     Causes Rash  . Rosuvastatin     Muscle aches  . Simvastatin     Causes Rash  . Sulfonamide Derivatives     Rash  . Venlafaxine     BP problems  . Zestril [Lisinopril]     Causes Rash  . Adhesive [Tape] Rash  . Atorvastatin Rash    Family History  Problem Relation Age of Onset  . Stomach cancer Father   . Atrial fibrillation Brother    Unacceptable: Noncontributory, unremarkable, or negative. Acceptable: Family history reviewed and not pertinent (If you  reviewed it)  Prior to Admission medications   Medication Sig Start Date End Date Taking? Authorizing Provider  aspirin EC 81 MG tablet Take 81 mg by mouth every evening.  12/24/11  Yes Deboraha Sprang, MD  cetirizine (ZYRTEC) 10 MG tablet Take 10 mg by mouth daily after breakfast.    Yes [provider]  Cholecalciferol (VITAMIN D) 2000 UNITS tablet Take 2,000 Units by mouth daily.     Yes [provider]  donepezil (ARICEPT) 10 MG tablet Take 10 mg by mouth daily after breakfast.  04/22/15  Yes [provider]  fenofibrate (TRICOR) 145 MG tablet Take 145 mg by mouth at bedtime.    Yes [provider]  ferrous sulfate (FERROUSUL) 325 (65 FE) MG tablet Take 325 mg by mouth daily with breakfast.   Yes [provider]  fluticasone (FLONASE) 50 MCG/ACT nasal spray Place 2 sprays into the nose daily as needed for allergies.    Yes [provider]  furosemide (LASIX) 20 MG tablet TAKE 1 TABLET BY MOUTH DAILY AS NEEDED FOR SWELLING 07/30/16  Yes Jerline Pain, MD  gabapentin (NEURONTIN) 300 MG capsule Take 300 mg by mouth 3 (three) times daily.     Yes [provider]  glimepiride (AMARYL) 1 MG tablet Take 1 mg by mouth daily after breakfast.   Yes [provider]  hydrOXYzine (VISTARIL) 25 MG capsule Take 25 mg by mouth at bedtime.    Yes [provider]  levothyroxine (SYNTHROID, LEVOTHROID) 75 MCG tablet Take 75 mcg by mouth daily before breakfast.    Yes [provider]  metoprolol succinate (TOPROL-XL) 25 MG 24 hr tablet Take 25 mg by mouth daily. 05/19/17  Yes [provider]  nortriptyline (PAMELOR) 75 MG capsule Take 150 mg by mouth at bedtime.    Yes [provider]  omeprazole (PRILOSEC) 40 MG capsule Take 40 mg by mouth 2 (two) times daily.    Yes [provider]  ranitidine (ZANTAC) 300 MG capsule Take 300 mg by mouth 2 (two) times daily.    Yes [provider]    spironolactone (ALDACTONE) 25 MG tablet Take 25 mg by mouth 2 (two) times daily.     Yes [provider]    Physical Exam: Vitals:   05/30/17 1200 05/30/17 1300 05/30/17 1330 05/30/17 1510  BP: 116/70 120/65 128/72 139/70  Pulse: 82 85  88  Resp: 11 10 16 16   Temp: 99.1 F (37.3 C)   98.3 F (36.8 C)  TempSrc: Oral   Oral  SpO2: 99% 98%  99%  Weight:    66.9 kg (147 lb 7.8 oz)  Height:    5\' 2"  (1.575 m)  Constitutional: NAD, calm, comfortable Vitals:   05/30/17 1200 05/30/17 1300 05/30/17 1330 05/30/17 1510  BP: 116/70 120/65 128/72 139/70  Pulse: 82 85  88  Resp: 11 10 16 16   Temp: 99.1 F (37.3 C)   98.3 F (36.8 C)  TempSrc: Oral   Oral  SpO2: 99% 98%  99%  Weight:    66.9 kg (147 lb 7.8 oz)  Height:    5\' 2"  (1.575 m)   Eyes: PERRL, lids and conjunctivae normal ENMT: Mucous membranes are moist. Posterior pharynx clear of any exudate or lesions.Normal dentition.  Neck: normal, supple, no masses, no thyromegaly Respiratory: clear to auscultation bilaterally, no wheezing, no crackles. Normal respiratory effort. No accessory muscle use.  Cardiovascular: Regular rate and rhythm, no murmurs / rubs / gallops. No extremity edema. 2+ pedal pulses. No carotid bruits.  Abdomen: no tenderness, no masses palpated. No hepatosplenomegaly. Bowel sounds positive.  Musculoskeletal: no clubbing / cyanosis. No joint deformity upper and lower extremities. Good ROM, no contractures. Normal muscle tone.  Skin: no rashes, lesions, ulcers. No induration Neurologic: CN 2-12 grossly intact. Sensation intact, DTR normal. Strength 5/5 in all 4.  Psychiatric: Normal judgment and insight. Alert and oriented x 3. Normal mood.     Labs on Admission: I have personally reviewed following labs and imaging studies  CBC:  Recent Labs Lab 05/29/17 2332  WBC 8.6  NEUTROABS 4.7  HGB 14.5  HCT 43.5  MCV 86.3  PLT 638   Basic Metabolic Panel:  Recent Labs Lab 05/29/17 2332  NA  139  K 3.7  CL 101  CO2 27  GLUCOSE 141*  BUN 21*  CREATININE 1.30*  CALCIUM 10.0   GFR: Estimated Creatinine Clearance: 33 mL/min (A) (by C-G formula based on SCr of 1.3 mg/dL (H)). Liver Function Tests:  Recent Labs Lab 05/29/17 2332  AST 34  ALT 36  ALKPHOS 76  BILITOT 0.5  PROT 7.8  ALBUMIN 4.6   No results for input(s): LIPASE, AMYLASE in the last 168 hours. No results for input(s): AMMONIA in the last 168 hours. Coagulation Profile: No results for input(s): INR, PROTIME in the last 168 hours. Cardiac Enzymes:  Recent Labs Lab 05/29/17 2332  TROPONINI <0.03   BNP (last 3 results) No results for input(s): PROBNP in the last 8760 hours. HbA1C: No results for input(s): HGBA1C in the last 72 hours. CBG:  Recent Labs Lab 05/30/17 0639 05/30/17 0841 05/30/17 1243 05/30/17 1634  GLUCAP 105* 124* 146* 109*   Lipid Profile: No results for input(s): CHOL, HDL, LDLCALC, TRIG, CHOLHDL, LDLDIRECT in the last 72 hours. Thyroid Function Tests: No results for input(s): TSH, T4TOTAL, FREET4, T3FREE, THYROIDAB in the last 72 hours. Anemia Panel: No results for input(s): VITAMINB12, FOLATE, FERRITIN, TIBC, IRON, RETICCTPCT in the last 72 hours. Urine analysis:    Component Value Date/Time   COLORURINE YELLOW 05/29/2017 2341   APPEARANCEUR CLOUDY (A) 05/29/2017 2341   LABSPEC 1.022 05/29/2017 2341   PHURINE 7.5 05/29/2017 2341   GLUCOSEU NEGATIVE 05/29/2017 2341   HGBUR NEGATIVE 05/29/2017 2341   BILIRUBINUR NEGATIVE 05/29/2017 2341   KETONESUR NEGATIVE 05/29/2017 2341   PROTEINUR NEGATIVE 05/29/2017 2341   UROBILINOGEN 1.0 10/17/2010 1558   NITRITE NEGATIVE 05/29/2017 2341   LEUKOCYTESUR LARGE (A) 05/29/2017 2341    Radiological Exams on Admission: Dg Chest 2 View  Result Date: 05/30/2017 CLINICAL DATA:  Headache EXAM: CHEST  2 VIEW COMPARISON:  04/16/2012 FINDINGS: Lungs are clear.  No pleural effusion or pneumothorax.  The heart is normal in size.  Right  chest pacemaker. Mild degenerative changes of the visualized thoracolumbar spine. IMPRESSION: Normal chest radiographs. Electronically Signed   By: Julian Hy M.D.   On: 05/30/2017 00:26   Ct Head Wo Contrast  Result Date: 05/30/2017 CLINICAL DATA:  Dull headache for 12 hours.  No head injury. EXAM: CT HEAD WITHOUT CONTRAST TECHNIQUE: Contiguous axial images were obtained from the base of the skull through the vertex without intravenous contrast. COMPARISON:  10/24/2015 FINDINGS: Brain: A 2 mm punctate focus of increased density along posterior right frontal cortex towards the vertex (series 2, image 22) is attributed to image noise, without evidence of intracranial hemorrhage elsewhere. No acute infarct, mass, midline shift, or extra-axial fluid collection is identified. Cerebral atrophy is unchanged. Patchy cerebral white matter hypodensities are unchanged and nonspecific but compatible with mild chronic small vessel ischemic disease. Vascular: Calcified atherosclerosis at the skullbase. No hyperdense vessel. Skull: No fracture or focal osseous lesion. Sinuses/Orbits: Visualized paranasal sinuses and mastoid air cells are clear. Bilateral cataract extraction. Other: None. IMPRESSION: 1. No evidence of acute intracranial abnormality. 2. Mild chronic small vessel ischemic disease. Electronically Signed   By: Logan Bores M.D.   On: 05/30/2017 00:31    EKG: Independently reviewed.   Assessment/Plan Principal Problem:   UTI (urinary tract infection) Active Problems:   A-fib (HCC)   Tachycardia-bradycardia (HCC)   Pacemaker-mdt   Essential hypertension, benign   HLD (hyperlipidemia)   GERD (gastroesophageal reflux disease)   Acute metabolic encephalopathy   Type II diabetes mellitus with renal manifestations (HCC)   Hypothyroidism   Depression   CAD (coronary artery disease)   CKD (chronic kidney disease), stage III   Chronic diastolic CHF (congestive heart failure) (HCC)    Dementia change in mental status patient appears to be back to baseline. She is awake alert oriented 3. She has absolutely no complaints. She received IV fluids ciprofloxacin. Her UA showed large amount of leukocytes and negative nitrites. I will continue her ciprofloxacin. Follow-up urine culture. And if patient is stable and asymptomatic consider discharge in 1-2 days. Patient had a CT scan of her head while in the ER patient did not reveal anything acute.  Diabetes type 2 patient takes Amaryl 1 mg daily Which will be continued.    history of hypothyroidism continue Synthroid 75 MCG daily  Neuropathy continue Neurontin.  Hypertension patient takes Lasix 20 mg when necessary. She also takes spironolactone 25 mg twice a day and Toprol-XL 25 mg daily. Will continue Toprol XL and Aldactone.  CK D stage III creatinine 1.30 which is baseline..         DVT prophylaxis: lovenox Code Status: full Family Communication:  Disposition Plan:  Consults called:  Admission status: medsurg   Georgette Shell MD [facility]  If 7PM-7AM, please contact night-coverage [email or web address] Password [facility]  05/30/2017, 5:14 PM

## 2017-05-30 NOTE — ED Notes (Signed)
Pt ambulated to BR without difficulty

## 2017-05-30 NOTE — ED Provider Notes (Signed)
[facility] Provider Note   CSN: [id number] Arrival date & time: 05/29/17  2125     History   Chief Complaint Chief Complaint  Patient presents with  . Hypertension    HPI Heidi [name] is a 76 y.o. female.  The history is limited by the condition of the patient.  Hypertension  This is a chronic problem. The current episode started more than 1 week ago. The problem occurs constantly. Pertinent negatives include no chest pain, no abdominal pain, no headaches and no shortness of breath. Nothing aggravates the symptoms. Nothing relieves the symptoms. She has tried nothing for the symptoms. The treatment provided no relief.  Patient is confused it is unclear whether this is her baseline or new.  There is no one with her to help with this information.    Past Medical History:  Diagnosis Date  . Anxiety   . Atrial fibrillation (HCC)    Intermittent  . CAD (coronary artery disease)    non obstructive  . CKD (chronic kidney disease)   . Diabetes mellitus without complication ([place])   . Dysrhythmia    PAF  . GERD (gastroesophageal reflux disease)   . Glaucoma    [place]  . HTN (hypertension)   . Hyperlipidemia   . Hypothyroid   . Major depression    10-17-2010: hospitalized for suicidal, [name] (D/C 10-30-2010)  . Migraine headache   . OSA (obstructive sleep apnea)    CPAP nightly  . Peripheral neuropathy   . Seasonal allergies   . Sleep apnea   . 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 Essential hypertension, benign 06/15/2014  . Allergic reaction 04/28/2012  . Tachycardia-bradycardia ([place]) 12/24/2011  . 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 HTN (hypertension) 12/24/2011  . A-fib ([place]) 11/07/2011    Past Surgical History:  Procedure Laterality Date  . ABDOMINAL ADHESION SURGERY  2008   exploratory to remove attached to the abdominal wall, stomach and intestines.   .  ABDOMINAL HYSTERECTOMY  1976  . APPENDECTOMY    . BREAST SURGERY     fibroids removed, benign  . CARDIAC CATHETERIZATION  02-12-2006   Minor irregularities: RCA-mid 40%, LM-normal, LAD-normal. EF 65%.  . CHOLECYSTECTOMY    . ECTOPIC PREGNANCY SURGERY     treatment x3, last one 11-02-2009 at [facility]  . goiter    . goiter removed    . MASS EXCISION Right 10/18/2015   Procedure: EXCISION OF 6CM MASS ON RIGHT  NECK/BACK;  Surgeon: [name], MD;  Location: [facility];  Service: General;  Laterality: Right;  . PACEMAKER GENERATOR CHANGE N/A 01/15/2012   Procedure: PACEMAKER GENERATOR CHANGE;  Surgeon: Deboraha Sprang, MD;  Location: [facility] CATH LAB;  Service: Cardiovascular;  Laterality: N/A;  . PACEMAKER INSERTION     Permanent. [employer] EnRhyth 04/2006, set as DDDR  . TUMOR REMOVAL  2000   Fibroid from breast x4-most recent  . WRIST FRACTURE SURGERY  11-2007   right, had metal plate inserted    OB History    No data available       Home Medications    Prior to Admission medications   Medication Sig Start Date End Date Taking? Authorizing Provider  aspirin EC 81 MG tablet Take 1 tablet (81 mg total) by mouth daily. 12/24/11  Yes Deboraha Sprang, MD  cetirizine (ZYRTEC) 10 MG tablet Take 10 mg  by mouth daily.     Yes [provider]  Cholecalciferol (VITAMIN D) 2000 UNITS tablet Take 2,000 Units by mouth daily.     Yes [provider]  donepezil (ARICEPT) 10 MG tablet  04/22/15  Yes [provider]  fenofibrate (TRICOR) 145 MG tablet Take 145 mg by mouth at bedtime.    Yes [provider]  fluticasone (FLONASE) 50 MCG/ACT nasal spray Place 2 sprays into the nose daily as needed. For allergies   Yes [provider]  furosemide (LASIX) 20 MG tablet TAKE 1 TABLET BY MOUTH DAILY AS NEEDED FOR SWELLING 07/30/16  Yes Jerline Pain, MD  gabapentin (NEURONTIN) 300 MG capsule Take 300 mg by mouth 3 (three) times daily.     Yes  [provider]  GLYBURIDE PO Take by mouth.   Yes [provider]  hydrOXYzine (VISTARIL) 25 MG capsule Take 25 mg by mouth at bedtime. 1 tabs daily   Yes [provider]  levothyroxine (SYNTHROID, LEVOTHROID) 75 MCG tablet Take 75 mcg by mouth daily.     Yes [provider]  nortriptyline (PAMELOR) 75 MG capsule Take 75 mg by mouth at bedtime. 2 tabs once for a total of 150 mg.   Yes [provider]  omeprazole (PRILOSEC) 40 MG capsule Take 40 mg by mouth daily.   Yes [provider]  polyethylene glycol (MIRALAX / GLYCOLAX) packet Take 17 g by mouth as needed.   Yes [provider]  ranitidine (ZANTAC) 300 MG capsule Take 300 mg by mouth 2 (two) times daily.    Yes [provider]  spironolactone (ALDACTONE) 25 MG tablet Take 25 mg by mouth 2 (two) times daily.     Yes [provider]  HYDROcodone-acetaminophen (NORCO) 5-325 MG tablet Take 1 tablet by mouth every 4 (four) hours as needed for moderate pain. 10/18/15   [name], MD  meclizine (ANTIVERT) 25 MG tablet Take 25 mg by mouth 3 (three) times daily as needed for dizziness.    [provider]  metFORMIN (GLUCOPHAGE) 500 MG tablet Take by mouth 2 (two) times daily with a meal.    [provider]    Family History Family History  Problem Relation Age of Onset  . Stomach cancer Father   . Atrial fibrillation Brother     Social History Social History  Substance Use Topics  . Smoking status: Former Research scientist (life sciences)  . Smokeless tobacco: Never Used  . Alcohol use No     Allergies   Clopidogrel; Crestor [rosuvastatin calcium]; Effexor [venlafaxine hydrochloride]; Hibiclens [chlorhexidine gluconate]; Lipitor [atorvastatin calcium]; Nardil; Penicillins; Plavix [clopidogrel bisulfate]; Rosuvastatin; Simvastatin; Sulfonamide derivatives; Venlafaxine; Zestril [lisinopril]; Adhesive [tape]; and Atorvastatin   Review of Systems Review of  Systems  Unable to perform ROS: Mental status change  Constitutional: Negative for fever.  Respiratory: Negative for shortness of breath.   Cardiovascular: Negative for chest pain.  Gastrointestinal: Negative for abdominal pain.  Neurological: Negative for facial asymmetry and headaches.     Physical Exam Updated Vital Signs BP (!) 140/93   Pulse 88   Temp 98.5 F (36.9 C)   Resp 13   Ht 5\' 2"  (1.575 m)   Wt 66.2 kg (146 lb)   SpO2 98%   BMI 26.70 kg/m   Physical Exam  Constitutional: She appears well-developed and well-nourished. No distress.  HENT:  Head: Normocephalic and atraumatic.  Eyes: Pupils are equal, round, and reactive to light. EOM are normal.  Injected sclera  Neck: Normal range of motion. Neck supple. No JVD present.  Cardiovascular: Normal rate, regular rhythm, normal heart sounds and intact distal pulses.   Pulmonary/Chest: Effort normal and breath sounds normal. No stridor. No respiratory distress. She has no wheezes. She has no rales.  Abdominal: Soft. Bowel sounds are normal. She exhibits no mass. There is no tenderness. There is no rebound and no guarding.  Musculoskeletal: Normal range of motion. She exhibits no deformity.  Neurological: She is alert. She displays normal reflexes. No cranial nerve deficit. She exhibits normal muscle tone. Coordination normal.  Skin: Skin is dry. Capillary refill takes less than 2 seconds.  Warm and flushed  Psychiatric: She has a normal mood and affect.     ED Treatments / Results   Vitals:   05/30/17 0116 05/30/17 0228  BP:  107/90  Pulse:  81  Resp:  16  Temp: 98 F (36.7 C) 98 F (36.7 C)  SpO2:  98%    Labs (all labs ordered are listed, but only abnormal results are displayed)  Results for orders placed or performed during the hospital encounter of 05/29/17  CBC with Differential/Platelet  Result Value Ref Range   WBC 8.6 4.0 - 10.5 K/uL   RBC 5.04 3.87 - 5.11 MIL/uL   Hemoglobin 14.5 12.0 - 15.0  g/dL   HCT 43.5 36.0 - 46.0 %   MCV 86.3 78.0 - 100.0 fL   MCH 28.8 26.0 - 34.0 pg   MCHC 33.3 30.0 - 36.0 g/dL   RDW 13.4 11.5 - 15.5 %   Platelets 241 150 - 400 K/uL   Neutrophils Relative % 55 %   Neutro Abs 4.7 1.7 - 7.7 K/uL   Lymphocytes Relative 36 %   Lymphs Abs 3.1 0.7 - 4.0 K/uL   Monocytes Relative 6 %   Monocytes Absolute 0.5 0.1 - 1.0 K/uL   Eosinophils Relative 4 %   Eosinophils Absolute 0.3 0.0 - 0.7 K/uL   Basophils Relative 1 %   Basophils Absolute 0.1 0.0 - 0.1 K/uL  Comprehensive metabolic panel  Result Value Ref Range   Sodium 139 135 - 145 mmol/L   Potassium 3.7 3.5 - 5.1 mmol/L   Chloride 101 101 - 111 mmol/L   CO2 27 22 - 32 mmol/L   Glucose, Bld 141 (H) 65 - 99 mg/dL   BUN 21 (H) 6 - 20 mg/dL   Creatinine, Ser 1.30 (H) 0.44 - 1.00 mg/dL   Calcium 10.0 8.9 - 10.3 mg/dL   Total Protein 7.8 6.5 - 8.1 g/dL   Albumin 4.6 3.5 - 5.0 g/dL   AST 34 15 - 41 U/L   ALT 36 14 - 54 U/L   Alkaline Phosphatase 76 38 - 126 U/L   Total Bilirubin 0.5 0.3 - 1.2 mg/dL   GFR calc non Af Amer 39 (L) >60 mL/min   GFR calc Af Amer 45 (L) >60 mL/min   Anion gap 11 5 - 15  Urinalysis, Routine w reflex microscopic  Result Value Ref Range   Color, Urine YELLOW YELLOW   APPearance CLOUDY (A) CLEAR   Specific Gravity, Urine 1.022 1.005 - 1.030   pH 7.5 5.0 - 8.0   Glucose, UA NEGATIVE NEGATIVE mg/dL   Hgb urine dipstick NEGATIVE NEGATIVE   Bilirubin Urine NEGATIVE NEGATIVE   Ketones, ur NEGATIVE NEGATIVE mg/dL   Protein, ur NEGATIVE NEGATIVE mg/dL   Nitrite NEGATIVE NEGATIVE   Leukocytes, UA LARGE (A) NEGATIVE  Troponin I  Result Value  Ref Range   Troponin I <0.03 <0.03 ng/mL  Acetaminophen level  Result Value Ref Range   Acetaminophen (Tylenol), Serum <10 (L) 10 - 30 ug/mL  Salicylate level  Result Value Ref Range   Salicylate Lvl <0.6 2.8 - 30.0 mg/dL  Rapid urine drug screen (hospital performed)  Result Value Ref Range   Opiates NONE DETECTED NONE DETECTED    Cocaine NONE DETECTED NONE DETECTED   Benzodiazepines NONE DETECTED NONE DETECTED   Amphetamines NONE DETECTED NONE DETECTED   Tetrahydrocannabinol NONE DETECTED NONE DETECTED   Barbiturates NONE DETECTED NONE DETECTED  Urinalysis, Microscopic (reflex)  Result Value Ref Range   RBC / HPF 0-5 0 - 5 RBC/hpf   WBC, UA TOO NUMEROUS TO COUNT 0 - 5 WBC/hpf   Bacteria, UA MANY (A) NONE SEEN   Squamous Epithelial / LPF 0-5 (A) NONE SEEN   Dg Chest 2 View  Result Date: 05/30/2017 CLINICAL DATA:  Headache EXAM: CHEST  2 VIEW COMPARISON:  04/16/2012 FINDINGS: Lungs are clear.  No pleural effusion or pneumothorax. The heart is normal in size.  Right chest pacemaker. Mild degenerative changes of the visualized thoracolumbar spine. IMPRESSION: Normal chest radiographs. Electronically Signed   By: Julian Hy M.D.   On: 05/30/2017 00:26   Ct Head Wo Contrast  Result Date: 05/30/2017 CLINICAL DATA:  Dull headache for 12 hours.  No head injury. EXAM: CT HEAD WITHOUT CONTRAST TECHNIQUE: Contiguous axial images were obtained from the base of the skull through the vertex without intravenous contrast. COMPARISON:  10/24/2015 FINDINGS: Brain: A 2 mm punctate focus of increased density along posterior right frontal cortex towards the vertex (series 2, image 22) is attributed to image noise, without evidence of intracranial hemorrhage elsewhere. No acute infarct, mass, midline shift, or extra-axial fluid collection is identified. Cerebral atrophy is unchanged. Patchy cerebral white matter hypodensities are unchanged and nonspecific but compatible with mild chronic small vessel ischemic disease. Vascular: Calcified atherosclerosis at the skullbase. No hyperdense vessel. Skull: No fracture or focal osseous lesion. Sinuses/Orbits: Visualized paranasal sinuses and mastoid air cells are clear. Bilateral cataract extraction. Other: None. IMPRESSION: 1. No evidence of acute intracranial abnormality. 2. Mild chronic small  vessel ischemic disease. Electronically Signed   By: Logan Bores M.D.   On: 05/30/2017 00:31    EKG  EKG Interpretation  Date/Time:  Thursday May 29 2017 21:41:36 EDT Ventricular Rate:  91 PR Interval:  210 QRS Duration: 102 QT Interval:  356 QTC Calculation: 437 R Axis:   -44 Text Interpretation:  Sinus rhythm with 1st degree A-V block Left axis deviation Left ventricular hypertrophy Confirmed by Randal Buba, Cloma Rahrig ([id number]) on 05/29/2017 11:18:40 PM       Rad Procedures Procedures (including critical care time)  Medications Ordered in ED  Medications  0.9 %  sodium chloride infusion ( Intravenous New Bag/Given 05/30/17 0107)  ciprofloxacin (CIPRO) IVPB 400 mg (0 mg Intravenous Stopped 05/30/17 0228)     Final Clinical Impressions(s) / ED Diagnoses  UTI: suspect confusion is being caused by this. Will admit to medicine    Tomoko Sandra, MD 05/30/17 [phone]

## 2017-05-30 NOTE — ED Notes (Signed)
Pt states she would like something to eat now.  Pt given oatmeal and coffee per request.

## 2017-05-30 NOTE — ED Notes (Signed)
Pt upset that she is still here.  Spoke to dr. Ayesha Rumpf about re-assessing her condition.

## 2017-05-30 NOTE — Progress Notes (Addendum)
This is a no charge note  Transfer from [facility] per Dr. Randal Buba  76 year old lady with past medical history for hypertension, hyperlipidemia, diabetes mellitus, GERD, hypothyroidism, depression, anxiety, tachycardia/bradycardia syndrome, s/p of pacemaker placement, OSA, migraine headaches, CAD, atrial fibrillation not on anticoagulants, CKD-3, dCHF, dementia, who presents with altered mental status.  Patient was found to have positive urinalysis for UTI with large amount of leukocytes, WBC 8.6, troponin negative, UDS negative, Tylenol level less than 10, salicylate level less than 7, slightly worsening renal function, temperature normal, no tachycardia, O2 sat 98% on room air, negative chest x-ray, negative CT head for acute intracranial abnormalities. Patient placed on telemetry bed for observation. One dose of ciprofloxacin was given in ED.  Please call manager of [facility] at [phone] when pt arrives to floor  Ivor Costa, MD  [facility] Pager [phone]  If 7PM-7AM, please contact night-coverage [email or web address] Password [email or web address] 05/30/2017, 1:06 AM

## 2017-05-30 NOTE — Progress Notes (Signed)
Pt arrived to [facility], stable condition, pt oriented to room, IV patent, denies pain, TELE monitor placed, VSs stable. SRP, RN

## 2017-05-31 DIAGNOSIS — I5032 Chronic diastolic (congestive) heart failure: Secondary | ICD-10-CM | POA: Diagnosis not present

## 2017-05-31 DIAGNOSIS — N390 Urinary tract infection, site not specified: Secondary | ICD-10-CM | POA: Diagnosis not present

## 2017-05-31 DIAGNOSIS — I10 Essential (primary) hypertension: Secondary | ICD-10-CM | POA: Diagnosis not present

## 2017-05-31 DIAGNOSIS — G9341 Metabolic encephalopathy: Secondary | ICD-10-CM

## 2017-05-31 DIAGNOSIS — N3000 Acute cystitis without hematuria: Secondary | ICD-10-CM | POA: Diagnosis not present

## 2017-05-31 LAB — BASIC METABOLIC PANEL
ANION GAP: 7 (ref 5–15)
BUN: 18 mg/dL (ref 6–20)
CALCIUM: 9.8 mg/dL (ref 8.9–10.3)
CHLORIDE: 107 mmol/L (ref 101–111)
CO2: 26 mmol/L (ref 22–32)
Creatinine, Ser: 0.87 mg/dL (ref 0.44–1.00)
GFR calc non Af Amer: >60 mL/min (ref >60)
GLUCOSE: 146 mg/dL — AB (ref 65–99)
POTASSIUM: 3.8 mmol/L (ref 3.5–5.1)
Sodium: 140 mmol/L (ref 135–145)

## 2017-05-31 LAB — GLUCOSE, CAPILLARY: Glucose-Capillary: 126 mg/dL — ABNORMAL HIGH (ref 65–99)

## 2017-05-31 MED ORDER — CIPROFLOXACIN HCL 250 MG PO TABS: 250.0000 mg | ORAL_TABLET | Freq: Two times a day (BID) | ORAL | 0 refills | Status: DC

## 2017-05-31 NOTE — Discharge Summary (Signed)
Physician Discharge Summary  Heidi [name] ZOX:[id number] DOB: May 23, 1941 DOA: 05/29/2017  PCP: Wenda Low, MD  Admit date: 05/29/2017 Discharge date: 05/31/2017  Admitted From: home  Disposition:  home  Recommendations for Outpatient Follow-up:  1. Ciprofloxacin for 5 days 2. Follow up pending urine culture 3. PCP visit in 1-2 weeks for blood pressure check and for further advice regarding her lasix.    Home Health:  none  Equipment/Devices:  None  Discharge Condition:  Stable, improved CODE STATUS:  Full code  Diet recommendation:  Healthy heart   Brief/Interim Summary:  Heidi [name] is a 76 y.o. female admitted with change in mental status and elevated blood pressure to 200/120 range at home.  BP came down quickly spontaneously.  She was seen in the ER and diagnosed with UTI and admitted for observation to make sure her mentation improved.  Her blood pressure have generally been well controlled since admission.  Her mentation improved and she was able to tolerate breakfast.  A urine culture was obtained on 8/25 since none was sent with her initial UA.  This may not grow any bacteria since she already received antibiotics prior to obtaining the sample.  She was given an Rx for oral ciprofloxacin for 5 days.  Follow up with PCP in 1-2 weeks for blood pressure check and review of urine culture.    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   Patient appeared to be back to baseline at time of admission.  She was awake alert oriented 3.  CT head unremarkable.  -  Continue treatment for UTI as below  UTI -  Urine culture will likely be negative because obtained after initiation of antibiotics -  Ciprofloxacin x 5 days  Diabetes type 2, continued Amaryl 1 mg daily  Hypothyroidism, stable, continue Synthroid 75 MCG daily  Neuropathy, stable, continue Neurontin.  Hypertension, patient takes Lasix 20 mg when necessary but has been taking daily lately due to puffiness around her eyes.   -  Continue Toprol XL and Aldactone.   Acute on CKD stage III,  but creatinine trended down from 1.3 to 0.87 mg/dl with IVF and holding diuretics -  Recommended she cut back on her lasix some until she sees her PCP at next visit -  Resume aldactone in 1 day -  Continue metoprolol   Discharge Instructions  Discharge Instructions    ([place]) Call MD:  Anytime you have any of the following symptoms: 1) 3 pound weight gain in 24 hours or 5 pounds in 1 week 2) shortness of breath, with or without a dry hacking cough 3) swelling in the hands, feet or stomach 4) if you have to sleep on extra pillows at night in order to breathe.    Complete by:  As directed    Call MD for:  persistant nausea and vomiting    Complete by:  As directed    Call MD for:  temperature >100.4  Complete by:  As directed    Diet - low sodium heart healthy    Complete by:  As directed    Discharge instructions    Complete by:  As directed    Please take lasix ONLY if really needed.  Resume spironolactone tomorrow.  Next dose of ciprofloxacin is due this evening.   Increase activity slowly    Complete by:  As directed        Medication List    TAKE these medications   aspirin EC 81 MG tablet Take 81 mg by mouth every evening.   cetirizine 10 MG tablet Commonly known as:  ZYRTEC Take 10 mg by mouth daily after breakfast.   ciprofloxacin 250  MG tablet Commonly known as:  CIPRO Take 1 tablet (250 mg total) by mouth 2 (two) times daily.   donepezil 10 MG tablet Commonly known as:  ARICEPT Take 10 mg by mouth daily after breakfast.   fenofibrate 145 MG tablet Commonly known as:  TRICOR Take 145 mg by mouth at bedtime.   FERROUSUL 325 (65 FE) MG tablet Generic drug:  ferrous sulfate Take 325 mg by mouth daily with breakfast.   fluticasone 50 MCG/ACT nasal spray Commonly known as:  FLONASE Place 2 sprays into the nose daily as needed for allergies.   furosemide 20 MG tablet Commonly known as:  LASIX TAKE 1 TABLET BY MOUTH DAILY AS NEEDED FOR SWELLING   gabapentin 300 MG capsule Commonly known as:  NEURONTIN Take 300 mg by mouth 3 (three) times daily.   glimepiride 1 MG tablet Commonly known as:  AMARYL Take 1 mg by mouth daily after breakfast.   hydrOXYzine 25 MG capsule Commonly known as:  VISTARIL Take 25 mg by mouth at bedtime.   levothyroxine 75 MCG tablet Commonly known as:  SYNTHROID, LEVOTHROID Take 75 mcg by mouth daily before breakfast.   metoprolol succinate 25 MG 24 hr tablet Commonly known as:  TOPROL-XL Take 25 mg by mouth daily.   nortriptyline 75 MG capsule Commonly known as:  PAMELOR Take 150 mg by mouth at bedtime.   omeprazole 40 MG capsule Commonly known as:  PRILOSEC Take 40 mg by mouth 2 (two) times daily.   ranitidine 300 MG capsule Commonly known as:  ZANTAC Take 300 mg by mouth 2 (two) times daily.   spironolactone 25 MG tablet Commonly known as:  ALDACTONE Take 25 mg by mouth 2 (two) times daily.   Vitamin D 2000 units tablet Take 2,000 Units by mouth daily.      Follow-up Information    Wenda Low, MD. Schedule an appointment as soon as possible for a visit in 1 week(s).   Specialty:  Internal Medicine Contact information: [address] [place] [address] [phone]          Allergies  Allergen Reactions  . Clopidogrel     Other  reaction(s): RASH  . Crestor [Rosuvastatin Calcium]     Caused Myalgias  . Effexor [Venlafaxine Hydrochloride] Other (See Comments)    Caused PB to go up  . Hibiclens [Chlorhexidine Gluconate] Hives  . Lipitor [Atorvastatin Calcium]     Causes Rash  . Nardil     Caused BP Problems  . Penicillins     Swelling  . Plavix [Clopidogrel Bisulfate]     Causes Rash  . Rosuvastatin     Muscle aches  . Simvastatin     Causes Rash  . Sulfonamide Derivatives  Rash  . Venlafaxine     BP problems  . Zestril [Lisinopril]     Causes Rash  . Adhesive [Tape] Rash  . Atorvastatin Rash    Consultations: None   Procedures/Studies: Dg Chest 2 View  Result Date: 05/30/2017 CLINICAL DATA:  Headache EXAM: CHEST  2 VIEW COMPARISON:  04/16/2012 FINDINGS: Lungs are clear.  No pleural effusion or pneumothorax. The heart is normal in size.  Right chest pacemaker. Mild degenerative changes of the visualized thoracolumbar spine. IMPRESSION: Normal chest radiographs. Electronically Signed   By: Julian Hy M.D.   On: 05/30/2017 00:26   Ct Head Wo Contrast  Result Date: 05/30/2017 CLINICAL DATA:  Dull headache for 12 hours.  No head injury. EXAM: CT HEAD WITHOUT CONTRAST TECHNIQUE: Contiguous axial images were obtained from the base of the skull through the vertex without intravenous contrast. COMPARISON:  10/24/2015 FINDINGS: Brain: A 2 mm punctate focus of increased density along posterior right frontal cortex towards the vertex (series 2, image 22) is attributed to image noise, without evidence of intracranial hemorrhage elsewhere. No acute infarct, mass, midline shift, or extra-axial fluid collection is identified. Cerebral atrophy is unchanged. Patchy cerebral white matter hypodensities are unchanged and nonspecific but compatible with mild chronic small vessel ischemic disease. Vascular: Calcified atherosclerosis at the skullbase. No hyperdense vessel. Skull: No fracture or focal osseous lesion.  Sinuses/Orbits: Visualized paranasal sinuses and mastoid air cells are clear. Bilateral cataract extraction. Other: None. IMPRESSION: 1. No evidence of acute intracranial abnormality. 2. Mild chronic small vessel ischemic disease. Electronically Signed   By: Logan Bores M.D.   On: 05/30/2017 00:31    Subjective: Feeling back to baseline.  Ate a good breakfast and would like to go home.  Denies fevers, chills, nausea, vomiting, abdominal pains, dysuria, urinary frequency.    Discharge Exam: Vitals:   05/30/17 2258 05/31/17 0611  BP: (!) 155/61 (!) 159/81  Pulse: 82 81  Resp: 15 16  Temp: 98.3 F (36.8 C) 97.7 F (36.5 C)  SpO2: 97% 98%   Vitals:   05/30/17 1330 05/30/17 1510 05/30/17 2258 05/31/17 0611  BP: 128/72 139/70 (!) 155/61 (!) 159/81  Pulse:  88 82 81  Resp: 16 16 15 16   Temp:  98.3 F (36.8 C) 98.3 F (36.8 C) 97.7 F (36.5 C)  TempSrc:  Oral Oral Oral  SpO2:  99% 97% 98%  Weight:  66.9 kg (147 lb 7.8 oz)    Height:  5\' 2"  (1.575 m)      General: Pt is alert, awake, not in acute distress Cardiovascular: RRR, S1/S2 +, no rubs, no gallops Respiratory: CTA bilaterally, no wheezing, no rhonchi Abdominal: Soft, NT, ND, bowel sounds + Extremities: no edema, no cyanosis    The results of significant diagnostics from this hospitalization (including imaging, microbiology, ancillary and laboratory) are listed below for reference.     Microbiology: No results found for this or any previous visit (from the past 240 hour(s)).   Labs: BNP (last 3 results) No results for input(s): BNP in the last 8760 hours. Basic Metabolic Panel:  Recent Labs Lab 05/29/17 2332 05/31/17 0401  NA 139 140  K 3.7 3.8  CL 101 107  CO2 27 26  GLUCOSE 141* 146*  BUN 21* 18  CREATININE 1.30* 0.87  CALCIUM 10.0 9.8   Liver Function Tests:  Recent Labs Lab 05/29/17 2332  AST 34  ALT 36  ALKPHOS 76  BILITOT 0.5  PROT 7.8  ALBUMIN 4.6  No results for input(s): LIPASE,  AMYLASE in the last 168 hours. No results for input(s): AMMONIA in the last 168 hours. CBC:  Recent Labs Lab 05/29/17 2332  WBC 8.6  NEUTROABS 4.7  HGB 14.5  HCT 43.5  MCV 86.3  PLT 241   Cardiac Enzymes:  Recent Labs Lab 05/29/17 2332  TROPONINI <0.03   BNP: Invalid input(s): POCBNP CBG:  Recent Labs Lab 05/30/17 0639 05/30/17 0841 05/30/17 1243 05/30/17 1634 05/31/17 0843  GLUCAP 105* 124* 146* 109* 126*   D-Dimer No results for input(s): DDIMER in the last 72 hours. Hgb A1c No results for input(s): HGBA1C in the last 72 hours. Lipid Profile No results for input(s): CHOL, HDL, LDLCALC, TRIG, CHOLHDL, LDLDIRECT in the last 72 hours. Thyroid function studies No results for input(s): TSH, T4TOTAL, T3FREE, THYROIDAB in the last 72 hours.  Invalid input(s): FREET3 Anemia work up No results for input(s): VITAMINB12, FOLATE, FERRITIN, TIBC, IRON, RETICCTPCT in the last 72 hours. Urinalysis    Component Value Date/Time   COLORURINE YELLOW 05/29/2017 2341   APPEARANCEUR CLOUDY (A) 05/29/2017 2341   LABSPEC 1.022 05/29/2017 2341   PHURINE 7.5 05/29/2017 2341   GLUCOSEU NEGATIVE 05/29/2017 2341   HGBUR NEGATIVE 05/29/2017 2341   BILIRUBINUR NEGATIVE 05/29/2017 2341   KETONESUR NEGATIVE 05/29/2017 2341   PROTEINUR NEGATIVE 05/29/2017 2341   UROBILINOGEN 1.0 10/17/2010 1558   NITRITE NEGATIVE 05/29/2017 2341   LEUKOCYTESUR LARGE (A) 05/29/2017 2341   Sepsis Labs Invalid input(s): PROCALCITONIN,  WBC,  LACTICIDVEN   Time coordinating discharge: Over 30 minutes  SIGNED:   Janece Canterbury, MD  [facility] 05/31/2017, 1:09 PM Pager   If 7PM-7AM, please contact night-coverage [email or web address] Password [email or web address]

## 2017-06-01 LAB — URINE CULTURE: CULTURE: NO GROWTH

## 2017-06-02 LAB — GLUCOSE, CAPILLARY: Glucose-Capillary: 116 mg/dL — ABNORMAL HIGH (ref 65–99)

## 2017-06-04 DIAGNOSIS — D649 Anemia, unspecified: Secondary | ICD-10-CM | POA: Diagnosis not present

## 2017-06-04 DIAGNOSIS — E119 Type 2 diabetes mellitus without complications: Secondary | ICD-10-CM | POA: Diagnosis not present

## 2017-06-04 DIAGNOSIS — I10 Essential (primary) hypertension: Secondary | ICD-10-CM | POA: Diagnosis not present

## 2017-06-04 DIAGNOSIS — E782 Mixed hyperlipidemia: Secondary | ICD-10-CM | POA: Diagnosis not present

## 2017-06-04 DIAGNOSIS — N183 Chronic kidney disease, stage 3 (moderate): Secondary | ICD-10-CM | POA: Diagnosis not present

## 2017-06-04 DIAGNOSIS — E1122 Type 2 diabetes mellitus with diabetic chronic kidney disease: Secondary | ICD-10-CM | POA: Diagnosis not present

## 2017-06-04 DIAGNOSIS — G450 Vertebro-basilar artery syndrome: Secondary | ICD-10-CM | POA: Diagnosis not present

## 2017-06-04 DIAGNOSIS — H409 Unspecified glaucoma: Secondary | ICD-10-CM | POA: Diagnosis not present

## 2017-06-04 DIAGNOSIS — F322 Major depressive disorder, single episode, severe without psychotic features: Secondary | ICD-10-CM | POA: Diagnosis not present

## 2017-06-04 DIAGNOSIS — I480 Paroxysmal atrial fibrillation: Secondary | ICD-10-CM | POA: Diagnosis not present

## 2017-06-04 DIAGNOSIS — E039 Hypothyroidism, unspecified: Secondary | ICD-10-CM | POA: Diagnosis not present

## 2017-06-04 DIAGNOSIS — I2510 Atherosclerotic heart disease of native coronary artery without angina pectoris: Secondary | ICD-10-CM | POA: Diagnosis not present

## 2017-06-06 ENCOUNTER — Encounter: Payer: Self-pay | Admitting: Cardiology

## 2017-06-11 DIAGNOSIS — I480 Paroxysmal atrial fibrillation: Secondary | ICD-10-CM | POA: Diagnosis not present

## 2017-06-11 DIAGNOSIS — N390 Urinary tract infection, site not specified: Secondary | ICD-10-CM | POA: Diagnosis not present

## 2017-06-11 DIAGNOSIS — I10 Essential (primary) hypertension: Secondary | ICD-10-CM | POA: Diagnosis not present

## 2017-06-11 DIAGNOSIS — N183 Chronic kidney disease, stage 3 (moderate): Secondary | ICD-10-CM | POA: Diagnosis not present

## 2017-07-31 DIAGNOSIS — Z23 Encounter for immunization: Secondary | ICD-10-CM | POA: Diagnosis not present

## 2017-08-25 ENCOUNTER — Ambulatory Visit (INDEPENDENT_AMBULATORY_CARE_PROVIDER_SITE_OTHER): Payer: Medicare Other | Admitting: *Deleted

## 2017-08-25 DIAGNOSIS — I495 Sick sinus syndrome: Secondary | ICD-10-CM | POA: Diagnosis not present

## 2017-08-25 NOTE — Progress Notes (Signed)
Remote pacemaker transmission.   

## 2017-08-26 LAB — CUP PACEART REMOTE DEVICE CHECK
Brady Statistic AP VP Percent: 0 %
Brady Statistic AS VS Percent: 98 %
Date Time Interrogation Session: 20181119144626
Implantable Lead Implant Date: 20070710
Implantable Lead Implant Date: 20070710
Implantable Lead Location: 753860
Implantable Lead Serial Number: 483166
Lead Channel Impedance Value: 581 Ohm
Lead Channel Impedance Value: 744 Ohm
Lead Channel Pacing Threshold Amplitude: 0.625 V
Lead Channel Pacing Threshold Pulse Width: 0.4 ms
Lead Channel Setting Sensing Sensitivity: 5.6 mV
MDC IDC LEAD LOCATION: 753859
MDC IDC LEAD SERIAL: 541525
MDC IDC MSMT BATTERY IMPEDANCE: 373 Ohm
MDC IDC MSMT BATTERY REMAINING LONGEVITY: 112 mo
MDC IDC MSMT BATTERY VOLTAGE: 2.79 V
MDC IDC MSMT LEADCHNL RA PACING THRESHOLD AMPLITUDE: 0.625 V
MDC IDC MSMT LEADCHNL RA PACING THRESHOLD PULSEWIDTH: 0.4 ms
MDC IDC PG IMPLANT DT: 20130410
MDC IDC SET LEADCHNL RA PACING AMPLITUDE: 2 V
MDC IDC SET LEADCHNL RV PACING AMPLITUDE: 2.5 V
MDC IDC SET LEADCHNL RV PACING PULSEWIDTH: 0.4 ms
MDC IDC STAT BRADY AP VS PERCENT: 2 %
MDC IDC STAT BRADY AS VP PERCENT: 0 %
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
## 2017-09-03 ENCOUNTER — Encounter: Payer: Self-pay | Admitting: Nurse Practitioner

## 2017-09-03 ENCOUNTER — Ambulatory Visit (INDEPENDENT_AMBULATORY_CARE_PROVIDER_SITE_OTHER): Payer: Medicare Other | Admitting: Nurse Practitioner

## 2017-09-03 VITALS — BP 130/70 | HR 94 | Ht 62.0 in | Wt 149.0 lb

## 2017-09-03 DIAGNOSIS — I495 Sick sinus syndrome: Secondary | ICD-10-CM | POA: Diagnosis not present

## 2017-09-03 DIAGNOSIS — I259 Chronic ischemic heart disease, unspecified: Secondary | ICD-10-CM

## 2017-09-03 DIAGNOSIS — R079 Chest pain, unspecified: Secondary | ICD-10-CM

## 2017-09-03 DIAGNOSIS — I480 Paroxysmal atrial fibrillation: Secondary | ICD-10-CM | POA: Diagnosis not present

## 2017-09-03 DIAGNOSIS — Z950 Presence of cardiac pacemaker: Secondary | ICD-10-CM | POA: Diagnosis not present

## 2017-09-03 NOTE — Patient Instructions (Addendum)
We will be checking the following labs today - NONE   Medication Instructions:    Continue with your current medicines.     Testing/Procedures To Be Arranged:  Lexiscan Myoview  Follow-Up:   See Dr. Caryl Comes next month  See Dr. Marlou Porch in June of 2019     Other Special Instructions:   N/A    If you need a refill on your cardiac medications before your next appointment, please call your pharmacy.   Call the [place] office at [phone] if you have any questions, problems or concerns.

## 2017-09-04 ENCOUNTER — Encounter: Payer: Self-pay | Admitting: Cardiology

## 2017-09-08 ENCOUNTER — Telehealth (HOSPITAL_COMMUNITY): Payer: Self-pay | Admitting: *Deleted

## 2017-09-08 NOTE — Telephone Encounter (Signed)
Patient given detailed instructions per Myocardial Perfusion Study Information Sheet for the test on 09/11/17 Patient notified to arrive 15 minutes early and that it is imperative to arrive on time for appointment to keep from having the test rescheduled.  If you need to cancel or reschedule your appointment, please call the office within 24 hours of your appointment. . Patient verbalized understanding Kirstie Peri

## 2017-09-11 ENCOUNTER — Ambulatory Visit (HOSPITAL_COMMUNITY): Payer: Medicare Other | Attending: Cardiovascular Disease

## 2017-09-11 DIAGNOSIS — R079 Chest pain, unspecified: Secondary | ICD-10-CM | POA: Diagnosis not present

## 2017-09-11 DIAGNOSIS — I259 Chronic ischemic heart disease, unspecified: Secondary | ICD-10-CM | POA: Insufficient documentation

## 2017-09-11 LAB — MYOCARDIAL PERFUSION IMAGING
LV dias vol: 65 mL (ref 46–106)
LV sys vol: 17 mL
Peak HR: 100 {beats}/min
RATE: 0.33
Rest HR: 82 {beats}/min
SDS: 1
SRS: 2
SSS: 3
TID: 1.03

## 2017-09-11 MED ORDER — TECHNETIUM TC 99M TETROFOSMIN IV KIT
32.7000 | PACK | Freq: Once | INTRAVENOUS | Status: AC | PRN
Start: 2017-09-11 — End: 2017-09-11
  Administered 2017-09-11: 32.7 via INTRAVENOUS
  Filled 2017-09-11: qty 33

## 2017-09-11 MED ORDER — TECHNETIUM TC 99M TETROFOSMIN IV KIT
10.0000 | PACK | Freq: Once | INTRAVENOUS | Status: AC | PRN
  Administered 2017-09-11: 10 via INTRAVENOUS
  Filled 2017-09-11: qty 10

## 2017-09-11 MED ORDER — REGADENOSON 0.4 MG/5ML IV SOLN
0.4000 mg | Freq: Once | INTRAVENOUS | Status: AC
  Administered 2017-09-11: 0.4 mg via INTRAVENOUS

## 2017-09-12 ENCOUNTER — Telehealth: Payer: Self-pay | Admitting: *Deleted

## 2017-09-12 ENCOUNTER — Encounter: Payer: Self-pay | Admitting: Internal Medicine

## 2017-09-12 NOTE — Telephone Encounter (Signed)
-----   Message from Burtis Junes, NP sent at 09/12/2017  9:27 AM EST ----- Good morning,   Could someone there do me a favor and make sure this Myoview gets reported?  I had sent it to [facility] but she is not there.   Thanks   lori

## 2017-09-12 NOTE — Telephone Encounter (Signed)
Pt aware of myoview results ./cy 

## 2017-10-03 ENCOUNTER — Ambulatory Visit (INDEPENDENT_AMBULATORY_CARE_PROVIDER_SITE_OTHER): Payer: Medicare Other | Admitting: Internal Medicine

## 2017-10-03 ENCOUNTER — Encounter: Payer: Self-pay | Admitting: Internal Medicine

## 2017-10-03 VITALS — BP 146/86 | HR 101 | Ht 62.0 in | Wt 142.0 lb

## 2017-10-03 DIAGNOSIS — I495 Sick sinus syndrome: Secondary | ICD-10-CM

## 2017-10-03 DIAGNOSIS — Z950 Presence of cardiac pacemaker: Secondary | ICD-10-CM

## 2017-10-03 DIAGNOSIS — I480 Paroxysmal atrial fibrillation: Secondary | ICD-10-CM

## 2017-10-03 DIAGNOSIS — I259 Chronic ischemic heart disease, unspecified: Secondary | ICD-10-CM | POA: Diagnosis not present

## 2017-10-03 LAB — CUP PACEART INCLINIC DEVICE CHECK
Brady Statistic AP VS Percent: 1 %
Brady Statistic AS VP Percent: 0 %
Implantable Lead Implant Date: 20070710
Implantable Lead Model: 4470
Implantable Lead Serial Number: 541525
Lead Channel Impedance Value: 581 Ohm
Lead Channel Pacing Threshold Amplitude: 0.625 V
Lead Channel Pacing Threshold Amplitude: 0.625 V
Lead Channel Pacing Threshold Amplitude: 0.75 V
Lead Channel Pacing Threshold Amplitude: 0.75 V
Lead Channel Pacing Threshold Pulse Width: 0.4 ms
Lead Channel Sensing Intrinsic Amplitude: 15.67 mV
Lead Channel Sensing Intrinsic Amplitude: 4 mV
Lead Channel Setting Pacing Amplitude: 2 V
MDC IDC LEAD IMPLANT DT: 20070710
MDC IDC LEAD LOCATION: 753859
MDC IDC LEAD LOCATION: 753860
MDC IDC LEAD SERIAL: 483166
MDC IDC MSMT BATTERY IMPEDANCE: 324 Ohm
MDC IDC MSMT BATTERY REMAINING LONGEVITY: 117 mo
MDC IDC MSMT BATTERY VOLTAGE: 2.79 V
MDC IDC MSMT LEADCHNL RA PACING THRESHOLD PULSEWIDTH: 0.4 ms
MDC IDC MSMT LEADCHNL RA PACING THRESHOLD PULSEWIDTH: 0.4 ms
MDC IDC MSMT LEADCHNL RV IMPEDANCE VALUE: 776 Ohm
MDC IDC MSMT LEADCHNL RV PACING THRESHOLD PULSEWIDTH: 0.4 ms
MDC IDC PG IMPLANT DT: 20130410
MDC IDC SESS DTM: 20181228170012
MDC IDC SET LEADCHNL RV PACING AMPLITUDE: 2.5 V
MDC IDC SET LEADCHNL RV PACING PULSEWIDTH: 0.4 ms
MDC IDC SET LEADCHNL RV SENSING SENSITIVITY: 5.6 mV
MDC IDC STAT BRADY AP VP PERCENT: 0 %
MDC IDC STAT BRADY AS VS PERCENT: 98 %

## 2017-10-03 MED ORDER — METOPROLOL SUCCINATE ER 50 MG PO TB24: 50.0000 mg | ORAL_TABLET | Freq: Every day | ORAL | 11 refills | Status: DC

## 2017-10-03 MED ORDER — FUROSEMIDE 20 MG PO TABS: 20.0000 mg | ORAL_TABLET | ORAL | 10 refills | Status: DC

## 2017-10-03 MED ORDER — SPIRONOLACTONE 25 MG PO TABS: 25.0000 mg | ORAL_TABLET | Freq: Every day | ORAL | 11 refills | Status: AC

## 2017-10-03 NOTE — Progress Notes (Signed)
Patient Care Team: Wenda Low, MD as PCP - General (Internal Medicine)   HPI  Heidi [name] is a 76 y.o. 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   She has had paroxysmal atrial fibrillation with a CHADS-VASc score of 3  She does not take anticoag   She is doing exceptionally well. She had a great time at the beach with her "red hat ladies".  She just recently returned from [place].  She has complaints of progressive peripheral edema. Her medications include spironolactone twice daily.  She was hospitalized in the summer with change in mental status and elevated blood pressure and creatinine was elevated.  At that time her edema had resolved.  Her furosemide was discontinued  Cardiac catheterization was performed on 02/12/2006 which showed minor irregularities, mid RCA 40%, left main normal, LAD normal, ejection fraction 65%.   9/17 Echo normal LV function  12/18 Myoview without ischemia   Date Cr K  9/17 0.93 3.7  8/18 1.3 3.7   She also notes that she has an elevated heart rate.  This does not seem to impair exercise tolerance.  She has had chronic diarrhea since returning from [place].      Past Medical History:  Diagnosis Date  . Anxiety   . Atrial fibrillation (HCC)    Intermittent  . CAD (coronary artery disease)    non obstructive  . CKD (chronic kidney disease)   . Diabetes mellitus without complication ([place])   . Dysrhythmia    PAF  . GERD (gastroesophageal reflux disease)   . Glaucoma    [place]  . HTN (hypertension)   . Hyperlipidemia   . Hypothyroid   . Major depression    10-17-2010: hospitalized for suicidal, [name] (D/C 10-30-2010)  . Migraine headache   . OSA (obstructive sleep apnea)    CPAP nightly  . Peripheral neuropathy   . Seasonal allergies   . Sleep apnea   . Tachy-brady syndrome ([place]) 04-2006   PPM placed    Past Surgical History:  Procedure Laterality Date  . ABDOMINAL ADHESION SURGERY  2008   exploratory to remove attached to the abdominal wall, stomach and intestines.   . ABDOMINAL HYSTERECTOMY  1976  . APPENDECTOMY    . BREAST SURGERY     fibroids removed, benign  . CARDIAC CATHETERIZATION  02-12-2006   Minor irregularities: RCA-mid 40%, LM-normal, LAD-normal. EF 65%.  . CHOLECYSTECTOMY    . ECTOPIC PREGNANCY SURGERY     treatment x3, last one 11-02-2009 at [facility]  . goiter    . goiter removed    . MASS EXCISION Right 10/18/2015   Procedure: EXCISION OF 6CM MASS ON RIGHT  NECK/BACK;  Surgeon: [name], MD;  Location: [place];  Service: General;  Laterality: Right;  . PACEMAKER GENERATOR CHANGE N/A 01/15/2012   Procedure: PACEMAKER GENERATOR CHANGE;  Surgeon: Deboraha Sprang, MD;  Location: [facility] CATH LAB;  Service: Cardiovascular;  Laterality: N/A;  . PACEMAKER INSERTION     Permanent. Medtronic EnRhyth 04/2006, set as DDDR  . TUMOR REMOVAL  2000   Fibroid from breast x4-most recent  . WRIST FRACTURE SURGERY  11-2007   right, had metal plate inserted    Current Outpatient Medications  Medication Sig Dispense Refill  . aspirin EC 81 MG tablet Take 81 mg by mouth every evening.     Marland Kitchen  cetirizine (ZYRTEC) 10 MG tablet Take 10 mg by mouth daily after breakfast.     . Cholecalciferol (VITAMIN D) 2000 UNITS tablet Take 2,000 Units by mouth daily.      Marland Kitchen donepezil (ARICEPT) 10 MG tablet Take 10 mg by mouth daily after breakfast.     . fenofibrate (TRICOR) 145 MG tablet Take 145 mg by mouth at bedtime.     . ferrous sulfate (FERROUSUL) 325 (65 FE) MG tablet Take 325 mg by mouth daily with breakfast.    . fluticasone (FLONASE) 50 MCG/ACT nasal spray Place 2 sprays into the nose daily as needed for allergies.     . furosemide (LASIX) 20 MG tablet Take 1 tablet (20 mg total) by mouth 3 (three) times a week. 15 tablet 10  . gabapentin (NEURONTIN) 300 MG  capsule Take 300 mg by mouth 3 (three) times daily.      Marland Kitchen glimepiride (AMARYL) 1 MG tablet Take 1 mg by mouth daily after breakfast.    . hydrOXYzine (VISTARIL) 25 MG capsule Take 25 mg by mouth at bedtime.     Marland Kitchen levothyroxine (SYNTHROID, LEVOTHROID) 75 MCG tablet Take 75 mcg by mouth daily before breakfast.     . metoprolol succinate (TOPROL-XL) 50 MG 24 hr tablet Take 1 tablet (50 mg total) by mouth daily. 30 tablet 11  . nortriptyline (PAMELOR) 75 MG capsule Take 150 mg by mouth at bedtime.     Marland Kitchen omeprazole (PRILOSEC) 40 MG capsule Take 40 mg by mouth 2 (two) times daily.     . ranitidine (ZANTAC) 300 MG capsule Take 300 mg by mouth 2 (two) times daily.     Marland Kitchen spironolactone (ALDACTONE) 25 MG tablet Take 1 tablet (25 mg total) by mouth daily. 30 tablet 11   No current facility-administered medications for this visit.     Allergies  Allergen Reactions  . Clopidogrel     Other reaction(s): RASH  . Crestor [Rosuvastatin Calcium]     Caused Myalgias  . Effexor [Venlafaxine Hydrochloride] Other (See Comments)    Caused PB to go up  . Hibiclens [Chlorhexidine Gluconate] Hives  . Lipitor [Atorvastatin Calcium]     Causes Rash  . Nardil     Caused BP Problems  . Penicillins     Swelling  . Plavix [Clopidogrel Bisulfate]     Causes Rash  . Rosuvastatin     Muscle aches  . Simvastatin     Causes Rash  . Sulfonamide Derivatives     Rash  . Venlafaxine     BP problems  . Zestril [Lisinopril]     Causes Rash  . Adhesive [Tape] Rash  . Atorvastatin Rash    Review of Systems negative except from HPI and PMH  Physical Exam BP (!) 146/86   Pulse (!) 101   Ht 5\' 2"  (1.575 m)   Wt 142 lb (64.4 kg)   SpO2 98%   BMI 25.97 kg/m  Well developed and well nourished in no acute distress Well developed and nourished in no acute distress HENT normal Neck supple with JVP-flat Carotids brisk and full without bruits Clear Regular rate and rhythm, no murmurs or gallops Abd-soft with  active BS without hepatomegaly No Clubbing cyanosis 3+edema Skin-warm and dry A & Oriented  Grossly normal sensory and motor function   ECG dated 916 demonstrated demonstrates sinus rhythm 101 19/11/36Axis left -50   Assessment and  Plan  Sinus tachycardia  Sinus bradycardia/tachycardia bradycardia syndrome  Atrial fibrillation-paroxysmal  Hypertension  HFpEF  Her heart rate remains fast.  We will increase her metoprolol from 25--50.  Her edema is more  problematic since the discontinuation of her furosemide during the summer.  Looking back her creatinine reached   1.3 with a quick return 0.8.  I think she could probably tolerate intermittent furosemide and will resume it 2-3 days a week.  No interval afib  Not on anticoagulation   BP is elevated hopefully increasing the diuretic will address this  We spent more than 50% of our >25 min visit in face to face counseling regarding the above      Virl Axe

## 2017-10-03 NOTE — Patient Instructions (Signed)
Medication Instructions: Your physician has recommended you make the following change in your medication:  -1) CHANGE Furosemide 20 mg - Take 1 tablet (20 mg) by mouth three times weekly - NEW RX SENT -2) INCREASE Metoprolol Succinate (Toprol XL) 50 mg - Take 1 tablet (50 mg) by mouth daily in the morning - NEW RX SENT -3) DECREASE Spironolactone (Aldactone) 25 mg - Take 1 tablet (25 mg) by mouth daily - NEW RX SENT  Labwork: None Ordered  Procedures/Testing: None Ordered  Follow-Up: Your physician wants you to follow-up in: 1 YEAR with Loann Quill, NP. You will receive a reminder letter in the mail two months in advance. If you don't receive a letter, please call our office to schedule the follow-up appointment.  Remote monitoring is used to monitor your Pacemaker from home. This monitoring reduces the number of office visits required to check your device to one time per year. It allows [place] to keep an eye on the functioning of your device to ensure it is working properly. You are scheduled for a device check from home on 11/25/17. You may send your transmission at any time that day. If you have a wireless device, the transmission will be sent automatically. After your physician reviews your transmission, you will receive a postcard with your next transmission date.   If you need a refill on your cardiac medications before your next appointment, please call your pharmacy.

## 2017-10-27 DIAGNOSIS — I10 Essential (primary) hypertension: Secondary | ICD-10-CM | POA: Diagnosis not present

## 2017-10-27 DIAGNOSIS — N183 Chronic kidney disease, stage 3 (moderate): Secondary | ICD-10-CM | POA: Diagnosis not present

## 2017-10-27 DIAGNOSIS — E1122 Type 2 diabetes mellitus with diabetic chronic kidney disease: Secondary | ICD-10-CM | POA: Diagnosis not present

## 2017-11-24 ENCOUNTER — Ambulatory Visit (INDEPENDENT_AMBULATORY_CARE_PROVIDER_SITE_OTHER): Payer: Medicare Other | Admitting: *Deleted

## 2017-11-24 DIAGNOSIS — I495 Sick sinus syndrome: Secondary | ICD-10-CM

## 2017-11-24 NOTE — Progress Notes (Signed)
Remote pacemaker transmission.   

## 2017-11-25 LAB — CUP PACEART REMOTE DEVICE CHECK
Brady Statistic AP VP Percent: 0 %
Brady Statistic AP VS Percent: 1 %
Brady Statistic AS VP Percent: 0 %
Implantable Lead Implant Date: 20070710
Implantable Lead Implant Date: 20070710
Implantable Lead Location: 753859
Implantable Lead Location: 753860
Implantable Lead Model: 4470
Implantable Lead Serial Number: 541525
Lead Channel Pacing Threshold Amplitude: 0.625 V
Lead Channel Pacing Threshold Amplitude: 0.625 V
Lead Channel Pacing Threshold Pulse Width: 0.4 ms
Lead Channel Pacing Threshold Pulse Width: 0.4 ms
Lead Channel Setting Pacing Amplitude: 2.5 V
MDC IDC LEAD SERIAL: 483166
MDC IDC MSMT BATTERY IMPEDANCE: 399 Ohm
MDC IDC MSMT BATTERY REMAINING LONGEVITY: 110 mo
MDC IDC MSMT BATTERY VOLTAGE: 2.78 V
MDC IDC MSMT LEADCHNL RA IMPEDANCE VALUE: 590 Ohm
MDC IDC MSMT LEADCHNL RV IMPEDANCE VALUE: 737 Ohm
MDC IDC PG IMPLANT DT: 20130410
MDC IDC SESS DTM: 20190218134018
MDC IDC SET LEADCHNL RA PACING AMPLITUDE: 2 V
MDC IDC SET LEADCHNL RV PACING PULSEWIDTH: 0.4 ms
MDC IDC SET LEADCHNL RV SENSING SENSITIVITY: 5.6 mV
MDC IDC STAT BRADY AS VS PERCENT: 99 %

## 2017-11-26 ENCOUNTER — Encounter: Payer: Self-pay | Admitting: Cardiology

## 2017-12-08 DIAGNOSIS — H401422 Capsular glaucoma with pseudoexfoliation of lens, left eye, moderate stage: Secondary | ICD-10-CM | POA: Diagnosis not present

## 2017-12-08 DIAGNOSIS — H401412 Capsular glaucoma with pseudoexfoliation of lens, right eye, moderate stage: Secondary | ICD-10-CM | POA: Diagnosis not present

## 2018-01-27 DIAGNOSIS — D649 Anemia, unspecified: Secondary | ICD-10-CM | POA: Diagnosis not present

## 2018-01-27 DIAGNOSIS — E1122 Type 2 diabetes mellitus with diabetic chronic kidney disease: Secondary | ICD-10-CM | POA: Diagnosis not present

## 2018-01-27 DIAGNOSIS — I10 Essential (primary) hypertension: Secondary | ICD-10-CM | POA: Diagnosis not present

## 2018-01-27 DIAGNOSIS — I480 Paroxysmal atrial fibrillation: Secondary | ICD-10-CM | POA: Diagnosis not present

## 2018-01-27 DIAGNOSIS — E039 Hypothyroidism, unspecified: Secondary | ICD-10-CM | POA: Diagnosis not present

## 2018-01-27 DIAGNOSIS — G450 Vertebro-basilar artery syndrome: Secondary | ICD-10-CM | POA: Diagnosis not present

## 2018-01-27 DIAGNOSIS — I2510 Atherosclerotic heart disease of native coronary artery without angina pectoris: Secondary | ICD-10-CM | POA: Diagnosis not present

## 2018-01-27 DIAGNOSIS — F329 Major depressive disorder, single episode, unspecified: Secondary | ICD-10-CM | POA: Diagnosis not present

## 2018-01-27 DIAGNOSIS — N183 Chronic kidney disease, stage 3 (moderate): Secondary | ICD-10-CM | POA: Diagnosis not present

## 2018-01-27 DIAGNOSIS — E782 Mixed hyperlipidemia: Secondary | ICD-10-CM | POA: Diagnosis not present

## 2018-01-27 DIAGNOSIS — H409 Unspecified glaucoma: Secondary | ICD-10-CM | POA: Diagnosis not present

## 2018-01-27 DIAGNOSIS — Z7984 Long term (current) use of oral hypoglycemic drugs: Secondary | ICD-10-CM | POA: Diagnosis not present

## 2018-02-23 ENCOUNTER — Encounter: Payer: Self-pay | Admitting: Cardiology

## 2018-02-23 ENCOUNTER — Ambulatory Visit (INDEPENDENT_AMBULATORY_CARE_PROVIDER_SITE_OTHER): Payer: Medicare Other | Admitting: *Deleted

## 2018-02-23 ENCOUNTER — Ambulatory Visit (INDEPENDENT_AMBULATORY_CARE_PROVIDER_SITE_OTHER): Payer: Medicare Other | Admitting: Cardiology

## 2018-02-23 VITALS — BP 134/82 | HR 101 | Ht 62.0 in | Wt 145.8 lb

## 2018-02-23 DIAGNOSIS — I495 Sick sinus syndrome: Secondary | ICD-10-CM

## 2018-02-23 DIAGNOSIS — I480 Paroxysmal atrial fibrillation: Secondary | ICD-10-CM

## 2018-02-23 DIAGNOSIS — Z950 Presence of cardiac pacemaker: Secondary | ICD-10-CM

## 2018-02-23 NOTE — Patient Instructions (Signed)

## 2018-02-23 NOTE — Progress Notes (Signed)
Remote pacemaker transmission.   

## 2018-02-23 NOTE — Progress Notes (Signed)
[place]. [address], [address], [place]  [address] Phone: [phone] Fax:  [phone]  Date:  02/23/2018   ID:  Heidi [name], DOB 22-Jun-1941, MRN [id number]  PCP:  Wenda Low, MD   History of Present Illness: Heidi [name] is a 77 y.o. female with hyperlipidemia, paroxysmal atrial fibrillation (no recent episode), hypertension, obstructive sleep apnea status post pacemaker placement in 2007 secondary to tachycardia/bradycardia syndrome with history of depression/ECT treatment.  She underwent generator replacement in spring of 2013. She has a history of paroxysmal atrial fibrillation with a CHADS-VASc score of 3.   Cardiac catheterization was performed on 02/12/2006 which showed minor irregularities, mid RCA 40%, left main normal, LAD normal, ejection fraction 65%. She has infrequent ventricular rate or pacing less than 5%.   Per Dr. Olin Pia note as is stated above she has a history of paroxysmal atrial fibrillation but is not on current anticoagulation therapy.  She enjoys going to the beach every year with her red hat women  Main complaint however is lower extremity and pedal edema. Better in the mornings. She does not drink excessive water. She is on spironolactone.  Over the past month however she is felt more run down, fatigued, short of breath with activity. No fevers, no mucus production. Checking lab work.  Wt Readings from Last 3 Encounters:  02/23/18 145 lb 12.8 oz (66.1 kg)  10/03/17 142 lb (64.4 kg)  09/03/17 149 lb (67.6 kg)     Past Medical History:  Diagnosis Date  . Anxiety   . Atrial fibrillation (HCC)    Intermittent  . CAD (coronary artery disease)    non obstructive  . CKD (chronic kidney disease)   . Diabetes mellitus without complication ([place])   . Dysrhythmia    PAF  . GERD (gastroesophageal reflux disease)   . Glaucoma    [place]  . HTN (hypertension)   . Hyperlipidemia   . Hypothyroid   . Major depression    10-17-2010:  hospitalized for suicidal, [name] (D/C 10-30-2010)  . Migraine headache   . OSA (obstructive sleep apnea)    CPAP nightly  . Peripheral neuropathy   . Seasonal allergies   . Sleep apnea   . Tachy-brady syndrome ([place]) 04-2006   PPM placed    Past Surgical History:  Procedure Laterality Date  . ABDOMINAL ADHESION SURGERY  2008   exploratory to remove attached to the abdominal wall, stomach and intestines.   . ABDOMINAL HYSTERECTOMY  1976  . APPENDECTOMY    . BREAST SURGERY     fibroids removed, benign  . CARDIAC CATHETERIZATION  02-12-2006   Minor irregularities: RCA-mid 40%, LM-normal, LAD-normal. EF 65%.  . CHOLECYSTECTOMY    . ECTOPIC PREGNANCY SURGERY     treatment x3, last one 11-02-2009 at [facility]  . goiter    . goiter removed    . MASS EXCISION Right 10/18/2015   Procedure: EXCISION OF 6CM MASS ON RIGHT  NECK/BACK;  Surgeon: [name], MD;  Location: [facility];  Service: General;  Laterality: Right;  . PACEMAKER GENERATOR CHANGE N/A 01/15/2012   Procedure: PACEMAKER GENERATOR CHANGE;  Surgeon: Deboraha Sprang, MD;  [facility] C[facility] LAB;  Service: Cardiovascular;  Laterality: N/A;  . PACEMAKER INSERTION     Permanent. Medtronic EnRhyth 04/2006, set as DDDR  . TUMOR REMOVAL  2000   Fibroid from breast x4-most recent  . WRIST FRACTURE SURGERY  11-2007  right, had metal plate inserted    Current Outpatient Medications  Medication Sig Dispense Refill  . aspirin EC 81 MG tablet Take 81 mg by mouth every evening.     . cetirizine (ZYRTEC) 10 MG tablet Take 10 mg by mouth daily after breakfast.     . Cholecalciferol (VITAMIN D) 2000 UNITS tablet Take 2,000 Units by mouth daily.      Marland Kitchen donepezil (ARICEPT) 10 MG tablet Take 10 mg by mouth daily after breakfast.     . fenofibrate (TRICOR) 145 MG tablet Take 145 mg by mouth at bedtime.     . ferrous sulfate (FERROUSUL) 325 (65 FE) MG tablet Take 325 mg by mouth daily with  breakfast.    . fluticasone (FLONASE) 50 MCG/ACT nasal spray Place 2 sprays into the nose daily as needed for allergies.     . furosemide (LASIX) 20 MG tablet Take 1 tablet (20 mg total) by mouth 3 (three) times a week. 15 tablet 10  . gabapentin (NEURONTIN) 300 MG capsule Take 300 mg by mouth 3 (three) times daily.      Marland Kitchen glimepiride (AMARYL) 1 MG tablet Take 1 mg by mouth daily after breakfast.    . hydrOXYzine (VISTARIL) 25 MG capsule Take 25 mg by mouth at bedtime.     Marland Kitchen levothyroxine (SYNTHROID, LEVOTHROID) 75 MCG tablet Take 75 mcg by mouth daily before breakfast.     . metoprolol succinate (TOPROL-XL) 50 MG 24 hr tablet Take 1 tablet (50 mg total) by mouth daily. 30 tablet 11  . nortriptyline (PAMELOR) 75 MG capsule Take 150 mg by mouth at bedtime.     Marland Kitchen omeprazole (PRILOSEC) 40 MG capsule Take 40 mg by mouth daily.     . ranitidine (ZANTAC) 300 MG capsule Take 300 mg by mouth 2 (two) times daily.     Marland Kitchen spironolactone (ALDACTONE) 25 MG tablet Take 1 tablet (25 mg total) by mouth daily. 30 tablet 11   No current facility-administered medications for this visit.     Allergies:    Allergies  Allergen Reactions  . Clopidogrel     Other reaction(s): RASH  . Crestor [Rosuvastatin Calcium]     Caused Myalgias  . Effexor [Venlafaxine Hydrochloride] Other (See Comments)    Caused PB to go up  . Hibiclens [Chlorhexidine Gluconate] Hives  . Lipitor [Atorvastatin Calcium]     Causes Rash  . Nardil     Caused BP Problems  . Penicillins     Swelling  . Plavix [Clopidogrel Bisulfate]     Causes Rash  . Rosuvastatin     Muscle aches  . Simvastatin     Causes Rash  . Sulfonamide Derivatives     Rash  . Venlafaxine     BP problems  . Zestril [Lisinopril]     Causes Rash  . Adhesive [Tape] Rash  . Atorvastatin Rash    Social History:  The patient  reports that she has quit smoking. She has never used smokeless tobacco. She reports that she does not drink alcohol or use drugs.    Family History  Problem Relation Age of Onset  . Stomach cancer Father   . Atrial fibrillation Brother     ROS:  Please see the history of present illness.   No syncope bleeding orthopnea PND.  PHYSICAL EXAM: VS:  BP 134/82   Pulse (!) 101   Ht 5\' 2"  (1.575 m)   Wt 145 lb 12.8 oz (66.1 kg)   BMI  26.67 kg/m  GEN: Well nourished, well developed, in no acute distress  HEENT: normal  Neck: no JVD, carotid bruits, or masses Cardiac: Reg mildly tacycardic; no murmurs, rubs, or gallops,no edema  Respiratory:  clear to auscultation bilaterally, normal work of breathing GI: soft, nontender, nondistended, + BS MS: no deformity or atrophy  Skin: warm and dry, no rash Neuro:  Alert and Oriented x 3, Strength and sensation are intact Psych: euthymic mood, full affect   EKG: 06/15/15-sinus rhythm, left axis deviation, 80, personally viewed. Pacemaker interrogated previously.  ASSESSMENT AND PLAN:  1. Paroxysmal atrial fibrillation- CHADS-VASc score is 3. I encouraged her to take anticoagulation. Her daughter is a Marine scientist. She will think about it.  She understands risks of stroke. Continue to monitor for any events on pacemaker.  Maintaining sinus rhythm.  Heart rate remains somewhat elevated.  Metoprolol 50 from 25.  Dr. Caryl Comes adjusted.  Still approximally 100.  Since she has a pacemaker in place her heart rate can be monitored very closely, I will wait until she sees Dr. Caryl Comes again before making any further adjustments.  She states that her heart rate increased after visiting [place] 2 years ago, had diarrhea on the plane.  She still is battling with diarrhea.  It would be interesting to know if her heart rate is fluctuating at all.  Today she was 101.  On October 03, 2017 EKG was 101 also.  Could this be an incessant atrial tachycardia. 2. Chest pain- Recent stress test 09/11/2017-low risk, no ischemia. 3. Pacemaker-functioning well. Dr. Caryl Comes.  Prior office note reviewed 4. Edema of, lower  extremities-continue with spironolactone and 3 times a week Lasix. Avoid excessive fluid intake. Exercise, walking, weight loss can help with this. Previously offered her a short-term course of Lasix but she did not wish to use this medication.  Dr. Caryl Comes saw her in December and offered her furosemide intermittently which she is currently taking. 5. Hypertension-well-controlled medications reviewed.  No changes made. 6. Vertigo-per neurology. 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  Sometimes when she moves her eyes up and down quickly she feels off balance. 7. One year follow up.   Signed, Candee Furbish, MD [facility]  02/23/2018 2:27 PM

## 2018-02-24 LAB — CUP PACEART REMOTE DEVICE CHECK
Battery Impedance: 423 Ohm
Battery Remaining Longevity: 107 mo
Battery Voltage: 2.79 V
Brady Statistic AP VS Percent: 1 %
Brady Statistic AS VP Percent: 0 %
Implantable Lead Implant Date: 20070710
Implantable Lead Location: 753859
Implantable Lead Location: 753860
Implantable Lead Model: 4469
Implantable Lead Model: 4470
Implantable Lead Serial Number: 541525
Implantable Pulse Generator Implant Date: 20130410
Lead Channel Pacing Threshold Amplitude: 0.75 V
Lead Channel Pacing Threshold Pulse Width: 0.4 ms
Lead Channel Pacing Threshold Pulse Width: 0.4 ms
Lead Channel Setting Pacing Amplitude: 2 V
Lead Channel Setting Pacing Amplitude: 2.5 V
Lead Channel Setting Pacing Pulse Width: 0.4 ms
MDC IDC LEAD IMPLANT DT: 20070710
MDC IDC LEAD SERIAL: 483166
MDC IDC MSMT LEADCHNL RA IMPEDANCE VALUE: 572 Ohm
MDC IDC MSMT LEADCHNL RV IMPEDANCE VALUE: 721 Ohm
MDC IDC MSMT LEADCHNL RV PACING THRESHOLD AMPLITUDE: 0.875 V
MDC IDC SESS DTM: 20190520122954
MDC IDC SET LEADCHNL RV SENSING SENSITIVITY: 5.6 mV
MDC IDC STAT BRADY AP VP PERCENT: 0 %
MDC IDC STAT BRADY AS VS PERCENT: 99 %

## 2018-03-10 ENCOUNTER — Other Ambulatory Visit: Payer: Self-pay | Admitting: Internal Medicine

## 2018-03-10 DIAGNOSIS — Z135 Encounter for screening for eye and ear disorders: Secondary | ICD-10-CM | POA: Diagnosis not present

## 2018-03-10 DIAGNOSIS — N183 Chronic kidney disease, stage 3 (moderate): Secondary | ICD-10-CM | POA: Diagnosis not present

## 2018-03-10 DIAGNOSIS — Z1231 Encounter for screening mammogram for malignant neoplasm of breast: Secondary | ICD-10-CM

## 2018-03-10 DIAGNOSIS — F329 Major depressive disorder, single episode, unspecified: Secondary | ICD-10-CM | POA: Diagnosis not present

## 2018-03-10 DIAGNOSIS — G4733 Obstructive sleep apnea (adult) (pediatric): Secondary | ICD-10-CM | POA: Diagnosis not present

## 2018-03-10 DIAGNOSIS — R413 Other amnesia: Secondary | ICD-10-CM | POA: Diagnosis not present

## 2018-03-10 DIAGNOSIS — G629 Polyneuropathy, unspecified: Secondary | ICD-10-CM | POA: Diagnosis not present

## 2018-03-10 DIAGNOSIS — E782 Mixed hyperlipidemia: Secondary | ICD-10-CM | POA: Diagnosis not present

## 2018-03-10 DIAGNOSIS — I2510 Atherosclerotic heart disease of native coronary artery without angina pectoris: Secondary | ICD-10-CM | POA: Diagnosis not present

## 2018-03-10 DIAGNOSIS — E039 Hypothyroidism, unspecified: Secondary | ICD-10-CM | POA: Diagnosis not present

## 2018-03-10 DIAGNOSIS — Z1389 Encounter for screening for other disorder: Secondary | ICD-10-CM | POA: Diagnosis not present

## 2018-03-10 DIAGNOSIS — Z1239 Encounter for other screening for malignant neoplasm of breast: Secondary | ICD-10-CM | POA: Diagnosis not present

## 2018-03-10 DIAGNOSIS — K219 Gastro-esophageal reflux disease without esophagitis: Secondary | ICD-10-CM | POA: Diagnosis not present

## 2018-03-10 DIAGNOSIS — I10 Essential (primary) hypertension: Secondary | ICD-10-CM | POA: Diagnosis not present

## 2018-03-10 DIAGNOSIS — E1122 Type 2 diabetes mellitus with diabetic chronic kidney disease: Secondary | ICD-10-CM | POA: Diagnosis not present

## 2018-03-10 DIAGNOSIS — Z7984 Long term (current) use of oral hypoglycemic drugs: Secondary | ICD-10-CM | POA: Diagnosis not present

## 2018-03-10 DIAGNOSIS — Z0000 Encounter for general adult medical examination without abnormal findings: Secondary | ICD-10-CM | POA: Diagnosis not present

## 2018-03-12 ENCOUNTER — Ambulatory Visit
Admission: RE | Admit: 2018-03-12 | Discharge: 2018-03-12 | Disposition: A | Discharge status: Home / Self Care | Payer: Medicare Other | Source: Ambulatory Visit | Attending: Internal Medicine | Admitting: Internal Medicine

## 2018-03-12 DIAGNOSIS — Z1231 Encounter for screening mammogram for malignant neoplasm of breast: Secondary | ICD-10-CM | POA: Diagnosis not present

## 2018-03-17 DIAGNOSIS — G4733 Obstructive sleep apnea (adult) (pediatric): Secondary | ICD-10-CM | POA: Diagnosis not present

## 2018-03-27 DIAGNOSIS — D224 Melanocytic nevi of scalp and neck: Secondary | ICD-10-CM | POA: Diagnosis not present

## 2018-04-03 DIAGNOSIS — Z7984 Long term (current) use of oral hypoglycemic drugs: Secondary | ICD-10-CM | POA: Diagnosis not present

## 2018-04-03 DIAGNOSIS — E039 Hypothyroidism, unspecified: Secondary | ICD-10-CM | POA: Diagnosis not present

## 2018-04-03 DIAGNOSIS — F322 Major depressive disorder, single episode, severe without psychotic features: Secondary | ICD-10-CM | POA: Diagnosis not present

## 2018-04-03 DIAGNOSIS — F329 Major depressive disorder, single episode, unspecified: Secondary | ICD-10-CM | POA: Diagnosis not present

## 2018-04-03 DIAGNOSIS — E1122 Type 2 diabetes mellitus with diabetic chronic kidney disease: Secondary | ICD-10-CM | POA: Diagnosis not present

## 2018-04-03 DIAGNOSIS — I480 Paroxysmal atrial fibrillation: Secondary | ICD-10-CM | POA: Diagnosis not present

## 2018-04-03 DIAGNOSIS — D649 Anemia, unspecified: Secondary | ICD-10-CM | POA: Diagnosis not present

## 2018-04-03 DIAGNOSIS — E782 Mixed hyperlipidemia: Secondary | ICD-10-CM | POA: Diagnosis not present

## 2018-04-03 DIAGNOSIS — I10 Essential (primary) hypertension: Secondary | ICD-10-CM | POA: Diagnosis not present

## 2018-04-03 DIAGNOSIS — N183 Chronic kidney disease, stage 3 (moderate): Secondary | ICD-10-CM | POA: Diagnosis not present

## 2018-04-03 DIAGNOSIS — I2510 Atherosclerotic heart disease of native coronary artery without angina pectoris: Secondary | ICD-10-CM | POA: Diagnosis not present

## 2018-04-07 DIAGNOSIS — H5211 Myopia, right eye: Secondary | ICD-10-CM | POA: Diagnosis not present

## 2018-04-07 DIAGNOSIS — H9319 Tinnitus, unspecified ear: Secondary | ICD-10-CM | POA: Diagnosis not present

## 2018-04-07 DIAGNOSIS — Z961 Presence of intraocular lens: Secondary | ICD-10-CM | POA: Diagnosis not present

## 2018-04-07 DIAGNOSIS — H9313 Tinnitus, bilateral: Secondary | ICD-10-CM | POA: Diagnosis not present

## 2018-04-07 DIAGNOSIS — H903 Sensorineural hearing loss, bilateral: Secondary | ICD-10-CM | POA: Diagnosis not present

## 2018-04-07 DIAGNOSIS — Z7984 Long term (current) use of oral hypoglycemic drugs: Secondary | ICD-10-CM | POA: Diagnosis not present

## 2018-04-07 DIAGNOSIS — H401133 Primary open-angle glaucoma, bilateral, severe stage: Secondary | ICD-10-CM | POA: Diagnosis not present

## 2018-04-07 DIAGNOSIS — E119 Type 2 diabetes mellitus without complications: Secondary | ICD-10-CM | POA: Diagnosis not present

## 2018-04-07 DIAGNOSIS — H524 Presbyopia: Secondary | ICD-10-CM | POA: Diagnosis not present

## 2018-04-07 DIAGNOSIS — H5202 Hypermetropia, left eye: Secondary | ICD-10-CM | POA: Diagnosis not present

## 2018-04-07 DIAGNOSIS — H52223 Regular astigmatism, bilateral: Secondary | ICD-10-CM | POA: Diagnosis not present

## 2018-04-29 DIAGNOSIS — M545 Low back pain: Secondary | ICD-10-CM | POA: Diagnosis not present

## 2018-05-01 DIAGNOSIS — I480 Paroxysmal atrial fibrillation: Secondary | ICD-10-CM | POA: Diagnosis not present

## 2018-05-01 DIAGNOSIS — I10 Essential (primary) hypertension: Secondary | ICD-10-CM | POA: Diagnosis not present

## 2018-05-01 DIAGNOSIS — D649 Anemia, unspecified: Secondary | ICD-10-CM | POA: Diagnosis not present

## 2018-05-01 DIAGNOSIS — E039 Hypothyroidism, unspecified: Secondary | ICD-10-CM | POA: Diagnosis not present

## 2018-05-01 DIAGNOSIS — I2510 Atherosclerotic heart disease of native coronary artery without angina pectoris: Secondary | ICD-10-CM | POA: Diagnosis not present

## 2018-05-01 DIAGNOSIS — N183 Chronic kidney disease, stage 3 (moderate): Secondary | ICD-10-CM | POA: Diagnosis not present

## 2018-05-01 DIAGNOSIS — F322 Major depressive disorder, single episode, severe without psychotic features: Secondary | ICD-10-CM | POA: Diagnosis not present

## 2018-05-01 DIAGNOSIS — E782 Mixed hyperlipidemia: Secondary | ICD-10-CM | POA: Diagnosis not present

## 2018-05-01 DIAGNOSIS — E1122 Type 2 diabetes mellitus with diabetic chronic kidney disease: Secondary | ICD-10-CM | POA: Diagnosis not present

## 2018-05-25 ENCOUNTER — Ambulatory Visit (INDEPENDENT_AMBULATORY_CARE_PROVIDER_SITE_OTHER): Payer: Medicare Other | Admitting: *Deleted

## 2018-05-25 DIAGNOSIS — I495 Sick sinus syndrome: Secondary | ICD-10-CM

## 2018-05-26 NOTE — Progress Notes (Signed)
Remote pacemaker transmission.   

## 2018-06-01 DIAGNOSIS — Z7984 Long term (current) use of oral hypoglycemic drugs: Secondary | ICD-10-CM | POA: Diagnosis not present

## 2018-06-01 DIAGNOSIS — G450 Vertebro-basilar artery syndrome: Secondary | ICD-10-CM | POA: Diagnosis not present

## 2018-06-01 DIAGNOSIS — E1122 Type 2 diabetes mellitus with diabetic chronic kidney disease: Secondary | ICD-10-CM | POA: Diagnosis not present

## 2018-06-01 DIAGNOSIS — N183 Chronic kidney disease, stage 3 (moderate): Secondary | ICD-10-CM | POA: Diagnosis not present

## 2018-06-01 DIAGNOSIS — F322 Major depressive disorder, single episode, severe without psychotic features: Secondary | ICD-10-CM | POA: Diagnosis not present

## 2018-06-01 DIAGNOSIS — I480 Paroxysmal atrial fibrillation: Secondary | ICD-10-CM | POA: Diagnosis not present

## 2018-06-01 DIAGNOSIS — D649 Anemia, unspecified: Secondary | ICD-10-CM | POA: Diagnosis not present

## 2018-06-01 DIAGNOSIS — E039 Hypothyroidism, unspecified: Secondary | ICD-10-CM | POA: Diagnosis not present

## 2018-06-01 DIAGNOSIS — I10 Essential (primary) hypertension: Secondary | ICD-10-CM | POA: Diagnosis not present

## 2018-06-01 DIAGNOSIS — I2510 Atherosclerotic heart disease of native coronary artery without angina pectoris: Secondary | ICD-10-CM | POA: Diagnosis not present

## 2018-06-01 DIAGNOSIS — E782 Mixed hyperlipidemia: Secondary | ICD-10-CM | POA: Diagnosis not present

## 2018-06-05 DIAGNOSIS — H401432 Capsular glaucoma with pseudoexfoliation of lens, bilateral, moderate stage: Secondary | ICD-10-CM | POA: Diagnosis not present

## 2018-06-11 DIAGNOSIS — E1122 Type 2 diabetes mellitus with diabetic chronic kidney disease: Secondary | ICD-10-CM | POA: Diagnosis not present

## 2018-06-11 DIAGNOSIS — G629 Polyneuropathy, unspecified: Secondary | ICD-10-CM | POA: Diagnosis not present

## 2018-06-11 DIAGNOSIS — F329 Major depressive disorder, single episode, unspecified: Secondary | ICD-10-CM | POA: Diagnosis not present

## 2018-06-11 DIAGNOSIS — R413 Other amnesia: Secondary | ICD-10-CM | POA: Diagnosis not present

## 2018-06-11 DIAGNOSIS — I10 Essential (primary) hypertension: Secondary | ICD-10-CM | POA: Diagnosis not present

## 2018-06-11 DIAGNOSIS — K219 Gastro-esophageal reflux disease without esophagitis: Secondary | ICD-10-CM | POA: Diagnosis not present

## 2018-06-11 DIAGNOSIS — N183 Chronic kidney disease, stage 3 (moderate): Secondary | ICD-10-CM | POA: Diagnosis not present

## 2018-06-11 DIAGNOSIS — E039 Hypothyroidism, unspecified: Secondary | ICD-10-CM | POA: Diagnosis not present

## 2018-06-11 DIAGNOSIS — I480 Paroxysmal atrial fibrillation: Secondary | ICD-10-CM | POA: Diagnosis not present

## 2018-06-11 DIAGNOSIS — Z23 Encounter for immunization: Secondary | ICD-10-CM | POA: Diagnosis not present

## 2018-06-19 LAB — CUP PACEART REMOTE DEVICE CHECK
Battery Impedance: 472 Ohm
Battery Remaining Longevity: 102 mo
Brady Statistic AP VP Percent: 0 %
Implantable Lead Location: 753859
Implantable Lead Model: 4470
Implantable Lead Serial Number: 541525
Implantable Pulse Generator Implant Date: 20130410
Lead Channel Pacing Threshold Amplitude: 0.75 V
Lead Channel Pacing Threshold Pulse Width: 0.4 ms
Lead Channel Sensing Intrinsic Amplitude: 2.8 mV
Lead Channel Setting Pacing Amplitude: 2 V
Lead Channel Setting Pacing Amplitude: 2.5 V
Lead Channel Setting Pacing Pulse Width: 0.4 ms
MDC IDC LEAD IMPLANT DT: 20070710
MDC IDC LEAD IMPLANT DT: 20070710
MDC IDC LEAD LOCATION: 753860
MDC IDC LEAD SERIAL: 483166
MDC IDC MSMT BATTERY VOLTAGE: 2.79 V
MDC IDC MSMT LEADCHNL RA IMPEDANCE VALUE: 581 Ohm
MDC IDC MSMT LEADCHNL RA PACING THRESHOLD PULSEWIDTH: 0.4 ms
MDC IDC MSMT LEADCHNL RV IMPEDANCE VALUE: 733 Ohm
MDC IDC MSMT LEADCHNL RV PACING THRESHOLD AMPLITUDE: 0.625 V
MDC IDC MSMT LEADCHNL RV SENSING INTR AMPL: 11.2 mV
MDC IDC SESS DTM: 20190819142020
MDC IDC SET LEADCHNL RV SENSING SENSITIVITY: 5.6 mV
MDC IDC STAT BRADY AP VS PERCENT: 1 %
MDC IDC STAT BRADY AS VP PERCENT: 0 %
MDC IDC STAT BRADY AS VS PERCENT: 99 %

## 2018-07-01 DIAGNOSIS — H401432 Capsular glaucoma with pseudoexfoliation of lens, bilateral, moderate stage: Secondary | ICD-10-CM | POA: Diagnosis not present

## 2018-07-30 DIAGNOSIS — H409 Unspecified glaucoma: Secondary | ICD-10-CM | POA: Diagnosis not present

## 2018-07-30 DIAGNOSIS — I2510 Atherosclerotic heart disease of native coronary artery without angina pectoris: Secondary | ICD-10-CM | POA: Diagnosis not present

## 2018-07-30 DIAGNOSIS — E782 Mixed hyperlipidemia: Secondary | ICD-10-CM | POA: Diagnosis not present

## 2018-07-30 DIAGNOSIS — F322 Major depressive disorder, single episode, severe without psychotic features: Secondary | ICD-10-CM | POA: Diagnosis not present

## 2018-07-30 DIAGNOSIS — N183 Chronic kidney disease, stage 3 (moderate): Secondary | ICD-10-CM | POA: Diagnosis not present

## 2018-07-30 DIAGNOSIS — I480 Paroxysmal atrial fibrillation: Secondary | ICD-10-CM | POA: Diagnosis not present

## 2018-07-30 DIAGNOSIS — E119 Type 2 diabetes mellitus without complications: Secondary | ICD-10-CM | POA: Diagnosis not present

## 2018-07-30 DIAGNOSIS — F329 Major depressive disorder, single episode, unspecified: Secondary | ICD-10-CM | POA: Diagnosis not present

## 2018-07-30 DIAGNOSIS — E1122 Type 2 diabetes mellitus with diabetic chronic kidney disease: Secondary | ICD-10-CM | POA: Diagnosis not present

## 2018-07-30 DIAGNOSIS — D649 Anemia, unspecified: Secondary | ICD-10-CM | POA: Diagnosis not present

## 2018-07-30 DIAGNOSIS — I10 Essential (primary) hypertension: Secondary | ICD-10-CM | POA: Diagnosis not present

## 2018-07-30 DIAGNOSIS — E039 Hypothyroidism, unspecified: Secondary | ICD-10-CM | POA: Diagnosis not present

## 2018-08-13 DIAGNOSIS — R197 Diarrhea, unspecified: Secondary | ICD-10-CM | POA: Diagnosis not present

## 2018-08-13 DIAGNOSIS — K219 Gastro-esophageal reflux disease without esophagitis: Secondary | ICD-10-CM | POA: Diagnosis not present

## 2018-08-21 DIAGNOSIS — D649 Anemia, unspecified: Secondary | ICD-10-CM | POA: Diagnosis not present

## 2018-08-21 DIAGNOSIS — I2510 Atherosclerotic heart disease of native coronary artery without angina pectoris: Secondary | ICD-10-CM | POA: Diagnosis not present

## 2018-08-21 DIAGNOSIS — E1122 Type 2 diabetes mellitus with diabetic chronic kidney disease: Secondary | ICD-10-CM | POA: Diagnosis not present

## 2018-08-21 DIAGNOSIS — I10 Essential (primary) hypertension: Secondary | ICD-10-CM | POA: Diagnosis not present

## 2018-08-21 DIAGNOSIS — G450 Vertebro-basilar artery syndrome: Secondary | ICD-10-CM | POA: Diagnosis not present

## 2018-08-21 DIAGNOSIS — Z7984 Long term (current) use of oral hypoglycemic drugs: Secondary | ICD-10-CM | POA: Diagnosis not present

## 2018-08-21 DIAGNOSIS — E119 Type 2 diabetes mellitus without complications: Secondary | ICD-10-CM | POA: Diagnosis not present

## 2018-08-21 DIAGNOSIS — F329 Major depressive disorder, single episode, unspecified: Secondary | ICD-10-CM | POA: Diagnosis not present

## 2018-08-21 DIAGNOSIS — N183 Chronic kidney disease, stage 3 (moderate): Secondary | ICD-10-CM | POA: Diagnosis not present

## 2018-08-21 DIAGNOSIS — I480 Paroxysmal atrial fibrillation: Secondary | ICD-10-CM | POA: Diagnosis not present

## 2018-08-21 DIAGNOSIS — E782 Mixed hyperlipidemia: Secondary | ICD-10-CM | POA: Diagnosis not present

## 2018-08-21 DIAGNOSIS — E039 Hypothyroidism, unspecified: Secondary | ICD-10-CM | POA: Diagnosis not present

## 2018-08-24 ENCOUNTER — Ambulatory Visit (INDEPENDENT_AMBULATORY_CARE_PROVIDER_SITE_OTHER): Payer: Medicare Other

## 2018-08-24 DIAGNOSIS — I480 Paroxysmal atrial fibrillation: Secondary | ICD-10-CM

## 2018-08-24 DIAGNOSIS — I495 Sick sinus syndrome: Secondary | ICD-10-CM

## 2018-08-25 NOTE — Progress Notes (Signed)
Remote pacemaker transmission.   

## 2018-08-27 ENCOUNTER — Encounter: Payer: Self-pay | Admitting: Cardiology

## 2018-10-14 DIAGNOSIS — E039 Hypothyroidism, unspecified: Secondary | ICD-10-CM | POA: Diagnosis not present

## 2018-10-14 DIAGNOSIS — G450 Vertebro-basilar artery syndrome: Secondary | ICD-10-CM | POA: Diagnosis not present

## 2018-10-14 DIAGNOSIS — I480 Paroxysmal atrial fibrillation: Secondary | ICD-10-CM | POA: Diagnosis not present

## 2018-10-14 DIAGNOSIS — N183 Chronic kidney disease, stage 3 (moderate): Secondary | ICD-10-CM | POA: Diagnosis not present

## 2018-10-14 DIAGNOSIS — F329 Major depressive disorder, single episode, unspecified: Secondary | ICD-10-CM | POA: Diagnosis not present

## 2018-10-14 DIAGNOSIS — Z7984 Long term (current) use of oral hypoglycemic drugs: Secondary | ICD-10-CM | POA: Diagnosis not present

## 2018-10-14 DIAGNOSIS — I10 Essential (primary) hypertension: Secondary | ICD-10-CM | POA: Diagnosis not present

## 2018-10-14 DIAGNOSIS — I2510 Atherosclerotic heart disease of native coronary artery without angina pectoris: Secondary | ICD-10-CM | POA: Diagnosis not present

## 2018-10-14 DIAGNOSIS — G629 Polyneuropathy, unspecified: Secondary | ICD-10-CM | POA: Diagnosis not present

## 2018-10-14 DIAGNOSIS — R413 Other amnesia: Secondary | ICD-10-CM | POA: Diagnosis not present

## 2018-10-14 DIAGNOSIS — K219 Gastro-esophageal reflux disease without esophagitis: Secondary | ICD-10-CM | POA: Diagnosis not present

## 2018-10-14 DIAGNOSIS — E1122 Type 2 diabetes mellitus with diabetic chronic kidney disease: Secondary | ICD-10-CM | POA: Diagnosis not present

## 2018-10-14 DIAGNOSIS — G4733 Obstructive sleep apnea (adult) (pediatric): Secondary | ICD-10-CM | POA: Diagnosis not present

## 2018-10-16 ENCOUNTER — Other Ambulatory Visit: Payer: Self-pay

## 2018-10-16 ENCOUNTER — Emergency Department (HOSPITAL_COMMUNITY)
Admission: EM | Admit: 2018-10-16 | Discharge: 2018-10-16 | Disposition: A | Discharge status: Home / Self Care | Payer: Medicare Other | Attending: Emergency Medicine | Admitting: Emergency Medicine

## 2018-10-16 ENCOUNTER — Emergency Department (HOSPITAL_COMMUNITY): Payer: Medicare Other

## 2018-10-16 DIAGNOSIS — I739 Peripheral vascular disease, unspecified: Secondary | ICD-10-CM | POA: Diagnosis not present

## 2018-10-16 DIAGNOSIS — Z7982 Long term (current) use of aspirin: Secondary | ICD-10-CM | POA: Diagnosis not present

## 2018-10-16 DIAGNOSIS — N183 Chronic kidney disease, stage 3 (moderate): Secondary | ICD-10-CM | POA: Insufficient documentation

## 2018-10-16 DIAGNOSIS — I130 Hypertensive heart and chronic kidney disease with heart failure and stage 1 through stage 4 chronic kidney disease, or unspecified chronic kidney disease: Secondary | ICD-10-CM | POA: Diagnosis not present

## 2018-10-16 DIAGNOSIS — I2510 Atherosclerotic heart disease of native coronary artery without angina pectoris: Secondary | ICD-10-CM | POA: Diagnosis not present

## 2018-10-16 DIAGNOSIS — I10 Essential (primary) hypertension: Secondary | ICD-10-CM

## 2018-10-16 DIAGNOSIS — R42 Dizziness and giddiness: Secondary | ICD-10-CM

## 2018-10-16 DIAGNOSIS — E1122 Type 2 diabetes mellitus with diabetic chronic kidney disease: Secondary | ICD-10-CM | POA: Insufficient documentation

## 2018-10-16 DIAGNOSIS — Z950 Presence of cardiac pacemaker: Secondary | ICD-10-CM | POA: Insufficient documentation

## 2018-10-16 DIAGNOSIS — Z79899 Other long term (current) drug therapy: Secondary | ICD-10-CM | POA: Diagnosis not present

## 2018-10-16 DIAGNOSIS — Z87891 Personal history of nicotine dependence: Secondary | ICD-10-CM | POA: Insufficient documentation

## 2018-10-16 DIAGNOSIS — I5032 Chronic diastolic (congestive) heart failure: Secondary | ICD-10-CM | POA: Diagnosis not present

## 2018-10-16 DIAGNOSIS — E039 Hypothyroidism, unspecified: Secondary | ICD-10-CM | POA: Insufficient documentation

## 2018-10-16 DIAGNOSIS — F0390 Unspecified dementia without behavioral disturbance: Secondary | ICD-10-CM | POA: Diagnosis not present

## 2018-10-16 DIAGNOSIS — H538 Other visual disturbances: Secondary | ICD-10-CM | POA: Diagnosis not present

## 2018-10-16 LAB — CBC WITH DIFFERENTIAL/PLATELET
Abs Immature Granulocytes: 0.02 10*3/uL (ref 0.00–0.07)
BASOS PCT: 1 %
Basophils Absolute: 0 10*3/uL (ref 0.0–0.1)
Eosinophils Absolute: 0.2 10*3/uL (ref 0.0–0.5)
Eosinophils Relative: 4 %
HCT: 43.7 % (ref 36.0–46.0)
Hemoglobin: 14.3 g/dL (ref 12.0–15.0)
Immature Granulocytes: 0 %
Lymphocytes Relative: 43 %
Lymphs Abs: 2.8 10*3/uL (ref 0.7–4.0)
MCH: 29.6 pg (ref 26.0–34.0)
MCHC: 32.7 g/dL (ref 30.0–36.0)
MCV: 90.5 fL (ref 80.0–100.0)
Monocytes Absolute: 0.4 10*3/uL (ref 0.1–1.0)
Monocytes Relative: 5 %
Neutro Abs: 3.1 10*3/uL (ref 1.7–7.7)
Neutrophils Relative %: 47 %
Platelets: 224 10*3/uL (ref 150–400)
RBC: 4.83 MIL/uL (ref 3.87–5.11)
RDW: 12.8 % (ref 11.5–15.5)
WBC: 6.6 10*3/uL (ref 4.0–10.5)
nRBC: 0 % (ref 0.0–0.2)

## 2018-10-16 LAB — COMPREHENSIVE METABOLIC PANEL
ALT: 55 U/L — AB (ref 0–44)
AST: 40 U/L (ref 15–41)
Albumin: 4.4 g/dL (ref 3.5–5.0)
Alkaline Phosphatase: 75 U/L (ref 38–126)
Anion gap: 12 (ref 5–15)
BUN: 19 mg/dL (ref 8–23)
CO2: 23 mmol/L (ref 22–32)
Calcium: 10.5 mg/dL — ABNORMAL HIGH (ref 8.9–10.3)
Chloride: 106 mmol/L (ref 98–111)
Creatinine, Ser: 1.03 mg/dL — ABNORMAL HIGH (ref 0.44–1.00)
GFR calc Af Amer: >60 mL/min (ref >60)
GFR calc non Af Amer: 52 mL/min — ABNORMAL LOW (ref >60)
Glucose, Bld: 81 mg/dL (ref 70–99)
Potassium: 4 mmol/L (ref 3.5–5.1)
Sodium: 141 mmol/L (ref 135–145)
Total Bilirubin: 0.8 mg/dL (ref 0.3–1.2)
Total Protein: 7.1 g/dL (ref 6.5–8.1)

## 2018-10-16 LAB — I-STAT TROPONIN, ED: Troponin i, poc: 0 ng/mL (ref 0.00–0.08)

## 2018-10-16 MED ORDER — HYDRALAZINE HCL 20 MG/ML IJ SOLN
5.0000 mg | Freq: Once | INTRAMUSCULAR | Status: DC
  Filled 2018-10-16: qty 1

## 2018-10-16 MED ORDER — METOPROLOL SUCCINATE ER 100 MG PO TB24: 100.0000 mg | ORAL_TABLET | Freq: Every day | ORAL | 0 refills | Status: DC

## 2018-10-16 MED ORDER — IOPAMIDOL (ISOVUE-370) INJECTION 76%
INTRAVENOUS | Status: AC
  Filled 2018-10-16: qty 100

## 2018-10-16 MED ORDER — IOPAMIDOL (ISOVUE-370) INJECTION 76%
100.0000 mL | Freq: Once | INTRAVENOUS | Status: AC | PRN
  Administered 2018-10-16: 100 mL via INTRAVENOUS

## 2018-10-16 NOTE — ED Triage Notes (Signed)
Pt here with c/o htn sent from MD , pt with c/o h/a and blurred vision

## 2018-10-16 NOTE — ED Notes (Signed)
Patient verbalizes understanding of discharge instructions. Opportunity for questioning and answers were provided. Armband removed by staff, pt discharged from ED by wheelchair   

## 2018-10-16 NOTE — Discharge Instructions (Signed)
It was my pleasure taking care of you today!   I have changed your blood pressure patient dose.  Please start taking the new metoprolol prescription tomorrow.  Please call your primary care doctor on Monday to schedule a follow-up appointment for blood pressure recheck.  Return to ER for new or worsening symptoms, any additional concerns.

## 2018-10-16 NOTE — ED Provider Notes (Signed)
[facility] EMERGENCY DEPARTMENT Provider Note   CSN: [id number] Arrival date & time: 10/16/18  1821     History   Chief Complaint Chief Complaint  Patient presents with  . Hypertension    HPI Heidi [name] is a 78 y.o. female.  The history is provided by the patient and medical records. No language interpreter was used.  Hypertension  Associated symptoms include headaches.   Heidi [name] is a 78 y.o. 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  Patient states that she initially developed an unsteady sensation yesterday.  She checked her blood pressure and noticed it was 180s over 90s.  It has persistently been elevated yesterday and today.  She states her blood pressure is typically in the 120s when she goes to the doctor.  She is on blood pressure medication and denies any missed doses.  BP of 180/92 at clinic today.  She reports associated blurry vision intermittently for the last 2 days.  She states that she closed each eye independently and feels as if her vision is blurry bilaterally.  She does endorse intermittent headaches as well, but states this does not feel similar to her migraines.  She denies any chest pain, shortness of breath, neck pain, back pain, slurred speech, abdominal pain or urinary symptoms.  She did have a urinalysis done at [facility] clinic which was negative for signs of infection.  She is very concerned because her brother actually had a stroke on Monday.   Past Medical History:  Diagnosis Date  . Anxiety   . Atrial fibrillation (HCC)    Intermittent  . CAD (coronary artery disease)    non obstructive  . CKD (chronic kidney disease)   . Diabetes mellitus without complication ([place])   . Dysrhythmia    PAF  . GERD  (gastroesophageal reflux disease)   . Glaucoma    [place]  . HTN (hypertension)   . Hyperlipidemia   . Hypothyroid   . Major depression    10-17-2010: hospitalized for suicidal, [name] (D/C 10-30-2010)  . Migraine headache   . OSA (obstructive sleep apnea)    CPAP nightly  . Peripheral neuropathy   . Seasonal allergies   . Sleep apnea   . Tachy-brady syndrome ([place]) 04-2006   PPM placed    Patient Active Problem List   Diagnosis Date Noted  . HLD (hyperlipidemia) 05/30/2017  . GERD (gastroesophageal reflux disease) 05/30/2017  . UTI (urinary tract infection) 05/30/2017  . Acute metabolic encephalopathy 25/00/3704  . Type II diabetes mellitus with renal manifestations ([place]) 05/30/2017  . Hypothyroidism 05/30/2017  . Depression 05/30/2017  . CAD (coronary artery disease) 05/30/2017  . CKD (chronic kidney disease), stage III ([place]) 05/30/2017  . Chronic diastolic CHF (congestive heart failure) ([place]) 05/30/2017  . Dementia ([place]) 05/30/2017  . Essential hypertension, benign 06/15/2014  . Allergic reaction 04/28/2012  . Tachycardia-bradycardia ([place]) 12/24/2011  . 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 HTN (hypertension) 12/24/2011  . A-fib ([place]) 11/07/2011    Past Surgical History:  Procedure Laterality Date  . ABDOMINAL ADHESION SURGERY  2008   exploratory to remove attached to the abdominal wall, stomach and intestines.   . ABDOMINAL HYSTERECTOMY  1976  . APPENDECTOMY    . BREAST EXCISIONAL BIOPSY    . BREAST SURGERY  fibroids removed, benign  . CARDIAC CATHETERIZATION  02-12-2006   Minor irregularities: RCA-mid 40%, LM-normal, LAD-normal. EF 65%.  . CHOLECYSTECTOMY    . ECTOPIC PREGNANCY SURGERY     treatment x3, last one 11-02-2009 at [facility]  . goiter    . goiter removed    . MASS EXCISION Right 10/18/2015   Procedure: EXCISION OF 6CM MASS ON RIGHT  NECK/BACK;  Surgeon: [name], MD;  Location: [facility];  Service:  General;  Laterality: Right;  . PACEMAKER GENERATOR CHANGE N/A 01/15/2012   Procedure: PACEMAKER GENERATOR CHANGE;  Surgeon: Deboraha Sprang, MD;  Location: [facility] CATH LAB;  Service: Cardiovascular;  Laterality: N/A;  . PACEMAKER INSERTION     Permanent. [employer] EnRhyth 04/2006, set as DDDR  . TUMOR REMOVAL  2000   Fibroid from breast x4-most recent  . WRIST FRACTURE SURGERY  11-2007   right, had metal plate inserted     OB History   No obstetric history on file.      Home Medications    Prior to Admission medications   Medication Sig Start Date End Date Taking? Authorizing Provider  aspirin EC 81 MG tablet Take 81 mg by mouth every evening.  12/24/11   Deboraha Sprang, MD  cetirizine (ZYRTEC) 10 MG tablet Take 10 mg by mouth daily after breakfast.     [provider]  Cholecalciferol (VITAMIN D) 2000 UNITS tablet Take 2,000 Units by mouth daily.      [provider]  donepezil (ARICEPT) 10 MG tablet Take 10 mg by mouth daily after breakfast.  04/22/15   [provider]  fenofibrate (TRICOR) 145 MG tablet Take 145 mg by mouth at bedtime.     [provider]  ferrous sulfate (FERROUSUL) 325 (65 FE) MG tablet Take 325 mg by mouth daily with breakfast.    [provider]  fluticasone (FLONASE) 50 MCG/ACT nasal spray Place 2 sprays into the nose daily as needed for allergies.     [provider]  furosemide (LASIX) 20 MG tablet Take 1 tablet (20 mg total) by mouth 3 (three) times a week. 10/03/17   Deboraha Sprang, MD  gabapentin (NEURONTIN) 300 MG capsule Take 300 mg by mouth 3 (three) times daily.      [provider]  glimepiride (AMARYL) 1 MG tablet Take 1 mg by mouth daily after breakfast.    [provider]  hydrOXYzine (VISTARIL) 25 MG capsule Take 25 mg by mouth at bedtime.     [provider]  levothyroxine (SYNTHROID, LEVOTHROID) 75 MCG tablet Take 75 mcg by mouth daily before breakfast.     [provider]  metoprolol succinate (TOPROL-XL) 100 MG 24 hr tablet Take 1 tablet (100 mg total) by mouth daily. 10/16/18   Sadao Weyer, Ozella Almond, PA-C  nortriptyline (PAMELOR) 75 MG capsule Take 150 mg by mouth at bedtime.     [provider]  omeprazole (PRILOSEC) 40 MG capsule Take 40 mg by mouth daily.     [provider]  ranitidine (ZANTAC) 300 MG capsule Take 300 mg by mouth 2 (two) times daily.     [provider]  spironolactone (ALDACTONE) 25 MG tablet Take 1 tablet (25 mg total) by mouth daily. 10/03/17   Deboraha Sprang, MD  azelastine (ASTELIN) 137 MCG/SPRAY nasal spray Place 2 sprays into the nose 2 (two) times daily as needed. Use in each nostril as directed  12/31/11  [provider]  fluticasone (VERAMYST) 27.5 MCG/SPRAY nasal spray Place 1 spray into the nose 2 (two) times daily as needed.   12/31/11  [provider]    Family History Family History  Problem Relation Age of Onset  . Stomach cancer Father   . Atrial fibrillation Brother     Social History Social History   Tobacco Use  . Smoking status: Former Research scientist (life sciences)  . Smokeless tobacco: Never Used  Substance Use Topics  . Alcohol use: No  . Drug use: No     Allergies   Clopidogrel; Crestor [rosuvastatin calcium]; Effexor [venlafaxine hydrochloride]; Hibiclens [chlorhexidine gluconate]; Lipitor [atorvastatin calcium]; Nardil; Penicillins; Plavix [clopidogrel bisulfate]; Rosuvastatin; Simvastatin; Sulfonamide derivatives; Venlafaxine; Zestril [lisinopril]; Adhesive [tape]; and Atorvastatin   Review of Systems Review of Systems  Eyes: Positive for visual disturbance (Blurred vision).  Neurological: Positive for dizziness and headaches.  All other systems reviewed and are negative.    Physical Exam Updated Vital Signs BP (!) 145/74   Pulse 86   Temp (!) 97.3 F (36.3 C) (Oral)   Resp 12   Ht 5\' 2"  (1.575 m)   Wt 68.5 kg   SpO2 100%   BMI 27.62 kg/m   Physical  Exam Vitals signs and nursing note reviewed.  Constitutional:      General: She is not in acute distress.    Appearance: She is well-developed.  HENT:     Head: Normocephalic and atraumatic.  Cardiovascular:     Rate and Rhythm: Normal rate and regular rhythm.     Heart sounds: Normal heart sounds. No murmur.  Pulmonary:     Effort: Pulmonary effort is normal. No respiratory distress.     Breath sounds: Normal breath sounds.  Abdominal:     General: There is no distension.     Palpations: Abdomen is soft.     Tenderness: There is no abdominal tenderness.  Skin:    General: Skin is warm and dry.  Neurological:     Mental Status: She is alert and oriented to person, place, and time.     Comments: Alert, oriented, thought content appropriate, able to give a coherent history. Speech is clear and goal oriented, able to follow commands.  Cranial Nerves:  II:  Peripheral visual fields grossly normal, pupils equal, round, reactive to light III, IV, VI: EOM intact bilaterally, ptosis not present V,VII: smile symmetric, eyes kept closed tightly against resistance, facial light touch sensation equal VIII: hearing grossly normal IX, X: symmetric soft palate movement, uvula elevates symmetrically  XI: bilateral shoulder shrug symmetric and strong XII: midline tongue extension 5/5 muscle strength in upper and lower extremities bilaterally including strong and equal grip strength and dorsiflexion/plantar flexion Sensory to light touch normal in all four extremities.  Normal finger-to-nose and rapid alternating movements; No pronator drift. Unsteady gait.      ED Treatments / Results  Labs (all labs ordered are listed, but only abnormal results are displayed) Labs Reviewed  COMPREHENSIVE METABOLIC PANEL - Abnormal; Notable for the following components:      Result Value   Creatinine, Ser 1.03 (*)    Calcium 10.5 (*)    ALT 55 (*)    GFR calc non Af Amer 52 (*)    All other components  within normal limits  CBC WITH DIFFERENTIAL/PLATELET  I-STAT TROPONIN, ED    EKG EKG Interpretation  Date/Time:  Friday October 16 2018 19:15:13 EST Ventricular Rate:  88 PR Interval:    QRS Duration: 94 QT Interval:  363  QTC Calculation: 440 R Axis:   -37 Text Interpretation:  Sinus rhythm Abnormal R-wave progression, late transition Left ventricular hypertrophy No significant change since last tracing Confirmed by Wandra Arthurs [phone]) on 10/16/2018 7:20:07 PM   Radiology Ct Angio Head W Or Wo Contrast  Result Date: 10/16/2018 CLINICAL DATA:  Blurred vision and dizziness.  Headache. EXAM: CT ANGIOGRAPHY HEAD AND NECK TECHNIQUE: Multidetector CT imaging of the head and neck was performed using the standard protocol during bolus administration of intravenous contrast. Multiplanar CT image reconstructions and MIPs were obtained to evaluate the vascular anatomy. Carotid stenosis measurements (when applicable) are obtained utilizing NASCET criteria, using the distal internal carotid diameter as the denominator. CONTRAST:  170mL ISOVUE-370 IOPAMIDOL (ISOVUE-370) INJECTION 76% COMPARISON:  CT head without contrast 05/30/2017 FINDINGS: CT HEAD FINDINGS Brain: Atrophy and white matter changes are stable, mildly advanced for age. No acute infarct, hemorrhage, or mass lesion is present. Basal ganglia are intact. Insular ribbon is normal bilaterally. Occipital lobes are unremarkable. Mild atrophy of the cerebellum is stable. 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 The ventricles are of proportionate to the degree of atrophy. No significant extraaxial fluid collection is present. Vascular: Atherosclerotic calcifications are present within the cavernous internal carotid arteries bilaterally. There is no hyperdense vessel. Skull: Calvarium is intact. No focal lytic or blastic lesions are present. Sinuses: The paranasal sinuses and mastoid air cells are clear. Orbits: Bilateral lens  replacements are noted. Globes and orbits are otherwise unremarkable. Review of the MIP images confirms the above findings CTA NECK FINDINGS Aortic arch: A 3 vessel arch configuration is present. Atherosclerotic calcifications are present at the aortic arch and at the origin of the left subclavian artery without significant stenosis or aneurysm. Right carotid system: Right common carotid artery is somewhat tortuous. Dense calcifications are present at the distal right common carotid artery and the bifurcation. The lumen is narrowed to 2.3 mm. There is no significant stenosis relative to the more distal vessel. No additional stenoses are present. Left carotid system: The left common carotid artery is within normal limits. Calcified and noncalcified plaque is present at the bifurcation without a significant stenosis. Vertebral arteries: The left vertebral artery is the dominant vessel. Both vertebral arteries originate from the subclavian arteries without a significant stenosis. There is no significant stenosis of either vertebral artery in the neck. The right vertebral artery is hypoplastic. Skeleton: There straightening of the normal cervical lordosis. Endplate changes are greatest at C5-6 and C6-7. Slight anterolisthesis is present at C4-5. No focal lytic or blastic lesions are present. Other neck: A multinodular goiter is present. No dominant lesions are present. There is no significant cervical adenopathy. Salivary glands are within normal limits. No focal mucosal or submucosal lesions are present. Upper chest: Scattered ground-glass attenuation is present the lung apices bilaterally. Thoracic inlet is within normal limits. Upper mediastinum is unremarkable. Review of the MIP images confirms the above findings CTA HEAD FINDINGS Anterior circulation: Atherosclerotic calcifications are present within the cavernous internal carotid arteries bilaterally without a significant stenosis relative to the more distal  vessels. The A1 and M1 segments are patent. The anterior communicating artery is patent. MCA bifurcations are within normal limits. ACA and MCA branch vessels demonstrate mild distal vessel irregularity without a significant proximal stenosis or occlusion. There is no aneurysm. Posterior circulation: The right vertebral artery is hypoplastic and terminates at the PICA. Left PICA origin is visualized and normal. The left vertebral artery becomes the small basilar  artery. Fetal type posterior cerebral arteries are evident bilaterally. There is some attenuation of distal PCA branch vessels, left greater than right. Venous sinuses: The dural sinuses are patent. Straight sinus deep cerebral veins are intact. Cortical veins are unremarkable. Anatomic variants: Fetal type posterior cerebral arteries bilaterally. Delayed phase: Delayed images accentuate the white matter disease. No pathologic enhancement is present. Review of the MIP images confirms the above findings IMPRESSION: 1. No emergent large vessel occlusion. 2. Mild diffuse distal small vessel disease throughout the anterior and posterior circulation. 3. Atherosclerotic changes at the aortic arch, bilateral carotid bifurcations, and cavernous internal carotid arteries without significant stenosis. Electronically Signed   By: San Morelle M.D.   On: 10/16/2018 20:50   Ct Angio Neck W And/or Wo Contrast  Result Date: 10/16/2018 CLINICAL DATA:  Blurred vision and dizziness.  Headache. EXAM: CT ANGIOGRAPHY HEAD AND NECK TECHNIQUE: Multidetector CT imaging of the head and neck was performed using the standard protocol during bolus administration of intravenous contrast. Multiplanar CT image reconstructions and MIPs were obtained to evaluate the vascular anatomy. Carotid stenosis measurements (when applicable) are obtained utilizing NASCET criteria, using the distal internal carotid diameter as the denominator. CONTRAST:  177mL ISOVUE-370 IOPAMIDOL  (ISOVUE-370) INJECTION 76% COMPARISON:  CT head without contrast 05/30/2017 FINDINGS: CT HEAD FINDINGS Brain: Atrophy and white matter changes are stable, mildly advanced for age. No acute infarct, hemorrhage, or mass lesion is present. Basal ganglia are intact. Insular ribbon is normal bilaterally. Occipital lobes are unremarkable. Mild atrophy of the cerebellum is stable. 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 The ventricles are of proportionate to the degree of atrophy. No significant extraaxial fluid collection is present. Vascular: Atherosclerotic calcifications are present within the cavernous internal carotid arteries bilaterally. There is no hyperdense vessel. Skull: Calvarium is intact. No focal lytic or blastic lesions are present. Sinuses: The paranasal sinuses and mastoid air cells are clear. Orbits: Bilateral lens replacements are noted. Globes and orbits are otherwise unremarkable. Review of the MIP images confirms the above findings CTA NECK FINDINGS Aortic arch: A 3 vessel arch configuration is present. Atherosclerotic calcifications are present at the aortic arch and at the origin of the left subclavian artery without significant stenosis or aneurysm. Right carotid system: Right common carotid artery is somewhat tortuous. Dense calcifications are present at the distal right common carotid artery and the bifurcation. The lumen is narrowed to 2.3 mm. There is no significant stenosis relative to the more distal vessel. No additional stenoses are present. Left carotid system: The left common carotid artery is within normal limits. Calcified and noncalcified plaque is present at the bifurcation without a significant stenosis. Vertebral arteries: The left vertebral artery is the dominant vessel. Both vertebral arteries originate from the subclavian arteries without a significant stenosis. There is no significant stenosis of either vertebral artery in the neck. The right vertebral  artery is hypoplastic. Skeleton: There straightening of the normal cervical lordosis. Endplate changes are greatest at C5-6 and C6-7. Slight anterolisthesis is present at C4-5. No focal lytic or blastic lesions are present. Other neck: A multinodular goiter is present. No dominant lesions are present. There is no significant cervical adenopathy. Salivary glands are within normal limits. No focal mucosal or submucosal lesions are present. Upper chest: Scattered ground-glass attenuation is present the lung apices bilaterally. Thoracic inlet is within normal limits. Upper mediastinum is unremarkable. Review of the MIP images confirms the above findings CTA HEAD FINDINGS Anterior circulation: Atherosclerotic calcifications are present  within the cavernous internal carotid arteries bilaterally without a significant stenosis relative to the more distal vessels. The A1 and M1 segments are patent. The anterior communicating artery is patent. MCA bifurcations are within normal limits. ACA and MCA branch vessels demonstrate mild distal vessel irregularity without a significant proximal stenosis or occlusion. There is no aneurysm. Posterior circulation: The right vertebral artery is hypoplastic and terminates at the PICA. Left PICA origin is visualized and normal. The left vertebral artery becomes the small basilar artery. Fetal type posterior cerebral arteries are evident bilaterally. There is some attenuation of distal PCA branch vessels, left greater than right. Venous sinuses: The dural sinuses are patent. Straight sinus deep cerebral veins are intact. Cortical veins are unremarkable. Anatomic variants: Fetal type posterior cerebral arteries bilaterally. Delayed phase: Delayed images accentuate the white matter disease. No pathologic enhancement is present. Review of the MIP images confirms the above findings IMPRESSION: 1. No emergent large vessel occlusion. 2. Mild diffuse distal small vessel disease throughout the  anterior and posterior circulation. 3. Atherosclerotic changes at the aortic arch, bilateral carotid bifurcations, and cavernous internal carotid arteries without significant stenosis. Electronically Signed   By: San Morelle M.D.   On: 10/16/2018 20:50    Procedures Procedures (including critical care time)  Medications Ordered in ED Medications  iopamidol (ISOVUE-370) 76 % injection (has no administration in time range)  iopamidol (ISOVUE-370) 76 % injection 100 mL (100 mLs Intravenous Contrast Given 10/16/18 1940)     Initial Impression / Assessment and Plan / ED Course  I have reviewed the triage vital signs and the nursing notes.  Pertinent labs & imaging results that were available during my care of the patient were reviewed by me and considered in my medical decision making (see chart for details).    Heidi [name] is a 78 y.o. female who presents to ED for elevated blood pressure readings, headache and dizziness. On exam, she seems a little unsteady on her feet, but otherwise no focal neuro deficits appreciated. BP has been elevated in ED. Did come down to 134-145/66-75 without any intervention. Labs reviewed and reassuring. CT angio of the head and neck without acute findings. Discussed option of MRI for further workup, however she has a pacemaker that is not compatible with this.  On reevaluation, she has ambulated and reports feeling a little better. Acute CVA much less likely. ? Symptomatic hypertension versus migraine.  We will increase her metoprolol and have her follow-up closely with her doctor.  She is in agreement with this plan.  Discussed reasons to return to the ER.  All questions answered.   Patient seen by and discussed with Dr. Darl Householder who agrees with treatment plan.    Final Clinical Impressions(s) / ED Diagnoses   Final diagnoses:  Essential hypertension  Dizziness    ED Discharge Orders         Ordered    metoprolol succinate (TOPROL-XL) 100 MG 24 hr  tablet  Daily    Note to Pharmacy:  DOSE INCREASE   10/16/18 2137           Coree Riester, Ozella Almond, PA-C 10/16/18 2242    Drenda Freeze, MD 10/19/18 [phone]

## 2018-10-19 DIAGNOSIS — H401432 Capsular glaucoma with pseudoexfoliation of lens, bilateral, moderate stage: Secondary | ICD-10-CM | POA: Diagnosis not present

## 2018-10-20 LAB — CUP PACEART REMOTE DEVICE CHECK
Battery Impedance: 521 Ohm
Battery Remaining Longevity: 98 mo
Battery Voltage: 2.79 V
Brady Statistic AP VP Percent: 0 %
Brady Statistic AP VS Percent: 1 %
Brady Statistic AS VP Percent: 0 %
Brady Statistic AS VS Percent: 99 %
Date Time Interrogation Session: 20191118144116
Implantable Lead Implant Date: 20070710
Implantable Lead Location: 753859
Implantable Lead Location: 753860
Implantable Lead Model: 4469
Implantable Lead Model: 4470
Implantable Lead Serial Number: 541525
Implantable Pulse Generator Implant Date: 20130410
Lead Channel Impedance Value: 581 Ohm
Lead Channel Impedance Value: 695 Ohm
Lead Channel Pacing Threshold Amplitude: 0.75 V
Lead Channel Pacing Threshold Pulse Width: 0.4 ms
Lead Channel Pacing Threshold Pulse Width: 0.4 ms
Lead Channel Setting Pacing Amplitude: 2.5 V
Lead Channel Setting Pacing Pulse Width: 0.4 ms
Lead Channel Setting Sensing Sensitivity: 5.6 mV
MDC IDC LEAD IMPLANT DT: 20070710
MDC IDC LEAD SERIAL: 483166
MDC IDC MSMT LEADCHNL RV PACING THRESHOLD AMPLITUDE: 0.625 V
MDC IDC SET LEADCHNL RA PACING AMPLITUDE: 2 V

## 2018-10-21 DIAGNOSIS — I10 Essential (primary) hypertension: Secondary | ICD-10-CM | POA: Diagnosis not present

## 2018-10-22 ENCOUNTER — Other Ambulatory Visit: Payer: Self-pay | Admitting: Internal Medicine

## 2018-10-22 ENCOUNTER — Ambulatory Visit (INDEPENDENT_AMBULATORY_CARE_PROVIDER_SITE_OTHER): Payer: Medicare Other | Admitting: Nurse Practitioner

## 2018-10-22 ENCOUNTER — Encounter: Payer: Self-pay | Admitting: Nurse Practitioner

## 2018-10-22 VITALS — BP 122/72 | HR 96 | Ht 62.0 in | Wt 151.4 lb

## 2018-10-22 DIAGNOSIS — I10 Essential (primary) hypertension: Secondary | ICD-10-CM | POA: Diagnosis not present

## 2018-10-22 DIAGNOSIS — I495 Sick sinus syndrome: Secondary | ICD-10-CM | POA: Diagnosis not present

## 2018-10-22 DIAGNOSIS — I5032 Chronic diastolic (congestive) heart failure: Secondary | ICD-10-CM

## 2018-10-22 DIAGNOSIS — I480 Paroxysmal atrial fibrillation: Secondary | ICD-10-CM

## 2018-10-22 LAB — CUP PACEART INCLINIC DEVICE CHECK
Date Time Interrogation Session: 20200116093924
Implantable Lead Implant Date: 20070710
Implantable Lead Implant Date: 20070710
Implantable Lead Location: 753859
Implantable Lead Location: 753860
Implantable Lead Serial Number: 483166
Implantable Lead Serial Number: 541525
Implantable Pulse Generator Implant Date: 20130410

## 2018-10-22 MED ORDER — METOPROLOL SUCCINATE ER 100 MG PO TB24: 100.0000 mg | ORAL_TABLET | Freq: Every day | ORAL | 4 refills | Status: DC

## 2018-10-22 NOTE — Progress Notes (Signed)
Electrophysiology Office Note Date: 10/22/2018  ID:  Heidi [name], DOB 06/12/1941, MRN [id number]  PCP: Wenda Low, MD Primary Cardiologist: Marlou Porch Electrophysiologist: Caryl Comes  CC: Pacemaker follow-up  Heidi [name] is a 78 y.o. female seen today for Dr Caryl Comes.  She presents today for routine electrophysiology followup.  Since last being seen in our clinic, the patient reports doing reasonably well.  She is having difficulty with labile blood pressures that correspond with her brother having a recent stroke. She is working with Dr Lysle Rubens on med titration.  She denies chest pain, palpitations, dyspnea, PND, orthopnea, nausea, vomiting, dizziness, syncope, edema, weight gain, or early satiety.  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 Anxiety   . Atrial fibrillation (HCC)    Intermittent  . CAD (coronary artery disease)    non obstructive  . CKD (chronic kidney disease)   . Diabetes mellitus without complication ([place])   . Dysrhythmia    PAF  . GERD (gastroesophageal reflux disease)   . Glaucoma    [place]  . HTN (hypertension)   . Hyperlipidemia   . Hypothyroid   . Major depression    10-17-2010: hospitalized for suicidal, [name] (D/C 10-30-2010)  . Migraine headache   . OSA (obstructive sleep apnea)    CPAP nightly  . Peripheral neuropathy   . Seasonal allergies   . Sleep apnea   . 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 ABDOMINAL ADHESION SURGERY  2008   exploratory to remove attached to the abdominal wall, stomach and intestines.   . ABDOMINAL HYSTERECTOMY  1976  . APPENDECTOMY    . BREAST EXCISIONAL BIOPSY    . BREAST SURGERY     fibroids removed, benign  . CARDIAC CATHETERIZATION  02-12-2006   Minor irregularities: RCA-mid 40%, LM-normal, LAD-normal. EF 65%.  . CHOLECYSTECTOMY    .  ECTOPIC PREGNANCY SURGERY     treatment x3, last one 11-02-2009 at [facility]  . goiter    . goiter removed    . MASS EXCISION Right 10/18/2015   Procedure: EXCISION OF 6CM MASS ON RIGHT  NECK/BACK;  Surgeon: [name], MD;  Location: [facility];  Service: General;  Laterality: Right;  . PACEMAKER GENERATOR CHANGE N/A 01/15/2012   Procedure: PACEMAKER GENERATOR CHANGE;  Surgeon: Deboraha Sprang, MD;  Location: [facility] CATH LAB;  Service: Cardiovascular;  Laterality: N/A;  . PACEMAKER INSERTION     Permanent. Medtronic EnRhyth 04/2006, set as DDDR  . TUMOR REMOVAL  2000   Fibroid from breast x4-most recent  . WRIST FRACTURE SURGERY  11-2007   right, had metal plate inserted    Current Outpatient Medications  Medication Sig Dispense Refill  . amLODipine (NORVASC) 10 MG tablet Take 10 mg by mouth daily.    Marland Kitchen aspirin EC 81 MG tablet Take 81 mg by mouth every evening.     . cetirizine (ZYRTEC) 10 MG tablet Take 10 mg by mouth daily after breakfast.     . Cholecalciferol (VITAMIN D) 2000 UNITS tablet Take 2,000 Units by mouth daily.      . cholestyramine (QUESTRAN) 4 g packet Take 4 g by mouth daily.  6  . famotidine (PEPCID) 20 MG tablet Take 1 tablet by mouth 2 (two) times daily.  1  . fenofibrate (TRICOR) 145 MG  tablet Take 145 mg by mouth at bedtime.     . ferrous sulfate (FERROUSUL) 325 (65 FE) MG tablet Take 325 mg by mouth daily with breakfast.    . fluticasone (FLONASE) 50 MCG/ACT nasal spray Place 2 sprays into the nose daily as needed for allergies.     . furosemide (LASIX) 20 MG tablet Take 1 tablet (20 mg total) by mouth 3 (three) times a week. 15 tablet 10  . gabapentin (NEURONTIN) 300 MG capsule Take 300 mg by mouth 3 (three) times daily.      Marland Kitchen galantamine (RAZADYNE ER) 8 MG 24 hr capsule Take 1 capsule by mouth daily.  2  . glimepiride (AMARYL) 1 MG tablet Take 1 mg by mouth daily after breakfast.    . hydrOXYzine (VISTARIL) 25 MG capsule Take 25 mg by mouth at  bedtime.     Marland Kitchen levothyroxine (SYNTHROID, LEVOTHROID) 75 MCG tablet Take 75 mcg by mouth daily before breakfast.     . metFORMIN (GLUCOPHAGE) 500 MG tablet Take 1 tablet by mouth 2 (two) times daily.  12  . metoprolol succinate (TOPROL-XL) 100 MG 24 hr tablet Take 1 tablet (100 mg total) by mouth daily. 30 tablet 4  . nortriptyline (PAMELOR) 75 MG capsule Take 150 mg by mouth at bedtime.     . pantoprazole (PROTONIX) 40 MG tablet Take 1 tablet by mouth daily.    Marland Kitchen spironolactone (ALDACTONE) 25 MG tablet Take 1 tablet (25 mg total) by mouth daily. 30 tablet 11   No current facility-administered medications for this visit.     Allergies:   Clopidogrel; Crestor [rosuvastatin calcium]; Effexor [venlafaxine hydrochloride]; Hibiclens [chlorhexidine gluconate]; Lipitor [atorvastatin calcium]; Nardil; Penicillins; Plavix [clopidogrel bisulfate]; Rosuvastatin; Simvastatin; Sulfonamide derivatives; Venlafaxine; Zestril [lisinopril]; Adhesive [tape]; and Atorvastatin   Social History: Social History   Socioeconomic History  . Marital status: Widowed    Spouse name: Not on file  . Number of children: Not on file  . Years of education: Not on file  . Highest education level: Not on file  Occupational History  . Not on file  Social Needs  . Financial resource strain: Not on file  . Food insecurity:    Worry: Not on file    Inability: Not on file  . Transportation needs:    Medical: Not on file    Non-medical: Not on file  Tobacco Use  . Smoking status: Former Research scientist (life sciences)  . Smokeless tobacco: Never Used  Substance and Sexual Activity  . Alcohol use: No  . Drug use: No  . Sexual activity: Never  Lifestyle  . Physical activity:    Days per week: Not on file    Minutes per session: Not on file  . Stress: Not on file  Relationships  . Social connections:    Talks on phone: Not on file    Gets together: Not on file    Attends religious service: Not on file    Active member of club or  organization: Not on file    Attends meetings of clubs or organizations: Not on file    Relationship status: Not on file  . Intimate partner violence:    Fear of current or ex partner: Not on file    Emotionally abused: Not on file    Physically abused: Not on file    Forced sexual activity: Not on file  Other Topics Concern  . Not on file  Social History Narrative  . Not on file    Family  History: Family History  Problem Relation Age of Onset  . Stomach cancer Father   . Atrial fibrillation Brother      Review of Systems: All other systems reviewed and are otherwise negative except as noted above.   Physical Exam: VS:  BP 122/72   Pulse 96   Ht 5\' 2"  (1.575 m)   Wt 151 lb 6.4 oz (68.7 kg)   SpO2 98%   BMI 27.69 kg/m  , BMI Body mass index is 27.69 kg/m.  GEN- The patient is elderly appearing, alert and oriented x 3 today.   HEENT: normocephalic, atraumatic; sclera clear, conjunctiva pink; hearing intact; oropharynx clear; neck supple  Lungs- Clear to ausculation bilaterally, normal work of breathing.  No wheezes, rales, rhonchi Heart- Regular rate and rhythm, no murmurs, rubs or gallops  GI- soft, non-tender, non-distended, bowel sounds present  Extremities- no clubbing, cyanosis, +BLE edema MS- no significant deformity or atrophy Skin- warm and dry, no rash or lesion; PPM pocket well healed Psych- euthymic mood, full affect Neuro- strength and sensation are intact  PPM Interrogation- reviewed in detail today,  See PACEART report  EKG:  EKG is not ordered today.  Recent Labs: 10/16/2018: ALT 55; BUN 19; Creatinine, Ser 1.03; Hemoglobin 14.3; Platelets 224; Potassium 4.0; Sodium 141   Wt Readings from Last 3 Encounters:  10/22/18 151 lb 6.4 oz (68.7 kg)  10/16/18 151 lb (68.5 kg)  02/23/18 145 lb 12.8 oz (66.1 kg)     Other studies Reviewed: Additional studies/ records that were reviewed today include: Dr Caryl Comes and Dr Kingsley Plan office notes   Assessment and  Plan:  1.  Symptomatic bradycardia Normal PPM function See Pace Art report No changes today  2.  Paroxysmal atrial fibrillation Burden by device interrogation 0% V rates controlled CHADS2VASC is 5 - we discussed [place] again today. She declines for now  3.  HTN Being managed by PCP   4.  Chronic diastolic heart failure Stable No change required today   Current medicines are reviewed at length with the patient today.   The patient does not have concerns regarding her medicines.  The following changes were made today:  none  Labs/ tests ordered today include: none Orders Placed This Encounter  Procedures  . CUP PACEART INCLINIC DEVICE CHECK     Disposition:   Follow up with [facility], Dr Marlou Porch as scheduled, Dr Caryl Comes 1 year     Signed, Chanetta Marshall, NP 10/22/2018 10:05 AM  [place] [address] [phone] (office) [phone] (fax)

## 2018-10-22 NOTE — Patient Instructions (Signed)
Medication Instructions:  none If you need a refill on your cardiac medications before your next appointment, please call your pharmacy.   Lab work: none If you have labs (blood work) drawn today and your tests are completely normal, you will receive your results only by: Marland Kitchen MyChart Message (if you have MyChart) OR . A paper copy in the mail If you have any lab test that is abnormal or we need to change your treatment, we will call you to review the results.  Testing/Procedures: none  Follow-Up: At [facility], you and your health needs are our priority.  As part of our continuing mission to provide you with exceptional heart care, we have created designated Provider Care Teams.  These Care Teams include your primary Cardiologist (physician) and Advanced Practice Providers (APPs -  Physician Assistants and Nurse Practitioners) who all work together to provide you with the care you need, when you need it. You will need a follow up appointment in 1 years.  Please call our office 2 months in advance to schedule this appointment.  You may see Dr Caryl Comes or one of the following Advanced Practice Providers on your designated Care Team:   Chanetta Marshall, NP . Tommye Standard, PA-C  Any Other Special Instructions Will Be Listed Below (If Applicable). Remote monitoring is used to monitor your Pacemaker  from home. This monitoring reduces the number of office visits required to check your device to one time per year. It allows [place] to keep an eye on the functioning of your device to ensure it is working properly. You are scheduled for a device check from home on 11/23/2018. You may send your transmission at any time that day. If you have a wireless device, the transmission will be sent automatically. After your physician reviews your transmission, you will receive a postcard with your next transmission date.

## 2018-11-23 ENCOUNTER — Ambulatory Visit (INDEPENDENT_AMBULATORY_CARE_PROVIDER_SITE_OTHER): Payer: Medicare Other

## 2018-11-23 DIAGNOSIS — I495 Sick sinus syndrome: Secondary | ICD-10-CM

## 2018-11-23 LAB — CUP PACEART REMOTE DEVICE CHECK
Battery Impedance: 572 Ohm
Battery Voltage: 2.78 V
Brady Statistic AP VS Percent: 3 %
Brady Statistic AS VS Percent: 97 %
Date Time Interrogation Session: 20200217143446
Implantable Lead Implant Date: 20070710
Implantable Lead Location: 753859
Implantable Lead Location: 753860
Implantable Lead Model: 4469
Implantable Lead Model: 4470
Implantable Lead Serial Number: 483166
Implantable Lead Serial Number: 541525
Implantable Pulse Generator Implant Date: 20130410
Lead Channel Impedance Value: 710 Ohm
Lead Channel Pacing Threshold Amplitude: 0.625 V
Lead Channel Pacing Threshold Amplitude: 0.75 V
Lead Channel Pacing Threshold Pulse Width: 0.4 ms
Lead Channel Pacing Threshold Pulse Width: 0.4 ms
Lead Channel Setting Pacing Amplitude: 2 V
Lead Channel Setting Pacing Amplitude: 2.5 V
Lead Channel Setting Pacing Pulse Width: 0.4 ms
Lead Channel Setting Sensing Sensitivity: 5.6 mV
MDC IDC LEAD IMPLANT DT: 20070710
MDC IDC MSMT BATTERY REMAINING LONGEVITY: 94 mo
MDC IDC MSMT LEADCHNL RA IMPEDANCE VALUE: 573 Ohm
MDC IDC STAT BRADY AP VP PERCENT: 0 %
MDC IDC STAT BRADY AS VP PERCENT: 0 %

## 2018-11-24 DIAGNOSIS — R609 Edema, unspecified: Secondary | ICD-10-CM | POA: Diagnosis not present

## 2018-11-24 DIAGNOSIS — I10 Essential (primary) hypertension: Secondary | ICD-10-CM | POA: Diagnosis not present

## 2018-12-02 NOTE — Progress Notes (Signed)
Remote pacemaker transmission.   

## 2018-12-29 ENCOUNTER — Other Ambulatory Visit: Payer: Self-pay | Admitting: Internal Medicine

## 2019-01-12 DIAGNOSIS — N183 Chronic kidney disease, stage 3 (moderate): Secondary | ICD-10-CM | POA: Diagnosis not present

## 2019-01-12 DIAGNOSIS — E039 Hypothyroidism, unspecified: Secondary | ICD-10-CM | POA: Diagnosis not present

## 2019-01-12 DIAGNOSIS — G450 Vertebro-basilar artery syndrome: Secondary | ICD-10-CM | POA: Diagnosis not present

## 2019-01-12 DIAGNOSIS — E119 Type 2 diabetes mellitus without complications: Secondary | ICD-10-CM | POA: Diagnosis not present

## 2019-01-12 DIAGNOSIS — E782 Mixed hyperlipidemia: Secondary | ICD-10-CM | POA: Diagnosis not present

## 2019-01-12 DIAGNOSIS — Z7984 Long term (current) use of oral hypoglycemic drugs: Secondary | ICD-10-CM | POA: Diagnosis not present

## 2019-01-12 DIAGNOSIS — D649 Anemia, unspecified: Secondary | ICD-10-CM | POA: Diagnosis not present

## 2019-01-12 DIAGNOSIS — I5032 Chronic diastolic (congestive) heart failure: Secondary | ICD-10-CM | POA: Diagnosis not present

## 2019-01-12 DIAGNOSIS — F322 Major depressive disorder, single episode, severe without psychotic features: Secondary | ICD-10-CM | POA: Diagnosis not present

## 2019-01-12 DIAGNOSIS — H409 Unspecified glaucoma: Secondary | ICD-10-CM | POA: Diagnosis not present

## 2019-01-12 DIAGNOSIS — I2510 Atherosclerotic heart disease of native coronary artery without angina pectoris: Secondary | ICD-10-CM | POA: Diagnosis not present

## 2019-01-12 DIAGNOSIS — I10 Essential (primary) hypertension: Secondary | ICD-10-CM | POA: Diagnosis not present

## 2019-02-06 ENCOUNTER — Other Ambulatory Visit: Payer: Self-pay | Admitting: Internal Medicine

## 2019-02-12 DIAGNOSIS — H401432 Capsular glaucoma with pseudoexfoliation of lens, bilateral, moderate stage: Secondary | ICD-10-CM | POA: Diagnosis not present

## 2019-02-22 ENCOUNTER — Ambulatory Visit (INDEPENDENT_AMBULATORY_CARE_PROVIDER_SITE_OTHER): Payer: Medicare Other | Admitting: *Deleted

## 2019-02-22 ENCOUNTER — Other Ambulatory Visit: Payer: Self-pay

## 2019-02-22 DIAGNOSIS — I495 Sick sinus syndrome: Secondary | ICD-10-CM | POA: Diagnosis not present

## 2019-02-23 LAB — CUP PACEART REMOTE DEVICE CHECK
Battery Impedance: 645 Ohm
Battery Remaining Longevity: 89 mo
Battery Voltage: 2.79 V
Brady Statistic AP VP Percent: 1 %
Brady Statistic AP VS Percent: 5 %
Brady Statistic AS VP Percent: 0 %
Brady Statistic AS VS Percent: 94 %
Date Time Interrogation Session: 20200518125532
Implantable Lead Implant Date: 20070710
Implantable Lead Implant Date: 20070710
Implantable Lead Location: 753859
Implantable Lead Location: 753860
Implantable Lead Model: 4469
Implantable Lead Model: 4470
Implantable Lead Serial Number: 483166
Implantable Lead Serial Number: 541525
Implantable Pulse Generator Implant Date: 20130410
Lead Channel Impedance Value: 564 Ohm
Lead Channel Impedance Value: 692 Ohm
Lead Channel Pacing Threshold Amplitude: 0.625 V
Lead Channel Pacing Threshold Amplitude: 0.75 V
Lead Channel Pacing Threshold Pulse Width: 0.4 ms
Lead Channel Pacing Threshold Pulse Width: 0.4 ms
Lead Channel Setting Pacing Amplitude: 2 V
Lead Channel Setting Pacing Amplitude: 2.5 V
Lead Channel Setting Pacing Pulse Width: 0.4 ms
Lead Channel Setting Sensing Sensitivity: 4 mV

## 2019-02-25 ENCOUNTER — Telehealth: Payer: Self-pay

## 2019-02-25 NOTE — Telephone Encounter (Signed)
YOUR CARDIOLOGY TEAM HAS ARRANGED FOR AN E-VISIT FOR YOUR APPOINTMENT - PLEASE REVIEW IMPORTANT INFORMATION BELOW SEVERAL DAYS PRIOR TO YOUR APPOINTMENT ° °Due to the recent COVID-19 pandemic, we are transitioning in-person office visits to tele-medicine visits in an effort to decrease unnecessary exposure to our patients, their families, and staff. These visits are billed to your insurance just like a normal visit is. We also encourage you to sign up for MyChart if you have not already done so. You will need a smartphone if possible. For patients that do not have this, we can still complete the visit using a regular telephone but do prefer a smartphone to enable video when possible. You may have a family member that lives with you that can help. If possible, we also ask that you have a blood pressure cuff and scale at home to measure your blood pressure, heart rate and weight prior to your scheduled appointment. Patients with clinical needs that need an in-person evaluation and testing will still be able to come to the office if absolutely necessary. If you have any questions, feel free to call our office. ° ° ° ° °YOUR PROVIDER WILL BE USING THE FOLLOWING PLATFORM TO COMPLETE YOUR VISIT: Phone Call ° °• IF USING MYCHART - How to Download the MyChart App to Your SmartPhone  ° °- If Apple, go to App Store and type in MyChart in the search bar and download the app. If Android, ask patient to go to Google Play Store and type in MyChart in the search bar and download the app. The app is free but as with any other app downloads, your phone may require you to verify saved payment information or Apple/Android password.  °- You will need to then log into the app with your MyChart username and password, and select [facility] as your healthcare provider to link the account.  °- When it is time for your visit, go to the MyChart app, find appointments, and click Begin Video Visit. Be sure to Select Allow for your device to  access the Microphone and Camera for your visit. You will then be connected, and your provider will be with you shortly. ° **If you have any issues connecting or need assistance, please contact MyChart service desk [phone] ([phone])** ° **If using a computer, in order to ensure the best quality for your visit, you will need to use either of the following Internet Browsers: Google Chrome or Microsoft Edge** ° °• IF USING DOXIMITY or [email or web address] - The staff will give you instructions on receiving your link to join the meeting the day of your visit.  ° ° ° ° °2-3 DAYS BEFORE YOUR APPOINTMENT ° °You will receive a telephone call from one of our [facility] team members - your caller ID may say "Unknown caller." If this is a video visit, we will walk you through how to get the video launched on your phone. We will remind you check your blood pressure, heart rate and weight prior to your scheduled appointment. If you have an Apple Watch or Kardia, please upload any pertinent ECG strips the day before or morning of your appointment to MyChart. Our staff will also make sure you have reviewed the consent and agree to move forward with your scheduled tele-health visit.  ° ° ° °THE DAY OF YOUR APPOINTMENT ° °Approximately 15 minutes prior to your scheduled appointment, you will receive a telephone call from one of Heart[facility] team - your caller ID may say "Unknown caller."    Our staff will confirm medications, vital signs for the day and any symptoms you may be experiencing. Please have this information available prior to the time of visit start. It may also be helpful for you to have a pad of paper and pen handy for any instructions given during your visit. They will also walk you through joining the smartphone meeting if this is a video visit.    CONSENT FOR TELE-HEALTH VISIT - PLEASE REVIEW  I hereby voluntarily request, consent and authorize [facility] and its employed or contracted physicians, physician  assistants, nurse practitioners or other licensed health care professionals (the Practitioner), to provide me with telemedicine health care services (the "Services") as deemed necessary by the treating Practitioner. I acknowledge and consent to receive the Services by the Practitioner via telemedicine. I understand that the telemedicine visit will involve communicating with the Practitioner through live audiovisual communication technology and the disclosure of certain medical information by electronic transmission. I acknowledge that I have been given the opportunity to request an in-person assessment or other available alternative prior to the telemedicine visit and am voluntarily participating in the telemedicine visit.  I understand that I have the right to withhold or withdraw my consent to the use of telemedicine in the course of my care at any time, without affecting my right to future care or treatment, and that the Practitioner or I may terminate the telemedicine visit at any time. I understand that I have the right to inspect all information obtained and/or recorded in the course of the telemedicine visit and may receive copies of available information for a reasonable fee.  I understand that some of the potential risks of receiving the Services via telemedicine include:  . Delay or interruption in medical evaluation due to technological equipment failure or disruption; . Information transmitted may not be sufficient (e.g. poor resolution of images) to allow for appropriate medical decision making by the Practitioner; and/or  . In rare instances, security protocols could fail, causing a breach of personal health information.  Furthermore, I acknowledge that it is my responsibility to provide information about my medical history, conditions and care that is complete and accurate to the best of my ability. I acknowledge that Practitioner's advice, recommendations, and/or decision may be based on  factors not within their control, such as incomplete or inaccurate data provided by me or distortions of diagnostic images or specimens that may result from electronic transmissions. I understand that the practice of medicine is not an exact science and that Practitioner makes no warranties or guarantees regarding treatment outcomes. I acknowledge that I will receive a copy of this consent concurrently upon execution via email to the email address I last provided but may also request a printed copy by calling the office of [facility].    I understand that my insurance will be billed for this visit.   I have read or had this consent read to me. . I understand the contents of this consent, which adequately explains the benefits and risks of the Services being provided via telemedicine.  . I have been provided ample opportunity to ask questions regarding this consent and the Services and have had my questions answered to my satisfaction. . I give my informed consent for the services to be provided through the use of telemedicine in my medical care  By participating in this telemedicine visit I agree to the above.  

## 2019-03-02 ENCOUNTER — Encounter: Payer: Self-pay | Admitting: Cardiology

## 2019-03-02 NOTE — Progress Notes (Signed)
Remote pacemaker transmission.   

## 2019-03-03 ENCOUNTER — Telehealth (INDEPENDENT_AMBULATORY_CARE_PROVIDER_SITE_OTHER): Payer: Medicare Other | Admitting: Cardiology

## 2019-03-03 ENCOUNTER — Encounter: Payer: Self-pay | Admitting: Cardiology

## 2019-03-03 VITALS — BP 150/90 | HR 96 | Ht 62.0 in | Wt 149.6 lb

## 2019-03-03 DIAGNOSIS — I495 Sick sinus syndrome: Secondary | ICD-10-CM

## 2019-03-03 DIAGNOSIS — I480 Paroxysmal atrial fibrillation: Secondary | ICD-10-CM

## 2019-03-03 DIAGNOSIS — Z950 Presence of cardiac pacemaker: Secondary | ICD-10-CM

## 2019-03-03 DIAGNOSIS — R079 Chest pain, unspecified: Secondary | ICD-10-CM

## 2019-03-03 NOTE — Progress Notes (Signed)
Virtual Visit via Video Note   This visit type was conducted due to national recommendations for restrictions regarding the COVID-19 Pandemic (e.g. social distancing) in an effort to limit this patient's exposure and mitigate transmission in our community.  Due to her co-morbid illnesses, this patient is at least at moderate risk for complications without adequate follow up.  This format is felt to be most appropriate for this patient at this time.  All issues noted in this document were discussed and addressed.  A limited physical exam was performed with this format.  Please refer to the patient's chart for her consent to telehealth for [facility].   Date:  03/03/2019   ID:  Heidi [name], DOB 28-Jun-1941, MRN [id number]  Patient Location: Home Provider Location: Home  PCP:  Wenda Low, MD  Cardiologist:  Candee Furbish, MD  Electrophysiologist:  None   Evaluation Performed:  Follow-Up Visit  Chief Complaint: Follow-up atrial fibrillation  History of Present Illness:    Heidi [name] is a 78 y.o. 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  CHADSVASc 3.  Prior heart catheterization 2007 showed minor irregularities with mid RCA 40%.  EF normal.  Diarrhea improved with medication.  Few skips few seconds.   Back pain fleeting between the shoulder blades at night quite intense.  Only one episode.  Enjoys going to the beach with her [employer] women.  Still can have some pedal edema in the mornings.  Take spironolactone.  Can feel fatigue at times.  The patient does not have symptoms concerning for COVID-19 infection (fever, chills, cough, or new shortness of breath).    Past Medical History:  Diagnosis Date  . Anxiety   . Atrial fibrillation (HCC)    Intermittent  . CAD (coronary artery disease)    non obstructive  . CKD (chronic kidney  disease)   . Diabetes mellitus without complication ([place])   . Dysrhythmia    PAF  . GERD (gastroesophageal reflux disease)   . Glaucoma    [place]  . HTN (hypertension)   . Hyperlipidemia   . Hypothyroid   . Major depression    10-17-2010: hospitalized for suicidal, [name] (D/C 10-30-2010)  . Migraine headache   . OSA (obstructive sleep apnea)    CPAP nightly  . Peripheral neuropathy   . Seasonal allergies   . Sleep apnea   . Tachy-brady syndrome ([place]) 04-2006   PPM placed   Past Surgical History:  Procedure Laterality Date  . ABDOMINAL ADHESION SURGERY  2008   exploratory to remove attached to the abdominal wall, stomach and intestines.   . ABDOMINAL HYSTERECTOMY  1976  . APPENDECTOMY    . BREAST EXCISIONAL BIOPSY    . BREAST SURGERY     fibroids removed, benign  . CARDIAC CATHETERIZATION  02-12-2006   Minor irregularities: RCA-mid 40%, LM-normal, LAD-normal. EF 65%.  . CHOLECYSTECTOMY    . ECTOPIC PREGNANCY SURGERY     treatment x3, last one 11-02-2009 at [facility]  . goiter    . goiter removed    . MASS EXCISION Right 10/18/2015   Procedure: EXCISION OF 6CM MASS ON RIGHT  NECK/BACK;  Surgeon: [name], MD;  Location: [place];  Service: General;  Laterality: Right;  . PACEMAKER GENERATOR CHANGE N/A 01/15/2012   Procedure: PACEMAKER GENERATOR CHANGE;  Surgeon: Deboraha Sprang, MD;  Location: [facility]  CATH LAB;  Service: Cardiovascular;  Laterality: N/A;  . PACEMAKER INSERTION     Permanent. Medtronic EnRhyth 04/2006, set as DDDR  . TUMOR REMOVAL  2000   Fibroid from breast x4-most recent  . WRIST FRACTURE SURGERY  11-2007   right, had metal plate inserted     Current Meds  Medication Sig  . amLODipine (NORVASC) 10 MG tablet Take 10 mg by mouth daily.  Marland Kitchen amLODipine (NORVASC) 5 MG tablet Take 5 mg by mouth daily.  Marland Kitchen aspirin EC 81 MG tablet Take 81 mg by mouth every evening.   . cetirizine (ZYRTEC) 10 MG tablet Take 10  mg by mouth daily after breakfast.   . Cholecalciferol (VITAMIN D) 2000 UNITS tablet Take 5,000 Units by mouth daily.   . cholestyramine (QUESTRAN) 4 g packet Take 4 g by mouth daily.  . fenofibrate 160 MG tablet Take 160 mg by mouth daily.  . ferrous sulfate (FERROUSUL) 325 (65 FE) MG tablet Take 325 mg by mouth daily with breakfast.  . fluticasone (FLONASE) 50 MCG/ACT nasal spray Place 2 sprays into the nose daily as needed for allergies.   . furosemide (LASIX) 20 MG tablet TAKE 1 TABLET(20 MG) BY MOUTH 3 TIMES A WEEK  . gabapentin (NEURONTIN) 300 MG capsule Take 300 mg by mouth 3 (three) times daily.    Marland Kitchen galantamine (RAZADYNE ER) 8 MG 24 hr capsule Take 1 capsule by mouth daily.  Marland Kitchen glimepiride (AMARYL) 1 MG tablet Take 1 mg by mouth daily after breakfast.  . hydrOXYzine (VISTARIL) 25 MG capsule Take 25 mg by mouth at bedtime.   Marland Kitchen levothyroxine (SYNTHROID, LEVOTHROID) 75 MCG tablet Take 75 mcg by mouth daily before breakfast.   . metFORMIN (GLUCOPHAGE) 500 MG tablet Take 1 tablet by mouth 2 (two) times daily.  . metoprolol succinate (TOPROL-XL) 100 MG 24 hr tablet TAKE 1 TABLET(100 MG) BY MOUTH DAILY  . nortriptyline (PAMELOR) 75 MG capsule Take 150 mg by mouth at bedtime.   . pantoprazole (PROTONIX) 40 MG tablet Take 80 mg by mouth daily.   Marland Kitchen spironolactone (ALDACTONE) 25 MG tablet Take 1 tablet (25 mg total) by mouth daily.     Allergies:   Clopidogrel; Crestor [rosuvastatin calcium]; Effexor [venlafaxine hydrochloride]; Hibiclens [chlorhexidine gluconate]; Lipitor [atorvastatin calcium]; Nardil; Penicillins; Plavix [clopidogrel bisulfate]; Rosuvastatin; Simvastatin; Sulfonamide derivatives; Venlafaxine; Zestril [lisinopril]; Adhesive [tape]; and Atorvastatin   Social History   Tobacco Use  . Smoking status: Former Research scientist (life sciences)  . Smokeless tobacco: Never Used  Substance Use Topics  . Alcohol use: No  . Drug use: No     Family Hx: The patient's family history includes Atrial  fibrillation in her brother; Stomach cancer in her father.  ROS:   Please see the history of present illness.    No fevers chills nausea vomiting syncope bleeding All other systems reviewed and are negative.   Prior CV studies:   The following studies were reviewed today:  Stress test low risk 2018  Labs/Other Tests and Data Reviewed:    EKG:  An ECG dated 10/16/2018 was personally reviewed today and demonstrated:  Sinus rhythm left axis deviation  Recent Labs: 10/16/2018: ALT 55; BUN 19; Creatinine, Ser 1.03; Hemoglobin 14.3; Platelets 224; Potassium 4.0; Sodium 141   Recent Lipid Panel Lab Results  Component Value Date/Time   CHOL 188 04/02/2011 09:05 AM   TRIG 85.0 04/02/2011 09:05 AM   HDL 58.40 04/02/2011 09:05 AM   CHOLHDL 3 04/02/2011 09:05 AM   LDLCALC 113 (H)  04/02/2011 09:05 AM    Wt Readings from Last 3 Encounters:  03/03/19 149 lb 9.6 oz (67.9 kg)  10/22/18 151 lb 6.4 oz (68.7 kg)  10/16/18 151 lb (68.5 kg)     Objective:    Vital Signs:  BP (!) 150/90   Pulse 96   Ht 5\' 2"  (1.575 m)   Wt 149 lb 9.6 oz (67.9 kg)   BMI 27.36 kg/m    VITAL SIGNS:  reviewed GEN:  no acute distress EYES:  sclerae anicteric, EOMI - Extraocular Movements Intact RESPIRATORY:  normal respiratory effort, symmetric expansion SKIN:  no rash, lesions or ulcers. MUSCULOSKELETAL:  no obvious deformities. NEURO:  alert and oriented x 3, no obvious focal deficit PSYCH:  normal affect  ASSESSMENT & PLAN:    Paroxysmal atrial fibrillation - Score-3.  In the past we have talked about anticoagulation.  She does not wish to use this.  Overall very few or very rare episodes.  She will let us know if this changes.  Pacemaker burden can be checked.  Pacemaker for tachybradycardia syndrome - Doing well, Dr. Caryl Comes.  Chronic lower extremity edema -No changes.  Essential hypertension - Seems to have been elevated recently.  Perhaps addition of losartan or telmisartan might be helpful  for her.  I will defer to Dr. Lysle Rubens.  She does have lisinopril listed as an allergy causing a rash.  Back pain between shoulder blades - Lasted about an hour, intense pain.  She has not had any further episodes.  No radiation to chest.  Prior stress test in 09/11/2017 low risk with no ischemia.      COVID-19 Education: The signs and symptoms of COVID-19 were discussed with the patient and how to seek care for testing (follow up with PCP or arrange E-visit).  The importance of social distancing was discussed today.  Time:   Today, I have spent 12 minutes with the patient with telehealth technology discussing the above problems.     Medication Adjustments/Labs and Tests Ordered: Current medicines are reviewed at length with the patient today.  Concerns regarding medicines are outlined above.   Tests Ordered: No orders of the defined types were placed in this encounter.   Medication Changes: No orders of the defined types were placed in this encounter.   Disposition:  Follow up in 1 year(s)  Signed, Candee Furbish, MD  03/03/2019 4:17 PM    [facility]

## 2019-03-03 NOTE — Telephone Encounter (Signed)
Virtual Visit Pre-Appointment Phone Call  TELEPHONE CALL NOTE  Heidi [name] has been deemed a candidate for a follow-up tele-health visit to limit community exposure during the Covid-19 pandemic. I spoke with the patient via phone to ensure availability of phone/video source, confirm preferred email & phone number, and discuss instructions and expectations.  I reminded Heidi [name] to be prepared with any vital sign and/or heart rhythm information that could potentially be obtained via home monitoring, at the time of her visit. I reminded Heidi [name] to expect a phone call prior to her visit.   Patient agrees to consent below.  Cleon Gustin, RN 03/03/2019 10:45 AM   FULL LENGTH CONSENT FOR TELE-HEALTH VISIT   I hereby voluntarily request, consent and authorize [facility] and its employed or contracted physicians, physician assistants, nurse practitioners or other licensed health care professionals (the Practitioner), to provide me with telemedicine health care services (the "Services") as deemed necessary by the treating Practitioner. I acknowledge and consent to receive the Services by the Practitioner via telemedicine. I understand that the telemedicine visit will involve communicating with the Practitioner through live audiovisual communication technology and the disclosure of certain medical information by electronic transmission. I acknowledge that I have been given the opportunity to request an in-person assessment or other available alternative prior to the telemedicine visit and am voluntarily participating in the telemedicine visit.  I understand that I have the right to withhold or withdraw my consent to the use of telemedicine in the course of my care at any time, without affecting my right to future care or treatment, and that the Practitioner or I may terminate the telemedicine visit at any time. I understand that I have the right to inspect all information obtained  and/or recorded in the course of the telemedicine visit and may receive copies of available information for a reasonable fee.  I understand that some of the potential risks of receiving the Services via telemedicine include:  Marland Kitchen Delay or interruption in medical evaluation due to technological equipment failure or disruption; . Information transmitted may not be sufficient (e.g. poor resolution of images) to allow for appropriate medical decision making by the Practitioner; and/or  . In rare instances, security protocols could fail, causing a breach of personal health information.  Furthermore, I acknowledge that it is my responsibility to provide information about my medical history, conditions and care that is complete and accurate to the best of my ability. I acknowledge that Practitioner's advice, recommendations, and/or decision may be based on factors not within their control, such as incomplete or inaccurate data provided by me or distortions of diagnostic images or specimens that may result from electronic transmissions. I understand that the practice of medicine is not an exact science and that Practitioner makes no warranties or guarantees regarding treatment outcomes. I acknowledge that I will receive a copy of this consent concurrently upon execution via email to the email address I last provided but may also request a printed copy by calling the office of [place].    I understand that my insurance will be billed for this visit.   I have read or had this consent read to me. . I understand the contents of this consent, which adequately explains the benefits and risks of the Services being provided via telemedicine.  . I have been provided ample opportunity to ask questions regarding this consent and the Services and have had my questions answered to my satisfaction. Marland Kitchen  I give my informed consent for the services to be provided through the use of telemedicine in my medical care  By  participating in this telemedicine visit I agree to the above.

## 2019-03-03 NOTE — Patient Instructions (Signed)
Medication Instructions:  Your physician recommends that you continue on your current medications as directed. Please refer to the Current Medication list given to you today.  If you need a refill on your cardiac medications before your next appointment, please call your pharmacy.   Lab work: None ordered If you have labs (blood work) drawn today and your tests are completely normal, you will receive your results only by: Marland Kitchen MyChart Message (if you have MyChart) OR . A paper copy in the mail If you have any lab test that is abnormal or we need to change your treatment, we will call you to review the results.  Testing/Procedures: None ordered  Follow-Up: At [facility], you and your health needs are our priority.  As part of our continuing mission to provide you with exceptional heart care, we have created designated Provider Care Teams.  These Care Teams include your primary Cardiologist (physician) and Advanced Practice Providers (APPs -  Physician Assistants and Nurse Practitioners) who all work together to provide you with the care you need, when you need it. You will need a follow up appointment in 12 months.  Please call our office 2 months in advance to schedule this appointment.  You may see Candee Furbish, MD or one of the following Advanced Practice Providers on your designated Care Team:   Truitt Merle, NP Cecilie Kicks, NP . Kathyrn Drown, NP  Any Other Special Instructions Will Be Listed Below (If Applicable).

## 2019-03-04 DIAGNOSIS — H401432 Capsular glaucoma with pseudoexfoliation of lens, bilateral, moderate stage: Secondary | ICD-10-CM | POA: Diagnosis not present

## 2019-03-23 DIAGNOSIS — J0100 Acute maxillary sinusitis, unspecified: Secondary | ICD-10-CM | POA: Diagnosis not present

## 2019-03-29 DIAGNOSIS — M25531 Pain in right wrist: Secondary | ICD-10-CM | POA: Diagnosis not present

## 2019-03-29 DIAGNOSIS — M25552 Pain in left hip: Secondary | ICD-10-CM | POA: Diagnosis not present

## 2019-04-08 DIAGNOSIS — S52502A Unspecified fracture of the lower end of left radius, initial encounter for closed fracture: Secondary | ICD-10-CM | POA: Diagnosis not present

## 2019-04-08 DIAGNOSIS — E119 Type 2 diabetes mellitus without complications: Secondary | ICD-10-CM | POA: Diagnosis not present

## 2019-04-08 DIAGNOSIS — S60221A Contusion of right hand, initial encounter: Secondary | ICD-10-CM | POA: Diagnosis not present

## 2019-04-23 ENCOUNTER — Other Ambulatory Visit: Payer: Self-pay | Admitting: Internal Medicine

## 2019-04-23 DIAGNOSIS — Z0000 Encounter for general adult medical examination without abnormal findings: Secondary | ICD-10-CM | POA: Diagnosis not present

## 2019-04-23 DIAGNOSIS — E1122 Type 2 diabetes mellitus with diabetic chronic kidney disease: Secondary | ICD-10-CM | POA: Diagnosis not present

## 2019-04-23 DIAGNOSIS — Z1231 Encounter for screening mammogram for malignant neoplasm of breast: Secondary | ICD-10-CM

## 2019-04-23 DIAGNOSIS — I480 Paroxysmal atrial fibrillation: Secondary | ICD-10-CM | POA: Diagnosis not present

## 2019-04-23 DIAGNOSIS — F322 Major depressive disorder, single episode, severe without psychotic features: Secondary | ICD-10-CM | POA: Diagnosis not present

## 2019-04-23 DIAGNOSIS — G629 Polyneuropathy, unspecified: Secondary | ICD-10-CM | POA: Diagnosis not present

## 2019-04-23 DIAGNOSIS — I10 Essential (primary) hypertension: Secondary | ICD-10-CM | POA: Diagnosis not present

## 2019-04-23 DIAGNOSIS — I495 Sick sinus syndrome: Secondary | ICD-10-CM | POA: Diagnosis not present

## 2019-04-23 DIAGNOSIS — E039 Hypothyroidism, unspecified: Secondary | ICD-10-CM | POA: Diagnosis not present

## 2019-04-23 DIAGNOSIS — R413 Other amnesia: Secondary | ICD-10-CM | POA: Diagnosis not present

## 2019-04-23 DIAGNOSIS — H539 Unspecified visual disturbance: Secondary | ICD-10-CM

## 2019-04-23 DIAGNOSIS — G450 Vertebro-basilar artery syndrome: Secondary | ICD-10-CM | POA: Diagnosis not present

## 2019-04-23 DIAGNOSIS — M8580 Other specified disorders of bone density and structure, unspecified site: Secondary | ICD-10-CM

## 2019-04-23 DIAGNOSIS — I2510 Atherosclerotic heart disease of native coronary artery without angina pectoris: Secondary | ICD-10-CM | POA: Diagnosis not present

## 2019-04-23 DIAGNOSIS — I5032 Chronic diastolic (congestive) heart failure: Secondary | ICD-10-CM | POA: Diagnosis not present

## 2019-04-28 ENCOUNTER — Ambulatory Visit
Admission: RE | Admit: 2019-04-28 | Discharge: 2019-04-28 | Disposition: A | Discharge status: Home / Self Care | Payer: Medicare Other | Source: Ambulatory Visit | Attending: Internal Medicine | Admitting: Internal Medicine

## 2019-04-28 DIAGNOSIS — H539 Unspecified visual disturbance: Secondary | ICD-10-CM

## 2019-04-28 DIAGNOSIS — H538 Other visual disturbances: Secondary | ICD-10-CM | POA: Diagnosis not present

## 2019-05-05 DIAGNOSIS — N183 Chronic kidney disease, stage 3 (moderate): Secondary | ICD-10-CM | POA: Diagnosis not present

## 2019-05-05 DIAGNOSIS — F322 Major depressive disorder, single episode, severe without psychotic features: Secondary | ICD-10-CM | POA: Diagnosis not present

## 2019-05-05 DIAGNOSIS — E119 Type 2 diabetes mellitus without complications: Secondary | ICD-10-CM | POA: Diagnosis not present

## 2019-05-05 DIAGNOSIS — I5032 Chronic diastolic (congestive) heart failure: Secondary | ICD-10-CM | POA: Diagnosis not present

## 2019-05-05 DIAGNOSIS — D649 Anemia, unspecified: Secondary | ICD-10-CM | POA: Diagnosis not present

## 2019-05-05 DIAGNOSIS — I10 Essential (primary) hypertension: Secondary | ICD-10-CM | POA: Diagnosis not present

## 2019-05-05 DIAGNOSIS — E039 Hypothyroidism, unspecified: Secondary | ICD-10-CM | POA: Diagnosis not present

## 2019-05-05 DIAGNOSIS — G450 Vertebro-basilar artery syndrome: Secondary | ICD-10-CM | POA: Diagnosis not present

## 2019-05-05 DIAGNOSIS — I480 Paroxysmal atrial fibrillation: Secondary | ICD-10-CM | POA: Diagnosis not present

## 2019-05-05 DIAGNOSIS — E782 Mixed hyperlipidemia: Secondary | ICD-10-CM | POA: Diagnosis not present

## 2019-05-05 DIAGNOSIS — Z7984 Long term (current) use of oral hypoglycemic drugs: Secondary | ICD-10-CM | POA: Diagnosis not present

## 2019-05-05 DIAGNOSIS — H409 Unspecified glaucoma: Secondary | ICD-10-CM | POA: Diagnosis not present

## 2019-05-11 DIAGNOSIS — S60221D Contusion of right hand, subsequent encounter: Secondary | ICD-10-CM | POA: Diagnosis not present

## 2019-05-11 DIAGNOSIS — S52502D Unspecified fracture of the lower end of left radius, subsequent encounter for closed fracture with routine healing: Secondary | ICD-10-CM | POA: Diagnosis not present

## 2019-05-24 ENCOUNTER — Ambulatory Visit (INDEPENDENT_AMBULATORY_CARE_PROVIDER_SITE_OTHER): Payer: Medicare Other | Admitting: *Deleted

## 2019-05-24 DIAGNOSIS — I495 Sick sinus syndrome: Secondary | ICD-10-CM

## 2019-05-24 LAB — CUP PACEART REMOTE DEVICE CHECK
Battery Impedance: 697 Ohm
Battery Remaining Longevity: 85 mo
Battery Voltage: 2.78 V
Brady Statistic AP VP Percent: 3 %
Brady Statistic AP VS Percent: 7 %
Brady Statistic AS VP Percent: 0 %
Brady Statistic AS VS Percent: 90 %
Date Time Interrogation Session: 20200817135510
Implantable Lead Implant Date: 20070710
Implantable Lead Implant Date: 20070710
Implantable Lead Location: 753859
Implantable Lead Location: 753860
Implantable Lead Model: 4469
Implantable Lead Model: 4470
Implantable Lead Serial Number: 483166
Implantable Lead Serial Number: 541525
Implantable Pulse Generator Implant Date: 20130410
Lead Channel Impedance Value: 573 Ohm
Lead Channel Impedance Value: 692 Ohm
Lead Channel Pacing Threshold Amplitude: 0.75 V
Lead Channel Pacing Threshold Amplitude: 0.875 V
Lead Channel Pacing Threshold Pulse Width: 0.4 ms
Lead Channel Pacing Threshold Pulse Width: 0.4 ms
Lead Channel Setting Pacing Amplitude: 2 V
Lead Channel Setting Pacing Amplitude: 2.5 V
Lead Channel Setting Pacing Pulse Width: 0.4 ms
Lead Channel Setting Sensing Sensitivity: 4 mV

## 2019-06-01 ENCOUNTER — Encounter: Payer: Self-pay | Admitting: Cardiology

## 2019-06-01 NOTE — Progress Notes (Signed)
Remote pacemaker transmission.   

## 2019-06-21 DIAGNOSIS — H401412 Capsular glaucoma with pseudoexfoliation of lens, right eye, moderate stage: Secondary | ICD-10-CM | POA: Diagnosis not present

## 2019-06-21 DIAGNOSIS — H401422 Capsular glaucoma with pseudoexfoliation of lens, left eye, moderate stage: Secondary | ICD-10-CM | POA: Diagnosis not present

## 2019-06-21 DIAGNOSIS — T1501XA Foreign body in cornea, right eye, initial encounter: Secondary | ICD-10-CM | POA: Diagnosis not present

## 2019-07-02 ENCOUNTER — Ambulatory Visit
Admission: RE | Admit: 2019-07-02 | Discharge: 2019-07-02 | Disposition: A | Discharge status: Home / Self Care | Payer: Medicare Other | Source: Ambulatory Visit | Attending: Internal Medicine | Admitting: Internal Medicine

## 2019-07-02 ENCOUNTER — Other Ambulatory Visit: Payer: Self-pay

## 2019-07-02 DIAGNOSIS — Z780 Asymptomatic menopausal state: Secondary | ICD-10-CM | POA: Diagnosis not present

## 2019-07-02 DIAGNOSIS — M8589 Other specified disorders of bone density and structure, multiple sites: Secondary | ICD-10-CM | POA: Diagnosis not present

## 2019-07-02 DIAGNOSIS — Z1231 Encounter for screening mammogram for malignant neoplasm of breast: Secondary | ICD-10-CM | POA: Diagnosis not present

## 2019-07-02 DIAGNOSIS — M8580 Other specified disorders of bone density and structure, unspecified site: Secondary | ICD-10-CM

## 2019-07-13 DIAGNOSIS — E559 Vitamin D deficiency, unspecified: Secondary | ICD-10-CM | POA: Diagnosis not present

## 2019-07-13 DIAGNOSIS — M8580 Other specified disorders of bone density and structure, unspecified site: Secondary | ICD-10-CM | POA: Diagnosis not present

## 2019-07-13 DIAGNOSIS — M25532 Pain in left wrist: Secondary | ICD-10-CM | POA: Diagnosis not present

## 2019-07-13 DIAGNOSIS — R5383 Other fatigue: Secondary | ICD-10-CM | POA: Diagnosis not present

## 2019-07-20 DIAGNOSIS — Z23 Encounter for immunization: Secondary | ICD-10-CM | POA: Diagnosis not present

## 2019-07-26 DIAGNOSIS — N1831 Chronic kidney disease, stage 3a: Secondary | ICD-10-CM | POA: Diagnosis not present

## 2019-07-26 DIAGNOSIS — I5032 Chronic diastolic (congestive) heart failure: Secondary | ICD-10-CM | POA: Diagnosis not present

## 2019-07-26 DIAGNOSIS — M8580 Other specified disorders of bone density and structure, unspecified site: Secondary | ICD-10-CM | POA: Diagnosis not present

## 2019-07-26 DIAGNOSIS — E782 Mixed hyperlipidemia: Secondary | ICD-10-CM | POA: Diagnosis not present

## 2019-07-26 DIAGNOSIS — G450 Vertebro-basilar artery syndrome: Secondary | ICD-10-CM | POA: Diagnosis not present

## 2019-07-26 DIAGNOSIS — H409 Unspecified glaucoma: Secondary | ICD-10-CM | POA: Diagnosis not present

## 2019-07-26 DIAGNOSIS — I2510 Atherosclerotic heart disease of native coronary artery without angina pectoris: Secondary | ICD-10-CM | POA: Diagnosis not present

## 2019-07-26 DIAGNOSIS — E119 Type 2 diabetes mellitus without complications: Secondary | ICD-10-CM | POA: Diagnosis not present

## 2019-07-26 DIAGNOSIS — F322 Major depressive disorder, single episode, severe without psychotic features: Secondary | ICD-10-CM | POA: Diagnosis not present

## 2019-07-26 DIAGNOSIS — I10 Essential (primary) hypertension: Secondary | ICD-10-CM | POA: Diagnosis not present

## 2019-07-26 DIAGNOSIS — I480 Paroxysmal atrial fibrillation: Secondary | ICD-10-CM | POA: Diagnosis not present

## 2019-07-26 DIAGNOSIS — E1122 Type 2 diabetes mellitus with diabetic chronic kidney disease: Secondary | ICD-10-CM | POA: Diagnosis not present

## 2019-07-29 DIAGNOSIS — I5032 Chronic diastolic (congestive) heart failure: Secondary | ICD-10-CM | POA: Diagnosis not present

## 2019-07-29 DIAGNOSIS — E782 Mixed hyperlipidemia: Secondary | ICD-10-CM | POA: Diagnosis not present

## 2019-07-29 DIAGNOSIS — I480 Paroxysmal atrial fibrillation: Secondary | ICD-10-CM | POA: Diagnosis not present

## 2019-07-29 DIAGNOSIS — I495 Sick sinus syndrome: Secondary | ICD-10-CM | POA: Diagnosis not present

## 2019-07-29 DIAGNOSIS — F329 Major depressive disorder, single episode, unspecified: Secondary | ICD-10-CM | POA: Diagnosis not present

## 2019-07-29 DIAGNOSIS — I10 Essential (primary) hypertension: Secondary | ICD-10-CM | POA: Diagnosis not present

## 2019-07-29 DIAGNOSIS — G629 Polyneuropathy, unspecified: Secondary | ICD-10-CM | POA: Diagnosis not present

## 2019-07-29 DIAGNOSIS — M8580 Other specified disorders of bone density and structure, unspecified site: Secondary | ICD-10-CM | POA: Diagnosis not present

## 2019-07-29 DIAGNOSIS — E039 Hypothyroidism, unspecified: Secondary | ICD-10-CM | POA: Diagnosis not present

## 2019-07-29 DIAGNOSIS — I2510 Atherosclerotic heart disease of native coronary artery without angina pectoris: Secondary | ICD-10-CM | POA: Diagnosis not present

## 2019-07-29 DIAGNOSIS — E1122 Type 2 diabetes mellitus with diabetic chronic kidney disease: Secondary | ICD-10-CM | POA: Diagnosis not present

## 2019-07-29 DIAGNOSIS — G450 Vertebro-basilar artery syndrome: Secondary | ICD-10-CM | POA: Diagnosis not present

## 2019-08-04 DIAGNOSIS — H401412 Capsular glaucoma with pseudoexfoliation of lens, right eye, moderate stage: Secondary | ICD-10-CM | POA: Diagnosis not present

## 2019-08-04 DIAGNOSIS — H401422 Capsular glaucoma with pseudoexfoliation of lens, left eye, moderate stage: Secondary | ICD-10-CM | POA: Diagnosis not present

## 2019-08-23 ENCOUNTER — Encounter: Payer: Medicare Other | Admitting: *Deleted

## 2019-09-06 ENCOUNTER — Ambulatory Visit (INDEPENDENT_AMBULATORY_CARE_PROVIDER_SITE_OTHER): Payer: Medicare Other | Admitting: *Deleted

## 2019-09-06 DIAGNOSIS — I495 Sick sinus syndrome: Secondary | ICD-10-CM | POA: Diagnosis not present

## 2019-09-06 LAB — CUP PACEART REMOTE DEVICE CHECK
Battery Impedance: 747 Ohm
Battery Remaining Longevity: 81 mo
Battery Voltage: 2.78 V
Brady Statistic AP VP Percent: 4 %
Brady Statistic AP VS Percent: 10 %
Brady Statistic AS VP Percent: 0 %
Brady Statistic AS VS Percent: 86 %
Date Time Interrogation Session: 20201130154013
Implantable Lead Implant Date: 20070710
Implantable Lead Implant Date: 20070710
Implantable Lead Location: 753859
Implantable Lead Location: 753860
Implantable Lead Model: 4469
Implantable Lead Model: 4470
Implantable Lead Serial Number: 483166
Implantable Lead Serial Number: 541525
Implantable Pulse Generator Implant Date: 20130410
Lead Channel Impedance Value: 582 Ohm
Lead Channel Impedance Value: 741 Ohm
Lead Channel Pacing Threshold Amplitude: 0.75 V
Lead Channel Pacing Threshold Amplitude: 0.75 V
Lead Channel Pacing Threshold Pulse Width: 0.4 ms
Lead Channel Pacing Threshold Pulse Width: 0.4 ms
Lead Channel Setting Pacing Amplitude: 2 V
Lead Channel Setting Pacing Amplitude: 2.5 V
Lead Channel Setting Pacing Pulse Width: 0.4 ms
Lead Channel Setting Sensing Sensitivity: 5.6 mV

## 2019-09-29 NOTE — Progress Notes (Signed)
PPM remote 

## 2019-10-16 ENCOUNTER — Ambulatory Visit: Payer: Medicare Other | Attending: Internal Medicine

## 2019-10-16 DIAGNOSIS — Z23 Encounter for immunization: Secondary | ICD-10-CM | POA: Insufficient documentation

## 2019-10-16 NOTE — Progress Notes (Signed)
   Covid-19 Vaccination Clinic  Name:  Heidi [name]    MRN: [id number] DOB: Jan 22, 1941  10/16/2019  [name] was observed post Covid-19 immunization for 30 minutes based on pre-vaccination screening without incidence. She was provided with Vaccine Information Sheet and instruction to access the V-Safe system.   [name] was instructed to call 911 with any severe reactions post vaccine: Marland Kitchen Difficulty breathing  . Swelling of your face and throat  . A fast heartbeat  . A bad rash all over your body  . Dizziness and weakness    Immunizations Administered    Name Date Dose VIS Date Route   Pfizer COVID-19 Vaccine 10/16/2019 12:32 PM 0.3 mL 09/17/2019 Intramuscular   Manufacturer: [employer]   Lot: [id number]   [place]: [id number]

## 2019-10-21 ENCOUNTER — Telehealth (INDEPENDENT_AMBULATORY_CARE_PROVIDER_SITE_OTHER): Payer: Medicare Other | Admitting: Internal Medicine

## 2019-10-21 ENCOUNTER — Other Ambulatory Visit: Payer: Self-pay

## 2019-10-21 VITALS — BP 140/86 | Ht 61.0 in | Wt 143.0 lb

## 2019-10-21 DIAGNOSIS — I495 Sick sinus syndrome: Secondary | ICD-10-CM | POA: Diagnosis not present

## 2019-10-21 DIAGNOSIS — I5032 Chronic diastolic (congestive) heart failure: Secondary | ICD-10-CM

## 2019-10-21 DIAGNOSIS — Z950 Presence of cardiac pacemaker: Secondary | ICD-10-CM

## 2019-10-21 NOTE — Progress Notes (Signed)
Electrophysiology TeleHealth Note   Due to national recommendations of social distancing due to COVID 19, an audio/video telehealth visit is felt to be most appropriate for this patient at this time.  See MyChart message from today for the patient's consent to telehealth for [facility]   Date:  10/21/2019   ID:  Heidi [name], DOB Jun 19, 1941, MRN [id number]  Location: patient's home  Provider location: [address] [place]  Evaluation Performed: Follow-up visit  PCP:  Wenda Low, MD  Cardiologist:    Electrophysiologist:  SK   Chief Complaint:  Bradycardia and pacemaker   History of Present Illness:    Heidi [name] is a 79 y.o. female who presents via audio/video conferencing for a telehealth visit today.  Since last being seen in our clinic for pacemaker for bradycardia, the patient reports having had "a stroke" diagnosed by history by her daughter and PCP; also broke her wrist  Has orthostatic lightheadedness with some presyncope  The patient denies chest pain, shortness of breath, nocturnal dyspnea, orthopnea or peripheral edema.  There have been no palpitation orr syncope.      Cardiac catheterization was performed on 02/12/2006 which showed minor irregularities, mid RCA 40%, left main normal, LAD normal, ejection fraction 65%.   9/17 Echo normal LV function  12/18 Myoview without ischemia   Date Cr K  9/17 0.93 3.7  8/18 1.3 3.7  1/20 1.03 4.0    The patient denies symptoms of fevers, chills, cough, or new SOB worrisome for COVID 19  Vaccine last week   Past Medical History:  Diagnosis Date  . Anxiety   . Atrial fibrillation (HCC)    Intermittent  . CAD (coronary artery disease)    non obstructive  . CKD (chronic kidney disease)   . Diabetes mellitus without complication ([place])   . Dysrhythmia    PAF  . GERD (gastroesophageal reflux disease)   . Glaucoma    [place]  . HTN (hypertension)   . Hyperlipidemia   .  Hypothyroid   . Major depression    10-17-2010: hospitalized for suicidal, [name] (D/C 10-30-2010)  . Migraine headache   . OSA (obstructive sleep apnea)    CPAP nightly  . Peripheral neuropathy   . Seasonal allergies   . Sleep apnea   . Tachy-brady syndrome ([place]) 04-2006   PPM placed    Past Surgical History:  Procedure Laterality Date  . ABDOMINAL ADHESION SURGERY  2008   exploratory to remove attached to the abdominal wall, stomach and intestines.   . ABDOMINAL HYSTERECTOMY  1976  . APPENDECTOMY    . BREAST EXCISIONAL BIOPSY    . BREAST SURGERY     fibroids removed, benign  . CARDIAC CATHETERIZATION  02-12-2006   Minor irregularities: RCA-mid 40%, LM-normal, LAD-normal. EF 65%.  . CHOLECYSTECTOMY    . ECTOPIC PREGNANCY SURGERY     treatment x3, last one 11-02-2009 [facility]  . goiter    . goiter removed    . MASS EXCISION Right 10/18/2015   Procedure: EXCISION OF 6CM MASS ON RIGHT  NECK/BACK;  Surgeon: [name], MD;  Location: [place];  Service: General;  Laterality: Right;  . PACEMAKER GENERATOR CHANGE N/A 01/15/2012   Procedure: PACEMAKER GENERATOR CHANGE;  Surgeon: Deboraha Sprang, MD;  Location: [facility] CATH LAB;  Service: Cardiovascular;  Laterality: N/A;  . PACEMAKER INSERTION     Permanent. Medtronic EnRhyth 04/2006, set as DDDR  .  TUMOR REMOVAL  2000   Fibroid from breast x4-most recent  . WRIST FRACTURE SURGERY  11-2007   right, had metal plate inserted    Current Outpatient Medications  Medication Sig Dispense Refill  . amLODipine (NORVASC) 5 MG tablet Take 5 mg by mouth daily.    Marland Kitchen aspirin EC 81 MG tablet Take 81 mg by mouth every evening.     . benazepril (LOTENSIN) 10 MG tablet Take 10 mg by mouth.    . cetirizine (ZYRTEC) 10 MG tablet Take 10 mg by mouth daily after breakfast.     . Cholecalciferol (VITAMIN D) 2000 UNITS tablet Take 5,000 Units by mouth daily.     . cholestyramine (QUESTRAN) 4 g packet Take  4 g by mouth daily.  6  . fenofibrate 160 MG tablet Take 160 mg by mouth daily.    . ferrous sulfate (FERROUSUL) 325 (65 FE) MG tablet Take 325 mg by mouth daily with breakfast.    . fluticasone (FLONASE) 50 MCG/ACT nasal spray Place 2 sprays into the nose daily as needed for allergies.     . furosemide (LASIX) 20 MG tablet TAKE 1 TABLET(20 MG) BY MOUTH 3 TIMES A WEEK 45 tablet 2  . galantamine (RAZADYNE ER) 8 MG 24 hr capsule Take 1 capsule by mouth daily.  2  . glimepiride (AMARYL) 1 MG tablet Take 1 mg by mouth daily after breakfast.    . hydrOXYzine (VISTARIL) 25 MG capsule Take 25 mg by mouth at bedtime.     Marland Kitchen levothyroxine (SYNTHROID, LEVOTHROID) 75 MCG tablet Take 75 mcg by mouth daily before breakfast.     . metFORMIN (GLUCOPHAGE) 500 MG tablet Take 1 tablet by mouth 2 (two) times daily.  12  . metoprolol succinate (TOPROL-XL) 100 MG 24 hr tablet TAKE 1 TABLET(100 MG) BY MOUTH DAILY 90 tablet 2  . nortriptyline (PAMELOR) 75 MG capsule Take 150 mg by mouth at bedtime.     . pantoprazole (PROTONIX) 40 MG tablet Take 80 mg by mouth daily.     Marland Kitchen spironolactone (ALDACTONE) 25 MG tablet Take 1 tablet (25 mg total) by mouth daily. 30 tablet 11  . gabapentin (NEURONTIN) 300 MG capsule Take 300 mg by mouth 3 (three) times daily.       No current facility-administered medications for this visit.    Allergies:   Clopidogrel, Crestor [rosuvastatin calcium], Effexor [venlafaxine hydrochloride], Hibiclens [chlorhexidine gluconate], Lipitor [atorvastatin calcium], Nardil, Penicillins, Plavix [clopidogrel bisulfate], Rosuvastatin, Simvastatin, Sulfonamide derivatives, Venlafaxine, Zestril [lisinopril], Adhesive [tape], and Atorvastatin   Social History:  The patient  reports that she has quit smoking. She has never used smokeless tobacco. She reports that she does not drink alcohol or use drugs.   Family History:  The patient's   family history includes Atrial fibrillation in her brother; Stomach  cancer in her father.   ROS:  Please see the history of present illness.   All other systems are personally reviewed and negative.    Exam:    Vital Signs:  BP 140/86   Ht 5\' 1"  (1.549 m)   Wt 143 lb (64.9 kg)   BMI 27.02 kg/m     Well appearing, alert and conversant, regular work of breathing,  good skin color Eyes- anicteric, neuro- grossly intact, skin- no apparent rash or lesions or cyanosis, mouth- oral mucosa is pink   Labs/Other Tests and Data Reviewed:    Recent Labs: No results found for requested labs within last 8760 hours.   Wt  Readings from Last 3 Encounters:  10/21/19 143 lb (64.9 kg)  03/03/19 149 lb 9.6 oz (67.9 kg)  10/22/18 151 lb 6.4 oz (68.7 kg)     Other studies personally reviewed:    Last device remote is reviewed from [place] PDF dated 11/20 which reveals normal device function,   arrhythmias - none    ASSESSMENT & PLAN:   Sinus tachycardia  Sinus bradycardia/tachycardia bradycardia syndrome  Atrial fibrillation-paroxysmal  On problem list but none since device implant 10/13  Hypertension  Pacemaker Medtronic   HFpEF   Euvolemic by descriptions and symptoms continue current meds   No intercurrent atrial fibrillation or flutter  Device function normal  BP well controlled at home  Orhtostatic presyncope, her daughter is a Marine scientist and I have asked the patient to ask her about doing orthostatic VS and send them to [place] by mychart   COVID 19 screen The patient denies symptoms of COVID 19 at this time.  The importance of social distancing was discussed today.  Follow-up:  5 m Next remote: As Scheduled   Current medicines are reviewed at length with the patient today.   The patient does not have concerns regarding her medicines.  The following changes were made today:  none  Labs/ tests ordered today include: As above  No orders of the defined types were placed in this encounter.   Future tests ( post COVID )     Patient  Risk:  after full review of this patients clinical status, I feel that they are at moderate risk at this time.  Today, I have spent 9* minutes with the patient with telehealth technology discussing the above.  Signed, Virl Axe, MD  10/21/2019 2:13 PM     [address] [place] [id number] [phone] (office) [phone] (fax)

## 2019-10-28 ENCOUNTER — Telehealth: Payer: Medicare Other | Admitting: Internal Medicine

## 2019-10-29 NOTE — Telephone Encounter (Signed)
Spoke with pt and advised per Dr Caryl Comes decrease your current dose of Amlodipine 5mg  to 2.5mg  by mouth daily to help with orthostatic blood pressures.  Pt verbalizes understanding and agrees with plan.

## 2019-11-01 ENCOUNTER — Other Ambulatory Visit: Payer: Self-pay | Admitting: Internal Medicine

## 2019-11-06 ENCOUNTER — Ambulatory Visit: Payer: Medicare Other | Attending: Internal Medicine

## 2019-11-06 ENCOUNTER — Ambulatory Visit: Payer: Medicare Other

## 2019-11-06 DIAGNOSIS — Z23 Encounter for immunization: Secondary | ICD-10-CM

## 2019-11-06 NOTE — Progress Notes (Signed)
   Covid-19 Vaccination Clinic  Name:  Heidi [name]    MRN: [id number] DOB: [birth year]  11/06/2019  [name] was observed post Covid-19 immunization for 15 minutes without incidence. She was provided with Vaccine Information Sheet and instruction to access the V-Safe system.   [name] was instructed to call 911 with any severe reactions post vaccine: Marland Kitchen Difficulty breathing  . Swelling of your face and throat  . A fast heartbeat  . A bad rash all over your body  . Dizziness and weakness    Immunizations Administered    Name Date Dose VIS Date Route   Pfizer COVID-19 Vaccine 11/06/2019 12:05 PM 0.3 mL 09/17/2019 Intramuscular   Manufacturer: [place]   Lot: [id number]   [place]: [id number]

## 2019-11-10 ENCOUNTER — Other Ambulatory Visit: Payer: Self-pay | Admitting: Internal Medicine

## 2019-12-06 ENCOUNTER — Ambulatory Visit (INDEPENDENT_AMBULATORY_CARE_PROVIDER_SITE_OTHER): Payer: Medicare Other | Admitting: *Deleted

## 2019-12-06 DIAGNOSIS — I495 Sick sinus syndrome: Secondary | ICD-10-CM

## 2019-12-06 LAB — CUP PACEART REMOTE DEVICE CHECK
Battery Impedance: 795 Ohm
Battery Remaining Longevity: 78 mo
Battery Voltage: 2.78 V
Brady Statistic AP VP Percent: 4 %
Brady Statistic AP VS Percent: 11 %
Brady Statistic AS VP Percent: 0 %
Brady Statistic AS VS Percent: 85 %
Date Time Interrogation Session: 20210301092215
Implantable Lead Implant Date: 20070710
Implantable Lead Implant Date: 20070710
Implantable Lead Location: 753859
Implantable Lead Location: 753860
Implantable Lead Model: 4469
Implantable Lead Model: 4470
Implantable Lead Serial Number: 483166
Implantable Lead Serial Number: 541525
Implantable Pulse Generator Implant Date: 20130410
Lead Channel Impedance Value: 592 Ohm
Lead Channel Impedance Value: 719 Ohm
Lead Channel Pacing Threshold Amplitude: 0.75 V
Lead Channel Pacing Threshold Amplitude: 0.75 V
Lead Channel Pacing Threshold Pulse Width: 0.4 ms
Lead Channel Pacing Threshold Pulse Width: 0.4 ms
Lead Channel Setting Pacing Amplitude: 2 V
Lead Channel Setting Pacing Amplitude: 2.5 V
Lead Channel Setting Pacing Pulse Width: 0.4 ms
Lead Channel Setting Sensing Sensitivity: 5.6 mV

## 2019-12-06 NOTE — Progress Notes (Signed)
PPM Remote  

## 2020-03-08 ENCOUNTER — Ambulatory Visit: Payer: Medicare Other | Admitting: Cardiology

## 2020-03-08 ENCOUNTER — Ambulatory Visit (INDEPENDENT_AMBULATORY_CARE_PROVIDER_SITE_OTHER): Payer: Medicare Other | Admitting: *Deleted

## 2020-03-08 ENCOUNTER — Other Ambulatory Visit: Payer: Self-pay

## 2020-03-08 ENCOUNTER — Encounter: Payer: Self-pay | Admitting: Cardiology

## 2020-03-08 VITALS — BP 120/70 | HR 91 | Ht 61.0 in | Wt 142.0 lb

## 2020-03-08 DIAGNOSIS — I480 Paroxysmal atrial fibrillation: Secondary | ICD-10-CM | POA: Diagnosis not present

## 2020-03-08 DIAGNOSIS — Z950 Presence of cardiac pacemaker: Secondary | ICD-10-CM

## 2020-03-08 DIAGNOSIS — I495 Sick sinus syndrome: Secondary | ICD-10-CM | POA: Diagnosis not present

## 2020-03-08 DIAGNOSIS — I10 Essential (primary) hypertension: Secondary | ICD-10-CM

## 2020-03-08 LAB — CUP PACEART REMOTE DEVICE CHECK
Battery Impedance: 900 Ohm
Battery Remaining Longevity: 73 mo
Battery Voltage: 2.78 V
Brady Statistic AP VP Percent: 4 %
Brady Statistic AP VS Percent: 12 %
Brady Statistic AS VP Percent: 0 %
Brady Statistic AS VS Percent: 84 %
Date Time Interrogation Session: 20210602100715
Implantable Lead Implant Date: 20070710
Implantable Lead Implant Date: 20070710
Implantable Lead Location: 753859
Implantable Lead Location: 753860
Implantable Lead Model: 4469
Implantable Lead Model: 4470
Implantable Lead Serial Number: 483166
Implantable Lead Serial Number: 541525
Implantable Pulse Generator Implant Date: 20130410
Lead Channel Impedance Value: 601 Ohm
Lead Channel Impedance Value: 734 Ohm
Lead Channel Pacing Threshold Amplitude: 0.75 V
Lead Channel Pacing Threshold Amplitude: 0.875 V
Lead Channel Pacing Threshold Pulse Width: 0.4 ms
Lead Channel Pacing Threshold Pulse Width: 0.4 ms
Lead Channel Setting Pacing Amplitude: 2 V
Lead Channel Setting Pacing Amplitude: 2.5 V
Lead Channel Setting Pacing Pulse Width: 0.4 ms
Lead Channel Setting Sensing Sensitivity: 5.6 mV

## 2020-03-08 NOTE — Progress Notes (Signed)
Cardiology Office Note:    Date:  03/08/2020   ID:  Heidi Heidi, DOB Dec 24, 1940, MRN [id number]  PCP:  Heidi Low, MD  Cardiologist:  Heidi Furbish, MD  Electrophysiologist:  None   Referring MD: Heidi Low, MD     History of Present Illness:    Heidi Heidi is a 79 y.o. female here for the follow-up pacemaker, bradycardia.  Orthostatic at times.  Reviewed Dr. Richardson Landry [name]'s note.  Overall she has had some return of disequilibrium.  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  Overall no chest pain no shortness of breath.  Cardiac catheterization was performed on 02/12/2006 which showed minor irregularities, mid RCA 40%, left main normal, LAD normal, ejection fraction 65%. 9/17 Echo normal LV function  12/18 Myoview without ischemia  Past Medical History:  Diagnosis Date  . Anxiety   . Atrial fibrillation (HCC)    Intermittent  . CAD (coronary artery disease)    non obstructive  . CKD (chronic kidney disease)   . Diabetes mellitus without complication ([place])   . Dysrhythmia    PAF  . GERD (gastroesophageal reflux disease)   . Glaucoma    [place]  . HTN (hypertension)   . Hyperlipidemia   . Hypothyroid   . Major depression    10-17-2010: hospitalized for suicidal, Heidi Heidi (D/C 10-30-2010)  . Migraine headache   . OSA (obstructive sleep apnea)    CPAP nightly  . Peripheral neuropathy   . Seasonal allergies   . Sleep apnea   . 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 ABDOMINAL ADHESION SURGERY  2008   exploratory to remove attached to the abdominal wall, stomach and intestines.   . ABDOMINAL HYSTERECTOMY  1976  . APPENDECTOMY    . BREAST EXCISIONAL BIOPSY    . BREAST SURGERY     fibroids removed, benign  . CARDIAC CATHETERIZATION  02-12-2006   Minor irregularities: RCA-mid 40%,  LM-normal, LAD-normal. EF 65%.  . CHOLECYSTECTOMY    . ECTOPIC PREGNANCY SURGERY     treatment x3, last one 11-02-2009 at [facility]  . goiter    . goiter removed    . MASS EXCISION Right 10/18/2015   Procedure: EXCISION OF 6CM MASS ON RIGHT  NECK/BACK;  Surgeon: Heidi Hausen, MD;  Location: [place];  Service: General;  Laterality: Right;  . PACEMAKER GENERATOR CHANGE N/A 01/15/2012   Procedure: PACEMAKER GENERATOR CHANGE;  Surgeon: Heidi Sprang, MD;  Location: [facility] CATH LAB;  Service: Cardiovascular;  Laterality: N/A;  . PACEMAKER INSERTION     Permanent. Medtronic EnRhyth 04/2006, set as DDDR  . TUMOR REMOVAL  2000   Fibroid from breast x4-most recent  . WRIST FRACTURE SURGERY  11-2007   right, had metal plate inserted    Current Medications: Current Meds  Medication Sig  . amLODipine (NORVASC) 5 MG tablet Take 5 mg by mouth daily.  Marland Kitchen aspirin EC 81 MG tablet Take 81 mg by mouth every evening.   . benazepril (LOTENSIN) 10 MG tablet Take 10 mg by mouth.  . cetirizine (ZYRTEC) 10 MG tablet Take 10 mg by mouth daily after breakfast.   . Cholecalciferol (VITAMIN D) 2000 UNITS tablet Take 5,000 Units by mouth daily.   . cholestyramine (QUESTRAN) 4 g packet  Take 4 g by mouth daily.  . fenofibrate 160 MG tablet Take 160 mg by mouth daily.  . ferrous sulfate (FERROUSUL) 325 (65 FE) MG tablet Take 325 mg by mouth daily with breakfast.  . fluticasone (FLONASE) 50 MCG/ACT nasal spray Place 2 sprays into the nose daily as needed for allergies.   . furosemide (LASIX) 20 MG tablet TAKE 1 TABLET BY MOUTH 3 TIMES A WEEK  . gabapentin (NEURONTIN) 300 MG capsule Take 300 mg by mouth 3 (three) times daily.    Marland Kitchen galantamine (RAZADYNE ER) 8 MG 24 hr capsule Take 1 capsule by mouth daily.  Marland Kitchen glimepiride (AMARYL) 1 MG tablet Take 1 mg by mouth daily after breakfast.  . hydrOXYzine (VISTARIL) 25 MG capsule Take 25 mg by mouth at bedtime.   Marland Kitchen levothyroxine (SYNTHROID, LEVOTHROID) 75  MCG tablet Take 75 mcg by mouth daily before breakfast.   . metFORMIN (GLUCOPHAGE) 500 MG tablet Take 1 tablet by mouth 2 (two) times daily.  . metoprolol succinate (TOPROL-XL) 100 MG 24 hr tablet TAKE 1 TABLET(100 MG) BY MOUTH DAILY  . nortriptyline (PAMELOR) 75 MG capsule Take 150 mg by mouth at bedtime.   . pantoprazole (PROTONIX) 40 MG tablet Take 80 mg by mouth daily.   Marland Kitchen spironolactone (ALDACTONE) 25 MG tablet Take 1 tablet (25 mg total) by mouth daily.     Allergies:   Clopidogrel, Crestor [rosuvastatin calcium], Effexor [venlafaxine hydrochloride], Hibiclens [chlorhexidine gluconate], Lipitor [atorvastatin calcium], Nardil, Penicillins, Plavix [clopidogrel bisulfate], Rosuvastatin, Simvastatin, Sulfonamide derivatives, Venlafaxine, Zestril [lisinopril], Adhesive [tape], and Atorvastatin   Social History   Socioeconomic History  . Marital status: Widowed    Spouse name: Not on file  . Number of children: Not on file  . Years of education: Not on file  . Highest education level: Not on file  Occupational History  . Not on file  Tobacco Use  . Smoking status: Former Research scientist (life sciences)  . Smokeless tobacco: Never Used  Substance and Sexual Activity  . Alcohol use: No  . Drug use: No  . Sexual activity: Never  Other Topics Concern  . Not on file  Social History Narrative  . Not on file   Social Determinants of Health   Financial Resource Strain:   . Difficulty of Paying Living Expenses:   Food Insecurity:   . Worried About Charity fundraiser in the Last Year:   . Arboriculturist in the Last Year:   Transportation Needs:   . Film/video editor (Medical):   Marland Kitchen Lack of Transportation (Non-Medical):   Physical Activity:   . Days of Exercise per Week:   . Minutes of Exercise per Session:   Stress:   . Feeling of Stress :   Social Connections:   . Frequency of Communication with Friends and Family:   . Frequency of Social Gatherings with Friends and Family:   . Attends  Religious Services:   . Active Member of Clubs or Organizations:   . Attends Archivist Meetings:   Marland Kitchen Marital Status:      Family History: The patient's family history includes Atrial fibrillation in her brother; Stomach cancer in her father.  ROS:   Please see the history of present illness.    Disequilibrium, no fevers no chills no orthopnea no PND all other systems reviewed and are negative.  EKGs/Labs/Other Studies Reviewed:     EKG:  EKG is  ordered today.  The ekg ordered today demonstrates sinus rhythm with left  anterior fascicular block.  Recent Labs: No results found for requested labs within last 8760 hours.  Recent Lipid Panel    Component Value Date/Time   CHOL 188 04/02/2011 0905   TRIG 85.0 04/02/2011 0905   HDL 58.40 04/02/2011 0905   CHOLHDL 3 04/02/2011 0905   VLDL 17.0 04/02/2011 0905   LDLCALC 113 (H) 04/02/2011 0905    Physical Exam:    VS:  BP 120/70   Pulse 91   Ht 5\' 1"  (1.549 m)   Wt 142 lb (64.4 kg)   SpO2 97%   BMI 26.83 kg/m     Wt Readings from Last 3 Encounters:  03/08/20 142 lb (64.4 kg)  10/21/19 143 lb (64.9 kg)  03/03/19 149 lb 9.6 oz (67.9 kg)     GEN:  Well nourished, well developed in no acute distress HEENT: Normal NECK: No JVD; No carotid bruits LYMPHATICS: No lymphadenopathy CARDIAC: RRR, no murmurs, rubs, gallops RESPIRATORY:  Clear to auscultation without rales, wheezing or rhonchi  ABDOMEN: Soft, non-tender, non-distended MUSCULOSKELETAL:  No edema; No deformity  SKIN: Warm and dry NEUROLOGIC:  Alert and oriented x 3 PSYCHIATRIC:  Normal affect   ASSESSMENT:    1. Paroxysmal atrial fibrillation (HCC)   2. Pacemaker-mdt   3. Essential hypertension    PLAN:    In order of problems listed above:  Atrial fibrillation paroxysmal -Has not had any atrial fibrillation since device implant in 2013.  Pacemaker -Medtronic  CHA2DS2-VASc 5 (TIA in June 2020, right eye muscle affected). [place].  -We have  talked in the past about anticoagulation however she has not had any episodes since 2013 at least.  Continue to monitor with her device.  If this returns, we will need to rediscuss.  Minor CAD -RCA 40%.  Essential hypertension -Much better control today.  120/70.  Disequilibrium -She has been diagnosed with vertebrobasilar insufficiency.  She has worked with neurology.    Medication Adjustments/Labs and Tests Ordered: Current medicines are reviewed at length with the patient today.  Concerns regarding medicines are outlined above.  Orders Placed This Encounter  Procedures  . EKG 12-Lead   No orders of the defined types were placed in this encounter.   Patient Instructions  Medication Instructions:  The current medical regimen is effective;  continue present plan and medications.  *If you need a refill on your cardiac medications before your next appointment, please call your pharmacy*  Follow-Up: At [facility], you and your health needs are our priority.  As part of our continuing mission to provide you with exceptional heart care, we have created designated Provider Care Teams.  These Care Teams include your primary Cardiologist (physician) and Advanced Practice Providers (APPs -  Physician Assistants and Nurse Practitioners) who all work together to provide you with the care you need, when you need it.  We recommend signing up for the patient portal called "MyChart".  Sign up information is provided on this After Visit Summary.  MyChart is used to connect with patients for Virtual Visits (Telemedicine).  Patients are able to view lab/test results, encounter notes, upcoming appointments, etc.  Non-urgent messages can be sent to your provider as well.   To learn more about what you can do with MyChart, go to [email or web address].    Your next appointment:   12 month(s)  The format for your next appointment:   In Person  Provider:   Candee Furbish, MD   Thank you for  choosing [facility]!!  Signed, Heidi Furbish, MD  03/08/2020 5:09 PM    [place]

## 2020-03-08 NOTE — Patient Instructions (Signed)
Medication Instructions:  The current medical regimen is effective;  continue present plan and medications.  *If you need a refill on your cardiac medications before your next appointment, please call your pharmacy*  Follow-Up: At [facility], you and your health needs are our priority.  As part of our continuing mission to provide you with exceptional heart care, we have created designated Provider Care Teams.  These Care Teams include your primary Cardiologist (physician) and Advanced Practice Providers (APPs -  Physician Assistants and Nurse Practitioners) who all work together to provide you with the care you need, when you need it.  We recommend signing up for the patient portal called "MyChart".  Sign up information is provided on this After Visit Summary.  MyChart is used to connect with patients for Virtual Visits (Telemedicine).  Patients are able to view lab/test results, encounter notes, upcoming appointments, etc.  Non-urgent messages can be sent to your provider as well.   To learn more about what you can do with MyChart, go to [email or web address].    Your next appointment:   12 month(s)  The format for your next appointment:   In Person  Provider:   Mark Skains, MD   Thank you for choosing [facility]!!      

## 2020-03-10 NOTE — Progress Notes (Signed)
Remote pacemaker transmission.   

## 2020-06-07 ENCOUNTER — Ambulatory Visit (INDEPENDENT_AMBULATORY_CARE_PROVIDER_SITE_OTHER): Payer: Medicare Other | Admitting: *Deleted

## 2020-06-07 DIAGNOSIS — I495 Sick sinus syndrome: Secondary | ICD-10-CM

## 2020-06-07 LAB — CUP PACEART REMOTE DEVICE CHECK
Battery Impedance: 951 Ohm
Battery Remaining Longevity: 71 mo
Battery Voltage: 2.78 V
Brady Statistic AP VP Percent: 4 %
Brady Statistic AP VS Percent: 12 %
Brady Statistic AS VP Percent: 0 %
Brady Statistic AS VS Percent: 84 %
Date Time Interrogation Session: 20210901104108
Implantable Lead Implant Date: 20070710
Implantable Lead Implant Date: 20070710
Implantable Lead Location: 753859
Implantable Lead Location: 753860
Implantable Lead Model: 4469
Implantable Lead Model: 4470
Implantable Lead Serial Number: 483166
Implantable Lead Serial Number: 541525
Implantable Pulse Generator Implant Date: 20130410
Lead Channel Impedance Value: 610 Ohm
Lead Channel Impedance Value: 713 Ohm
Lead Channel Pacing Threshold Amplitude: 0.625 V
Lead Channel Pacing Threshold Amplitude: 0.75 V
Lead Channel Pacing Threshold Pulse Width: 0.4 ms
Lead Channel Pacing Threshold Pulse Width: 0.4 ms
Lead Channel Setting Pacing Amplitude: 2 V
Lead Channel Setting Pacing Amplitude: 2.5 V
Lead Channel Setting Pacing Pulse Width: 0.4 ms
Lead Channel Setting Sensing Sensitivity: 5.6 mV

## 2020-06-09 NOTE — Progress Notes (Signed)
Remote pacemaker transmission.   

## 2020-07-29 ENCOUNTER — Other Ambulatory Visit: Payer: Self-pay | Admitting: Internal Medicine

## 2020-09-06 ENCOUNTER — Ambulatory Visit (INDEPENDENT_AMBULATORY_CARE_PROVIDER_SITE_OTHER): Payer: Medicare Other

## 2020-09-06 DIAGNOSIS — I495 Sick sinus syndrome: Secondary | ICD-10-CM | POA: Diagnosis not present

## 2020-09-06 LAB — CUP PACEART REMOTE DEVICE CHECK
Battery Impedance: 1029 Ohm
Battery Remaining Longevity: 68 mo
Battery Voltage: 2.78 V
Brady Statistic AP VP Percent: 4 %
Brady Statistic AP VS Percent: 12 %
Brady Statistic AS VP Percent: 0 %
Brady Statistic AS VS Percent: 85 %
Date Time Interrogation Session: 20211201080851
Implantable Lead Implant Date: 20070710
Implantable Lead Implant Date: 20070710
Implantable Lead Location: 753859
Implantable Lead Location: 753860
Implantable Lead Model: 4469
Implantable Lead Model: 4470
Implantable Lead Serial Number: 483166
Implantable Lead Serial Number: 541525
Implantable Pulse Generator Implant Date: 20130410
Lead Channel Impedance Value: 582 Ohm
Lead Channel Impedance Value: 645 Ohm
Lead Channel Pacing Threshold Amplitude: 0.625 V
Lead Channel Pacing Threshold Amplitude: 0.625 V
Lead Channel Pacing Threshold Pulse Width: 0.4 ms
Lead Channel Pacing Threshold Pulse Width: 0.4 ms
Lead Channel Setting Pacing Amplitude: 2 V
Lead Channel Setting Pacing Amplitude: 2.5 V
Lead Channel Setting Pacing Pulse Width: 0.4 ms
Lead Channel Setting Sensing Sensitivity: 4 mV

## 2020-09-12 ENCOUNTER — Other Ambulatory Visit: Payer: Self-pay | Admitting: Internal Medicine

## 2020-09-13 NOTE — Progress Notes (Signed)
Remote pacemaker transmission.   

## 2020-10-13 DIAGNOSIS — R2681 Unsteadiness on feet: Secondary | ICD-10-CM | POA: Diagnosis not present

## 2020-10-13 DIAGNOSIS — R262 Difficulty in walking, not elsewhere classified: Secondary | ICD-10-CM | POA: Diagnosis not present

## 2020-10-13 DIAGNOSIS — R269 Unspecified abnormalities of gait and mobility: Secondary | ICD-10-CM | POA: Diagnosis not present

## 2020-10-20 DIAGNOSIS — I5032 Chronic diastolic (congestive) heart failure: Secondary | ICD-10-CM | POA: Diagnosis not present

## 2020-10-20 DIAGNOSIS — N1831 Chronic kidney disease, stage 3a: Secondary | ICD-10-CM | POA: Diagnosis not present

## 2020-10-20 DIAGNOSIS — G4700 Insomnia, unspecified: Secondary | ICD-10-CM | POA: Diagnosis not present

## 2020-10-20 DIAGNOSIS — I10 Essential (primary) hypertension: Secondary | ICD-10-CM | POA: Diagnosis not present

## 2020-10-20 DIAGNOSIS — E782 Mixed hyperlipidemia: Secondary | ICD-10-CM | POA: Diagnosis not present

## 2020-10-20 DIAGNOSIS — H409 Unspecified glaucoma: Secondary | ICD-10-CM | POA: Diagnosis not present

## 2020-10-20 DIAGNOSIS — G450 Vertebro-basilar artery syndrome: Secondary | ICD-10-CM | POA: Diagnosis not present

## 2020-10-20 DIAGNOSIS — D649 Anemia, unspecified: Secondary | ICD-10-CM | POA: Diagnosis not present

## 2020-10-20 DIAGNOSIS — K219 Gastro-esophageal reflux disease without esophagitis: Secondary | ICD-10-CM | POA: Diagnosis not present

## 2020-10-20 DIAGNOSIS — M8580 Other specified disorders of bone density and structure, unspecified site: Secondary | ICD-10-CM | POA: Diagnosis not present

## 2020-10-20 DIAGNOSIS — E1122 Type 2 diabetes mellitus with diabetic chronic kidney disease: Secondary | ICD-10-CM | POA: Diagnosis not present

## 2020-11-01 DIAGNOSIS — R269 Unspecified abnormalities of gait and mobility: Secondary | ICD-10-CM | POA: Diagnosis not present

## 2020-11-01 DIAGNOSIS — R262 Difficulty in walking, not elsewhere classified: Secondary | ICD-10-CM | POA: Diagnosis not present

## 2020-11-01 DIAGNOSIS — R2681 Unsteadiness on feet: Secondary | ICD-10-CM | POA: Diagnosis not present

## 2020-11-06 DIAGNOSIS — I10 Essential (primary) hypertension: Secondary | ICD-10-CM | POA: Diagnosis not present

## 2020-11-10 DIAGNOSIS — R262 Difficulty in walking, not elsewhere classified: Secondary | ICD-10-CM | POA: Diagnosis not present

## 2020-11-10 DIAGNOSIS — R2681 Unsteadiness on feet: Secondary | ICD-10-CM | POA: Diagnosis not present

## 2020-11-10 DIAGNOSIS — R269 Unspecified abnormalities of gait and mobility: Secondary | ICD-10-CM | POA: Diagnosis not present

## 2020-11-15 DIAGNOSIS — R262 Difficulty in walking, not elsewhere classified: Secondary | ICD-10-CM | POA: Diagnosis not present

## 2020-11-15 DIAGNOSIS — R2681 Unsteadiness on feet: Secondary | ICD-10-CM | POA: Diagnosis not present

## 2020-11-15 DIAGNOSIS — R269 Unspecified abnormalities of gait and mobility: Secondary | ICD-10-CM | POA: Diagnosis not present

## 2020-11-22 DIAGNOSIS — R269 Unspecified abnormalities of gait and mobility: Secondary | ICD-10-CM | POA: Diagnosis not present

## 2020-11-22 DIAGNOSIS — R2681 Unsteadiness on feet: Secondary | ICD-10-CM | POA: Diagnosis not present

## 2020-11-22 DIAGNOSIS — R262 Difficulty in walking, not elsewhere classified: Secondary | ICD-10-CM | POA: Diagnosis not present

## 2020-11-24 DIAGNOSIS — G4733 Obstructive sleep apnea (adult) (pediatric): Secondary | ICD-10-CM | POA: Diagnosis not present

## 2020-11-29 DIAGNOSIS — R269 Unspecified abnormalities of gait and mobility: Secondary | ICD-10-CM | POA: Diagnosis not present

## 2020-11-29 DIAGNOSIS — R262 Difficulty in walking, not elsewhere classified: Secondary | ICD-10-CM | POA: Diagnosis not present

## 2020-11-29 DIAGNOSIS — R2681 Unsteadiness on feet: Secondary | ICD-10-CM | POA: Diagnosis not present

## 2020-12-06 ENCOUNTER — Ambulatory Visit (INDEPENDENT_AMBULATORY_CARE_PROVIDER_SITE_OTHER): Payer: Medicare Other

## 2020-12-06 DIAGNOSIS — I495 Sick sinus syndrome: Secondary | ICD-10-CM | POA: Diagnosis not present

## 2020-12-07 DIAGNOSIS — R262 Difficulty in walking, not elsewhere classified: Secondary | ICD-10-CM | POA: Diagnosis not present

## 2020-12-07 DIAGNOSIS — R2681 Unsteadiness on feet: Secondary | ICD-10-CM | POA: Diagnosis not present

## 2020-12-07 DIAGNOSIS — R269 Unspecified abnormalities of gait and mobility: Secondary | ICD-10-CM | POA: Diagnosis not present

## 2020-12-08 LAB — CUP PACEART REMOTE DEVICE CHECK
Battery Impedance: 1135 Ohm
Battery Remaining Longevity: 64 mo
Battery Voltage: 2.78 V
Brady Statistic AP VP Percent: 3 %
Brady Statistic AP VS Percent: 12 %
Brady Statistic AS VP Percent: 0 %
Brady Statistic AS VS Percent: 85 %
Date Time Interrogation Session: 20220302090200
Implantable Lead Implant Date: 20070710
Implantable Lead Implant Date: 20070710
Implantable Lead Location: 753859
Implantable Lead Location: 753860
Implantable Lead Model: 4469
Implantable Lead Model: 4470
Implantable Lead Serial Number: 483166
Implantable Lead Serial Number: 541525
Implantable Pulse Generator Implant Date: 20130410
Lead Channel Impedance Value: 611 Ohm
Lead Channel Impedance Value: 673 Ohm
Lead Channel Pacing Threshold Amplitude: 0.5 V
Lead Channel Pacing Threshold Amplitude: 0.625 V
Lead Channel Pacing Threshold Pulse Width: 0.4 ms
Lead Channel Pacing Threshold Pulse Width: 0.4 ms
Lead Channel Setting Pacing Amplitude: 2 V
Lead Channel Setting Pacing Amplitude: 2.5 V
Lead Channel Setting Pacing Pulse Width: 0.4 ms
Lead Channel Setting Sensing Sensitivity: 4 mV

## 2020-12-09 ENCOUNTER — Other Ambulatory Visit: Payer: Self-pay | Admitting: Internal Medicine

## 2020-12-11 DIAGNOSIS — T1512XA Foreign body in conjunctival sac, left eye, initial encounter: Secondary | ICD-10-CM | POA: Diagnosis not present

## 2020-12-12 DIAGNOSIS — H401432 Capsular glaucoma with pseudoexfoliation of lens, bilateral, moderate stage: Secondary | ICD-10-CM | POA: Diagnosis not present

## 2020-12-14 DIAGNOSIS — N1831 Chronic kidney disease, stage 3a: Secondary | ICD-10-CM | POA: Diagnosis not present

## 2020-12-14 DIAGNOSIS — I10 Essential (primary) hypertension: Secondary | ICD-10-CM | POA: Diagnosis not present

## 2020-12-14 DIAGNOSIS — E1122 Type 2 diabetes mellitus with diabetic chronic kidney disease: Secondary | ICD-10-CM | POA: Diagnosis not present

## 2020-12-14 DIAGNOSIS — R2681 Unsteadiness on feet: Secondary | ICD-10-CM | POA: Diagnosis not present

## 2020-12-14 DIAGNOSIS — D649 Anemia, unspecified: Secondary | ICD-10-CM | POA: Diagnosis not present

## 2020-12-14 DIAGNOSIS — I5032 Chronic diastolic (congestive) heart failure: Secondary | ICD-10-CM | POA: Diagnosis not present

## 2020-12-14 DIAGNOSIS — E039 Hypothyroidism, unspecified: Secondary | ICD-10-CM | POA: Diagnosis not present

## 2020-12-14 DIAGNOSIS — G4700 Insomnia, unspecified: Secondary | ICD-10-CM | POA: Diagnosis not present

## 2020-12-14 DIAGNOSIS — R262 Difficulty in walking, not elsewhere classified: Secondary | ICD-10-CM | POA: Diagnosis not present

## 2020-12-14 DIAGNOSIS — I480 Paroxysmal atrial fibrillation: Secondary | ICD-10-CM | POA: Diagnosis not present

## 2020-12-14 DIAGNOSIS — H409 Unspecified glaucoma: Secondary | ICD-10-CM | POA: Diagnosis not present

## 2020-12-14 DIAGNOSIS — R269 Unspecified abnormalities of gait and mobility: Secondary | ICD-10-CM | POA: Diagnosis not present

## 2020-12-14 DIAGNOSIS — E782 Mixed hyperlipidemia: Secondary | ICD-10-CM | POA: Diagnosis not present

## 2020-12-14 DIAGNOSIS — G450 Vertebro-basilar artery syndrome: Secondary | ICD-10-CM | POA: Diagnosis not present

## 2020-12-14 DIAGNOSIS — I2510 Atherosclerotic heart disease of native coronary artery without angina pectoris: Secondary | ICD-10-CM | POA: Diagnosis not present

## 2020-12-14 NOTE — Progress Notes (Signed)
Remote pacemaker transmission.   

## 2020-12-18 DIAGNOSIS — R413 Other amnesia: Secondary | ICD-10-CM | POA: Diagnosis not present

## 2020-12-18 DIAGNOSIS — I5032 Chronic diastolic (congestive) heart failure: Secondary | ICD-10-CM | POA: Diagnosis not present

## 2020-12-18 DIAGNOSIS — H8110 Benign paroxysmal vertigo, unspecified ear: Secondary | ICD-10-CM | POA: Diagnosis not present

## 2020-12-18 DIAGNOSIS — E039 Hypothyroidism, unspecified: Secondary | ICD-10-CM | POA: Diagnosis not present

## 2020-12-18 DIAGNOSIS — E1122 Type 2 diabetes mellitus with diabetic chronic kidney disease: Secondary | ICD-10-CM | POA: Diagnosis not present

## 2020-12-18 DIAGNOSIS — G450 Vertebro-basilar artery syndrome: Secondary | ICD-10-CM | POA: Diagnosis not present

## 2020-12-18 DIAGNOSIS — I480 Paroxysmal atrial fibrillation: Secondary | ICD-10-CM | POA: Diagnosis not present

## 2020-12-18 DIAGNOSIS — I10 Essential (primary) hypertension: Secondary | ICD-10-CM | POA: Diagnosis not present

## 2020-12-18 DIAGNOSIS — N1831 Chronic kidney disease, stage 3a: Secondary | ICD-10-CM | POA: Diagnosis not present

## 2020-12-18 DIAGNOSIS — I495 Sick sinus syndrome: Secondary | ICD-10-CM | POA: Diagnosis not present

## 2020-12-18 DIAGNOSIS — G629 Polyneuropathy, unspecified: Secondary | ICD-10-CM | POA: Diagnosis not present

## 2020-12-21 DIAGNOSIS — R269 Unspecified abnormalities of gait and mobility: Secondary | ICD-10-CM | POA: Diagnosis not present

## 2020-12-21 DIAGNOSIS — R2681 Unsteadiness on feet: Secondary | ICD-10-CM | POA: Diagnosis not present

## 2020-12-21 DIAGNOSIS — R262 Difficulty in walking, not elsewhere classified: Secondary | ICD-10-CM | POA: Diagnosis not present

## 2020-12-25 ENCOUNTER — Emergency Department (HOSPITAL_COMMUNITY): Payer: Medicare Other

## 2020-12-25 ENCOUNTER — Encounter: Payer: Medicare Other | Admitting: Internal Medicine

## 2020-12-25 ENCOUNTER — Other Ambulatory Visit: Payer: Self-pay

## 2020-12-25 ENCOUNTER — Emergency Department (HOSPITAL_COMMUNITY)
Admission: EM | Admit: 2020-12-25 | Discharge: 2020-12-26 | Disposition: A | Discharge status: Home / Self Care | Payer: Medicare Other | Attending: Emergency Medicine | Admitting: Emergency Medicine

## 2020-12-25 ENCOUNTER — Encounter (HOSPITAL_COMMUNITY): Payer: Self-pay | Admitting: Emergency Medicine

## 2020-12-25 DIAGNOSIS — R2689 Other abnormalities of gait and mobility: Secondary | ICD-10-CM | POA: Diagnosis not present

## 2020-12-25 DIAGNOSIS — N183 Chronic kidney disease, stage 3 unspecified: Secondary | ICD-10-CM | POA: Diagnosis not present

## 2020-12-25 DIAGNOSIS — Z87891 Personal history of nicotine dependence: Secondary | ICD-10-CM | POA: Insufficient documentation

## 2020-12-25 DIAGNOSIS — W19XXXA Unspecified fall, initial encounter: Secondary | ICD-10-CM

## 2020-12-25 DIAGNOSIS — I480 Paroxysmal atrial fibrillation: Secondary | ICD-10-CM

## 2020-12-25 DIAGNOSIS — I6529 Occlusion and stenosis of unspecified carotid artery: Secondary | ICD-10-CM | POA: Diagnosis not present

## 2020-12-25 DIAGNOSIS — S0512XA Contusion of eyeball and orbital tissues, left eye, initial encounter: Secondary | ICD-10-CM | POA: Diagnosis not present

## 2020-12-25 DIAGNOSIS — Z043 Encounter for examination and observation following other accident: Secondary | ICD-10-CM | POA: Diagnosis not present

## 2020-12-25 DIAGNOSIS — W01198A Fall on same level from slipping, tripping and stumbling with subsequent striking against other object, initial encounter: Secondary | ICD-10-CM | POA: Insufficient documentation

## 2020-12-25 DIAGNOSIS — R404 Transient alteration of awareness: Secondary | ICD-10-CM | POA: Diagnosis not present

## 2020-12-25 DIAGNOSIS — Z7984 Long term (current) use of oral hypoglycemic drugs: Secondary | ICD-10-CM | POA: Insufficient documentation

## 2020-12-25 DIAGNOSIS — E1122 Type 2 diabetes mellitus with diabetic chronic kidney disease: Secondary | ICD-10-CM | POA: Insufficient documentation

## 2020-12-25 DIAGNOSIS — S0181XA Laceration without foreign body of other part of head, initial encounter: Secondary | ICD-10-CM | POA: Diagnosis not present

## 2020-12-25 DIAGNOSIS — Y92531 Health care provider office as the place of occurrence of the external cause: Secondary | ICD-10-CM | POA: Diagnosis not present

## 2020-12-25 DIAGNOSIS — E039 Hypothyroidism, unspecified: Secondary | ICD-10-CM | POA: Diagnosis not present

## 2020-12-25 DIAGNOSIS — Z79899 Other long term (current) drug therapy: Secondary | ICD-10-CM | POA: Diagnosis not present

## 2020-12-25 DIAGNOSIS — R42 Dizziness and giddiness: Secondary | ICD-10-CM | POA: Diagnosis not present

## 2020-12-25 DIAGNOSIS — R2681 Unsteadiness on feet: Secondary | ICD-10-CM

## 2020-12-25 DIAGNOSIS — R6889 Other general symptoms and signs: Secondary | ICD-10-CM | POA: Diagnosis not present

## 2020-12-25 DIAGNOSIS — Z7982 Long term (current) use of aspirin: Secondary | ICD-10-CM | POA: Insufficient documentation

## 2020-12-25 DIAGNOSIS — M25532 Pain in left wrist: Secondary | ICD-10-CM | POA: Diagnosis not present

## 2020-12-25 DIAGNOSIS — Z950 Presence of cardiac pacemaker: Secondary | ICD-10-CM | POA: Diagnosis not present

## 2020-12-25 DIAGNOSIS — I2510 Atherosclerotic heart disease of native coronary artery without angina pectoris: Secondary | ICD-10-CM | POA: Diagnosis not present

## 2020-12-25 DIAGNOSIS — Z743 Need for continuous supervision: Secondary | ICD-10-CM | POA: Diagnosis not present

## 2020-12-25 DIAGNOSIS — R58 Hemorrhage, not elsewhere classified: Secondary | ICD-10-CM | POA: Diagnosis not present

## 2020-12-25 DIAGNOSIS — I495 Sick sinus syndrome: Secondary | ICD-10-CM

## 2020-12-25 DIAGNOSIS — I5032 Chronic diastolic (congestive) heart failure: Secondary | ICD-10-CM | POA: Diagnosis not present

## 2020-12-25 DIAGNOSIS — Y9301 Activity, walking, marching and hiking: Secondary | ICD-10-CM | POA: Insufficient documentation

## 2020-12-25 DIAGNOSIS — S01112A Laceration without foreign body of left eyelid and periocular area, initial encounter: Secondary | ICD-10-CM | POA: Diagnosis not present

## 2020-12-25 DIAGNOSIS — S0990XA Unspecified injury of head, initial encounter: Secondary | ICD-10-CM | POA: Diagnosis not present

## 2020-12-25 DIAGNOSIS — Z23 Encounter for immunization: Secondary | ICD-10-CM | POA: Diagnosis not present

## 2020-12-25 DIAGNOSIS — I130 Hypertensive heart and chronic kidney disease with heart failure and stage 1 through stage 4 chronic kidney disease, or unspecified chronic kidney disease: Secondary | ICD-10-CM | POA: Insufficient documentation

## 2020-12-25 LAB — BASIC METABOLIC PANEL
Anion gap: 9 (ref 5–15)
BUN: 18 mg/dL (ref 8–23)
CO2: 27 mmol/L (ref 22–32)
Calcium: 10.6 mg/dL — ABNORMAL HIGH (ref 8.9–10.3)
Chloride: 103 mmol/L (ref 98–111)
Creatinine, Ser: 0.91 mg/dL (ref 0.44–1.00)
GFR, Estimated: >60 mL/min (ref >60)
Glucose, Bld: 131 mg/dL — ABNORMAL HIGH (ref 70–99)
Potassium: 4 mmol/L (ref 3.5–5.1)
Sodium: 139 mmol/L (ref 135–145)

## 2020-12-25 LAB — URINALYSIS, ROUTINE W REFLEX MICROSCOPIC
Bilirubin Urine: NEGATIVE
Glucose, UA: NEGATIVE mg/dL
Hgb urine dipstick: NEGATIVE
Ketones, ur: NEGATIVE mg/dL
Leukocytes,Ua: NEGATIVE
Nitrite: NEGATIVE
Protein, ur: NEGATIVE mg/dL
Specific Gravity, Urine: 1.011 (ref 1.005–1.030)
pH: 6 (ref 5.0–8.0)

## 2020-12-25 LAB — CBC
HCT: 42 % (ref 36.0–46.0)
Hemoglobin: 13.4 g/dL (ref 12.0–15.0)
MCH: 28.4 pg (ref 26.0–34.0)
MCHC: 31.9 g/dL (ref 30.0–36.0)
MCV: 89 fL (ref 80.0–100.0)
Platelets: 256 10*3/uL (ref 150–400)
RBC: 4.72 MIL/uL (ref 3.87–5.11)
RDW: 12.6 % (ref 11.5–15.5)
WBC: 6.2 10*3/uL (ref 4.0–10.5)
nRBC: 0 % (ref 0.0–0.2)

## 2020-12-25 MED ORDER — IOHEXOL 350 MG/ML SOLN
80.0000 mL | Freq: Once | INTRAVENOUS | Status: AC | PRN
  Administered 2020-12-25: 80 mL via INTRAVENOUS

## 2020-12-25 MED ORDER — ACETAMINOPHEN 325 MG PO TABS
650.0000 mg | ORAL_TABLET | Freq: Once | ORAL | Status: AC
  Administered 2020-12-25: 650 mg via ORAL
  Filled 2020-12-25: qty 2

## 2020-12-25 MED ORDER — TETANUS-DIPHTH-ACELL PERTUSSIS 5-2.5-18.5 LF-MCG/0.5 IM SUSY
0.5000 mL | PREFILLED_SYRINGE | Freq: Once | INTRAMUSCULAR | Status: AC
  Administered 2020-12-25: 0.5 mL via INTRAMUSCULAR
  Filled 2020-12-25: qty 0.5

## 2020-12-25 MED ORDER — LIDOCAINE-EPINEPHRINE 1 %-1:100000 IJ SOLN
10.0000 mL | Freq: Once | INTRAMUSCULAR | Status: AC
  Administered 2020-12-25: 10 mL
  Filled 2020-12-25: qty 1

## 2020-12-25 NOTE — Discharge Instructions (Addendum)
The testing today does not show any serious problems.  There were no signs of serious injury.  The imaging of the arteries of the neck and head showed only some mild carotid stenosis.  Continue taking your usual medications and call your neurologist for follow-up appointment.  For the wound of the face keep it clean and had the stitches removed in 5 days.  Return here, if needed for worsening symptoms or other concerns

## 2020-12-25 NOTE — ED Triage Notes (Addendum)
Arrived via EMS from [place] office. Patient having dizziness for 2 weeks fell twice and the 3rd time was today in the lobby of the doctor's office. Patient remembers event and has a laceration left forehead bleeding controlled with bandage. EMS applied c-collar prior to arrival. Alert answering and following commands appropriate. States left wrist pain achy sore radial pulse +2.

## 2020-12-25 NOTE — ED Provider Notes (Signed)
[facility] EMERGENCY DEPARTMENT Provider Note   CSN: [id number] Arrival date & time: 12/25/20  1437     History Chief Complaint  Patient presents with  . Near Syncope  . Laceration    Heidi [name] is a 80 y.o. female.  HPI She presents for evaluation of injury from fall.  She was walking into her doctor's office to be evaluated for possible pacemaker dysfunction, when she felt dizziness, that made it difficult to walk, and fell striking her forehead on the ground.  There was no reported loss of consciousness.  She was transferred here, by EMS for evaluation wearing a cervical collar.  She has had several other falls in the last 2 weeks each 1 preceded by dizziness.  The dizziness is not persistent.  She denies other symptoms such as fever, chills, cough, shortness of breath, nausea or vomiting.  She is taking her usual medications as prescribed.  Last week, her Elavil dose was increased from 2-1/2 to 25 mg, for worsening depression.  She has previously been on the higher dose of Elavil.  Unknown tetanus status.  She recently had a stitch removed following a corneal procedure from her left eye.  No other known active modifying factors.    Past Medical History:  Diagnosis Date  . Anxiety   . Atrial fibrillation (HCC)    Intermittent  . CAD (coronary artery disease)    non obstructive  . CKD (chronic kidney disease)   . Diabetes mellitus without complication ([place])   . Dysrhythmia    PAF  . GERD (gastroesophageal reflux disease)   . Glaucoma    [place]  . HTN (hypertension)   . Hyperlipidemia   . Hypothyroid   . Major depression    10-17-2010: hospitalized for suicidal, [name] (D/C 10-30-2010)  . Migraine headache   . OSA (obstructive sleep apnea)    CPAP nightly  . Peripheral neuropathy   . Seasonal allergies   . Sleep apnea   . 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 HLD (hyperlipidemia) 05/30/2017  . GERD (gastroesophageal reflux disease) 05/30/2017  . UTI (urinary tract infection) 05/30/2017  . Acute metabolic encephalopathy 36/14/4315  . Type II diabetes mellitus with renal manifestations ([place]) 05/30/2017  . Hypothyroidism 05/30/2017  . Depression 05/30/2017  . CAD (coronary artery disease) 05/30/2017  . CKD (chronic kidney disease), stage III ([place]) 05/30/2017  . Chronic diastolic CHF (congestive heart failure) ([place]) 05/30/2017  . Dementia ([place]) 05/30/2017  . Essential hypertension, benign 06/15/2014  . Allergic reaction 04/28/2012  . Tachycardia-bradycardia ([place]) 12/24/2011  . 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 HTN (hypertension) 12/24/2011  . A-fib ([place]) 11/07/2011    Past Surgical History:  Procedure Laterality Date  . ABDOMINAL ADHESION SURGERY  2008   exploratory to remove attached to the abdominal wall, stomach and intestines.   . ABDOMINAL HYSTERECTOMY  1976  . APPENDECTOMY    . BREAST EXCISIONAL BIOPSY    . BREAST SURGERY     fibroids removed, benign  . CARDIAC CATHETERIZATION  02-12-2006   Minor irregularities: RCA-mid 40%, LM-normal, LAD-normal. EF 65%.  . CHOLECYSTECTOMY    . ECTOPIC PREGNANCY SURGERY     treatment x3, last one 11-02-2009 at [facility]  . goiter    . goiter removed    . MASS EXCISION Right 10/18/2015   Procedure: EXCISION OF 6CM MASS ON RIGHT  NECK/BACK;  Surgeon: [name], MD;  Location: [facility];  Service: General;  Laterality: Right;  . PACEMAKER GENERATOR CHANGE N/A 01/15/2012   Procedure: PACEMAKER GENERATOR CHANGE;  Surgeon: Deboraha Sprang, MD;  Location: [facility] CATH LAB;  Service: Cardiovascular;  Laterality: N/A;  . PACEMAKER INSERTION     Permanent. [employer] EnRhyth 04/2006, set as DDDR  . TUMOR REMOVAL  2000   Fibroid from breast x4-most recent  . WRIST FRACTURE SURGERY  11-2007   right, had metal plate inserted     OB History   No obstetric history on file.      Family History  Problem Relation Age of Onset  . Stomach cancer Father   . Atrial fibrillation Brother     Social History   Tobacco Use  . Smoking status: Former Research scientist (life sciences)  . Smokeless tobacco: Never Used  Vaping Use  . Vaping Use: Never used  Substance Use Topics  . Alcohol use: No  . Drug use: No    Home Medications Prior to Admission medications   Medication Sig Start Date End Date Taking? Authorizing Provider  acetaminophen (TYLENOL) 500 MG tablet Take 1,000 mg by mouth daily as needed for headache (pain).   Yes [provider]  amLODipine (NORVASC) 2.5 MG tablet Take 2.5 mg by mouth at bedtime. 12/14/20  Yes [provider]  aspirin EC 81 MG tablet Take 81 mg by mouth at bedtime. 12/24/11  Yes Deboraha Sprang, MD  benazepril (LOTENSIN) 10 MG tablet Take 10 mg by mouth daily with breakfast.   Yes [provider]  cetirizine (ZYRTEC) 10 MG tablet Take 10 mg by mouth daily with breakfast.   Yes [provider]  Cholecalciferol (VITAMIN D-3) 125 MCG (5000 UT) TABS Take 5,000 Units by mouth daily with lunch.   Yes [provider]  cholestyramine (QUESTRAN) 4 g packet Take 4 g by mouth 2 (two) times daily as needed (diarrhea). 08/13/18  Yes [provider]  fenofibrate 160 MG tablet Take 160 mg by mouth at bedtime. Take with food 12/30/18  Yes [provider]  ferrous sulfate 325 (65 FE) MG tablet Take 325 mg by mouth daily with lunch.   Yes [provider]  fluticasone (FLONASE) 50 MCG/ACT nasal spray Place 2 sprays into the nose daily as needed for allergies.    Yes [provider]  furosemide (LASIX) 20 MG tablet TAKE 1 TABLET BY MOUTH THREE TIMES WEEKLY. PLEASE MAKE YEARLY APPT WITH DISCARD REMAINDER. [place] 2022 Patient taking differently: Take 20 mg by mouth See admin instructions. Take one tablet (20 mg) by mouth three times weekly - Monday, Wednesday, Saturday 12/11/20  Yes Jerline Pain,  MD  gabapentin (NEURONTIN) 300 MG capsule Take 300 mg by mouth 3 (three) times daily.   Yes [provider]  galantamine (RAZADYNE ER) 8 MG 24 hr capsule Take 8 mg by mouth daily with breakfast. 08/13/18  Yes [provider]  hydrOXYzine (ATARAX/VISTARIL) 25 MG tablet Take 50 mg by mouth at bedtime.   Yes [provider]  levothyroxine (SYNTHROID, LEVOTHROID) 75 MCG tablet Take 75 mcg by mouth daily before breakfast.   Yes [provider]  metFORMIN (GLUCOPHAGE-XR) 500 MG 24 hr tablet Take 500 mg by mouth in the morning and at bedtime. 10/05/20  Yes [provider]  metoprolol succinate (TOPROL-XL) 100 MG 24 hr tablet TAKE 1 TABLET(100 MG) BY MOUTH DAILY Patient taking differently: Take 100 mg by mouth daily with  breakfast. 07/31/20  Yes Deboraha Sprang, MD  Multiple Vitamin (MULTIVITAMIN WITH MINERALS) TABS tablet Take 1 tablet by mouth daily with breakfast.   Yes [provider]  nortriptyline (PAMELOR) 25 MG capsule Take 25 mg by mouth at bedtime. Take with a 75 mg capsule for a total dose of 100 mg nightly 12/05/20  Yes [provider]  nortriptyline (PAMELOR) 75 MG capsule Take 75 mg by mouth at bedtime. Take with a 25 mg capsule for a total dose of 100 mg nightly   Yes [provider]  pantoprazole (PROTONIX) 40 MG tablet Take 40 mg by mouth See admin instructions. Take one tablet (40 mg) by mouth twice daily - lunch and bedtime 06/01/18  Yes [provider]  [place] into the lungs at bedtime. CPAP   Yes [provider]  spironolactone (ALDACTONE) 25 MG tablet Take 1 tablet (25 mg total) by mouth daily. Patient taking differently: Take 25 mg by mouth at bedtime. 10/03/17  Yes Deboraha Sprang, MD  vitamin C (ASCORBIC ACID) 500 MG tablet Take 500 mg by mouth daily with lunch.   Yes [provider]  ofloxacin (OCUFLOX) 0.3 % ophthalmic solution Place 1 drop into the left eye 4  (four) times daily.    [provider]    Allergies    Crestor [rosuvastatin calcium], Effexor [venlafaxine hydrochloride], Hibiclens [chlorhexidine gluconate], Nardil, Penicillins, Adhesive [tape], Clopidogrel, Lipitor [atorvastatin calcium], Plavix [clopidogrel bisulfate], Simvastatin, Sulfonamide derivatives, and Zestril [lisinopril]  Review of Systems   Review of Systems  All other systems reviewed and are negative.   Physical Exam Updated Vital Signs BP (!) 148/67 (BP Location: Right Arm)   Pulse 72   Temp 97.8 F (36.6 C)   Resp 14   Ht 5\' 1"  (1.549 m)   Wt 64.4 kg   SpO2 100%   BMI 26.83 kg/m   Physical Exam Vitals and nursing note reviewed.  Constitutional:      General: She is not in acute distress.    Appearance: She is well-developed. She is obese. She is not ill-appearing, toxic-appearing or diaphoretic.  HENT:     Head: Normocephalic.     Comments: Contusion laceration, left forehead with mild periorbital swelling and bruising.  Mild scleral redness, from recent ophthalmologic procedure.  No drainage from the left eye.  External ocular muscles are intact    Right Ear: External ear normal.     Left Ear: External ear normal.     Mouth/Throat:     Pharynx: No oropharyngeal exudate.  Eyes:     Extraocular Movements: Extraocular movements intact.     Conjunctiva/sclera: Conjunctivae normal.     Pupils: Pupils are equal, round, and reactive to light.     Comments: Left eye scleral blood present, laterally, no drainage or active bleeding  Neck:     Trachea: Phonation normal.  Cardiovascular:     Rate and Rhythm: Normal rate and regular rhythm.     Heart sounds: Normal heart sounds.  Pulmonary:     Effort: Pulmonary effort is normal.     Breath sounds: Normal breath sounds.  Abdominal:     Palpations: Abdomen is soft.     Tenderness: There is no abdominal tenderness.  Musculoskeletal:        General: Normal range of motion.     Cervical back: Normal  range of motion and neck supple.  Skin:    General: Skin is warm and dry.  Comments: Curvilinear laceration, left forehead, gaping somewhat, not actively bleeding.  Neurological:     Mental Status: She is alert and oriented to person, place, and time.     Cranial Nerves: No cranial nerve deficit.     Sensory: No sensory deficit.     Motor: No abnormal muscle tone.     Coordination: Coordination normal.  Psychiatric:        Mood and Affect: Mood normal.        Behavior: Behavior normal.        Thought Content: Thought content normal.        Judgment: Judgment normal.     ED Results / Procedures / Treatments   Labs (all labs ordered are listed, but only abnormal results are displayed) Labs Reviewed  BASIC METABOLIC PANEL - Abnormal; Notable for the following components:      Result Value   Glucose, Bld 131 (*)    Calcium 10.6 (*)    All other components within normal limits  CBC  URINALYSIS, ROUTINE W REFLEX MICROSCOPIC  CBG MONITORING, ED    EKG EKG Interpretation  Date/Time:  Monday December 25 2020 15:04:43 EDT Ventricular Rate:  93 PR Interval:  192 QRS Duration: 110 QT Interval:  348 QTC Calculation: 432 R Axis:   -39 Text Interpretation: Normal sinus rhythm Left axis deviation Left ventricular hypertrophy with repolarization abnormality ( R in aVL , Cornell product , Romhilt-Estes ) Abnormal ECG Since last tracing repolarization abnormality is new Otherwise no significant change Confirmed by Daleen Bo [phone]) on 12/25/2020 4:38:01 PM   Radiology CT Angio Head W or Wo Contrast  Result Date: 12/25/2020 CLINICAL DATA:  Dizziness EXAM: CT ANGIOGRAPHY HEAD AND NECK TECHNIQUE: Multidetector CT imaging of the head and neck was performed using the standard protocol during bolus administration of intravenous contrast. Multiplanar CT image reconstructions and MIPs were obtained to evaluate the vascular anatomy. Carotid stenosis measurements (when applicable) are obtained  utilizing NASCET criteria, using the distal internal carotid diameter as the denominator. CONTRAST:  23mL OMNIPAQUE IOHEXOL 350 MG/ML SOLN COMPARISON:  None. FINDINGS: CTA NECK FINDINGS SKELETON: There is no bony spinal canal stenosis. No lytic or blastic lesion. OTHER NECK: Normal pharynx, larynx and major salivary glands. No cervical lymphadenopathy. Unremarkable thyroid gland. UPPER CHEST: No pneumothorax or pleural effusion. No nodules or masses. AORTIC ARCH: There is calcific atherosclerosis of the aortic arch. There is no aneurysm, dissection or hemodynamically significant stenosis of the visualized portion of the aorta. Conventional 3 vessel aortic branching pattern. The visualized proximal subclavian arteries are widely patent. RIGHT CAROTID SYSTEM: No dissection, occlusion or aneurysm. There is mixed density atherosclerosis extending into the proximal ICA, resulting in 50% stenosis. LEFT CAROTID SYSTEM: No dissection, occlusion or aneurysm. There is mixed density atherosclerosis extending into the proximal ICA, resulting in less than 50% stenosis. VERTEBRAL ARTERIES: Left dominant configuration. Both origins are clearly patent. There is no dissection, occlusion or flow-limiting stenosis to the skull base (V1-V3 segments). CTA HEAD FINDINGS POSTERIOR CIRCULATION: --Vertebral arteries: Normal V4 segments. --Inferior cerebellar arteries: Normal. --Basilar artery: Normal. --Superior cerebellar arteries: Normal. --Posterior cerebral arteries (PCA): Normal. ANTERIOR CIRCULATION: --Intracranial internal carotid arteries: Atherosclerotic calcification of the internal carotid arteries at the skull base without hemodynamically significant stenosis. --Anterior cerebral arteries (ACA): Normal. Both A1 segments are present. Patent anterior communicating artery (a-comm). --Middle cerebral arteries (MCA): Normal. VENOUS SINUSES: As permitted by contrast timing, patent. ANATOMIC VARIANTS: Fetal origins of both posterior  cerebral arteries. Review of the MIP  images confirms the above findings. IMPRESSION: 1. No intracranial arterial occlusion or high-grade stenosis. 2. Bilateral carotid bifurcation atherosclerosis with approximately 50% stenosis of the proximal right internal carotid artery and less than 50% stenosis of the proximal left internal carotid artery. Aortic Atherosclerosis (ICD10-I70.0). Electronically Signed   By: Ulyses Jarred M.D.   On: 12/25/2020 23:15   DG Wrist Complete Left  Result Date: 12/25/2020 CLINICAL DATA:  Recent fall with wrist pain, initial encounter EXAM: LEFT WRIST - COMPLETE 3+ VIEW COMPARISON:  None. FINDINGS: There is no evidence of fracture or dislocation. There is no evidence of arthropathy or other focal bone abnormality. Soft tissues are unremarkable. IMPRESSION: No acute abnormality noted. Electronically Signed   By: Inez Catalina M.D.   On: 12/25/2020 15:53   CT Head Wo Contrast  Result Date: 12/25/2020 CLINICAL DATA:  Fall. EXAM: CT HEAD WITHOUT CONTRAST CT CERVICAL SPINE WITHOUT CONTRAST TECHNIQUE: Multidetector CT imaging of the head and cervical spine was performed following the standard protocol without intravenous contrast. Multiplanar CT image reconstructions of the cervical spine were also generated. COMPARISON:  CTA head and neck dated October 16, 2018. CT head dated May 30, 2017. FINDINGS: CT HEAD FINDINGS Brain: No evidence of acute infarction, hemorrhage, hydrocephalus, extra-axial collection or mass lesion/mass effect. Stable mild atrophy and chronic microvascular ischemic changes. Vascular: Atherosclerotic vascular calcification of the carotid siphons. No hyperdense vessel. Skull: Normal. Negative for fracture or focal lesion. Sinuses/Orbits: Small left periorbital hematoma and laceration. Sinuses and mastoid air cells are clear. Other: None. CT CERVICAL SPINE FINDINGS Alignment: No traumatic malalignment. Trace facet mediated anterolisthesis at C4-C5 and C7-T1. Skull  base and vertebrae: No acute fracture. No primary bone lesion or focal pathologic process. Soft tissues and spinal canal: No prevertebral fluid or swelling. No visible canal hematoma. Disc levels: Moderate disc height loss from C5-C6 through C7-T1. Advanced facet arthropathy on the left at C3-C4 and C4-C5 and on the right at C7-T1. Upper chest: Biapical pleuroparenchymal scarring. 3 mm subpleural nodule in the left upper lobe, unchanged since January 2020, benign. Other: None. IMPRESSION: 1. No acute intracranial abnormality. Small left periorbital hematoma and laceration. 2. No acute cervical spine fracture or traumatic listhesis. Electronically Signed   By: Titus Dubin M.D.   On: 12/25/2020 16:33   CT Angio Neck W and/or Wo Contrast  Result Date: 12/25/2020 CLINICAL DATA:  Dizziness EXAM: CT ANGIOGRAPHY HEAD AND NECK TECHNIQUE: Multidetector CT imaging of the head and neck was performed using the standard protocol during bolus administration of intravenous contrast. Multiplanar CT image reconstructions and MIPs were obtained to evaluate the vascular anatomy. Carotid stenosis measurements (when applicable) are obtained utilizing NASCET criteria, using the distal internal carotid diameter as the denominator. CONTRAST:  74mL OMNIPAQUE IOHEXOL 350 MG/ML SOLN COMPARISON:  None. FINDINGS: CTA NECK FINDINGS SKELETON: There is no bony spinal canal stenosis. No lytic or blastic lesion. OTHER NECK: Normal pharynx, larynx and major salivary glands. No cervical lymphadenopathy. Unremarkable thyroid gland. UPPER CHEST: No pneumothorax or pleural effusion. No nodules or masses. AORTIC ARCH: There is calcific atherosclerosis of the aortic arch. There is no aneurysm, dissection or hemodynamically significant stenosis of the visualized portion of the aorta. Conventional 3 vessel aortic branching pattern. The visualized proximal subclavian arteries are widely patent. RIGHT CAROTID SYSTEM: No dissection, occlusion or  aneurysm. There is mixed density atherosclerosis extending into the proximal ICA, resulting in 50% stenosis. LEFT CAROTID SYSTEM: No dissection, occlusion or aneurysm. There is mixed density atherosclerosis extending into the  proximal ICA, resulting in less than 50% stenosis. VERTEBRAL ARTERIES: Left dominant configuration. Both origins are clearly patent. There is no dissection, occlusion or flow-limiting stenosis to the skull base (V1-V3 segments). CTA HEAD FINDINGS POSTERIOR CIRCULATION: --Vertebral arteries: Normal V4 segments. --Inferior cerebellar arteries: Normal. --Basilar artery: Normal. --Superior cerebellar arteries: Normal. --Posterior cerebral arteries (PCA): Normal. ANTERIOR CIRCULATION: --Intracranial internal carotid arteries: Atherosclerotic calcification of the internal carotid arteries at the skull base without hemodynamically significant stenosis. --Anterior cerebral arteries (ACA): Normal. Both A1 segments are present. Patent anterior communicating artery (a-comm). --Middle cerebral arteries (MCA): Normal. VENOUS SINUSES: As permitted by contrast timing, patent. ANATOMIC VARIANTS: Fetal origins of both posterior cerebral arteries. Review of the MIP images confirms the above findings. IMPRESSION: 1. No intracranial arterial occlusion or high-grade stenosis. 2. Bilateral carotid bifurcation atherosclerosis with approximately 50% stenosis of the proximal right internal carotid artery and less than 50% stenosis of the proximal left internal carotid artery. Aortic Atherosclerosis (ICD10-I70.0). Electronically Signed   By: Ulyses Jarred M.D.   On: 12/25/2020 23:15   CT Cervical Spine Wo Contrast  Result Date: 12/25/2020 CLINICAL DATA:  Fall. EXAM: CT HEAD WITHOUT CONTRAST CT CERVICAL SPINE WITHOUT CONTRAST TECHNIQUE: Multidetector CT imaging of the head and cervical spine was performed following the standard protocol without intravenous contrast. Multiplanar CT image reconstructions of the  cervical spine were also generated. COMPARISON:  CTA head and neck dated October 16, 2018. CT head dated May 30, 2017. FINDINGS: CT HEAD FINDINGS Brain: No evidence of acute infarction, hemorrhage, hydrocephalus, extra-axial collection or mass lesion/mass effect. Stable mild atrophy and chronic microvascular ischemic changes. Vascular: Atherosclerotic vascular calcification of the carotid siphons. No hyperdense vessel. Skull: Normal. Negative for fracture or focal lesion. Sinuses/Orbits: Small left periorbital hematoma and laceration. Sinuses and mastoid air cells are clear. Other: None. CT CERVICAL SPINE FINDINGS Alignment: No traumatic malalignment. Trace facet mediated anterolisthesis at C4-C5 and C7-T1. Skull base and vertebrae: No acute fracture. No primary bone lesion or focal pathologic process. Soft tissues and spinal canal: No prevertebral fluid or swelling. No visible canal hematoma. Disc levels: Moderate disc height loss from C5-C6 through C7-T1. Advanced facet arthropathy on the left at C3-C4 and C4-C5 and on the right at C7-T1. Upper chest: Biapical pleuroparenchymal scarring. 3 mm subpleural nodule in the left upper lobe, unchanged since January 2020, benign. Other: None. IMPRESSION: 1. No acute intracranial abnormality. Small left periorbital hematoma and laceration. 2. No acute cervical spine fracture or traumatic listhesis. Electronically Signed   By: Titus Dubin M.D.   On: 12/25/2020 16:33    Procedures .Marland KitchenLaceration Repair  Date/Time: 12/25/2020 6:24 PM Performed by: Daleen Bo, MD Authorized by: Daleen Bo, MD   Consent:    Consent obtained:  Verbal   Consent given by:  Patient   Risks, benefits, and alternatives were discussed: yes     Risks discussed:  Pain, need for additional repair, infection and poor cosmetic result   Alternatives discussed:  No treatment Universal protocol:    Procedure explained and questions answered to patient or proxy's satisfaction: yes      Immediately prior to procedure, a time out was called: yes     Patient identity confirmed:  Verbally with patient Anesthesia:    Anesthesia method:  Local infiltration   Local anesthetic:  Lidocaine 2% WITH epi Laceration details:    Location:  Face   Face location:  Forehead   Length (cm):  2   Depth (mm):  8 Pre-procedure details:  Preparation:  Patient was prepped and draped in usual sterile fashion and imaging obtained to evaluate for foreign bodies Exploration:    Limited defect created (wound extended): yes     Hemostasis achieved with:  Direct pressure   Wound exploration: wound explored through full range of motion     Wound extent: no areolar tissue violation noted, no fascia violation noted, no foreign bodies/material noted, no muscle damage noted, no nerve damage noted, no underlying fracture noted and no vascular damage noted     Contaminated: no   Treatment:    Area cleansed with:  Povidone-iodine   Amount of cleaning:  Standard   Irrigation solution:  Sterile water   Irrigation method:  Syringe   Debridement:  None   Undermining:  None   Scar revision: no   Skin repair:    Repair method:  Sutures   Suture size:  6-0   Suture material:  Prolene   Number of sutures:  4 Approximation:    Approximation:  Loose Repair type:    Repair type:  Simple Post-procedure details:    Dressing:  Non-adherent dressing   Procedure completion:  Tolerated well, no immediate complications     Medications Ordered in ED Medications  acetaminophen (TYLENOL) tablet 650 mg (650 mg Oral Given 12/25/20 1724)  lidocaine-EPINEPHrine (XYLOCAINE W/EPI) 1 %-1:100000 (with pres) injection 10 mL (10 mLs Infiltration Given 12/25/20 1810)  Tdap (BOOSTRIX) injection 0.5 mL (0.5 mLs Intramuscular Given 12/25/20 2019)  iohexol (OMNIPAQUE) 350 MG/ML injection 80 mL (80 mLs Intravenous Contrast Given 12/25/20 2251)    ED Course  I have reviewed the triage vital signs and the nursing  notes.  Pertinent labs & imaging results that were available during my care of the patient were reviewed by me and considered in my medical decision making (see chart for details).  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  He also recommends CT head however the patient has a pacemaker which is not clearly MRI compatible. [EW]    Clinical Course User Index [EW] Daleen Bo, MD   MDM Rules/Calculators/A&P                           Patient Vitals for the past 24 hrs:  BP Temp Pulse Resp SpO2 Height Weight  12/26/20 0013 (!) 148/67 -- 72 14 100 % -- --  12/25/20 2215 (!) 144/65 -- 73 11 98 % -- --  12/25/20 2130 134/63 -- 71 18 99 % -- --  12/25/20 2015 (!) 145/68 -- 80 10 100 % -- --  12/25/20 1830 (!) 150/74 -- 96 19 100 % -- --  12/25/20 1800 (!) 169/67 -- 89 11 100 % -- --  12/25/20 1745 (!) 151/74 -- 92 13 100 % -- --  12/25/20 1730 (!) 152/75 -- 92 (!) 9 100 % -- --  12/25/20 1715 (!) 143/87 -- 94 10 100 % -- --  12/25/20 1700 (!) 160/72 -- 98 15 100 % -- --  12/25/20 1509 -- -- -- -- -- 5\' 1"  (1.549 m) 64.4 kg  12/25/20 1504 -- -- -- -- 97 % -- --  12/25/20 1500 (!) 162/75 97.8 F (36.6 C) 93 14 99 % -- --    At time of disposition- reevaluation with update and discussion. After initial assessment and treatment, an  updated evaluation reveals she remains alert and comfortable.  No further symptoms, findings discussed with patient and daughter, all questions answered. Daleen Bo   Medical Decision Making:  This patient is presenting for evaluation of dizziness with fall, multiple, which does require a range of treatment options, and is a complaint that involves a high risk of morbidity and mortality. The differential diagnoses include vasovagal episodes, metabolic disorder, cardiac disorder, CNS disorder, traumatic injury. I decided  to review old records, and in summary elderly female, with recurrent falls, with intermittent chronic dizziness.  I did not require additional historical information from .  Clinical Laboratory Tests Ordered, included CBC, Metabolic panel and Urinalysis. Review indicates no acute abnormalities. Radiologic Tests Ordered, included x-ray left wrist, CT head, CT cervical spine, CT angiogram neck and head.  I independently Visualized: No acute significant abnormalities Cardiac Monitor Tracing which shows normal sinus rhythm     Critical Interventions-clinical evaluation, laboratory testing, radiography, repeat radiography, advanced radiography  After These Interventions, the Patient was reevaluated and was found stable for discharge.  Fall following dizziness with head injury and laceration of the face.  Evaluation for recurrent dizziness, without findings for severe ongoing acute abnormalities.  Patient unable to have imaging of the brain because she has a pacemaker is not MRI compatible.  I have a low level suspicion for acute stroke.  She does not appear to have critical intracranial or extracranial stenosis requiring modification of therapy.  She remained stable in the ED and is a candidate for discharge with outpatient management by observation follow-up with neurology.  CRITICAL CARE-yes Performed by: Daleen Bo  Nursing Notes Reviewed/ Care Coordinated Applicable Imaging Reviewed Interpretation of Laboratory Data incorporated into ED treatment  The patient appears reasonably screened and/or stabilized for discharge and I doubt any other medical condition or other [facility] requiring further screening, evaluation, or treatment in the ED at this time prior to discharge.  Plan: Home Medications-continue current; Home Treatments-rest, fluids, wound care; return here if the recommended treatment, does not improve the symptoms; Recommended follow up-follow-up neurology as soon as possible for  discussion of episodes of dizziness.  See PCP for suture removal and further care as needed     Final Clinical Impression(s) / ED Diagnoses Final diagnoses:  Dizziness  Fall, initial encounter  Facial laceration, initial encounter  Gait instability  Stenosis of carotid artery, unspecified laterality    Rx / DC Orders ED Discharge Orders    None       Daleen Bo, MD 12/26/20 1017

## 2020-12-25 NOTE — ED Notes (Signed)
Patient transported to CT 

## 2020-12-25 NOTE — ED Notes (Signed)
Suture cart & Lidocaine at bedside per EDP order.

## 2020-12-26 NOTE — ED Notes (Signed)
pts pacemaker interrogated

## 2020-12-26 NOTE — ED Notes (Signed)
Patient verbalized understanding of discharge instructions. Opportunity for questions and answers.  

## 2020-12-26 NOTE — ED Notes (Addendum)
Fax was never received from [employer], called help line and was informed they will e-mail rn the form RN printed form

## 2021-01-01 DIAGNOSIS — R269 Unspecified abnormalities of gait and mobility: Secondary | ICD-10-CM | POA: Diagnosis not present

## 2021-01-01 DIAGNOSIS — S0083XA Contusion of other part of head, initial encounter: Secondary | ICD-10-CM | POA: Diagnosis not present

## 2021-01-08 DIAGNOSIS — H401412 Capsular glaucoma with pseudoexfoliation of lens, right eye, moderate stage: Secondary | ICD-10-CM | POA: Diagnosis not present

## 2021-01-08 DIAGNOSIS — H401422 Capsular glaucoma with pseudoexfoliation of lens, left eye, moderate stage: Secondary | ICD-10-CM | POA: Diagnosis not present

## 2021-01-22 DIAGNOSIS — R269 Unspecified abnormalities of gait and mobility: Secondary | ICD-10-CM | POA: Diagnosis not present

## 2021-01-22 DIAGNOSIS — R262 Difficulty in walking, not elsewhere classified: Secondary | ICD-10-CM | POA: Diagnosis not present

## 2021-01-22 DIAGNOSIS — R2681 Unsteadiness on feet: Secondary | ICD-10-CM | POA: Diagnosis not present

## 2021-01-26 DIAGNOSIS — K219 Gastro-esophageal reflux disease without esophagitis: Secondary | ICD-10-CM | POA: Diagnosis not present

## 2021-01-26 DIAGNOSIS — I480 Paroxysmal atrial fibrillation: Secondary | ICD-10-CM | POA: Diagnosis not present

## 2021-01-26 DIAGNOSIS — I2510 Atherosclerotic heart disease of native coronary artery without angina pectoris: Secondary | ICD-10-CM | POA: Diagnosis not present

## 2021-01-26 DIAGNOSIS — E039 Hypothyroidism, unspecified: Secondary | ICD-10-CM | POA: Diagnosis not present

## 2021-01-26 DIAGNOSIS — E782 Mixed hyperlipidemia: Secondary | ICD-10-CM | POA: Diagnosis not present

## 2021-01-26 DIAGNOSIS — I5032 Chronic diastolic (congestive) heart failure: Secondary | ICD-10-CM | POA: Diagnosis not present

## 2021-01-26 DIAGNOSIS — N1831 Chronic kidney disease, stage 3a: Secondary | ICD-10-CM | POA: Diagnosis not present

## 2021-01-26 DIAGNOSIS — E1122 Type 2 diabetes mellitus with diabetic chronic kidney disease: Secondary | ICD-10-CM | POA: Diagnosis not present

## 2021-01-26 DIAGNOSIS — I10 Essential (primary) hypertension: Secondary | ICD-10-CM | POA: Diagnosis not present

## 2021-01-26 DIAGNOSIS — E119 Type 2 diabetes mellitus without complications: Secondary | ICD-10-CM | POA: Diagnosis not present

## 2021-01-26 DIAGNOSIS — M8580 Other specified disorders of bone density and structure, unspecified site: Secondary | ICD-10-CM | POA: Diagnosis not present

## 2021-02-01 DIAGNOSIS — R262 Difficulty in walking, not elsewhere classified: Secondary | ICD-10-CM | POA: Diagnosis not present

## 2021-02-01 DIAGNOSIS — R2681 Unsteadiness on feet: Secondary | ICD-10-CM | POA: Diagnosis not present

## 2021-02-01 DIAGNOSIS — R269 Unspecified abnormalities of gait and mobility: Secondary | ICD-10-CM | POA: Diagnosis not present

## 2021-02-02 DIAGNOSIS — I10 Essential (primary) hypertension: Secondary | ICD-10-CM | POA: Diagnosis not present

## 2021-02-06 DIAGNOSIS — R262 Difficulty in walking, not elsewhere classified: Secondary | ICD-10-CM | POA: Diagnosis not present

## 2021-02-06 DIAGNOSIS — R269 Unspecified abnormalities of gait and mobility: Secondary | ICD-10-CM | POA: Diagnosis not present

## 2021-02-06 DIAGNOSIS — R2681 Unsteadiness on feet: Secondary | ICD-10-CM | POA: Diagnosis not present

## 2021-02-13 DIAGNOSIS — R262 Difficulty in walking, not elsewhere classified: Secondary | ICD-10-CM | POA: Diagnosis not present

## 2021-02-13 DIAGNOSIS — R269 Unspecified abnormalities of gait and mobility: Secondary | ICD-10-CM | POA: Diagnosis not present

## 2021-02-13 DIAGNOSIS — R2681 Unsteadiness on feet: Secondary | ICD-10-CM | POA: Diagnosis not present

## 2021-02-20 DIAGNOSIS — R269 Unspecified abnormalities of gait and mobility: Secondary | ICD-10-CM | POA: Diagnosis not present

## 2021-02-20 DIAGNOSIS — R262 Difficulty in walking, not elsewhere classified: Secondary | ICD-10-CM | POA: Diagnosis not present

## 2021-02-20 DIAGNOSIS — R2681 Unsteadiness on feet: Secondary | ICD-10-CM | POA: Diagnosis not present

## 2021-02-22 DIAGNOSIS — K219 Gastro-esophageal reflux disease without esophagitis: Secondary | ICD-10-CM | POA: Diagnosis not present

## 2021-02-22 DIAGNOSIS — I10 Essential (primary) hypertension: Secondary | ICD-10-CM | POA: Diagnosis not present

## 2021-02-22 DIAGNOSIS — M8580 Other specified disorders of bone density and structure, unspecified site: Secondary | ICD-10-CM | POA: Diagnosis not present

## 2021-02-22 DIAGNOSIS — G4700 Insomnia, unspecified: Secondary | ICD-10-CM | POA: Diagnosis not present

## 2021-02-22 DIAGNOSIS — E782 Mixed hyperlipidemia: Secondary | ICD-10-CM | POA: Diagnosis not present

## 2021-02-22 DIAGNOSIS — E119 Type 2 diabetes mellitus without complications: Secondary | ICD-10-CM | POA: Diagnosis not present

## 2021-02-22 DIAGNOSIS — H409 Unspecified glaucoma: Secondary | ICD-10-CM | POA: Diagnosis not present

## 2021-02-22 DIAGNOSIS — I5032 Chronic diastolic (congestive) heart failure: Secondary | ICD-10-CM | POA: Diagnosis not present

## 2021-02-22 DIAGNOSIS — I2510 Atherosclerotic heart disease of native coronary artery without angina pectoris: Secondary | ICD-10-CM | POA: Diagnosis not present

## 2021-02-22 DIAGNOSIS — E039 Hypothyroidism, unspecified: Secondary | ICD-10-CM | POA: Diagnosis not present

## 2021-02-22 DIAGNOSIS — G450 Vertebro-basilar artery syndrome: Secondary | ICD-10-CM | POA: Diagnosis not present

## 2021-02-22 DIAGNOSIS — I480 Paroxysmal atrial fibrillation: Secondary | ICD-10-CM | POA: Diagnosis not present

## 2021-02-28 DIAGNOSIS — R269 Unspecified abnormalities of gait and mobility: Secondary | ICD-10-CM | POA: Diagnosis not present

## 2021-02-28 DIAGNOSIS — R262 Difficulty in walking, not elsewhere classified: Secondary | ICD-10-CM | POA: Diagnosis not present

## 2021-02-28 DIAGNOSIS — R2681 Unsteadiness on feet: Secondary | ICD-10-CM | POA: Diagnosis not present

## 2021-03-04 DIAGNOSIS — G4733 Obstructive sleep apnea (adult) (pediatric): Secondary | ICD-10-CM | POA: Diagnosis not present

## 2021-03-06 ENCOUNTER — Encounter: Payer: Self-pay | Admitting: Cardiology

## 2021-03-06 ENCOUNTER — Other Ambulatory Visit: Payer: Self-pay

## 2021-03-06 ENCOUNTER — Ambulatory Visit: Payer: Medicare Other | Admitting: Cardiology

## 2021-03-06 VITALS — BP 110/70 | HR 86 | Ht 61.0 in | Wt 141.0 lb

## 2021-03-06 DIAGNOSIS — I480 Paroxysmal atrial fibrillation: Secondary | ICD-10-CM

## 2021-03-06 DIAGNOSIS — Z950 Presence of cardiac pacemaker: Secondary | ICD-10-CM

## 2021-03-06 DIAGNOSIS — I10 Essential (primary) hypertension: Secondary | ICD-10-CM | POA: Diagnosis not present

## 2021-03-06 DIAGNOSIS — W19XXXA Unspecified fall, initial encounter: Secondary | ICD-10-CM

## 2021-03-06 DIAGNOSIS — R296 Repeated falls: Secondary | ICD-10-CM | POA: Diagnosis not present

## 2021-03-06 NOTE — Progress Notes (Signed)
Cardiology Office Note:    Date:  03/06/2021   ID:  Heidi [name], DOB [birth year], MRN [id number]  PCP:  Wenda Low, MD   [facility] Providers Cardiologist:  Candee Furbish, MD     Referring MD: Wenda Low, MD    History of Present Illness:    Heidi [name] is a 80 y.o. female here for follow up of atrial fibrillation, coronary artery disease, congestive heart failure, or hypertension.   Orthostatic at times.  Reviewed Dr. Richardson Landry Klein's note. Overall she has had some return of disequilibrium.  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   Cardiac catheterization was performed on 02/12/2006 which showed minor irregularities, mid RCA 40%, left main normal, LAD normal, ejection fraction 65%. 9/17 Echo normal LV function  12/18 Myoview without ischemia  She had a recent visit to the ED on 12/25/20 for dizziness. She feel on her face and required stitches.  Attending physical therapy is helping her to squat and strengthen her balance. Recovery well since this visit.   Wearing compression socks to control her swelling.   Tolerating her cardiac monitor well, Dr. Caryl Comes continues to monitor this.    She denies any exertional chest pain, tightness, or pressure. She has no orthopnea, PND, or lightheadedness.    Past Medical History:  Diagnosis Date  . Anxiety   . Atrial fibrillation (HCC)    Intermittent  . CAD (coronary artery disease)    non obstructive  . CKD (chronic kidney disease)   . Diabetes mellitus without complication ([place])   . Dysrhythmia    PAF  . GERD (gastroesophageal reflux disease)   . Glaucoma    [place]  . HTN (hypertension)   . Hyperlipidemia   . Hypothyroid   . Major depression    10-17-2010: hospitalized for suicidal, [name] (D/C 10-30-2010)  . Migraine headache   . OSA (obstructive sleep apnea)    CPAP nightly  . Peripheral  neuropathy   . Seasonal allergies   . Sleep apnea   . 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 ABDOMINAL ADHESION SURGERY  2008   exploratory to remove attached to the abdominal wall, stomach and intestines.   . ABDOMINAL HYSTERECTOMY  1976  . APPENDECTOMY    . BREAST EXCISIONAL BIOPSY    . BREAST SURGERY     fibroids removed, benign  . CARDIAC CATHETERIZATION  02-12-2006   Minor irregularities: RCA-mid 40%, LM-normal, LAD-normal. EF 65%.  . CHOLECYSTECTOMY    . ECTOPIC PREGNANCY SURGERY     treatment x3, last one 11-02-2009 at [facility]  . goiter    . goiter removed    . MASS EXCISION Right 10/18/2015   Procedure: EXCISION OF 6CM MASS ON RIGHT  NECK/BACK;  Surgeon: [name], MD;  Location: [place];  Service: General;  Laterality: Right;  . PACEMAKER GENERATOR CHANGE N/A 01/15/2012   Procedure: PACEMAKER GENERATOR CHANGE;  Surgeon: Deboraha Sprang, MD;  Location: [facility] CATH LAB;  Service: Cardiovascular;  Laterality: N/A;  . PACEMAKER INSERTION     Permanent. [employer] EnRhyth 04/2006, set as DDDR  . TUMOR REMOVAL  2000   Fibroid from breast x4-most recent  . WRIST FRACTURE SURGERY  11-2007   right, had metal plate inserted    Current  Medications: Current Meds  Medication Sig  . acetaminophen (TYLENOL) 500 MG tablet Take 1,000 mg by mouth daily as needed for headache (pain).  Marland Kitchen amLODipine (NORVASC) 2.5 MG tablet Take 2.5 mg by mouth at bedtime.  Marland Kitchen aspirin EC 81 MG tablet Take 81 mg by mouth at bedtime.  . benazepril (LOTENSIN) 10 MG tablet Take 10 mg by mouth daily with breakfast.  . cetirizine (ZYRTEC) 10 MG tablet Take 10 mg by mouth daily with breakfast.  . Cholecalciferol (VITAMIN D-3) 125 MCG (5000 UT) TABS Take 5,000 Units by mouth daily with lunch.  . cholestyramine (QUESTRAN) 4 g packet Take 4 g by mouth 2 (two) times daily as needed (diarrhea).  . fenofibrate 160 MG tablet Take 160  mg by mouth at bedtime. Take with food  . ferrous sulfate 325 (65 FE) MG tablet Take 325 mg by mouth daily with lunch.  . fluticasone (FLONASE) 50 MCG/ACT nasal spray Place 2 sprays into the nose daily as needed for allergies.   . furosemide (LASIX) 20 MG tablet TAKE 1 TABLET BY MOUTH THREE TIMES WEEKLY. PLEASE MAKE YEARLY APPT WITH DISCARD REMAINDER. [place] 2022  . gabapentin (NEURONTIN) 300 MG capsule Take 300 mg by mouth 3 (three) times daily.  Marland Kitchen galantamine (RAZADYNE ER) 8 MG 24 hr capsule Take 8 mg by mouth daily with breakfast.  . hydrOXYzine (ATARAX/VISTARIL) 25 MG tablet Take 50 mg by mouth at bedtime.  Marland Kitchen levothyroxine (SYNTHROID, LEVOTHROID) 75 MCG tablet Take 75 mcg by mouth daily before breakfast.  . metFORMIN (GLUCOPHAGE-XR) 500 MG 24 hr tablet Take 500 mg by mouth in the morning and at bedtime.  . metoprolol succinate (TOPROL-XL) 100 MG 24 hr tablet TAKE 1 TABLET(100 MG) BY MOUTH DAILY  . Multiple Vitamin (MULTIVITAMIN WITH MINERALS) TABS tablet Take 1 tablet by mouth daily with breakfast.  . nortriptyline (PAMELOR) 25 MG capsule Take 25 mg by mouth at bedtime. Take with a 75 mg capsule for a total dose of 100 mg nightly  . nortriptyline (PAMELOR) 75 MG capsule Take 75 mg by mouth at bedtime. Take with a 25 mg capsule for a total dose of 100 mg nightly  . ofloxacin (OCUFLOX) 0.3 % ophthalmic solution Place 1 drop into the left eye 4 (four) times daily.  . pantoprazole (PROTONIX) 40 MG tablet Take 40 mg by mouth See admin instructions. Take one tablet (40 mg) by mouth twice daily - lunch and bedtime  . PRESCRIPTION MEDICATION Inhale into the lungs at bedtime. CPAP  . spironolactone (ALDACTONE) 25 MG tablet Take 1 tablet (25 mg total) by mouth daily.  . vitamin C (ASCORBIC ACID) 500 MG tablet Take 500 mg by mouth daily with lunch.     Allergies:   Crestor [rosuvastatin calcium], Effexor [venlafaxine hydrochloride], Hibiclens [chlorhexidine gluconate], Nardil, Penicillins,  Adhesive [tape], Clopidogrel, Lipitor [atorvastatin calcium], Plavix [clopidogrel bisulfate], Simvastatin, Sulfonamide derivatives, and Zestril [lisinopril]   Social History   Socioeconomic History  . Marital status: Widowed    Spouse name: Not on file  . Number of children: Not on file  . Years of education: Not on file  . Highest education level: Not on file  Occupational History  . Not on file  Tobacco Use  . Smoking status: Former Research scientist (life sciences)  . Smokeless tobacco: Never Used  Vaping Use  . Vaping Use: Never used  Substance and Sexual Activity  . Alcohol use: No  . Drug use: No  . Sexual activity: Never  Other Topics Concern  .  Not on file  Social History Narrative  . Not on file   Social Determinants of Health   Financial Resource Strain: Not on file  Food Insecurity: Not on file  Transportation Needs: Not on file  Physical Activity: Not on file  Stress: Not on file  Social Connections: Not on file     Family History: The patient's family history includes Atrial fibrillation in her brother; Stomach cancer in her father.  ROS:   Please see the history of present illness. All other systems reviewed and are negative.  EKGs/Labs/Other Studies Reviewed:    The following studies were reviewed today: US Carotid Bilateral 04/28/19- FINDINGS: Criteria: Quantification of carotid stenosis is based on velocity parameters that correlate the residual internal carotid diameter with NASCET-based stenosis levels, using the diameter of the distal internal carotid lumen as the denominator for stenosis measurement.  Lexiscan Stress 09/11/17: Study Highlights  Nuclear stress EF: 74%.  There was no ST segment deviation noted during stress.  No T wave inversion was noted during stress.  The study is normal.  This is a low risk study.  US Arterial Seg Multiple LE 03/11/17: IMPRESSION: No evidence of significant arterial occlusive disease in either lower extremity. Resting  ankle-brachial indices are normal. There likely is a a mild component of tibial and digital disease in the left lower extremity.  EKG:  03/06/21- sinus rythym, rate 86, LAFB    Recent Labs: 12/25/2020: BUN 18; Creatinine, Ser 0.91; Hemoglobin 13.4; Platelets 256; Potassium 4.0; Sodium 139  Recent Lipid Panel    Component Value Date/Time   CHOL 188 04/02/2011 0905   TRIG 85.0 04/02/2011 0905   HDL 58.40 04/02/2011 0905   CHOLHDL 3 04/02/2011 0905   VLDL 17.0 04/02/2011 0905   LDLCALC 113 (H) 04/02/2011 0905     Risk Assessment/Calculations:      Physical Exam:    VS:  BP 110/70 (BP Location: Left Arm, Patient Position: Sitting, Cuff Size: Normal)   Pulse 86   Ht 5\' 1"  (1.549 m)   Wt 141 lb (64 kg)   SpO2 98%   BMI 26.64 kg/m     Wt Readings from Last 3 Encounters:  03/06/21 141 lb (64 kg)  12/25/20 142 lb (64.4 kg)  03/08/20 142 lb (64.4 kg)     GEN: Well nourished, well developed in no acute distress HEENT: Normal NECK: No JVD; No carotid bruits LYMPHATICS: No lymphadenopathy CARDIAC:RRR, no murmurs, rubs, gallops RESPIRATORY:  Clear to auscultation without rales, wheezing or rhonchi  ABDOMEN: Soft, non-tender, non-distended MUSCULOSKELETAL:  No edema; No deformity  SKIN: Warm and dry NEUROLOGIC:  Alert and oriented x 3 PSYCHIATRIC:  Normal affect   ASSESSMENT:    1. Paroxysmal atrial fibrillation (HCC)   2. Essential hypertension   3. Pacemaker-mdt   4. Fall, initial encounter    PLAN:    In order of problems listed above:  Paroxysmal atrial relation - Overall doing well.  No further A. fib since device implant in 2013. -Continue with Toprol-XL 100 mg a day for medication management.  Refills as needed.  Pacemaker for backup.  Pacemaker - [employer] followed by Dr. Caryl Comes.  TIA June 2020 - Right thigh muscle affected.  We have talked in the past about anticoagulation continue to monitor device closely for any evidence of A. fib.  Coronary artery  disease - Prior 40% RCA previously noted.  Essential hypertension - Better control continue with current medications.  Disequilibrium - Has been diagnosed in the past  with vertebrobasilar insufficiency and has worked with neurology.  Has been to the emergency department for dizziness in the past.  She had suffered a fall, facial laceration.  Pacemaker in place functioning normally.  She has been seeing physical therapy that is helping her with squatting maneuvers to help pick things up.  She also uses a pincher device to help pick things up from the floor without bending over.  She is using a Rollator walker as well.       Medication Adjustments/Labs and Tests Ordered: Current medicines are reviewed at length with the patient today.  Concerns regarding medicines are outlined above.  Orders Placed This Encounter  Procedures  . EKG 12-Lead   No orders of the defined types were placed in this encounter.   Patient Instructions  Medication Instructions:  The current medical regimen is effective;  continue present plan and medications.  *If you need a refill on your cardiac medications before your next appointment, please call your pharmacy*  Follow-Up: At [facility], you and your health needs are our priority.  As part of our continuing mission to provide you with exceptional heart care, we have created designated Provider Care Teams.  These Care Teams include your primary Cardiologist (physician) and Advanced Practice Providers (APPs -  Physician Assistants and Nurse Practitioners) who all work together to provide you with the care you need, when you need it.  We recommend signing up for the patient portal called "MyChart".  Sign up information is provided on this After Visit Summary.  MyChart is used to connect with patients for Virtual Visits (Telemedicine).  Patients are able to view lab/test results, encounter notes, upcoming appointments, etc.  Non-urgent messages can be sent to  your provider as well.   To learn more about what you can do with MyChart, go to [email or web address].    Your next appointment:   1 year(s)  The format for your next appointment:   In Person  Provider:   Candee Furbish, MD        I,Alexis Bryant,acting as a scribe for Candee Furbish, MD.,have documented all relevant documentation on the behalf of Candee Furbish, MD,as directed by  Candee Furbish, MD while in the presence of Candee Furbish, MD.  I, Candee Furbish, MD, have reviewed all documentation for this visit. The documentation on 03/06/21 for the exam, diagnosis, procedures, and orders are all accurate and complete.  Signed, Candee Furbish, MD  03/06/2021 11:39 AM    [facility]

## 2021-03-06 NOTE — Patient Instructions (Signed)
Medication Instructions:  The current medical regimen is effective;  continue present plan and medications.  *If you need a refill on your cardiac medications before your next appointment, please call your pharmacy*   Follow-Up: At [facility], you and your health needs are our priority.  As part of our continuing mission to provide you with exceptional heart care, we have created designated Provider Care Teams.  These Care Teams include your primary Cardiologist (physician) and Advanced Practice Providers (APPs -  Physician Assistants and Nurse Practitioners) who all work together to provide you with the care you need, when you need it.  We recommend signing up for the patient portal called "MyChart".  Sign up information is provided on this After Visit Summary.  MyChart is used to connect with patients for Virtual Visits (Telemedicine).  Patients are able to view lab/test results, encounter notes, upcoming appointments, etc.  Non-urgent messages can be sent to your provider as well.   To learn more about what you can do with MyChart, go to [email or web address].    Your next appointment:   1 year(s)  The format for your next appointment:   In Person  Provider:   Mark Skains, MD {        

## 2021-03-07 ENCOUNTER — Ambulatory Visit (INDEPENDENT_AMBULATORY_CARE_PROVIDER_SITE_OTHER): Payer: Medicare Other

## 2021-03-07 DIAGNOSIS — I480 Paroxysmal atrial fibrillation: Secondary | ICD-10-CM

## 2021-03-08 DIAGNOSIS — R262 Difficulty in walking, not elsewhere classified: Secondary | ICD-10-CM | POA: Diagnosis not present

## 2021-03-08 DIAGNOSIS — R2681 Unsteadiness on feet: Secondary | ICD-10-CM | POA: Diagnosis not present

## 2021-03-08 DIAGNOSIS — R269 Unspecified abnormalities of gait and mobility: Secondary | ICD-10-CM | POA: Diagnosis not present

## 2021-03-08 LAB — CUP PACEART REMOTE DEVICE CHECK
Battery Impedance: 1216 Ohm
Battery Remaining Longevity: 61 mo
Battery Voltage: 2.77 V
Brady Statistic AP VP Percent: 3 %
Brady Statistic AP VS Percent: 12 %
Brady Statistic AS VP Percent: 0 %
Brady Statistic AS VS Percent: 85 %
Date Time Interrogation Session: 20220601084640
Implantable Lead Implant Date: 20070710
Implantable Lead Implant Date: 20070710
Implantable Lead Location: 753859
Implantable Lead Location: 753860
Implantable Lead Model: 4469
Implantable Lead Model: 4470
Implantable Lead Serial Number: 483166
Implantable Lead Serial Number: 541525
Implantable Pulse Generator Implant Date: 20130410
Lead Channel Impedance Value: 610 Ohm
Lead Channel Impedance Value: 655 Ohm
Lead Channel Pacing Threshold Amplitude: 0.625 V
Lead Channel Pacing Threshold Amplitude: 0.625 V
Lead Channel Pacing Threshold Pulse Width: 0.4 ms
Lead Channel Pacing Threshold Pulse Width: 0.4 ms
Lead Channel Setting Pacing Amplitude: 2 V
Lead Channel Setting Pacing Amplitude: 2.5 V
Lead Channel Setting Pacing Pulse Width: 0.4 ms
Lead Channel Setting Sensing Sensitivity: 4 mV

## 2021-03-16 DIAGNOSIS — R269 Unspecified abnormalities of gait and mobility: Secondary | ICD-10-CM | POA: Diagnosis not present

## 2021-03-16 DIAGNOSIS — R262 Difficulty in walking, not elsewhere classified: Secondary | ICD-10-CM | POA: Diagnosis not present

## 2021-03-16 DIAGNOSIS — R2681 Unsteadiness on feet: Secondary | ICD-10-CM | POA: Diagnosis not present

## 2021-03-21 DIAGNOSIS — R269 Unspecified abnormalities of gait and mobility: Secondary | ICD-10-CM | POA: Diagnosis not present

## 2021-03-21 DIAGNOSIS — R2681 Unsteadiness on feet: Secondary | ICD-10-CM | POA: Diagnosis not present

## 2021-03-21 DIAGNOSIS — R262 Difficulty in walking, not elsewhere classified: Secondary | ICD-10-CM | POA: Diagnosis not present

## 2021-03-28 DIAGNOSIS — R269 Unspecified abnormalities of gait and mobility: Secondary | ICD-10-CM | POA: Diagnosis not present

## 2021-03-28 DIAGNOSIS — R2681 Unsteadiness on feet: Secondary | ICD-10-CM | POA: Diagnosis not present

## 2021-03-28 DIAGNOSIS — R262 Difficulty in walking, not elsewhere classified: Secondary | ICD-10-CM | POA: Diagnosis not present

## 2021-03-30 NOTE — Progress Notes (Signed)
Remote pacemaker transmission.   

## 2021-04-05 DIAGNOSIS — I10 Essential (primary) hypertension: Secondary | ICD-10-CM | POA: Diagnosis not present

## 2021-04-10 DIAGNOSIS — R2681 Unsteadiness on feet: Secondary | ICD-10-CM | POA: Diagnosis not present

## 2021-04-10 DIAGNOSIS — R262 Difficulty in walking, not elsewhere classified: Secondary | ICD-10-CM | POA: Diagnosis not present

## 2021-04-10 DIAGNOSIS — R269 Unspecified abnormalities of gait and mobility: Secondary | ICD-10-CM | POA: Diagnosis not present

## 2021-04-16 ENCOUNTER — Other Ambulatory Visit: Payer: Self-pay | Admitting: Internal Medicine

## 2021-04-16 DIAGNOSIS — Z1231 Encounter for screening mammogram for malignant neoplasm of breast: Secondary | ICD-10-CM

## 2021-04-16 DIAGNOSIS — E039 Hypothyroidism, unspecified: Secondary | ICD-10-CM | POA: Diagnosis not present

## 2021-04-16 DIAGNOSIS — I5032 Chronic diastolic (congestive) heart failure: Secondary | ICD-10-CM | POA: Diagnosis not present

## 2021-04-16 DIAGNOSIS — E782 Mixed hyperlipidemia: Secondary | ICD-10-CM | POA: Diagnosis not present

## 2021-04-16 DIAGNOSIS — I2510 Atherosclerotic heart disease of native coronary artery without angina pectoris: Secondary | ICD-10-CM | POA: Diagnosis not present

## 2021-04-16 DIAGNOSIS — E1122 Type 2 diabetes mellitus with diabetic chronic kidney disease: Secondary | ICD-10-CM | POA: Diagnosis not present

## 2021-04-16 DIAGNOSIS — Z1389 Encounter for screening for other disorder: Secondary | ICD-10-CM | POA: Diagnosis not present

## 2021-04-16 DIAGNOSIS — Z7984 Long term (current) use of oral hypoglycemic drugs: Secondary | ICD-10-CM | POA: Diagnosis not present

## 2021-04-16 DIAGNOSIS — I480 Paroxysmal atrial fibrillation: Secondary | ICD-10-CM | POA: Diagnosis not present

## 2021-04-16 DIAGNOSIS — Z0000 Encounter for general adult medical examination without abnormal findings: Secondary | ICD-10-CM | POA: Diagnosis not present

## 2021-04-16 DIAGNOSIS — K219 Gastro-esophageal reflux disease without esophagitis: Secondary | ICD-10-CM | POA: Diagnosis not present

## 2021-04-16 DIAGNOSIS — I10 Essential (primary) hypertension: Secondary | ICD-10-CM | POA: Diagnosis not present

## 2021-04-16 DIAGNOSIS — G450 Vertebro-basilar artery syndrome: Secondary | ICD-10-CM | POA: Diagnosis not present

## 2021-04-16 DIAGNOSIS — G4733 Obstructive sleep apnea (adult) (pediatric): Secondary | ICD-10-CM | POA: Diagnosis not present

## 2021-04-16 DIAGNOSIS — H8110 Benign paroxysmal vertigo, unspecified ear: Secondary | ICD-10-CM | POA: Diagnosis not present

## 2021-04-16 DIAGNOSIS — N1831 Chronic kidney disease, stage 3a: Secondary | ICD-10-CM | POA: Diagnosis not present

## 2021-04-17 ENCOUNTER — Ambulatory Visit
Admission: RE | Admit: 2021-04-17 | Discharge: 2021-04-17 | Disposition: A | Discharge status: Home / Self Care | Payer: Medicare Other | Source: Ambulatory Visit | Attending: Internal Medicine | Admitting: Internal Medicine

## 2021-04-17 ENCOUNTER — Other Ambulatory Visit: Payer: Self-pay

## 2021-04-17 DIAGNOSIS — Z1231 Encounter for screening mammogram for malignant neoplasm of breast: Secondary | ICD-10-CM | POA: Diagnosis not present

## 2021-04-18 DIAGNOSIS — R269 Unspecified abnormalities of gait and mobility: Secondary | ICD-10-CM | POA: Diagnosis not present

## 2021-04-18 DIAGNOSIS — R2681 Unsteadiness on feet: Secondary | ICD-10-CM | POA: Diagnosis not present

## 2021-04-18 DIAGNOSIS — R262 Difficulty in walking, not elsewhere classified: Secondary | ICD-10-CM | POA: Diagnosis not present

## 2021-04-21 ENCOUNTER — Other Ambulatory Visit: Payer: Self-pay | Admitting: Internal Medicine

## 2021-04-24 ENCOUNTER — Other Ambulatory Visit: Payer: Self-pay | Admitting: Internal Medicine

## 2021-04-24 DIAGNOSIS — N1831 Chronic kidney disease, stage 3a: Secondary | ICD-10-CM | POA: Diagnosis not present

## 2021-04-24 DIAGNOSIS — I2510 Atherosclerotic heart disease of native coronary artery without angina pectoris: Secondary | ICD-10-CM | POA: Diagnosis not present

## 2021-04-24 DIAGNOSIS — I10 Essential (primary) hypertension: Secondary | ICD-10-CM | POA: Diagnosis not present

## 2021-04-24 DIAGNOSIS — G450 Vertebro-basilar artery syndrome: Secondary | ICD-10-CM | POA: Diagnosis not present

## 2021-04-24 DIAGNOSIS — K219 Gastro-esophageal reflux disease without esophagitis: Secondary | ICD-10-CM | POA: Diagnosis not present

## 2021-04-24 DIAGNOSIS — E119 Type 2 diabetes mellitus without complications: Secondary | ICD-10-CM | POA: Diagnosis not present

## 2021-04-24 DIAGNOSIS — I480 Paroxysmal atrial fibrillation: Secondary | ICD-10-CM | POA: Diagnosis not present

## 2021-04-24 DIAGNOSIS — M8580 Other specified disorders of bone density and structure, unspecified site: Secondary | ICD-10-CM | POA: Diagnosis not present

## 2021-04-24 DIAGNOSIS — E782 Mixed hyperlipidemia: Secondary | ICD-10-CM | POA: Diagnosis not present

## 2021-04-24 DIAGNOSIS — I5032 Chronic diastolic (congestive) heart failure: Secondary | ICD-10-CM | POA: Diagnosis not present

## 2021-04-24 DIAGNOSIS — E039 Hypothyroidism, unspecified: Secondary | ICD-10-CM | POA: Diagnosis not present

## 2021-04-25 DIAGNOSIS — R269 Unspecified abnormalities of gait and mobility: Secondary | ICD-10-CM | POA: Diagnosis not present

## 2021-04-25 DIAGNOSIS — R2681 Unsteadiness on feet: Secondary | ICD-10-CM | POA: Diagnosis not present

## 2021-04-25 DIAGNOSIS — R262 Difficulty in walking, not elsewhere classified: Secondary | ICD-10-CM | POA: Diagnosis not present

## 2021-05-02 DIAGNOSIS — R262 Difficulty in walking, not elsewhere classified: Secondary | ICD-10-CM | POA: Diagnosis not present

## 2021-05-02 DIAGNOSIS — R2681 Unsteadiness on feet: Secondary | ICD-10-CM | POA: Diagnosis not present

## 2021-05-02 DIAGNOSIS — R269 Unspecified abnormalities of gait and mobility: Secondary | ICD-10-CM | POA: Diagnosis not present

## 2021-05-04 ENCOUNTER — Other Ambulatory Visit: Payer: Self-pay | Admitting: Cardiology

## 2021-05-09 DIAGNOSIS — R269 Unspecified abnormalities of gait and mobility: Secondary | ICD-10-CM | POA: Diagnosis not present

## 2021-05-09 DIAGNOSIS — R262 Difficulty in walking, not elsewhere classified: Secondary | ICD-10-CM | POA: Diagnosis not present

## 2021-05-09 DIAGNOSIS — R2681 Unsteadiness on feet: Secondary | ICD-10-CM | POA: Diagnosis not present

## 2021-05-16 DIAGNOSIS — G4733 Obstructive sleep apnea (adult) (pediatric): Secondary | ICD-10-CM | POA: Diagnosis not present

## 2021-05-17 DIAGNOSIS — R269 Unspecified abnormalities of gait and mobility: Secondary | ICD-10-CM | POA: Diagnosis not present

## 2021-05-17 DIAGNOSIS — R2681 Unsteadiness on feet: Secondary | ICD-10-CM | POA: Diagnosis not present

## 2021-05-17 DIAGNOSIS — R262 Difficulty in walking, not elsewhere classified: Secondary | ICD-10-CM | POA: Diagnosis not present

## 2021-05-23 DIAGNOSIS — R269 Unspecified abnormalities of gait and mobility: Secondary | ICD-10-CM | POA: Diagnosis not present

## 2021-05-23 DIAGNOSIS — R262 Difficulty in walking, not elsewhere classified: Secondary | ICD-10-CM | POA: Diagnosis not present

## 2021-05-23 DIAGNOSIS — R2681 Unsteadiness on feet: Secondary | ICD-10-CM | POA: Diagnosis not present

## 2021-05-30 DIAGNOSIS — R262 Difficulty in walking, not elsewhere classified: Secondary | ICD-10-CM | POA: Diagnosis not present

## 2021-05-30 DIAGNOSIS — R269 Unspecified abnormalities of gait and mobility: Secondary | ICD-10-CM | POA: Diagnosis not present

## 2021-05-30 DIAGNOSIS — R2681 Unsteadiness on feet: Secondary | ICD-10-CM | POA: Diagnosis not present

## 2021-06-06 ENCOUNTER — Ambulatory Visit (INDEPENDENT_AMBULATORY_CARE_PROVIDER_SITE_OTHER): Payer: Medicare Other

## 2021-06-06 DIAGNOSIS — I495 Sick sinus syndrome: Secondary | ICD-10-CM

## 2021-06-07 ENCOUNTER — Other Ambulatory Visit: Payer: Self-pay | Admitting: Internal Medicine

## 2021-06-12 LAB — CUP PACEART REMOTE DEVICE CHECK
Battery Impedance: 1241 Ohm
Battery Remaining Longevity: 60 mo
Battery Voltage: 2.78 V
Brady Statistic AP VP Percent: 3 %
Brady Statistic AP VS Percent: 12 %
Brady Statistic AS VP Percent: 0 %
Brady Statistic AS VS Percent: 85 %
Date Time Interrogation Session: 20220831173330
Implantable Lead Implant Date: 20070710
Implantable Lead Implant Date: 20070710
Implantable Lead Location: 753859
Implantable Lead Location: 753860
Implantable Lead Model: 4469
Implantable Lead Model: 4470
Implantable Lead Serial Number: 483166
Implantable Lead Serial Number: 541525
Implantable Pulse Generator Implant Date: 20130410
Lead Channel Impedance Value: 632 Ohm
Lead Channel Impedance Value: 725 Ohm
Lead Channel Pacing Threshold Amplitude: 0.625 V
Lead Channel Pacing Threshold Amplitude: 0.75 V
Lead Channel Pacing Threshold Pulse Width: 0.4 ms
Lead Channel Pacing Threshold Pulse Width: 0.4 ms
Lead Channel Setting Pacing Amplitude: 2 V
Lead Channel Setting Pacing Amplitude: 2.5 V
Lead Channel Setting Pacing Pulse Width: 0.4 ms
Lead Channel Setting Sensing Sensitivity: 5.6 mV

## 2021-06-13 DIAGNOSIS — R2681 Unsteadiness on feet: Secondary | ICD-10-CM | POA: Diagnosis not present

## 2021-06-13 DIAGNOSIS — R269 Unspecified abnormalities of gait and mobility: Secondary | ICD-10-CM | POA: Diagnosis not present

## 2021-06-13 DIAGNOSIS — R262 Difficulty in walking, not elsewhere classified: Secondary | ICD-10-CM | POA: Diagnosis not present

## 2021-06-19 NOTE — Progress Notes (Signed)
Remote pacemaker transmission.   

## 2021-06-20 DIAGNOSIS — R262 Difficulty in walking, not elsewhere classified: Secondary | ICD-10-CM | POA: Diagnosis not present

## 2021-06-20 DIAGNOSIS — R2681 Unsteadiness on feet: Secondary | ICD-10-CM | POA: Diagnosis not present

## 2021-06-20 DIAGNOSIS — R269 Unspecified abnormalities of gait and mobility: Secondary | ICD-10-CM | POA: Diagnosis not present

## 2021-06-27 DIAGNOSIS — R269 Unspecified abnormalities of gait and mobility: Secondary | ICD-10-CM | POA: Diagnosis not present

## 2021-06-27 DIAGNOSIS — R262 Difficulty in walking, not elsewhere classified: Secondary | ICD-10-CM | POA: Diagnosis not present

## 2021-06-27 DIAGNOSIS — R2681 Unsteadiness on feet: Secondary | ICD-10-CM | POA: Diagnosis not present

## 2021-07-04 DIAGNOSIS — R269 Unspecified abnormalities of gait and mobility: Secondary | ICD-10-CM | POA: Diagnosis not present

## 2021-07-04 DIAGNOSIS — R262 Difficulty in walking, not elsewhere classified: Secondary | ICD-10-CM | POA: Diagnosis not present

## 2021-07-04 DIAGNOSIS — R2681 Unsteadiness on feet: Secondary | ICD-10-CM | POA: Diagnosis not present

## 2021-07-05 DIAGNOSIS — G4733 Obstructive sleep apnea (adult) (pediatric): Secondary | ICD-10-CM | POA: Diagnosis not present

## 2021-07-12 DIAGNOSIS — R262 Difficulty in walking, not elsewhere classified: Secondary | ICD-10-CM | POA: Diagnosis not present

## 2021-07-12 DIAGNOSIS — R2681 Unsteadiness on feet: Secondary | ICD-10-CM | POA: Diagnosis not present

## 2021-07-12 DIAGNOSIS — R269 Unspecified abnormalities of gait and mobility: Secondary | ICD-10-CM | POA: Diagnosis not present

## 2021-07-26 DIAGNOSIS — R2681 Unsteadiness on feet: Secondary | ICD-10-CM | POA: Diagnosis not present

## 2021-07-26 DIAGNOSIS — R262 Difficulty in walking, not elsewhere classified: Secondary | ICD-10-CM | POA: Diagnosis not present

## 2021-07-26 DIAGNOSIS — R269 Unspecified abnormalities of gait and mobility: Secondary | ICD-10-CM | POA: Diagnosis not present

## 2021-08-01 DIAGNOSIS — R519 Headache, unspecified: Secondary | ICD-10-CM | POA: Diagnosis not present

## 2021-08-01 DIAGNOSIS — B349 Viral infection, unspecified: Secondary | ICD-10-CM | POA: Diagnosis not present

## 2021-08-01 DIAGNOSIS — Z03818 Encounter for observation for suspected exposure to other biological agents ruled out: Secondary | ICD-10-CM | POA: Diagnosis not present

## 2021-08-01 DIAGNOSIS — R051 Acute cough: Secondary | ICD-10-CM | POA: Diagnosis not present

## 2021-08-06 DIAGNOSIS — I10 Essential (primary) hypertension: Secondary | ICD-10-CM | POA: Diagnosis not present

## 2021-08-07 ENCOUNTER — Ambulatory Visit
Admission: RE | Admit: 2021-08-07 | Discharge: 2021-08-07 | Disposition: A | Discharge status: Home / Self Care | Payer: Medicare Other | Source: Ambulatory Visit | Attending: Physician Assistant | Admitting: Physician Assistant

## 2021-08-07 ENCOUNTER — Other Ambulatory Visit: Payer: Self-pay | Admitting: Physician Assistant

## 2021-08-07 DIAGNOSIS — J069 Acute upper respiratory infection, unspecified: Secondary | ICD-10-CM

## 2021-08-07 DIAGNOSIS — I700 Atherosclerosis of aorta: Secondary | ICD-10-CM | POA: Diagnosis not present

## 2021-08-07 DIAGNOSIS — M419 Scoliosis, unspecified: Secondary | ICD-10-CM | POA: Diagnosis not present

## 2021-08-27 DIAGNOSIS — N1831 Chronic kidney disease, stage 3a: Secondary | ICD-10-CM | POA: Diagnosis not present

## 2021-08-27 DIAGNOSIS — E1122 Type 2 diabetes mellitus with diabetic chronic kidney disease: Secondary | ICD-10-CM | POA: Diagnosis not present

## 2021-08-27 DIAGNOSIS — E782 Mixed hyperlipidemia: Secondary | ICD-10-CM | POA: Diagnosis not present

## 2021-08-27 DIAGNOSIS — I480 Paroxysmal atrial fibrillation: Secondary | ICD-10-CM | POA: Diagnosis not present

## 2021-08-27 DIAGNOSIS — G4700 Insomnia, unspecified: Secondary | ICD-10-CM | POA: Diagnosis not present

## 2021-08-27 DIAGNOSIS — E039 Hypothyroidism, unspecified: Secondary | ICD-10-CM | POA: Diagnosis not present

## 2021-08-27 DIAGNOSIS — G450 Vertebro-basilar artery syndrome: Secondary | ICD-10-CM | POA: Diagnosis not present

## 2021-08-27 DIAGNOSIS — H409 Unspecified glaucoma: Secondary | ICD-10-CM | POA: Diagnosis not present

## 2021-08-27 DIAGNOSIS — K219 Gastro-esophageal reflux disease without esophagitis: Secondary | ICD-10-CM | POA: Diagnosis not present

## 2021-08-27 DIAGNOSIS — I10 Essential (primary) hypertension: Secondary | ICD-10-CM | POA: Diagnosis not present

## 2021-09-05 ENCOUNTER — Ambulatory Visit (INDEPENDENT_AMBULATORY_CARE_PROVIDER_SITE_OTHER): Payer: Medicare Other

## 2021-09-05 DIAGNOSIS — I495 Sick sinus syndrome: Secondary | ICD-10-CM

## 2021-09-05 DIAGNOSIS — I10 Essential (primary) hypertension: Secondary | ICD-10-CM | POA: Diagnosis not present

## 2021-09-06 LAB — CUP PACEART REMOTE DEVICE CHECK
Battery Impedance: 1319 Ohm
Battery Remaining Longevity: 57 mo
Battery Voltage: 2.78 V
Brady Statistic AP VP Percent: 3 %
Brady Statistic AP VS Percent: 13 %
Brady Statistic AS VP Percent: 0 %
Brady Statistic AS VS Percent: 84 %
Date Time Interrogation Session: 20221201112709
Implantable Lead Implant Date: 20070710
Implantable Lead Implant Date: 20070710
Implantable Lead Location: 753859
Implantable Lead Location: 753860
Implantable Lead Model: 4469
Implantable Lead Model: 4470
Implantable Lead Serial Number: 483166
Implantable Lead Serial Number: 541525
Implantable Pulse Generator Implant Date: 20130410
Lead Channel Impedance Value: 652 Ohm
Lead Channel Impedance Value: 737 Ohm
Lead Channel Pacing Threshold Amplitude: 0.625 V
Lead Channel Pacing Threshold Amplitude: 0.625 V
Lead Channel Pacing Threshold Pulse Width: 0.4 ms
Lead Channel Pacing Threshold Pulse Width: 0.4 ms
Lead Channel Setting Pacing Amplitude: 2 V
Lead Channel Setting Pacing Amplitude: 2.5 V
Lead Channel Setting Pacing Pulse Width: 0.4 ms
Lead Channel Setting Sensing Sensitivity: 5.6 mV

## 2021-09-14 NOTE — Progress Notes (Signed)
Remote pacemaker transmission.   

## 2021-10-04 DIAGNOSIS — K219 Gastro-esophageal reflux disease without esophagitis: Secondary | ICD-10-CM | POA: Diagnosis not present

## 2021-10-04 DIAGNOSIS — E1122 Type 2 diabetes mellitus with diabetic chronic kidney disease: Secondary | ICD-10-CM | POA: Diagnosis not present

## 2021-10-04 DIAGNOSIS — I480 Paroxysmal atrial fibrillation: Secondary | ICD-10-CM | POA: Diagnosis not present

## 2021-10-04 DIAGNOSIS — E782 Mixed hyperlipidemia: Secondary | ICD-10-CM | POA: Diagnosis not present

## 2021-10-04 DIAGNOSIS — N1831 Chronic kidney disease, stage 3a: Secondary | ICD-10-CM | POA: Diagnosis not present

## 2021-10-04 DIAGNOSIS — G450 Vertebro-basilar artery syndrome: Secondary | ICD-10-CM | POA: Diagnosis not present

## 2021-10-04 DIAGNOSIS — H409 Unspecified glaucoma: Secondary | ICD-10-CM | POA: Diagnosis not present

## 2021-10-04 DIAGNOSIS — G4700 Insomnia, unspecified: Secondary | ICD-10-CM | POA: Diagnosis not present

## 2021-10-04 DIAGNOSIS — I10 Essential (primary) hypertension: Secondary | ICD-10-CM | POA: Diagnosis not present

## 2021-10-04 DIAGNOSIS — E039 Hypothyroidism, unspecified: Secondary | ICD-10-CM | POA: Diagnosis not present

## 2021-10-05 DIAGNOSIS — I10 Essential (primary) hypertension: Secondary | ICD-10-CM | POA: Diagnosis not present

## 2021-10-17 DIAGNOSIS — I480 Paroxysmal atrial fibrillation: Secondary | ICD-10-CM | POA: Diagnosis not present

## 2021-10-17 DIAGNOSIS — N1831 Chronic kidney disease, stage 3a: Secondary | ICD-10-CM | POA: Diagnosis not present

## 2021-10-17 DIAGNOSIS — H8110 Benign paroxysmal vertigo, unspecified ear: Secondary | ICD-10-CM | POA: Diagnosis not present

## 2021-10-17 DIAGNOSIS — G450 Vertebro-basilar artery syndrome: Secondary | ICD-10-CM | POA: Diagnosis not present

## 2021-10-17 DIAGNOSIS — E039 Hypothyroidism, unspecified: Secondary | ICD-10-CM | POA: Diagnosis not present

## 2021-10-17 DIAGNOSIS — H02409 Unspecified ptosis of unspecified eyelid: Secondary | ICD-10-CM | POA: Diagnosis not present

## 2021-10-17 DIAGNOSIS — I10 Essential (primary) hypertension: Secondary | ICD-10-CM | POA: Diagnosis not present

## 2021-10-17 DIAGNOSIS — G629 Polyneuropathy, unspecified: Secondary | ICD-10-CM | POA: Diagnosis not present

## 2021-10-17 DIAGNOSIS — I2510 Atherosclerotic heart disease of native coronary artery without angina pectoris: Secondary | ICD-10-CM | POA: Diagnosis not present

## 2021-10-17 DIAGNOSIS — E1122 Type 2 diabetes mellitus with diabetic chronic kidney disease: Secondary | ICD-10-CM | POA: Diagnosis not present

## 2021-10-17 DIAGNOSIS — I5032 Chronic diastolic (congestive) heart failure: Secondary | ICD-10-CM | POA: Diagnosis not present

## 2021-10-18 ENCOUNTER — Encounter: Payer: Self-pay | Admitting: Neurology

## 2021-11-29 ENCOUNTER — Telehealth: Payer: Self-pay

## 2021-11-29 DIAGNOSIS — H401412 Capsular glaucoma with pseudoexfoliation of lens, right eye, moderate stage: Secondary | ICD-10-CM | POA: Diagnosis not present

## 2021-11-29 DIAGNOSIS — H401422 Capsular glaucoma with pseudoexfoliation of lens, left eye, moderate stage: Secondary | ICD-10-CM | POA: Diagnosis not present

## 2021-11-29 DIAGNOSIS — I10 Essential (primary) hypertension: Secondary | ICD-10-CM | POA: Diagnosis not present

## 2021-11-29 DIAGNOSIS — E782 Mixed hyperlipidemia: Secondary | ICD-10-CM | POA: Diagnosis not present

## 2021-11-29 DIAGNOSIS — E1122 Type 2 diabetes mellitus with diabetic chronic kidney disease: Secondary | ICD-10-CM | POA: Diagnosis not present

## 2021-11-29 NOTE — Telephone Encounter (Signed)
Spoke with Aldona Bar with [facility] who requests pt be seen for elevated HR the past 2 days.  Pt hx of PAF and has not been seen by Dr Caryl Comes since 2021.  She also reports pt dropped a jar of jelly on her pacemaker and pt feels pacemaker may have shifted.Pt is scheduled for remote check of PPM/MDT on 12/05/21.  Requested Aldona Bar have pt send a PPM transmission today.  Appointment scheduled appt with Tommye Standard, PA-C for 11/30/2021 at [place] states she will relay the information to the pt.

## 2021-11-29 NOTE — Telephone Encounter (Signed)
Transmission received normal device function.

## 2021-11-29 NOTE — Telephone Encounter (Signed)
Spoke with pt and advised of appointment scheduled with Tommye Standard, PA-C for 11/30/2021 at 8am and confirmed pt will send a remote PPM transmission.  Pt states she does not have time to talk or send transmission at this moment as she is walking out the door to see her "glaucoma doctor."  Pt states she will send when she returns home.

## 2021-11-30 ENCOUNTER — Other Ambulatory Visit: Payer: Self-pay

## 2021-11-30 ENCOUNTER — Encounter: Payer: Self-pay | Admitting: Physician Assistant

## 2021-11-30 ENCOUNTER — Ambulatory Visit: Payer: Medicare Other | Admitting: Physician Assistant

## 2021-11-30 VITALS — BP 116/62 | HR 93 | Ht 61.25 in | Wt 145.0 lb

## 2021-11-30 DIAGNOSIS — R0600 Dyspnea, unspecified: Secondary | ICD-10-CM

## 2021-11-30 DIAGNOSIS — I10 Essential (primary) hypertension: Secondary | ICD-10-CM

## 2021-11-30 DIAGNOSIS — R296 Repeated falls: Secondary | ICD-10-CM

## 2021-11-30 DIAGNOSIS — I5032 Chronic diastolic (congestive) heart failure: Secondary | ICD-10-CM

## 2021-11-30 DIAGNOSIS — Z950 Presence of cardiac pacemaker: Secondary | ICD-10-CM | POA: Diagnosis not present

## 2021-11-30 DIAGNOSIS — I480 Paroxysmal atrial fibrillation: Secondary | ICD-10-CM

## 2021-11-30 LAB — CUP PACEART INCLINIC DEVICE CHECK
Battery Impedance: 1431 Ohm
Battery Remaining Longevity: 54 mo
Battery Voltage: 2.77 V
Brady Statistic AP VP Percent: 3 %
Brady Statistic AP VS Percent: 13 %
Brady Statistic AS VP Percent: 0 %
Brady Statistic AS VS Percent: 84 %
Date Time Interrogation Session: 20230224182848
Implantable Lead Implant Date: 20070710
Implantable Lead Implant Date: 20070710
Implantable Lead Location: 753859
Implantable Lead Location: 753860
Implantable Lead Model: 4469
Implantable Lead Model: 4470
Implantable Lead Serial Number: 483166
Implantable Lead Serial Number: 541525
Implantable Pulse Generator Implant Date: 20130410
Lead Channel Impedance Value: 621 Ohm
Lead Channel Impedance Value: 665 Ohm
Lead Channel Pacing Threshold Amplitude: 0.625 V
Lead Channel Pacing Threshold Amplitude: 0.75 V
Lead Channel Pacing Threshold Amplitude: 0.75 V
Lead Channel Pacing Threshold Amplitude: 0.75 V
Lead Channel Pacing Threshold Pulse Width: 0.4 ms
Lead Channel Pacing Threshold Pulse Width: 0.4 ms
Lead Channel Pacing Threshold Pulse Width: 0.4 ms
Lead Channel Pacing Threshold Pulse Width: 0.4 ms
Lead Channel Sensing Intrinsic Amplitude: 15.67 mV
Lead Channel Sensing Intrinsic Amplitude: 2.8 mV
Lead Channel Setting Pacing Amplitude: 2 V
Lead Channel Setting Pacing Amplitude: 2.5 V
Lead Channel Setting Pacing Pulse Width: 0.4 ms
Lead Channel Setting Sensing Sensitivity: 5.6 mV

## 2021-11-30 NOTE — Patient Instructions (Signed)
Medication Instructions:    Your physician recommends that you continue on your current medications as directed. Please refer to the Current Medication list given to you today.  *If you need a refill on your cardiac medications before your next appointment, please call your pharmacy*   Lab Work: [place]   If you have labs (blood work) drawn today and your tests are completely normal, you will receive your results only by: [place] (if you have MyChart) OR A paper copy in the mail If you have any lab test that is abnormal or we need to change your treatment, we will call you to review the results.   Testing/Procedures: Your physician has requested that you have an echocardiogram. Echocardiography is a painless test that uses sound waves to create images of your heart. It provides your doctor with information about the size and shape of your heart and how well your hearts chambers and valves are working. This procedure takes approximately one hour. There are no restrictions for this procedure.     Follow-Up: At [facility], you and your health needs are our priority.  As part of our continuing mission to provide you with exceptional heart care, we have created designated Provider Care Teams.  These Care Teams include your primary Cardiologist (physician) and Advanced Practice Providers (APPs -  Physician Assistants and Nurse Practitioners) who all work together to provide you with the care you need, when you need it.  We recommend signing up for the patient portal called "MyChart".  Sign up information is provided on this After Visit Summary.  MyChart is used to connect with patients for Virtual Visits (Telemedicine).  Patients are able to view lab/test results, encounter notes, upcoming appointments, etc.  Non-urgent messages can be sent to your provider as well.   To learn more about what you can do with MyChart, go to [email or web address].    Your next  appointment:   1 year(s)  The format for your next appointment:   In Person  Provider:   Virl Axe, MD [place]!!     Other Instructions

## 2021-11-30 NOTE — Progress Notes (Signed)
Cardiology Office Note Date:  11/30/2021  Patient ID:  [name], [name] 28-Apr-1941, MRN [id number] PCP:  Wenda Low, MD  Cardiologist:  Dr. Marlou Porch Electrophysiologist: Dr. Caryl Comes    Chief Complaint: elevated HR noted at PMD  History of Present Illness: Heidi [name] is a 81 y.o. female with history of HTN, HLD, DM, TIA, OSA w/CPAP, hypothyroidism, tachy-brady w/PPM, AFib.  She comes in today to be seen for Dr. Caryl Comes, last seen by him Jan 2021 via telemedicine discussed some orthostatic symptoms and presyncope. Discussed Afib noted on problem list but none observed by her device sine it's implant in 2013.No changes were made with plans to have her daughter (a Therapist, sports) check her orthostatic vitals and communicate the readings  Amlodipine was reduced  More recently saw Dr. Marlou Porch 03/06/21, she had a recent ER visit for dizziness/fall with injury to her face, was participating in PT to help w/strength and balance.  She had not had AFib at that time since 2013 and was not on a/c with plans to monitor via her pacer. Support stockings for edema   her PMD noted elevated HRs and advised to see cardiology  TODAY She explains that her BP cuff automatically sends her readings to her PMD and they noted her HR elevated or maybe irregular and advised she check in here. She had no awareness of her heart beat/rhythm, but sine then perhaps a fleeting flutter here, there No CP She has some SOB on/off, that is not new or changing Some days she has better exertional capacity then others She is compliant with her CPAP She denies dizziness, but does fall She reports 21 falls in 2 years. She is aware of falling but can not recover once she starts to fall  She was reaching up to close her trunk git off balance and fell backwards. She did not black out, was aware she was falling but unable to stop it  Another she lifted her foot up to push in a couch cushion lost her balance and fell  These are pretty  typical kind of scenarios with her falls She will have some occ lightheadedness upon standing but these are not causing falls   Device information MDT dual chamber PPM implanted 04/15/2006, gen change 01/15/2012   Past Medical History:  Diagnosis Date   Anxiety    Atrial fibrillation ([place])    Intermittent   CAD (coronary artery disease)    non obstructive   CKD (chronic kidney disease)    Diabetes mellitus without complication ([place])    Dysrhythmia    PAF   GERD (gastroesophageal reflux disease)    Glaucoma    Narrow Angle   HTN (hypertension)    Hyperlipidemia    Hypothyroid    Major depression    10-17-2010: hospitalized for suicidal, [facility], [place] (D/C 10-30-2010)   Migraine headache    OSA (obstructive sleep apnea)    CPAP nightly   Peripheral neuropathy    Seasonal allergies    Sleep apnea    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    ABDOMINAL HYSTERECTOMY  1976   APPENDECTOMY     BREAST EXCISIONAL BIOPSY     BREAST SURGERY     fibroids removed, benign   CARDIAC CATHETERIZATION  02-12-2006   Minor irregularities: RCA-mid  40%, LM-normal, LAD-normal. EF 65%.   CHOLECYSTECTOMY     ECTOPIC PREGNANCY SURGERY     treatment x3, last one 11-02-2009 at [facility]   goiter     goiter removed     MASS EXCISION Right 10/18/2015   Procedure: EXCISION OF 6CM MASS ON RIGHT  NECK/BACK;  Surgeon: [name], MD;  Location: [facility];  Service: General;  Laterality: Right;   PACEMAKER GENERATOR CHANGE N/A 01/15/2012   Procedure: PACEMAKER GENERATOR CHANGE;  Surgeon: Deboraha Sprang, MD;  Location: [facility] C[facility] LAB;  Service: Cardiovascular;  Laterality: N/A;   PACEMAKER INSERTION     Permanent. Medtronic EnRhyth 04/2006, set as DDDR   TUMOR REMOVAL  2000   Fibroid from  breast x4-most recent   WRIST FRACTURE SURGERY  11-2007   right, had metal plate inserted    Current Outpatient Medications  Medication Sig Dispense Refill   acetaminophen (TYLENOL) 500 MG tablet Take 1,000 mg by mouth daily as needed for headache (pain).     amLODipine (NORVASC) 2.5 MG tablet Take 2.5 mg by mouth at bedtime.     aspirin EC 81 MG tablet Take 81 mg by mouth at bedtime.     benazepril (LOTENSIN) 10 MG tablet Take 10 mg by mouth daily with breakfast.     cetirizine (ZYRTEC) 10 MG tablet Take 10 mg by mouth daily with breakfast.     Cholecalciferol (VITAMIN D-3) 125 MCG (5000 UT) TABS Take 5,000 Units by mouth daily with lunch.     cholestyramine (QUESTRAN) 4 g packet Take 4 g by mouth 2 (two) times daily as needed (diarrhea).  6   fenofibrate 160 MG tablet Take 160 mg by mouth at bedtime. Take with food     ferrous sulfate 325 (65 FE) MG tablet Take 325 mg by mouth daily with lunch.     fluticasone (FLONASE) 50 MCG/ACT nasal spray Place 2 sprays into the nose daily as needed for allergies.      furosemide (LASIX) 20 MG tablet TAKE 1 TABLET BY MOUTH 3 TIMES A WEEK. 45 tablet 3   gabapentin (NEURONTIN) 300 MG capsule Take 300 mg by mouth 3 (three) times daily.     galantamine (RAZADYNE ER) 8 MG 24 hr capsule Take 8 mg by mouth daily with breakfast.  2   hydrOXYzine (ATARAX/VISTARIL) 25 MG tablet Take 50 mg by mouth at bedtime.     levothyroxine (SYNTHROID, LEVOTHROID) 75 MCG tablet Take 75 mcg by mouth daily before breakfast.     metFORMIN (GLUCOPHAGE-XR) 500 MG 24 hr tablet Take 500 mg by mouth in the morning and at bedtime.     metoprolol succinate (TOPROL-XL) 100 MG 24 hr tablet TAKE 1 TABLET(100 MG) BY MOUTH DAILY 90 tablet 3   Multiple Vitamin (MULTIVITAMIN WITH MINERALS) TABS tablet Take 1 tablet by mouth daily with breakfast.     nortriptyline (PAMELOR) 25 MG capsule Take 25 mg by mouth at bedtime. Take with a 75 mg capsule for a total dose of 100 mg nightly      nortriptyline (PAMELOR) 75 MG capsule Take 75 mg by mouth at bedtime. Take with a 25 mg capsule for a total dose of 100 mg nightly     ofloxacin (OCUFLOX) 0.3 % ophthalmic solution Place 1 drop into the left eye 4 (four) times daily.     pantoprazole (PROTONIX) 40 MG tablet Take 40 mg by mouth See admin instructions. Take one tablet (40 mg) by mouth twice daily -  lunch and bedtime     PRESCRIPTION MEDICATION Inhale into the lungs at bedtime. CPAP     spironolactone (ALDACTONE) 25 MG tablet Take 1 tablet (25 mg total) by mouth daily. 30 tablet 11   vitamin C (ASCORBIC ACID) 500 MG tablet Take 500 mg by mouth daily with lunch.     No current facility-administered medications for this visit.    Allergies:   Crestor [rosuvastatin calcium], Effexor [venlafaxine hydrochloride], Hibiclens [chlorhexidine gluconate], Nardil, Penicillins, Adhesive [tape], Clopidogrel, Lipitor [atorvastatin calcium], Plavix [clopidogrel bisulfate], Simvastatin, Sulfonamide derivatives, and Zestril [lisinopril]   Social History:  The patient  reports that she has quit smoking. She has never used smokeless tobacco. She reports that she does not drink alcohol and does not use drugs.   Family History:  The patient's family history includes Atrial fibrillation in her brother; Stomach cancer in her father.  ROS:  Please see the history of present illness.    All other systems are reviewed and otherwise negative.   PHYSICAL EXAM:  VS:  There were no vitals taken for this visit. BMI: There is no height or weight on file to calculate BMI. Well nourished, well developed, in no acute distress HEENT: normocephalic, atraumatic Neck: no JVD, carotid bruits or masses Cardiac:  RRR; no significant murmurs, no rubs, or gallops Lungs:  CTA b/l, no wheezing, rhonchi or rales Abd: soft, nontender MS: no deformity or atrophy Ext: trace-1+ edema (she reports as chronic) Skin: warm and dry, no rash Neuro:  No gross deficits  appreciated Psych: euthymic mood, full affect  PPM site is stable, no tethering or discomfort   EKG:  Done today and reviewed by myself shows  SR 92bpm, LAD  Device interrogation done today and reviewed by myself:  Battery and lead measurements are good 3 NSVT in the last year, longest 7 seconds, V rates 154-175 AS/VS 84.1%  06/21/2016: TTE Study Conclusions  - Left ventricle: The cavity size was normal. Wall thickness was    normal. Systolic function was normal. The estimated ejection    fraction was in the range of 55% to 60%. Wall motion was normal;    there were no regional wall motion abnormalities. Doppler    parameters are consistent with abnormal left ventricular    relaxation (grade 1 diastolic dysfunction). Doppler parameters    are consistent with high ventricular filling pressure.  - Left atrium: The atrium was mildly dilated.   Impressions:   - Normal LV systolic function; grade 1 diastolic dysfunction;    elevated LV filling pressure; mild LAE; trace MR and TR.   Cardiac catheterization was performed on 02/12/2006 which showed minor irregularities, mid RCA 40%, left main normal, LAD normal, ejection fraction 65%.   9/17 Echo normal LV function  12/18 Myoview without ischemia   Recent Labs: 12/25/2020: BUN 18; Creatinine, Ser 0.91; Hemoglobin 13.4; Platelets 256; Potassium 4.0; Sodium 139  No results found for requested labs within last 8760 hours.   CrCl cannot be calculated (Patient's most recent lab result is older than the maximum 21 days allowed.).   Wt Readings from Last 3 Encounters:  03/06/21 141 lb (64 kg)  12/25/20 142 lb (64.4 kg)  03/08/20 142 lb (64.4 kg)     Other studies reviewed: Additional studies/records reviewed today include: summarized above  ASSESSMENT AND PLAN:  PPM Intact function, no programming changes made  HTN Orthostatic hypotension No significant orthostatic symptoms  AFib None so far  5. Falls They are not  orthostatic, lightheaded,  or syncope She will c/w her PMD Pending to get PT started back up Discussed safety  6. Tachycardia HR 90's, she says this is usual for her HR histograms look pretty good (histograms going back to 2020) does not spend much time >100  7. Some intermittent SOB Edema described as chronic Will update her echo   Disposition: F/u with remotes as usual and in clinic with EP in a year, sooner if needed  Current medicines are reviewed at length with the patient today.  The patient did not have any concerns regarding medicines.  Heidi Night, PA-C 11/30/2021 3:37 AM     [facility] [place] [address] [phone] (office)  [phone] (fax)

## 2021-12-05 ENCOUNTER — Ambulatory Visit (INDEPENDENT_AMBULATORY_CARE_PROVIDER_SITE_OTHER): Payer: Medicare Other

## 2021-12-05 DIAGNOSIS — I495 Sick sinus syndrome: Secondary | ICD-10-CM | POA: Diagnosis not present

## 2021-12-06 LAB — CUP PACEART REMOTE DEVICE CHECK
Battery Impedance: 1431 Ohm
Battery Remaining Longevity: 54 mo
Battery Voltage: 2.76 V
Brady Statistic AP VP Percent: 4 %
Brady Statistic AP VS Percent: 13 %
Brady Statistic AS VP Percent: 0 %
Brady Statistic AS VS Percent: 83 %
Date Time Interrogation Session: 20230301084712
Implantable Lead Implant Date: 20070710
Implantable Lead Implant Date: 20070710
Implantable Lead Location: 753859
Implantable Lead Location: 753860
Implantable Lead Model: 4469
Implantable Lead Model: 4470
Implantable Lead Serial Number: 483166
Implantable Lead Serial Number: 541525
Implantable Pulse Generator Implant Date: 20130410
Lead Channel Impedance Value: 641 Ohm
Lead Channel Impedance Value: 700 Ohm
Lead Channel Pacing Threshold Amplitude: 0.625 V
Lead Channel Pacing Threshold Amplitude: 0.75 V
Lead Channel Pacing Threshold Pulse Width: 0.4 ms
Lead Channel Pacing Threshold Pulse Width: 0.4 ms
Lead Channel Setting Pacing Amplitude: 2 V
Lead Channel Setting Pacing Amplitude: 2.5 V
Lead Channel Setting Pacing Pulse Width: 0.4 ms
Lead Channel Setting Sensing Sensitivity: 5.6 mV

## 2021-12-11 ENCOUNTER — Other Ambulatory Visit: Payer: Self-pay

## 2021-12-11 ENCOUNTER — Ambulatory Visit (HOSPITAL_COMMUNITY): Payer: Medicare Other | Attending: Cardiovascular Disease

## 2021-12-11 DIAGNOSIS — R0609 Other forms of dyspnea: Secondary | ICD-10-CM

## 2021-12-11 DIAGNOSIS — R0600 Dyspnea, unspecified: Secondary | ICD-10-CM | POA: Diagnosis not present

## 2021-12-11 LAB — ECHOCARDIOGRAM COMPLETE
Area-P 1/2: 4.05 cm2
S' Lateral: 1.1 cm

## 2021-12-14 NOTE — Progress Notes (Signed)
Remote pacemaker transmission.   

## 2021-12-17 ENCOUNTER — Other Ambulatory Visit: Payer: Self-pay

## 2021-12-17 ENCOUNTER — Ambulatory Visit: Payer: Medicare Other | Admitting: Neurology

## 2021-12-17 ENCOUNTER — Encounter: Payer: Self-pay | Admitting: Neurology

## 2021-12-17 ENCOUNTER — Other Ambulatory Visit (INDEPENDENT_AMBULATORY_CARE_PROVIDER_SITE_OTHER): Payer: Medicare Other

## 2021-12-17 VITALS — BP 128/73 | HR 94 | Ht 61.25 in | Wt 144.0 lb

## 2021-12-17 DIAGNOSIS — H532 Diplopia: Secondary | ICD-10-CM | POA: Diagnosis not present

## 2021-12-17 DIAGNOSIS — H02403 Unspecified ptosis of bilateral eyelids: Secondary | ICD-10-CM | POA: Diagnosis not present

## 2021-12-17 NOTE — Patient Instructions (Signed)
We will check labs.  If they are normal, then the next step would be to send you to [facility] for single fiber EMG ?

## 2021-12-17 NOTE — Progress Notes (Signed)
[facility] Clinic Note - Initial Visit   Date: 12/17/21  Heidi [name] MRN: [id number] DOB: 22-Jul-1941   Dear Dr. Lysle Rubens:  Thank you for your kind referral of Heidi [name] for consultation of ptosis. Although her history is well known to you, please allow [place] to reiterate it for the purpose of our medical record. The patient was accompanied to the clinic by self.  History of Present Illness: Heidi [name] is a 81 y.o. 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   Late 2022, she began noticing bilateral droopiness of the eyelids, which start around 11am and remains constant throughout the day.  There is no worsening by evening.  She also complains of double vision with images side-by-side, which is intermittent throughout.  No difficulty with swallowing, talking, shortness of breath, or limb weakness.    She has known diabetic neuropathy of the feet and lower legs. She walks with a walker for the past several years.  She falls frequently and tells me she has fallen 23 times over the past 2 years.   She lives alone in a one-level home.  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   She is also seeing [facility]. Heather [place] [facility].     Past Medical History:  Diagnosis Date   Anxiety    Atrial fibrillation ([place])    Intermittent   CAD (coronary artery disease)    non obstructive   CKD (chronic kidney disease)    Diabetes mellitus without complication ([place])    Dysrhythmia    PAF   GERD (gastroesophageal reflux disease)    Glaucoma    Narrow Angle   HTN (hypertension)    Hyperlipidemia    Hypothyroid    Major depression    10-17-2010: hospitalized for suicidal, [facility]ly, [place] (D/C 10-30-2010)   Migraine headache    OSA (obstructive sleep apnea)    CPAP nightly    Peripheral neuropathy    Seasonal allergies    Sleep apnea    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    ABDOMINAL HYSTERECTOMY  1976   APPENDECTOMY     BREAST EXCISIONAL BIOPSY     BREAST SURGERY     fibroids removed, benign   CARDIAC CATHETERIZATION  02-12-2006   Minor irregularities: RCA-mid 40%, LM-normal, LAD-normal. EF 65%.   CHOLECYSTECTOMY     ECTOPIC PREGNANCY SURGERY     treatment x3, last one 11-02-2009 at [facility]   goiter     goiter removed     MASS EXCISION Right 10/18/2015   Procedure: EXCISION OF 6CM MASS ON RIGHT  NECK/BACK;  Surgeon: [name], MD;  Location: [facility];  Service: General;  Laterality: Right;   PACEMAKER GENERATOR CHANGE N/A 01/15/2012   Procedure: PACEMAKER GENERATOR CHANGE;  Surgeon: Deboraha Sprang, MD;  Location: [facility] C[facility] LAB;  Service: Cardiovascular;  Laterality: N/A;   PACEMAKER INSERTION     Permanent. Medtronic EnRhyth 04/2006, set as DDDR   TUMOR REMOVAL  2000   Fibroid from breast x4-most recent   WRIST FRACTURE SURGERY  11-2007   right, had metal plate inserted     Medications:  Outpatient Encounter  Medications as of 12/17/2021  Medication Sig   acetaminophen (TYLENOL) 500 MG tablet Take 1,000 mg by mouth daily as needed for headache (pain).   aspirin EC 81 MG tablet Take 81 mg by mouth at bedtime.   benazepril (LOTENSIN) 10 MG tablet Take 10 mg by mouth daily with breakfast.   cetirizine (ZYRTEC) 10 MG tablet Take 10 mg by mouth daily with breakfast.   Cholecalciferol (VITAMIN D-3) 125 MCG (5000 UT) TABS Take 5,000 Units by mouth daily with lunch.   cholestyramine (QUESTRAN) 4 g packet Take 4 g by mouth 2 (two) times daily as needed (diarrhea).   fenofibrate 160 MG tablet Take 160 mg by mouth at bedtime. Take with food   ferrous  sulfate 325 (65 FE) MG tablet Take 325 mg by mouth daily with lunch.   fluticasone (FLONASE) 50 MCG/ACT nasal spray Place 2 sprays into the nose daily as needed for allergies.    furosemide (LASIX) 20 MG tablet TAKE 1 TABLET BY MOUTH 3 TIMES A WEEK.   gabapentin (NEURONTIN) 300 MG capsule Take 300 mg by mouth 2 (two) times daily.   galantamine (RAZADYNE ER) 8 MG 24 hr capsule Take 8 mg by mouth daily with breakfast.   hydrOXYzine (ATARAX/VISTARIL) 25 MG tablet Take 50 mg by mouth at bedtime.   levothyroxine (SYNTHROID, LEVOTHROID) 75 MCG tablet Take 75 mcg by mouth daily before breakfast.   metFORMIN (GLUCOPHAGE-XR) 500 MG 24 hr tablet Take 500 mg by mouth in the morning and at bedtime.   metoprolol succinate (TOPROL-XL) 100 MG 24 hr tablet TAKE 1 TABLET(100 MG) BY MOUTH DAILY   Multiple Vitamin (MULTIVITAMIN WITH MINERALS) TABS tablet Take 1 tablet by mouth daily with breakfast.   nortriptyline (PAMELOR) 25 MG capsule Take 25 mg by mouth at bedtime. Take with a 75 mg capsule for a total dose of 100 mg nightly   nortriptyline (PAMELOR) 75 MG capsule Take 75 mg by mouth at bedtime. Take with a 25 mg capsule for a total dose of 100 mg nightly   pantoprazole (PROTONIX) 40 MG tablet Take 40 mg by mouth See admin instructions. Take one tablet (40 mg) by mouth twice daily - lunch and bedtime   PRESCRIPTION MEDICATION Inhale into the lungs at bedtime. CPAP   spironolactone (ALDACTONE) 25 MG tablet Take 1 tablet (25 mg total) by mouth daily.   vitamin C (ASCORBIC ACID) 500 MG tablet Take 500 mg by mouth daily with lunch.   No facility-administered encounter medications on file as of 12/17/2021.    Allergies:  Allergies  Allergen Reactions   Crestor [Rosuvastatin Calcium] Other (See Comments)    Caused Myalgias   Effexor [Venlafaxine Hydrochloride] Other (See Comments)    Caused BP to go up   Adhesive [Tape] Rash   Clopidogrel Rash   Hibiclens [Chlorhexidine Gluconate] Hives   Lipitor  [Atorvastatin Calcium] Rash   Nardil Other (See Comments)    Caused BP Problems   Penicillins Swelling   Plavix [Clopidogrel Bisulfate] Rash   Simvastatin Rash   Sulfonamide Derivatives Rash   Zestril [Lisinopril] Rash    Family History: Family History  Problem Relation Age of Onset   Stomach cancer Father    Atrial fibrillation Brother     Social History: Social History   Tobacco Use   Smoking status: Former   Smokeless tobacco: Never  Scientific laboratory technician Use: Never used  Substance Use Topics   Alcohol use: No   Drug use: No  Social History   Social History Narrative   Right Handed    Lives in a one story home    Vital Signs:  BP 128/73    Pulse 94    Ht 5' 1.25" (1.556 m)    Wt 144 lb (65.3 kg)    SpO2 100%    BMI 26.99 kg/m   Neurological Exam: MENTAL STATUS including orientation to time, place, person, recent and remote memory, attention span and concentration, language, and fund of knowledge is normal.  Speech is not dysarthric.  CRANIAL NERVES: II:  No visual field defects.   III-IV-VI: Pupils equal round and reactive to light.  Normal conjugate, extra-ocular eye movements in all directions of gaze.  No nystagmus. Mild bilateral ptosis, eyelids above the level of the pupil, no worsening with sustained upgaze.   V:  Normal facial sensation.    VII:  Normal facial symmetry and movements.  Orbicularis oculi, orbicularis oris, and buccinator 5/5 VIII:  Normal hearing and vestibular function.   IX-X:  Normal palatal movement.   XI:  Normal shoulder shrug and head rotation.   XII:  Normal tongue strength and range of motion, no deviation or fasciculation.  MOTOR:  Motor strength is 5/5 throughout in the arms and legs. No muscle fatigability No atrophy, fasciculations or abnormal movements.  No pronator drift.   MSRs:  Right        Left                  brachioradialis 2+  2+  biceps 2+  2+  triceps 2+  2+  patellar 2+  2+  ankle jerk 2+  2+  Hoffman no   no  plantar response down  down   SENSORY:  Gradient pattern of sensory loss distal to mid-calf bilaterally to pin prick, temperature and vibration.    COORDINATION/GAIT: Normal finger-to- nose-finger.  Intact rapid alternating movements bilaterally.  Gait narrow based and stable, assisted with rollator.     IMPRESSION: Bilateral ptosis and diplopia, will evaluate for ocular myasthenia gravis.  History of not classic presentation as she denies fluctuating symptoms Check AChR and MUSK antibodies If labs are normal, next step is single fiber EMG  2.  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  If I can answer any additional questions, I would be pleased to do so.    Sincerely,    Vanna Shavers K. Posey Pronto, DO

## 2021-12-18 DIAGNOSIS — E119 Type 2 diabetes mellitus without complications: Secondary | ICD-10-CM | POA: Diagnosis not present

## 2021-12-21 DIAGNOSIS — E1122 Type 2 diabetes mellitus with diabetic chronic kidney disease: Secondary | ICD-10-CM | POA: Diagnosis not present

## 2021-12-21 DIAGNOSIS — I10 Essential (primary) hypertension: Secondary | ICD-10-CM | POA: Diagnosis not present

## 2021-12-21 DIAGNOSIS — E782 Mixed hyperlipidemia: Secondary | ICD-10-CM | POA: Diagnosis not present

## 2022-01-03 LAB — MYASTHENIA GRAVIS PANEL 2
A CHR BINDING ABS: <0.3 nmol/L
ACHR Blocking Abs: <15 % Inhibition (ref <15)
Acetylchol Modul Ab: 17 % Inhibition

## 2022-01-04 DIAGNOSIS — I10 Essential (primary) hypertension: Secondary | ICD-10-CM | POA: Diagnosis not present

## 2022-01-07 ENCOUNTER — Emergency Department (HOSPITAL_COMMUNITY): Payer: Medicare Other

## 2022-01-07 ENCOUNTER — Other Ambulatory Visit: Payer: Self-pay

## 2022-01-07 ENCOUNTER — Emergency Department (HOSPITAL_COMMUNITY)
Admission: EM | Admit: 2022-01-07 | Discharge: 2022-01-07 | Disposition: A | Discharge status: Home / Self Care | Payer: Medicare Other | Attending: Emergency Medicine | Admitting: Emergency Medicine

## 2022-01-07 DIAGNOSIS — I129 Hypertensive chronic kidney disease with stage 1 through stage 4 chronic kidney disease, or unspecified chronic kidney disease: Secondary | ICD-10-CM | POA: Diagnosis not present

## 2022-01-07 DIAGNOSIS — I2510 Atherosclerotic heart disease of native coronary artery without angina pectoris: Secondary | ICD-10-CM | POA: Insufficient documentation

## 2022-01-07 DIAGNOSIS — S43014A Anterior dislocation of right humerus, initial encounter: Secondary | ICD-10-CM | POA: Diagnosis not present

## 2022-01-07 DIAGNOSIS — S022XXA Fracture of nasal bones, initial encounter for closed fracture: Secondary | ICD-10-CM | POA: Insufficient documentation

## 2022-01-07 DIAGNOSIS — Z7982 Long term (current) use of aspirin: Secondary | ICD-10-CM | POA: Insufficient documentation

## 2022-01-07 DIAGNOSIS — E039 Hypothyroidism, unspecified: Secondary | ICD-10-CM | POA: Insufficient documentation

## 2022-01-07 DIAGNOSIS — W01198A Fall on same level from slipping, tripping and stumbling with subsequent striking against other object, initial encounter: Secondary | ICD-10-CM | POA: Diagnosis not present

## 2022-01-07 DIAGNOSIS — S4991XA Unspecified injury of right shoulder and upper arm, initial encounter: Secondary | ICD-10-CM | POA: Diagnosis present

## 2022-01-07 DIAGNOSIS — Z743 Need for continuous supervision: Secondary | ICD-10-CM | POA: Diagnosis not present

## 2022-01-07 DIAGNOSIS — S4291XA Fracture of right shoulder girdle, part unspecified, initial encounter for closed fracture: Secondary | ICD-10-CM

## 2022-01-07 DIAGNOSIS — M19011 Primary osteoarthritis, right shoulder: Secondary | ICD-10-CM | POA: Diagnosis not present

## 2022-01-07 DIAGNOSIS — S43011A Anterior subluxation of right humerus, initial encounter: Secondary | ICD-10-CM | POA: Diagnosis not present

## 2022-01-07 DIAGNOSIS — S0083XA Contusion of other part of head, initial encounter: Secondary | ICD-10-CM | POA: Insufficient documentation

## 2022-01-07 DIAGNOSIS — Z79899 Other long term (current) drug therapy: Secondary | ICD-10-CM | POA: Diagnosis not present

## 2022-01-07 DIAGNOSIS — Z950 Presence of cardiac pacemaker: Secondary | ICD-10-CM | POA: Insufficient documentation

## 2022-01-07 DIAGNOSIS — Z7984 Long term (current) use of oral hypoglycemic drugs: Secondary | ICD-10-CM | POA: Insufficient documentation

## 2022-01-07 DIAGNOSIS — M25519 Pain in unspecified shoulder: Secondary | ICD-10-CM | POA: Diagnosis not present

## 2022-01-07 DIAGNOSIS — S43004A Unspecified dislocation of right shoulder joint, initial encounter: Secondary | ICD-10-CM | POA: Diagnosis not present

## 2022-01-07 DIAGNOSIS — E1122 Type 2 diabetes mellitus with diabetic chronic kidney disease: Secondary | ICD-10-CM | POA: Insufficient documentation

## 2022-01-07 DIAGNOSIS — W19XXXA Unspecified fall, initial encounter: Secondary | ICD-10-CM | POA: Diagnosis not present

## 2022-01-07 DIAGNOSIS — Y9301 Activity, walking, marching and hiking: Secondary | ICD-10-CM | POA: Diagnosis not present

## 2022-01-07 DIAGNOSIS — I4891 Unspecified atrial fibrillation: Secondary | ICD-10-CM | POA: Insufficient documentation

## 2022-01-07 DIAGNOSIS — J9811 Atelectasis: Secondary | ICD-10-CM | POA: Diagnosis not present

## 2022-01-07 DIAGNOSIS — Y92009 Unspecified place in unspecified non-institutional (private) residence as the place of occurrence of the external cause: Secondary | ICD-10-CM | POA: Diagnosis not present

## 2022-01-07 DIAGNOSIS — N189 Chronic kidney disease, unspecified: Secondary | ICD-10-CM | POA: Insufficient documentation

## 2022-01-07 DIAGNOSIS — R6889 Other general symptoms and signs: Secondary | ICD-10-CM | POA: Diagnosis not present

## 2022-01-07 DIAGNOSIS — S0003XA Contusion of scalp, initial encounter: Secondary | ICD-10-CM | POA: Diagnosis not present

## 2022-01-07 MED ORDER — LIDOCAINE-EPINEPHRINE (PF) 2 %-1:200000 IJ SOLN
10.0000 mL | Freq: Once | INTRAMUSCULAR | Status: AC
  Administered 2022-01-07: 10 mL
  Filled 2022-01-07: qty 20

## 2022-01-07 NOTE — Progress Notes (Signed)
.  Transition of Care [facility]) - Emergency Department Mini Assessment ? ? ?Patient Details  ?Name: Heidi [name] ?MRN: [id number] ?Date of Birth: 03/17/1941 ? ?Transition of Care (TOC) CM/SW Contact:    ?Arlie Solomons Konstantinos Cordoba, LCSW ?Phone Number: ?01/07/2022, 1:45 PM ? ? ?Clinical Narrative: ? ?Pt present to the ED with multiple falls last one being today. Pt stated she tripped and fell yesterday and fell today hurting her shoulder. CSW spoke with pt and pt's daughter at bedside. [name] stated pt had multiple falls and the family is now looking for more information on assistant living facilities. CSW made a referral to [facility] to help the family navigate assistant living placement. . Pt stated se uses a walker at baseline, and does not need any other Equipment. Pt decline HH services as well. TOC sign off. ?  ? ?ED Mini Assessment: ?What brought you to the Emergency Department? : fall ? ?Barriers to Discharge: Continued Medical Work up ? ?  ? ?  ? ?Interventions which prevented an admission or readmission: Other (must enter comment) (ALF resource) ? ? ? ?Patient Contact and Communications ?  ?  ?  ? ,     ?  ?  ? ?  ?  ?  ? ?Admission diagnosis:  fall;shoulder injury ?Patient Active Problem List  ? Diagnosis Date Noted  ? HLD (hyperlipidemia) 05/30/2017  ? GERD (gastroesophageal reflux disease) 05/30/2017  ? UTI (urinary tract infection) 05/30/2017  ? Acute metabolic encephalopathy 75/91/6384  ? Type II diabetes mellitus with renal manifestations ([place]) 05/30/2017  ? Hypothyroidism 05/30/2017  ? Depression 05/30/2017  ? CAD (coronary artery disease) 05/30/2017  ? CKD (chronic kidney disease), stage III ([place]) 05/30/2017  ? Chronic diastolic CHF (congestive heart failure) ([place]) 05/30/2017  ? Dementia ([place]) 05/30/2017  ? Essential hypertension, benign 06/15/2014  ? Allergic reaction 04/28/2012  ? Tachycardia-bradycardia ([place]) 12/24/2011  ? Pacemaker-mdt 12/24/2011  ? HTN (hypertension) 12/24/2011  ? A-fib  ([place]) 11/07/2011  ? ?PCP:  Wenda Low, MD ?Pharmacy:   ?[id number] [address] AT [facility] OF [address] & [address] ?[place] ?[place] [address] ?Phone: [phone] Fax: [phone] ?  ?

## 2022-01-07 NOTE — ED Triage Notes (Signed)
Ems brings pt in from home for right shoulder pain. Pt states she was walking to the door and tripped over a rug. Denies LOC. Pt reports right shoulder and right knee pain. ?

## 2022-01-07 NOTE — Discharge Instructions (Signed)
Wear the sling for the next week but then you will want to start doing range of motion exercises.  It will be important to follow-up with orthopedics but this.  There is no evidence of bleeding in your brain today but you do have a broken nose.  He will be very bruised with black eyes.  You can take Tylenol as needed for discomfort and use an ice pack on your shoulder. ?

## 2022-01-07 NOTE — Progress Notes (Signed)
Orthopedic Tech Progress Note ?Patient Details:  ?Heidi [name] ?12-Oct-1940 ?[id number] ? ?Ortho Devices ?Type of Ortho Device: Shoulder immobilizer ?Ortho Device/Splint Location: right ?Ortho Device/Splint Interventions: Ordered, Application, Adjustment ?  ?Post Interventions ?Patient Tolerated: Well ? ?Heidi [name] ?01/07/2022, 3:37 PM ?Applied shoulder immobilizer ?

## 2022-01-07 NOTE — ED Provider Notes (Signed)
?[facility] DEPT ?Provider Note ? ? ?CSN: [id number] ?Arrival date & time: 01/07/22  1159 ? ?  ? ?History ? ?Chief Complaint  ?Patient presents with  ? Shoulder Injury  ? ? ?Heidi [name] is a 81 y.o. female. ? ?Patient is an 81 year old female with a history of depression, peripheral neuropathy, GERD, hypothyroidism, intermittent atrial fibrillation, CAD, tachybradycardia syndrome status post pacemaker, diabetes, CKD, hypertension and hyperlipidemia who gets around her home by using a walker who is presenting today after a fall at home.  She reports she was going to the door to get it and she stepped away from her walker and fell face first into the door and her arm hit the floor.  She has had severe 10 out of 10 pain in her right shoulder since the fall and not been able to move it.  She has had prior injury to her right wrist with plates and surgery but no prior issue with her shoulder.  She denies chest pain or feeling short of breath.  Initially she reported she was having some pain in her right knee but that has resolved.  She is having some mild pain in her face but denies any neck pain.  She does take an 81 mg aspirin daily but denies any other anticoagulation.  She did not lose consciousness today. ? ?The history is provided by the patient and the EMS personnel.  ?Shoulder Injury ? ? ?  ? ?Home Medications ?Prior to Admission medications   ?Medication Sig Start Date End Date Taking? Authorizing Provider  ?acetaminophen (TYLENOL) 500 MG tablet Take 1,000 mg by mouth daily as needed for headache (pain).    [provider]  ?aspirin EC 81 MG tablet Take 81 mg by mouth at bedtime. 12/24/11   Deboraha Sprang, MD  ?benazepril (LOTENSIN) 10 MG tablet Take 10 mg by mouth daily with breakfast.    [provider]  ?cetirizine (ZYRTEC) 10 MG tablet Take 10 mg by mouth daily with breakfast.    [provider]  ?Cholecalciferol (VITAMIN D-3) 125 MCG (5000 UT) TABS  Take 5,000 Units by mouth daily with lunch.    [provider]  ?cholestyramine (QUESTRAN) 4 g packet Take 4 g by mouth 2 (two) times daily as needed (diarrhea). 08/13/18   [provider]  ?fenofibrate 160 MG tablet Take 160 mg by mouth at bedtime. Take with food 12/30/18   [provider]  ?ferrous sulfate 325 (65 FE) MG tablet Take 325 mg by mouth daily with lunch.    [provider]  ?fluticasone (FLONASE) 50 MCG/ACT nasal spray Place 2 sprays into the nose daily as needed for allergies.     [provider]  ?furosemide (LASIX) 20 MG tablet TAKE 1 TABLET BY MOUTH 3 TIMES A WEEK. 05/07/21   Jerline Pain, MD  ?gabapentin (NEURONTIN) 300 MG capsule Take 300 mg by mouth 2 (two) times daily.    [provider]  ?galantamine (RAZADYNE ER) 8 MG 24 hr capsule Take 8 mg by mouth daily with breakfast. 08/13/18   [provider]  ?hydrOXYzine (ATARAX/VISTARIL) 25 MG tablet Take 50 mg by mouth at bedtime.    [provider]  ?levothyroxine (SYNTHROID, LEVOTHROID) 75 MCG tablet Take 75 mcg by mouth daily before breakfast.    [provider]  ?metFORMIN (GLUCOPHAGE-XR) 500 MG 24 hr tablet Take 500 mg by mouth in the morning and at bedtime. 10/05/20   [provider]  ?  metoprolol succinate (TOPROL-XL) 100 MG 24 hr tablet TAKE 1 TABLET(100 MG) BY MOUTH DAILY 04/25/21   Jerline Pain, MD  ?Multiple Vitamin (MULTIVITAMIN WITH MINERALS) TABS tablet Take 1 tablet by mouth daily with breakfast.    [provider]  ?nortriptyline (PAMELOR) 25 MG capsule Take 25 mg by mouth at bedtime. Take with a 75 mg capsule for a total dose of 100 mg nightly 12/05/20   [provider]  ?nortriptyline (PAMELOR) 75 MG capsule Take 75 mg by mouth at bedtime. Take with a 25 mg capsule for a total dose of 100 mg nightly    [provider]  ?pantoprazole (PROTONIX) 40 MG tablet Take 40 mg by mouth See admin instructions. Take one tablet (40  mg) by mouth twice daily - lunch and bedtime 06/01/18   [provider]  ?[employer] into the lungs at bedtime. CPAP    [provider]  ?spironolactone (ALDACTONE) 25 MG tablet Take 1 tablet (25 mg total) by mouth daily. 10/03/17   Deboraha Sprang, MD  ?vitamin C (ASCORBIC ACID) 500 MG tablet Take 500 mg by mouth daily with lunch.    [provider]  ?   ? ?Allergies    ?Crestor [rosuvastatin calcium], Effexor [venlafaxine hydrochloride], Adhesive [tape], Clopidogrel, Hibiclens [chlorhexidine gluconate], Lipitor [atorvastatin calcium], Nardil, Penicillins, Plavix [clopidogrel bisulfate], Simvastatin, Sulfonamide derivatives, and Zestril [lisinopril]   ? ?Review of Systems   ?Review of Systems ? ?Physical Exam ?Updated Vital Signs ?BP 138/71   Pulse 99   Temp 98.1 ?F (36.7 ?C) (Oral)   Resp (!) 25   Ht 5' 1.25" (1.556 m)   Wt 63.5 kg   SpO2 95%   BMI 26.24 kg/m?  ?Physical Exam ?Vitals and nursing note reviewed.  ?Constitutional:   ?   General: She is in acute distress.  ?   Appearance: She is well-developed.  ?HENT:  ?   Head: Normocephalic and atraumatic.  ? ?Eyes:  ?   Pupils: Pupils are equal, round, and reactive to light.  ?Cardiovascular:  ?   Rate and Rhythm: Normal rate and regular rhythm.  ?   Pulses: Normal pulses.  ?   Heart sounds: Normal heart sounds. No murmur heard. ?  No friction rub.  ?Pulmonary:  ?   Effort: Pulmonary effort is normal.  ?   Breath sounds: Normal breath sounds. No wheezing or rales.  ?   Comments: Pacemaker present in the right upper chest ?Abdominal:  ?   General: Bowel sounds are normal. There is no distension.  ?   Palpations: Abdomen is soft.  ?   Tenderness: There is no abdominal tenderness. There is no guarding or rebound.  ?Musculoskeletal:     ?   General: Tenderness present.  ?   Right shoulder: Deformity and tenderness present. Decreased range of motion.  ?   Right elbow: Normal.  ?   Right wrist: Normal.  ?   Cervical  back: Normal range of motion and neck supple.  ?   Comments: Right knee with full range of motion and no tenderness.  ?Skin: ?   General: Skin is warm and dry.  ?   Findings: No rash.  ?Neurological:  ?   Mental Status: She is alert and oriented to person, place, and time.  ?   Cranial Nerves: No cranial nerve deficit.  ?Psychiatric:     ?   Behavior: Behavior normal.  ? ? ?ED Results / Procedures / Treatments   ?  Labs ?(all labs ordered are listed, but only abnormal results are displayed) ?Labs Reviewed - No data to display ? ?EKG ?None ? ?Radiology ?DG Shoulder Right ? ?Result Date: 01/07/2022 ?CLINICAL DATA:  Status post reduction of right shoulder dislocation. EXAM: RIGHT SHOULDER - 2+ VIEW COMPARISON:  Same day. FINDINGS: Anterior dislocation of right glenohumeral joint noted on prior exam has been successfully reduced. Moderate degenerative changes seen involving the right acromioclavicular joint. IMPRESSION: Successful reduction of previously described right glenohumeral joint dislocation. Electronically Signed   By: Marijo Conception M.D.   On: 01/07/2022 14:09  ? ?DG Shoulder Right ? ?Result Date: 01/07/2022 ?CLINICAL DATA:  Pain and deformity, fall, initial encounter. EXAM: RIGHT SHOULDER - 2+ VIEW COMPARISON:  None. FINDINGS: Anterior dislocation of the right humeral head with respect to the glenoid. No definite fracture. Degenerative changes in the right acromioclavicular joint. Visualized right chest shows right perihilar atelectasis. IMPRESSION: Anterior right shoulder dislocation. Electronically Signed   By: Lorin Picket M.D.   On: 01/07/2022 12:49  ? ?CT Head Wo Contrast ? ?Result Date: 01/07/2022 ?CLINICAL DATA:  Head trauma.  Fall. EXAM: CT HEAD WITHOUT CONTRAST CT MAXILLOFACIAL WITHOUT CONTRAST TECHNIQUE: Multidetector CT imaging of the head and maxillofacial structures were performed using the standard protocol without intravenous contrast. Multiplanar CT image reconstructions of the maxillofacial  structures were also generated. RADIATION DOSE REDUCTION: This exam was performed according to the departmental dose-optimization program which includes automated exposure control, adjustment of the mA and/or kV accor

## 2022-01-08 ENCOUNTER — Telehealth: Payer: Self-pay | Admitting: Neurology

## 2022-01-08 DIAGNOSIS — R269 Unspecified abnormalities of gait and mobility: Secondary | ICD-10-CM | POA: Diagnosis not present

## 2022-01-08 DIAGNOSIS — S0083XA Contusion of other part of head, initial encounter: Secondary | ICD-10-CM | POA: Diagnosis not present

## 2022-01-08 DIAGNOSIS — S022XXA Fracture of nasal bones, initial encounter for closed fracture: Secondary | ICD-10-CM | POA: Diagnosis not present

## 2022-01-08 DIAGNOSIS — S43006A Unspecified dislocation of unspecified shoulder joint, initial encounter: Secondary | ICD-10-CM | POA: Diagnosis not present

## 2022-01-08 NOTE — Telephone Encounter (Signed)
Pt's daughter called in and left a message with the access nurse stating they were returning a voicemail about lab results ?

## 2022-01-09 DIAGNOSIS — S2231XA Fracture of one rib, right side, initial encounter for closed fracture: Secondary | ICD-10-CM | POA: Diagnosis not present

## 2022-01-09 DIAGNOSIS — S43004A Unspecified dislocation of right shoulder joint, initial encounter: Secondary | ICD-10-CM | POA: Diagnosis not present

## 2022-01-15 ENCOUNTER — Other Ambulatory Visit: Payer: Self-pay | Admitting: Internal Medicine

## 2022-01-15 DIAGNOSIS — M8580 Other specified disorders of bone density and structure, unspecified site: Secondary | ICD-10-CM

## 2022-01-15 DIAGNOSIS — S46011A Strain of muscle(s) and tendon(s) of the rotator cuff of right shoulder, initial encounter: Secondary | ICD-10-CM | POA: Diagnosis not present

## 2022-01-24 DIAGNOSIS — E559 Vitamin D deficiency, unspecified: Secondary | ICD-10-CM | POA: Diagnosis not present

## 2022-01-24 DIAGNOSIS — H409 Unspecified glaucoma: Secondary | ICD-10-CM | POA: Diagnosis not present

## 2022-01-24 DIAGNOSIS — G4733 Obstructive sleep apnea (adult) (pediatric): Secondary | ICD-10-CM | POA: Diagnosis not present

## 2022-01-24 DIAGNOSIS — R600 Localized edema: Secondary | ICD-10-CM | POA: Diagnosis not present

## 2022-01-24 DIAGNOSIS — G629 Polyneuropathy, unspecified: Secondary | ICD-10-CM | POA: Diagnosis not present

## 2022-01-24 DIAGNOSIS — E119 Type 2 diabetes mellitus without complications: Secondary | ICD-10-CM | POA: Diagnosis not present

## 2022-01-24 DIAGNOSIS — I2510 Atherosclerotic heart disease of native coronary artery without angina pectoris: Secondary | ICD-10-CM | POA: Diagnosis not present

## 2022-01-24 DIAGNOSIS — I4891 Unspecified atrial fibrillation: Secondary | ICD-10-CM | POA: Diagnosis not present

## 2022-01-24 DIAGNOSIS — E039 Hypothyroidism, unspecified: Secondary | ICD-10-CM | POA: Diagnosis not present

## 2022-01-24 DIAGNOSIS — E785 Hyperlipidemia, unspecified: Secondary | ICD-10-CM | POA: Diagnosis not present

## 2022-01-24 DIAGNOSIS — M6281 Muscle weakness (generalized): Secondary | ICD-10-CM | POA: Diagnosis not present

## 2022-01-24 DIAGNOSIS — G450 Vertebro-basilar artery syndrome: Secondary | ICD-10-CM | POA: Diagnosis not present

## 2022-01-24 DIAGNOSIS — K219 Gastro-esophageal reflux disease without esophagitis: Secondary | ICD-10-CM | POA: Diagnosis not present

## 2022-01-24 DIAGNOSIS — I495 Sick sinus syndrome: Secondary | ICD-10-CM | POA: Diagnosis not present

## 2022-01-24 DIAGNOSIS — I10 Essential (primary) hypertension: Secondary | ICD-10-CM | POA: Diagnosis not present

## 2022-01-28 DIAGNOSIS — M6281 Muscle weakness (generalized): Secondary | ICD-10-CM | POA: Diagnosis not present

## 2022-01-29 DIAGNOSIS — D508 Other iron deficiency anemias: Secondary | ICD-10-CM | POA: Diagnosis not present

## 2022-01-29 DIAGNOSIS — E039 Hypothyroidism, unspecified: Secondary | ICD-10-CM | POA: Diagnosis not present

## 2022-01-29 DIAGNOSIS — E08311 Diabetes mellitus due to underlying condition with unspecified diabetic retinopathy with macular edema: Secondary | ICD-10-CM | POA: Diagnosis not present

## 2022-01-29 DIAGNOSIS — M6281 Muscle weakness (generalized): Secondary | ICD-10-CM | POA: Diagnosis not present

## 2022-01-29 DIAGNOSIS — E559 Vitamin D deficiency, unspecified: Secondary | ICD-10-CM | POA: Diagnosis not present

## 2022-01-30 DIAGNOSIS — M6281 Muscle weakness (generalized): Secondary | ICD-10-CM | POA: Diagnosis not present

## 2022-01-31 DIAGNOSIS — M6281 Muscle weakness (generalized): Secondary | ICD-10-CM | POA: Diagnosis not present

## 2022-02-01 DIAGNOSIS — M6281 Muscle weakness (generalized): Secondary | ICD-10-CM | POA: Diagnosis not present

## 2022-02-04 DIAGNOSIS — M6281 Muscle weakness (generalized): Secondary | ICD-10-CM | POA: Diagnosis not present

## 2022-02-05 DIAGNOSIS — M6281 Muscle weakness (generalized): Secondary | ICD-10-CM | POA: Diagnosis not present

## 2022-02-06 DIAGNOSIS — M6281 Muscle weakness (generalized): Secondary | ICD-10-CM | POA: Diagnosis not present

## 2022-02-08 DIAGNOSIS — M6281 Muscle weakness (generalized): Secondary | ICD-10-CM | POA: Diagnosis not present

## 2022-02-11 DIAGNOSIS — M6281 Muscle weakness (generalized): Secondary | ICD-10-CM | POA: Diagnosis not present

## 2022-02-13 DIAGNOSIS — M6281 Muscle weakness (generalized): Secondary | ICD-10-CM | POA: Diagnosis not present

## 2022-02-14 DIAGNOSIS — M6281 Muscle weakness (generalized): Secondary | ICD-10-CM | POA: Diagnosis not present

## 2022-02-15 DIAGNOSIS — M6281 Muscle weakness (generalized): Secondary | ICD-10-CM | POA: Diagnosis not present

## 2022-02-18 DIAGNOSIS — M6281 Muscle weakness (generalized): Secondary | ICD-10-CM | POA: Diagnosis not present

## 2022-02-19 ENCOUNTER — Other Ambulatory Visit: Payer: Self-pay | Admitting: Internal Medicine

## 2022-02-19 DIAGNOSIS — Z1231 Encounter for screening mammogram for malignant neoplasm of breast: Secondary | ICD-10-CM

## 2022-02-19 DIAGNOSIS — M6281 Muscle weakness (generalized): Secondary | ICD-10-CM | POA: Diagnosis not present

## 2022-02-20 DIAGNOSIS — M6281 Muscle weakness (generalized): Secondary | ICD-10-CM | POA: Diagnosis not present

## 2022-02-21 DIAGNOSIS — M6281 Muscle weakness (generalized): Secondary | ICD-10-CM | POA: Diagnosis not present

## 2022-02-22 DIAGNOSIS — M6281 Muscle weakness (generalized): Secondary | ICD-10-CM | POA: Diagnosis not present

## 2022-02-25 DIAGNOSIS — M6281 Muscle weakness (generalized): Secondary | ICD-10-CM | POA: Diagnosis not present

## 2022-02-26 DIAGNOSIS — M6281 Muscle weakness (generalized): Secondary | ICD-10-CM | POA: Diagnosis not present

## 2022-02-27 DIAGNOSIS — M6281 Muscle weakness (generalized): Secondary | ICD-10-CM | POA: Diagnosis not present

## 2022-02-28 DIAGNOSIS — M6281 Muscle weakness (generalized): Secondary | ICD-10-CM | POA: Diagnosis not present

## 2022-03-06 ENCOUNTER — Ambulatory Visit (INDEPENDENT_AMBULATORY_CARE_PROVIDER_SITE_OTHER): Payer: Medicare Other

## 2022-03-06 DIAGNOSIS — I495 Sick sinus syndrome: Secondary | ICD-10-CM

## 2022-03-06 DIAGNOSIS — M6281 Muscle weakness (generalized): Secondary | ICD-10-CM | POA: Diagnosis not present

## 2022-03-07 DIAGNOSIS — M6281 Muscle weakness (generalized): Secondary | ICD-10-CM | POA: Diagnosis not present

## 2022-03-11 DIAGNOSIS — M6281 Muscle weakness (generalized): Secondary | ICD-10-CM | POA: Diagnosis not present

## 2022-03-12 DIAGNOSIS — M6281 Muscle weakness (generalized): Secondary | ICD-10-CM | POA: Diagnosis not present

## 2022-03-12 LAB — CUP PACEART REMOTE DEVICE CHECK
Battery Impedance: 1622 Ohm
Battery Remaining Longevity: 49 mo
Battery Voltage: 2.77 V
Brady Statistic AP VP Percent: 1 %
Brady Statistic AP VS Percent: 6 %
Brady Statistic AS VP Percent: 0 %
Brady Statistic AS VS Percent: 93 %
Date Time Interrogation Session: 20230606105005
Implantable Lead Implant Date: 20070710
Implantable Lead Implant Date: 20070710
Implantable Lead Location: 753859
Implantable Lead Location: 753860
Implantable Lead Model: 4469
Implantable Lead Model: 4470
Implantable Lead Serial Number: 483166
Implantable Lead Serial Number: 541525
Implantable Pulse Generator Implant Date: 20130410
Lead Channel Impedance Value: 654 Ohm
Lead Channel Impedance Value: 728 Ohm
Lead Channel Pacing Threshold Amplitude: 0.75 V
Lead Channel Pacing Threshold Amplitude: 0.75 V
Lead Channel Pacing Threshold Pulse Width: 0.4 ms
Lead Channel Pacing Threshold Pulse Width: 0.4 ms
Lead Channel Setting Pacing Amplitude: 2 V
Lead Channel Setting Pacing Amplitude: 2.5 V
Lead Channel Setting Pacing Pulse Width: 0.4 ms
Lead Channel Setting Sensing Sensitivity: 5.6 mV

## 2022-03-13 DIAGNOSIS — M6281 Muscle weakness (generalized): Secondary | ICD-10-CM | POA: Diagnosis not present

## 2022-03-14 ENCOUNTER — Other Ambulatory Visit: Payer: Self-pay | Admitting: Orthopaedic Surgery

## 2022-03-14 DIAGNOSIS — Z01818 Encounter for other preprocedural examination: Secondary | ICD-10-CM

## 2022-03-14 DIAGNOSIS — M6281 Muscle weakness (generalized): Secondary | ICD-10-CM | POA: Diagnosis not present

## 2022-03-15 DIAGNOSIS — M6281 Muscle weakness (generalized): Secondary | ICD-10-CM | POA: Diagnosis not present

## 2022-03-16 DIAGNOSIS — R0982 Postnasal drip: Secondary | ICD-10-CM | POA: Diagnosis not present

## 2022-03-16 DIAGNOSIS — E559 Vitamin D deficiency, unspecified: Secondary | ICD-10-CM | POA: Diagnosis not present

## 2022-03-16 DIAGNOSIS — E119 Type 2 diabetes mellitus without complications: Secondary | ICD-10-CM | POA: Diagnosis not present

## 2022-03-16 DIAGNOSIS — J3489 Other specified disorders of nose and nasal sinuses: Secondary | ICD-10-CM | POA: Diagnosis not present

## 2022-03-16 DIAGNOSIS — J029 Acute pharyngitis, unspecified: Secondary | ICD-10-CM | POA: Diagnosis not present

## 2022-03-16 DIAGNOSIS — E785 Hyperlipidemia, unspecified: Secondary | ICD-10-CM | POA: Diagnosis not present

## 2022-03-16 DIAGNOSIS — F32A Depression, unspecified: Secondary | ICD-10-CM | POA: Diagnosis not present

## 2022-03-18 ENCOUNTER — Other Ambulatory Visit: Payer: Medicare Other

## 2022-03-19 DIAGNOSIS — M6281 Muscle weakness (generalized): Secondary | ICD-10-CM | POA: Diagnosis not present

## 2022-03-19 NOTE — Progress Notes (Signed)
Remote pacemaker transmission.   

## 2022-03-20 DIAGNOSIS — M6281 Muscle weakness (generalized): Secondary | ICD-10-CM | POA: Diagnosis not present

## 2022-03-22 DIAGNOSIS — M6281 Muscle weakness (generalized): Secondary | ICD-10-CM | POA: Diagnosis not present

## 2022-03-25 ENCOUNTER — Ambulatory Visit
Admission: RE | Admit: 2022-03-25 | Discharge: 2022-03-25 | Disposition: A | Discharge status: Home / Self Care | Payer: Medicare Other | Source: Ambulatory Visit | Attending: Orthopaedic Surgery | Admitting: Orthopaedic Surgery

## 2022-03-25 DIAGNOSIS — Z01818 Encounter for other preprocedural examination: Secondary | ICD-10-CM | POA: Diagnosis not present

## 2022-03-25 DIAGNOSIS — S43004A Unspecified dislocation of right shoulder joint, initial encounter: Secondary | ICD-10-CM | POA: Diagnosis not present

## 2022-03-25 DIAGNOSIS — M25411 Effusion, right shoulder: Secondary | ICD-10-CM | POA: Diagnosis not present

## 2022-03-25 DIAGNOSIS — M19011 Primary osteoarthritis, right shoulder: Secondary | ICD-10-CM | POA: Diagnosis not present

## 2022-03-27 ENCOUNTER — Encounter (HOSPITAL_BASED_OUTPATIENT_CLINIC_OR_DEPARTMENT_OTHER): Payer: Self-pay

## 2022-03-27 ENCOUNTER — Other Ambulatory Visit: Payer: Self-pay

## 2022-03-27 ENCOUNTER — Emergency Department (HOSPITAL_BASED_OUTPATIENT_CLINIC_OR_DEPARTMENT_OTHER): Payer: Medicare Other | Admitting: Radiology

## 2022-03-27 ENCOUNTER — Emergency Department (HOSPITAL_BASED_OUTPATIENT_CLINIC_OR_DEPARTMENT_OTHER)
Admission: EM | Admit: 2022-03-27 | Discharge: 2022-03-27 | Disposition: A | Discharge status: Home / Self Care | Payer: Medicare Other | Attending: Emergency Medicine | Admitting: Emergency Medicine

## 2022-03-27 DIAGNOSIS — Z743 Need for continuous supervision: Secondary | ICD-10-CM | POA: Diagnosis not present

## 2022-03-27 DIAGNOSIS — J209 Acute bronchitis, unspecified: Secondary | ICD-10-CM | POA: Diagnosis not present

## 2022-03-27 DIAGNOSIS — I129 Hypertensive chronic kidney disease with stage 1 through stage 4 chronic kidney disease, or unspecified chronic kidney disease: Secondary | ICD-10-CM | POA: Insufficient documentation

## 2022-03-27 DIAGNOSIS — Z20822 Contact with and (suspected) exposure to covid-19: Secondary | ICD-10-CM | POA: Insufficient documentation

## 2022-03-27 DIAGNOSIS — J40 Bronchitis, not specified as acute or chronic: Secondary | ICD-10-CM | POA: Diagnosis not present

## 2022-03-27 DIAGNOSIS — R059 Cough, unspecified: Secondary | ICD-10-CM | POA: Diagnosis not present

## 2022-03-27 DIAGNOSIS — J069 Acute upper respiratory infection, unspecified: Secondary | ICD-10-CM | POA: Insufficient documentation

## 2022-03-27 DIAGNOSIS — N189 Chronic kidney disease, unspecified: Secondary | ICD-10-CM | POA: Diagnosis not present

## 2022-03-27 DIAGNOSIS — R197 Diarrhea, unspecified: Secondary | ICD-10-CM | POA: Diagnosis not present

## 2022-03-27 DIAGNOSIS — B9789 Other viral agents as the cause of diseases classified elsewhere: Secondary | ICD-10-CM | POA: Diagnosis not present

## 2022-03-27 DIAGNOSIS — Z79899 Other long term (current) drug therapy: Secondary | ICD-10-CM | POA: Diagnosis not present

## 2022-03-27 DIAGNOSIS — R0602 Shortness of breath: Secondary | ICD-10-CM | POA: Diagnosis not present

## 2022-03-27 DIAGNOSIS — R1111 Vomiting without nausea: Secondary | ICD-10-CM | POA: Diagnosis not present

## 2022-03-27 DIAGNOSIS — Z950 Presence of cardiac pacemaker: Secondary | ICD-10-CM | POA: Insufficient documentation

## 2022-03-27 DIAGNOSIS — I2510 Atherosclerotic heart disease of native coronary artery without angina pectoris: Secondary | ICD-10-CM | POA: Diagnosis not present

## 2022-03-27 DIAGNOSIS — Z7982 Long term (current) use of aspirin: Secondary | ICD-10-CM | POA: Insufficient documentation

## 2022-03-27 LAB — RESP PANEL BY RT-PCR (RSV, FLU A&B, COVID)  RVPGX2
Influenza A by PCR: NEGATIVE
Influenza B by PCR: NEGATIVE
Resp Syncytial Virus by PCR: NEGATIVE
SARS Coronavirus 2 by RT PCR: NEGATIVE

## 2022-03-27 NOTE — ED Provider Notes (Signed)
[facility] EMERGENC[facility] DEPT Provider Note   CSN: [id number] Arrival date & time: 03/27/22  1454     History  Chief Complaint  Patient presents with   URI    Heidi [name] is a 81 y.o. female.  Patient from assisted living.  Patient with a complaint of upper respiratory type infection flulike illness for a little over a week.  Cough been productive.  10 yellow phlegm.  No nausea no vomiting no diarrhea no fevers.  Where she is staying there is been an outbreak of RSV.  And she is concerned that that is what she has.  Early on patient had a sore throat.  That has resolved.  Past medical history significant for seasonal allergies hypertension hyperlipidemia intermittent atrial fibrillation nonobstructive coronary artery disease tachybradycardia syndrome pacemaker placed in 2007 chronic kidney disease.       Home Medications Prior to Admission medications   Medication Sig Start Date End Date Taking? Authorizing Provider  acetaminophen (TYLENOL) 500 MG tablet Take 1,000 mg by mouth daily as needed for headache (pain).    [provider]  aspirin EC 81 MG tablet Take 81 mg by mouth at bedtime. 12/24/11   Deboraha Sprang, MD  benazepril (LOTENSIN) 10 MG tablet Take 10 mg by mouth daily with breakfast.    [provider]  cetirizine (ZYRTEC) 10 MG tablet Take 10 mg by mouth daily with breakfast.    [provider]  Cholecalciferol (VITAMIN D-3) 125 MCG (5000 UT) TABS Take 5,000 Units by mouth daily with lunch.    [provider]  cholestyramine (QUESTRAN) 4 g packet Take 4 g by mouth 2 (two) times daily as needed (diarrhea). 08/13/18   [provider]  fenofibrate 160 MG tablet Take 160 mg by mouth at bedtime. Take with food 12/30/18   [provider]  ferrous sulfate 325 (65 FE) MG tablet Take 325 mg by mouth daily with lunch.    [provider]  fluticasone (FLONASE) 50 MCG/ACT nasal spray Place 2 sprays into the  nose daily as needed for allergies.     [provider]  furosemide (LASIX) 20 MG tablet TAKE 1 TABLET BY MOUTH 3 TIMES A WEEK. 05/07/21   Jerline Pain, MD  gabapentin (NEURONTIN) 300 MG capsule Take 300 mg by mouth 2 (two) times daily.    [provider]  galantamine (RAZADYNE ER) 8 MG 24 hr capsule Take 8 mg by mouth daily with breakfast. 08/13/18   [provider]  hydrOXYzine (ATARAX/VISTARIL) 25 MG tablet Take 50 mg by mouth at bedtime.    [provider]  levothyroxine (SYNTHROID, LEVOTHROID) 75 MCG tablet Take 75 mcg by mouth daily before breakfast.    [provider]  metFORMIN (GLUCOPHAGE-XR) 500 MG 24 hr tablet Take 500 mg by mouth in the morning and at bedtime. 10/05/20   [provider]  metoprolol succinate (TOPROL-XL) 100 MG 24 hr tablet TAKE 1 TABLET(100 MG) BY MOUTH DAILY 04/25/21   Jerline Pain, MD  Multiple Vitamin (MULTIVITAMIN WITH MINERALS) TABS tablet Take 1 tablet by mouth daily with breakfast.    [provider]  nortriptyline (PAMELOR) 25 MG capsule Take 25 mg by mouth at bedtime. Take with a 75 mg capsule for a total dose of 100 mg nightly 12/05/20   [provider]  nortriptyline (PAMELOR) 75 MG capsule Take 75 mg by mouth at bedtime. Take with a 25 mg capsule for a total dose of 100  mg nightly    [provider]  pantoprazole (PROTONIX) 40 MG tablet Take 40 mg by mouth See admin instructions. Take one tablet (40 mg) by mouth twice daily - lunch and bedtime 06/01/18   [provider]  PRESCRIPTION MEDICATION Inhale into the lungs at bedtime. CPAP    [provider]  spironolactone (ALDACTONE) 25 MG tablet Take 1 tablet (25 mg total) by mouth daily. 10/03/17   Deboraha Sprang, MD  vitamin C (ASCORBIC ACID) 500 MG tablet Take 500 mg by mouth daily with lunch.    [provider]      Allergies    Crestor [rosuvastatin calcium], Effexor [venlafaxine hydrochloride],  Adhesive [tape], Clopidogrel, Hibiclens [chlorhexidine gluconate], Lipitor [atorvastatin calcium], Nardil, Penicillins, Plavix [clopidogrel bisulfate], Simvastatin, Sulfonamide derivatives, and Zestril [lisinopril]    Review of Systems   Review of Systems  Constitutional:  Negative for chills and fever.  HENT:  Positive for congestion and sore throat. Negative for ear pain.   Eyes:  Negative for pain and visual disturbance.  Respiratory:  Positive for cough. Negative for shortness of breath.   Cardiovascular:  Negative for chest pain and palpitations.  Gastrointestinal:  Negative for abdominal pain, diarrhea, nausea and vomiting.  Genitourinary:  Negative for dysuria and hematuria.  Musculoskeletal:  Negative for arthralgias and back pain.  Skin:  Negative for color change and rash.  Neurological:  Negative for seizures and syncope.  All other systems reviewed and are negative.   Physical Exam Updated Vital Signs BP 113/68 (BP Location: Left Arm)   Pulse 77   Temp 98.5 F (36.9 C) (Oral)   Resp 16   Ht 1.556 m (5' 1.25")   Wt 65.3 kg   SpO2 99%   BMI 26.99 kg/m  Physical Exam Vitals and nursing note reviewed.  Constitutional:      General: She is not in acute distress.    Appearance: Normal appearance. She is well-developed.  HENT:     Head: Normocephalic and atraumatic.     Mouth/Throat:     Mouth: Mucous membranes are moist.     Pharynx: Oropharynx is clear. No oropharyngeal exudate or posterior oropharyngeal erythema.  Eyes:     Extraocular Movements: Extraocular movements intact.     Conjunctiva/sclera: Conjunctivae normal.     Pupils: Pupils are equal, round, and reactive to light.  Cardiovascular:     Rate and Rhythm: Normal rate and regular rhythm.     Heart sounds: No murmur heard. Pulmonary:     Effort: Pulmonary effort is normal. No respiratory distress.     Breath sounds: Normal breath sounds. No stridor. No wheezing, rhonchi or rales.  Chest:     Chest  wall: No tenderness.  Abdominal:     Palpations: Abdomen is soft.     Tenderness: There is no abdominal tenderness.  Musculoskeletal:        General: No swelling.     Cervical back: Normal range of motion and neck supple. No rigidity.  Skin:    General: Skin is warm and dry.     Capillary Refill: Capillary refill takes less than 2 seconds.  Neurological:     General: No focal deficit present.     Mental Status: She is alert and oriented to person, place, and time.  Psychiatric:        Mood and Affect: Mood normal.     ED Results / Procedures / Treatments   Labs (all labs ordered are listed, but only abnormal  results are displayed) Labs Reviewed  RESP PANEL BY RT-PCR (RSV, FLU A&B, COVID)  [id number]    EKG None  Radiology DG Chest 2 View  Result Date: 03/27/2022 CLINICAL DATA:  Cough and short of breath EXAM: CHEST - 2 VIEW COMPARISON:  08/07/2021 FINDINGS: Right subclavian dual lead pacemaker unchanged. Heart size and vascularity normal. Lungs clear without infiltrate effusion Thoracolumbar scoliosis. IMPRESSION: No active cardiopulmonary disease. Electronically Signed   By: Franchot Gallo M.D.   On: 03/27/2022 15:34    Procedures Procedures    Medications Ordered in ED Medications - No data to display  ED Course/ Medical Decision Making/ A&P                           Medical Decision Making Amount and/or Complexity of Data Reviewed Radiology: ordered.   Patient nontoxic no acute distress.  Patient main concern was RSV.  Chest x-ray negative for pneumonia.  RSV COVID influenza testing negative.  Feel that patient has upper respiratory infection with bronchitis component.  No hypoxia.  Oxygen sats are in the upper 100s.  No fever.  No vital sign abnormalities.  Patient stable for discharge home will have her add on Mucinex DM to her symptomatic treatment regiment.  And she will return for any new or worse symptoms.   Final Clinical Impression(s) / ED  Diagnoses Final diagnoses:  Viral upper respiratory tract infection  Bronchitis    Rx / DC Orders ED Discharge Orders     None         Fredia Sorrow, MD 03/27/22 Einar Crow

## 2022-03-27 NOTE — Discharge Instructions (Signed)
We recommending adding on Mucinex DM 12-hour tablet to help with the cough and loosen the phlegm.  Today's work-up chest x-ray negative for pneumonia.  No evidence of COVID or influenza or RSV.  Follow-up with your doctor.  Return for any new or worse symptoms

## 2022-03-27 NOTE — ED Notes (Signed)
Spoke with lab x2 in order to get RSV swab as it will not let me order this on this pt due to it being restricted to pediatric patients.  Lab will adjust on their end.

## 2022-03-27 NOTE — ED Triage Notes (Signed)
She c/o songested cough for ~ just over a week. She states her cough is minimally productive of small amounts of yellow phlegm. She is alert and in no distress.

## 2022-03-27 NOTE — ED Notes (Signed)
Discussed pt with [facility] Director Mickel Baas [phone]. Informed of pts discharge from our facility who left with daughter and there acceptance back to their facility.

## 2022-03-27 NOTE — ED Notes (Signed)
Call to [facility] to give report to nursing staff.  Left message with person who will contact nursing supervisor. Await call back for report

## 2022-03-28 DIAGNOSIS — R0982 Postnasal drip: Secondary | ICD-10-CM | POA: Diagnosis not present

## 2022-03-28 DIAGNOSIS — J22 Unspecified acute lower respiratory infection: Secondary | ICD-10-CM | POA: Diagnosis not present

## 2022-03-28 DIAGNOSIS — J3489 Other specified disorders of nose and nasal sinuses: Secondary | ICD-10-CM | POA: Diagnosis not present

## 2022-03-28 DIAGNOSIS — B9789 Other viral agents as the cause of diseases classified elsewhere: Secondary | ICD-10-CM | POA: Diagnosis not present

## 2022-04-15 NOTE — Progress Notes (Signed)
Sent message, via epic in basket, requesting order in epic from surgeon     04/15/22 1343  Preop Orders  Has preop orders? No  Name of staff/physician contacted for orders(Indicate phone or IB message) C. McBane, PA-C.

## 2022-04-15 NOTE — Care Plan (Signed)
Ortho Bundle Case Management Note  Patient Details  Name: Heidi [name] MRN: [id number] Date of Birth: [birth year]    Spoke with patient prior to surgery. Will discharge to home with family to assist. No DME needed. OPPT set up with [facility] and MD in agreement with plan. Choice offered                  DME Arranged:    DME Agency:     HH Arranged:    HH Agency:     Additional Comments: Please contact me with any questions of if this plan should need to change.  Ladell Heads,  [place] Specialist  [phone] 04/15/2022, 11:43 AM

## 2022-04-17 NOTE — Progress Notes (Addendum)
Anesthesia Review:  PCP: Was DR Lysle Rubens now a resident at [facility]  here in [place] in [place] it is [facility]  Cardiologist :DR Marlou Porch  Device orders requested on 04/22/22.  EP- DR Jolyn Nap  11/30/21- LOV Renee Ursuy,PAC  Chest x-ray : 03/27/22  EKG : 11/30/21  and 04/22/22  Device Check 03/12/22  Echo : 12/11/21  Stress test: Cardiac Cath :  Activity level: cannot do a flgiht of stairs without difficutly  Sleep Study/ CPAP : has cpap has not used in a while.  To bring mask and tubing day of surgery   Fasting Blood Sugar :      / Checks Blood Sugar -- times a day:   Blood Thinner/ Instructions /Last Dose: ASA / Instructions/ Last Dose :   81 mg Aspirin  03/27/22- URI infection visit  PT's daughter is Social research officer, government for [employer] was  a nurse  Dtr needs to be back with pt at prep of surgery.  Has memory issues.   PT at [facility] here is [place] in [place] .  Has been there since 01/2022.  Daughter was given 3 copies of preop instructions at time of preop to take back and given to staff at [facility].  PT has her own copy.   PT allergic to hibiclens.   DM- type 2 - does not check glucose at [facility]  Hgba1c- 04/22/22- 6.5  [employer] Rep paged on 04/23/2022 at 1625pm.  Theyare to call back.  AT 1725pm rep never called back Will attempt again on 04/24/2022.  AT time of preop appt on 04/22/22 pt was alert and oriented x 3.  Daughter with pt at preop appt. PT could tell preop nurse procedure she was having and MD who was doing the surgery.  PT could also answer questions in regards to med hx and when meds were taken.   Devic rep returned call on 04/24/22 and stated pt only paces 1% of time.  Device rep stated he was goin to reach out to [facility] [place] and get them to do an updated preiop deviice order for surgery.  Dev rep aware on 04/24/22 of what current device orders state.  Also aware of date, time of surgery, procedure and arrival time and surgeon.

## 2022-04-17 NOTE — Progress Notes (Signed)
DUE TO COVID-19 ONLY ONE VISITOR IS ALLOWED TO COME WITH YOU AND STAY IN THE WAITING ROOM ONLY DURING PRE OP AND PROCEDURE DAY OF SURGERY.  2 VISITOR  MAY VISIT WITH YOU AFTER SURGERY IN YOUR PRIVATE ROOM DURING VISITING HOURS ONLY! YOU MAY HAVE ONE PERSON SPEND THE NITE WITH YOU IN YOUR ROOM AFTER SURGERY.       Your procedure is scheduled on:      05/01/22   Report to [facility] Main  Entrance   Report to admitting at       0515         AM DO NOT [place], PICTURE ID OR WALLET DAY OF SURGERY.      Call this number if you have problems the morning of surgery [phone]    REMEMBER: NO  SOLID FOODS , CANDY, GUM OR MINTS AFTER [place] .       Marland Kitchen CLEAR LIQUIDS UNTIL    0430am             DAY OF SURGERY.      PLEASE FINISH g2 lower sugar  DRINK PER SURGEON ORDER  WHICH NEEDS TO BE COMPLETED AT   0430am        MORNING OF SURGERY.       CLEAR LIQUID DIET   Foods Allowed      WATER BLACK COFFEE ( SUGAR OK, NO MILK, CREAM OR CREAMER) REGULAR AND DECAF  TEA ( SUGAR OK NO MILK, CREAM, OR CREAMER) REGULAR AND DECAF  PLAIN JELLO ( NO RED)  FRUIT ICES ( NO RED, NO FRUIT PULP)  POPSICLES ( NO RED)  JUICE- APPLE, WHITE GRAPE AND WHITE CRANBERRY  SPORT DRINK LIKE GATORADE ( NO RED)  CLEAR BROTH ( VEGETABLE , CHICKEN OR BEEF)                                                                     BRUSH YOUR TEETH MORNING OF SURGERY AND RINSE YOUR MOUTH OUT, NO CHEWING GUM CANDY OR MINTS.     Take these medicines the morning of surgery with A SIP OF WATER:  gabapentin, synthroid, toprol, protonix    DO NOT TAKE ANY DIABETIC MEDICATIONS DAY OF YOUR SURGERY                               You may not have any metal on your body including hair pins and              piercings  Do not wear jewelry, make-up, lotions, powders or perfumes, deodorant             Do not wear nail polish on your fingernails.              IF YOU ARE A FEMALE AND WANT TO SHAVE  UNDER ARMS OR LEGS PRIOR TO SURGERY YOU MUST DO SO AT LEAST 48 HOURS PRIOR TO SURGERY.              Men may shave face and neck.   Do not bring valuables to the hospital. [facility] IS NOT  RESPONSIBLE   FOR VALUABLES.  Contacts, dentures or bridgework may not be worn into surgery.  Leave suitcase in the car. After surgery it may be brought to your room.     Patients discharged the day of surgery will not be allowed to drive home. IF YOU ARE HAVING SURGERY AND GOING HOME THE SAME DAY, YOU MUST HAVE AN ADULT TO DRIVE YOU HOME AND BE WITH YOU FOR 24 HOURS. YOU MAY GO HOME BY TAXI OR UBER OR ORTHERWISE, BUT AN ADULT MUST ACCOMPANY YOU HOME AND STAY WITH YOU FOR 24 HOURS.                Please read over the following fact sheets you were given: _____________________________________________________________________  [facility] - Preparing for Surgery Before surgery, you can play an important role.  Because skin is not sterile, your skin needs to be as free of germs as possible.  You can reduce the number of germs on your skin by washing with CHG (chlorahexidine gluconate) soap before surgery.  CHG is an antiseptic cleaner which kills germs and bonds with the skin to continue killing germs even after washing. Please DO NOT use if you have an allergy to CHG or antibacterial soaps.  If your skin becomes reddened/irritated stop using the CHG and inform your nurse when you arrive at Short Stay. Do not shave (including legs and underarms) for at least 48 hours prior to the first CHG shower.  You may shave your face/neck. Please follow these instructions carefully:  1.  Shower with CHG Soap the night before surgery and the  morning of Surgery.  2.  If you choose to wash your hair, wash your hair first as usual with your  normal  shampoo.  3.  After you shampoo, rinse your hair and body thoroughly to remove the  shampoo.                           4.  Use CHG as you would any other liquid soap.   You can apply chg directly  to the skin and wash                       Gently with a scrungie or clean washcloth.  5.  Apply the CHG Soap to your body ONLY FROM THE NECK DOWN.   Do not use on face/ open                           Wound or open sores. Avoid contact with eyes, ears mouth and genitals (private parts).                       Wash face,  Genitals (private parts) with your normal soap.             6.  Wash thoroughly, paying special attention to the area where your surgery  will be performed.  7.  Thoroughly rinse your body with warm water from the neck down.  8.  DO NOT shower/wash with your normal soap after using and rinsing off  the CHG Soap.                9.  Pat yourself dry with a clean towel.            10.  Wear clean pajamas.  11.  Place clean sheets on your bed the night of your first shower and do not  sleep with pets. Day of Surgery : Do not apply any lotions/deodorants the morning of surgery.  Please wear clean clothes to the hospital/surgery center.  FAILURE TO FOLLOW THESE INSTRUCTIONS MAY RESULT IN THE CANCELLATION OF YOUR SURGERY PATIENT SIGNATURE_________________________________  NURSE SIGNATURE__________________________________  ________________________________________________________________________

## 2022-04-17 NOTE — Progress Notes (Signed)
[place]- Preparing for Total Shoulder Arthroplasty  °  °Before surgery, you can play an important role. Because skin is not sterile, your skin needs to be as free of germs as possible. You can reduce the number of germs on your skin by using the following products. °Benzoyl Peroxide Gel °Reduces the number of germs present on the skin °Applied twice a day to shoulder area starting two days before surgery   ° °================================================================== ° °Please follow these instructions carefully: ° °BENZOYL PEROXIDE 5% GEL ° °Please do not use if you have an allergy to benzoyl peroxide.   If your skin becomes reddened/irritated stop using the benzoyl peroxide. ° °Starting two days before surgery, apply as follows: °Apply benzoyl peroxide in the morning and at night. Apply after taking a shower. If you are not taking a shower clean entire shoulder front, back, and side along with the armpit with a clean wet washcloth. ° °Place a quarter-sized dollop on your shoulder and rub in thoroughly, making sure to cover the front, back, and side of your shoulder, along with the armpit.  ° °2 days before ____ AM   ____ PM              1 day before ____ AM   ____ PM °                        °Do this twice a day for two days.  (Last application is the night before surgery, AFTER using the CHG soap as described below). ° °Do NOT apply benzoyl peroxide gel on the day of surgery.  °

## 2022-04-19 ENCOUNTER — Ambulatory Visit
Admission: RE | Admit: 2022-04-19 | Discharge: 2022-04-19 | Disposition: A | Discharge status: Home / Self Care | Payer: Medicare Other | Source: Ambulatory Visit | Attending: Internal Medicine | Admitting: Internal Medicine

## 2022-04-19 DIAGNOSIS — Z1231 Encounter for screening mammogram for malignant neoplasm of breast: Secondary | ICD-10-CM | POA: Diagnosis not present

## 2022-04-22 ENCOUNTER — Encounter (HOSPITAL_COMMUNITY)
Admission: RE | Admit: 2022-04-22 | Discharge: 2022-04-22 | Disposition: A | Discharge status: Home / Self Care | Payer: Medicare Other | Source: Ambulatory Visit | Attending: Orthopaedic Surgery | Admitting: Orthopaedic Surgery

## 2022-04-22 ENCOUNTER — Other Ambulatory Visit: Payer: Self-pay

## 2022-04-22 ENCOUNTER — Encounter: Payer: Self-pay | Admitting: Internal Medicine

## 2022-04-22 ENCOUNTER — Encounter (HOSPITAL_COMMUNITY): Payer: Self-pay

## 2022-04-22 VITALS — BP 146/65 | HR 80 | Temp 98.6°F | Resp 16 | Ht 61.25 in | Wt 140.0 lb

## 2022-04-22 DIAGNOSIS — Z01818 Encounter for other preprocedural examination: Secondary | ICD-10-CM | POA: Diagnosis not present

## 2022-04-22 HISTORY — DX: Presence of cardiac pacemaker: Z95.0

## 2022-04-22 HISTORY — DX: Cardiac murmur, unspecified: R01.1

## 2022-04-22 LAB — CBC
HCT: 39.6 % (ref 36.0–46.0)
Hemoglobin: 12.6 g/dL (ref 12.0–15.0)
MCH: 29 pg (ref 26.0–34.0)
MCHC: 31.8 g/dL (ref 30.0–36.0)
MCV: 91 fL (ref 80.0–100.0)
Platelets: 225 10*3/uL (ref 150–400)
RBC: 4.35 MIL/uL (ref 3.87–5.11)
RDW: 13.3 % (ref 11.5–15.5)
WBC: 6.5 10*3/uL (ref 4.0–10.5)
nRBC: 0 % (ref 0.0–0.2)

## 2022-04-22 LAB — BASIC METABOLIC PANEL
Anion gap: 7 (ref 5–15)
BUN: 19 mg/dL (ref 8–23)
CO2: 23 mmol/L (ref 22–32)
Calcium: 10.2 mg/dL (ref 8.9–10.3)
Chloride: 106 mmol/L (ref 98–111)
Creatinine, Ser: 1.03 mg/dL — ABNORMAL HIGH (ref 0.44–1.00)
GFR, Estimated: 55 mL/min — ABNORMAL LOW (ref >60)
Glucose, Bld: 159 mg/dL — ABNORMAL HIGH (ref 70–99)
Potassium: 3.9 mmol/L (ref 3.5–5.1)
Sodium: 136 mmol/L (ref 135–145)

## 2022-04-22 LAB — HEMOGLOBIN A1C
Hgb A1c MFr Bld: 6.5 % — ABNORMAL HIGH (ref 4.8–5.6)
Mean Plasma Glucose: 139.85 mg/dL

## 2022-04-22 LAB — SURGICAL PCR SCREEN
MRSA, PCR: NEGATIVE
Staphylococcus aureus: NEGATIVE

## 2022-04-22 LAB — GLUCOSE, CAPILLARY: Glucose-Capillary: 169 mg/dL — ABNORMAL HIGH (ref 70–99)

## 2022-04-22 NOTE — Progress Notes (Signed)
[facility] DEVICE PROGRAMMING  Patient Information: Name:  MARGUITA VENNING  DOB:  1941/05/30  MRN:  [id number]    Planned Procedure:  right reverse shoulder arthroplasty  Surgeon:  Dr Ophelia Charter  Date of Procedure:  05/01/2022  Cautery will be used.  Position during surgery:   Please send documentation back to:  [facility] (Fax # [phone])  Device Information:  Clinic EP Physician:  Virl Axe, MD   Device Type:  Pacemaker Manufacturer and Phone #:  Medtronic: [id number] Pacemaker Dependent?:  No. Date of Last Device Check:  03/12/22 Normal Device Function?:  Yes.    Electrophysiologist's Recommendations:  Have magnet available. Provide continuous ECG monitoring when magnet is used or reprogramming is to be performed.  Procedure will likely interfere with device function.  Device should be programmed:  Asynchronous pacing during procedure and returned to normal programming after procedure  Per Device Clinic Standing Orders, Simone Curia, RN  4:55 PM 04/22/2022

## 2022-04-23 DIAGNOSIS — R296 Repeated falls: Secondary | ICD-10-CM | POA: Diagnosis not present

## 2022-04-23 NOTE — Anesthesia Preprocedure Evaluation (Addendum)
Anesthesia Evaluation  Patient identified by MRN, date of birth, ID band Patient awake    Reviewed: Allergy & Precautions, NPO status , Patient's Chart, lab work & pertinent test results, reviewed documented beta blocker date and time   Airway Mallampati: II  TM Distance: >3 FB Neck ROM: Limited    Dental no notable dental hx. (+) Teeth Intact, Caps, Dental Advisory Given   Pulmonary sleep apnea , former smoker,    Pulmonary exam normal breath sounds clear to auscultation       Cardiovascular hypertension, Pt. on medications and Pt. on home beta blockers + CAD and +CHF  Normal cardiovascular exam+ dysrhythmias Atrial Fibrillation + pacemaker + Valvular Problems/Murmurs  Rhythm:Regular Rate:Normal     Neuro/Psych  Headaches, PSYCHIATRIC DISORDERS Anxiety Depression Dementia Peripheral neuropathy  Neuromuscular disease    GI/Hepatic Neg liver ROS, GERD  Medicated,  Endo/Other  diabetes, Well Controlled, Type 2Hypothyroidism Hyperlipidemia  Renal/GU Renal InsufficiencyRenal disease  negative genitourinary   Musculoskeletal   Abdominal   Peds  Hematology   Anesthesia Other Findings   Reproductive/Obstetrics                           Anesthesia Physical Anesthesia Plan  ASA: 3  Anesthesia Plan: General   Post-op Pain Management: Regional block* and Minimal or no pain anticipated   Induction: Intravenous  PONV Risk Score and Plan: 4 or greater and Treatment may vary due to age or medical condition and Ondansetron  Airway Management Planned: Oral ETT  Additional Equipment: None  Intra-op Plan:   Post-operative Plan: Extubation in OR  Informed Consent: I have reviewed the patients History and Physical, chart, labs and discussed the procedure including the risks, benefits and alternatives for the proposed anesthesia with the patient or authorized representative who has indicated his/her  understanding and acceptance.     Dental advisory given  Plan Discussed with: CRNA and Anesthesiologist  Anesthesia Plan Comments: (See PAT note 04/22/2022)      Anesthesia Quick Evaluation

## 2022-04-23 NOTE — Progress Notes (Signed)
Anesthesia Chart Review   Case: [id number] Date/Time: 05/01/22 0715   Procedure: REVERSE SHOULDER ARTHROPLASTY (Right: Shoulder)   Anesthesia type: Choice   Pre-op diagnosis: RIGHT SHOULDER DISLOCATIONS   Location: [facility] ROOM 06 / [facility] ORS   Surgeons: Hiram Gash, MD       DISCUSSION:81 y.o. former smoker with h/o HTN, GERD, atrial fibrillation, pacemaker in place, CAD, CKD, OSA on CPAP, recurrent right shoulder dislocations scheduled for above procedure 12/02/2021 with Dr. Ophelia Charter.   Device orders in 04/22/2022 progress note.  Per note, device should be programmed, Asynchronous pacing during procedure and returned to normal programming after procedure. Device rep contacted.   Last seen by cardiology 11/30/2021, stable at this visit.  VS: BP (!) 146/65   Pulse 80   Temp 37 C (Oral)   Resp 16   Ht 5' 1.25" (1.556 m)   Wt 63.5 kg   SpO2 96%   BMI 26.24 kg/m   PROVIDERS: Wenda Low, MD is PCP   Candee Furbish, MD is Cardiologist  LABS: Labs reviewed: Acceptable for surgery. (all labs ordered are listed, but only abnormal results are displayed)  Labs Reviewed  HEMOGLOBIN A1C - Abnormal; Notable for the following components:      Result Value   Hgb A1c MFr Bld 6.5 (*)    All other components within normal limits  BASIC METABOLIC PANEL - Abnormal; Notable for the following components:   Glucose, Bld 159 (*)    Creatinine, Ser 1.03 (*)    GFR, Estimated 55 (*)    All other components within normal limits  GLUCOSE, CAPILLARY - Abnormal; Notable for the following components:   Glucose-Capillary 169 (*)    All other components within normal limits  SURGICAL PCR SCREEN  CBC     IMAGES:   EKG: 04/22/2022 Rate 76 bpm  Normal sinus rhythm Left axis deviation Moderate voltage criteria for LVH, may be normal variant ( R in aVL , Cornell product ) Possible Anterior infarct , age undetermined Abnormal ECG When compared with ECG of 25-Dec-2020 15:04, PREVIOUS ECG IS  PRESENT Since last tracing ST changes in I and avL are no longer present.  CV: Echo 12/11/2021 1. Left ventricular ejection fraction, by estimation, is 60 to 65%. The  left ventricle has normal function. The left ventricle has no regional  wall motion abnormalities. Left ventricular diastolic parameters were  normal.   2. Right ventricular systolic function is normal. The right ventricular  size is normal. There is normal pulmonary artery systolic pressure.   3. The mitral valve is normal in structure. Trivial mitral valve  regurgitation.   4. The aortic valve is normal in structure. Aortic valve regurgitation is  not visualized.  Past Medical History:  Diagnosis Date   Anxiety    Atrial fibrillation (HCC)    Intermittent   CAD (coronary artery disease)    non obstructive   CKD (chronic kidney disease)    Diabetes mellitus without complication (HCC)    Dysrhythmia    PAF   GERD (gastroesophageal reflux disease)    Glaucoma    N[place]   Heart murmur    HTN (hypertension)    Hyperlipidemia    Hypothyroid    Major depression    10-17-2010: hospitalized for suicidal, [name] (D/C 10-30-2010)   Migraine headache    OSA (obstructive sleep apnea)    CPAP nightly   Peripheral neuropathy    Presence of permanent cardiac pacemaker  Seasonal allergies    Sleep apnea    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    ABDOMINAL HYSTERECTOMY  1976   APPENDECTOMY     BREAST EXCISIONAL BIOPSY     BREAST SURGERY     fibroids removed, benign   CARDIAC CATHETERIZATION  02-12-2006   Minor irregularities: RCA-mid 40%, LM-normal, LAD-normal. EF 65%.   CHOLECYSTECTOMY     ECTOPIC PREGNANCY SURGERY     treatment x3, last one 11-02-2009 at [facility]   goiter     goiter removed      MASS EXCISION Right 10/18/2015   Procedure: EXCISION OF 6CM MASS ON RIGHT  NECK/BACK;  Surgeon: [name], MD;  Location: [facility];  Service: General;  Laterality: Right;   PACEMAKER GENERATOR CHANGE N/A 01/15/2012   Procedure: PACEMAKER GENERATOR CHANGE;  Surgeon: Deboraha Sprang, MD;  Location: [facility] CATH LAB;  Service: Cardiovascular;  Laterality: N/A;   PACEMAKER INSERTION     Permanent. Medtronic EnRhyth 04/2006, set as DDDR   TUMOR REMOVAL  2000   Fibroid from breast x4-most recent   WRIST FRACTURE SURGERY  11-2007   right, had metal plate inserted    MEDICATIONS:  fexofenadine (ALLEGRA) 60 MG tablet   acetaminophen (TYLENOL) 500 MG tablet   aspirin EC 81 MG tablet   benazepril (LOTENSIN) 10 MG tablet   Cholecalciferol (VITAMIN D-3) 125 MCG (5000 UT) TABS   cholestyramine (QUESTRAN) 4 g packet   fenofibrate 160 MG tablet   ferrous sulfate 325 (65 FE) MG tablet   fluticasone (FLONASE) 50 MCG/ACT nasal spray   furosemide (LASIX) 20 MG tablet   gabapentin (NEURONTIN) 300 MG capsule   galantamine (RAZADYNE ER) 8 MG 24 hr capsule   hydrOXYzine (ATARAX/VISTARIL) 25 MG tablet   levothyroxine (SYNTHROID, LEVOTHROID) 75 MCG tablet   metFORMIN (GLUCOPHAGE-XR) 500 MG 24 hr tablet   metoprolol succinate (TOPROL-XL) 100 MG 24 hr tablet   Multiple Vitamin (MULTIVITAMIN WITH MINERALS) TABS tablet   nortriptyline (PAMELOR) 25 MG capsule   nortriptyline (PAMELOR) 75 MG capsule   pantoprazole (PROTONIX) 40 MG tablet   PRESCRIPTION MEDICATION   spironolactone (ALDACTONE) 25 MG tablet   vitamin C (ASCORBIC ACID) 500 MG tablet   No current facility-administered medications for this encounter.    Konrad Felix Ward, PA-C [facility] Pre-Surgical Testing [phone]

## 2022-04-25 DIAGNOSIS — R296 Repeated falls: Secondary | ICD-10-CM | POA: Diagnosis not present

## 2022-04-29 NOTE — H&P (Signed)
PREOPERATIVE H&P  Chief Complaint: RIGHT SHOULDER DISLOCATIONS  HPI: Heidi [name] is a 81 y.o. female who presents for preoperative history and physical prior to scheduled surgery, Procedure(s): REVERSE SHOULDER ARTHROPLASTY.   Patient has a past medical history significant for atrial fibrillation, CAD, CKD, DM, GERD, HTN, HLD, hypothyroidism, OSA on CPAP, pacemaker.   Patient is an 81 year-old female who dislocated her shoulder during a fall on April 2nd.  She went to the emergency room and had x-rays that did not show any fracture, but did show a dislocated shoulder.  This was appropriately reduced.  She has been in a sling since then.  She reports that she uses a walker constantly, even prior to the fall. She has not been moving it at all since she injured it, as it has been in a sling.  She has not made progress with physical therapy. She has continued pain and limited function of her right shoulder.   Symptoms are rated as moderate to severe, and have been worsening.  This is significantly impairing activities of daily living.    Please see clinic note for further details on this patient's care.    She has elected for surgical management.   Past Medical History:  Diagnosis Date   Anxiety    Atrial fibrillation ([place])    Intermittent   CAD (coronary artery disease)    non obstructive   CKD (chronic kidney disease)    Diabetes mellitus without complication (HCC)    Dysrhythmia    PAF   GERD (gastroesophageal reflux disease)    Glaucoma    [place]   Heart murmur    HTN (hypertension)    Hyperlipidemia    Hypothyroid    Major depression    10-17-2010: hospitalized for suicidal, [name] (D/C 10-30-2010)   Migraine headache    OSA (obstructive sleep apnea)    CPAP nightly   Peripheral neuropathy    Presence of permanent cardiac pacemaker    Seasonal allergies    Sleep apnea    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    ABDOMINAL HYSTERECTOMY  1976   APPENDECTOMY     BREAST EXCISIONAL BIOPSY     BREAST SURGERY     fibroids removed, benign   CARDIAC CATHETERIZATION  02-12-2006   Minor irregularities: RCA-mid 40%, LM-normal, LAD-normal. EF 65%.   CHOLECYSTECTOMY     ECTOPIC PREGNANCY SURGERY     treatment x3, last one 11-02-2009 at [facility]   goiter     goiter removed     MASS EXCISION Right 10/18/2015   Procedure: EXCISION OF 6CM MASS ON RIGHT  NECK/BACK;  Surgeon: [name], MD;  Location: [place];  Service: General;  Laterality: Right;   PACEMAKER GENERATOR CHANGE N/A 01/15/2012   Procedure: PACEMAKER GENERATOR CHANGE;  Surgeon: Deboraha Sprang, MD;  Location: [facility] C[facility] LAB;  Service: Cardiovascular;  Laterality: N/A;   PACEMAKER INSERTION     Permanent. [employer] En[employer] 04/2006, set as DDDR   TUMOR REMOVAL  2000   Fibroid from breast x4-most recent   WRIST FRACTURE SURGERY  11-2007   right, had metal plate inserted   Social History   Socioeconomic History   Marital status: Widowed    Spouse name: Not on file  Number of children: Not on file   Years of education: Not on file   Highest education level: Not on file  Occupational History   Not on file  Tobacco Use   Smoking status: Former   Smokeless tobacco: Never  Vaping Use   Vaping Use: Never used  Substance and Sexual Activity   Alcohol use: No   Drug use: No   Sexual activity: Never  Other Topics Concern   Not on file  Social History Narrative   Right Handed    Lives in a one story home   Social Determinants of Health   Financial Resource Strain: Not on file  Food Insecurity: Not on file  Transportation Needs: Not on file  Physical Activity: Not on file  Stress: Not on file  Social Connections: Not on file   Family History  Problem  Relation Age of Onset   Stomach cancer Father    Atrial fibrillation Brother    Allergies  Allergen Reactions   Crestor [Rosuvastatin Calcium] Other (See Comments)    Caused Myalgias   Effexor [Venlafaxine Hydrochloride] Other (See Comments)    Caused BP to go up   Adhesive [Tape] Rash   Hibiclens [Chlorhexidine Gluconate] Hives   Lipitor [Atorvastatin Calcium] Rash   Nardil Other (See Comments)    Caused BP Problems   Penicillins Swelling   Plavix [Clopidogrel Bisulfate] Rash   Simvastatin Rash   Sulfonamide Derivatives Rash   Zestril [Lisinopril] Rash   Prior to Admission medications   Medication Sig Start Date End Date Taking? Authorizing Provider  acetaminophen (TYLENOL) 500 MG tablet Take 1,000 mg by mouth daily as needed for headache (pain).    [provider]  aspirin EC 81 MG tablet Take 81 mg by mouth at bedtime. 12/24/11   Deboraha Sprang, MD  benazepril (LOTENSIN) 10 MG tablet Take 10 mg by mouth daily with breakfast.    [provider]  Cholecalciferol (VITAMIN D-3) 125 MCG (5000 UT) TABS Take 5,000 Units by mouth daily with lunch.    [provider]  cholestyramine (QUESTRAN) 4 g packet Take 4 g by mouth 2 (two) times daily as needed (diarrhea). 08/13/18   [provider]  fenofibrate 160 MG tablet Take 160 mg by mouth at bedtime. Take with food 12/30/18   [provider]  ferrous sulfate 325 (65 FE) MG tablet Take 325 mg by mouth daily with lunch.    [provider]  fexofenadine (ALLEGRA) 60 MG tablet Take 60 mg by mouth 2 (two) times daily.    [provider]  fluticasone (FLONASE) 50 MCG/ACT nasal spray Place 2 sprays into the nose daily as needed for allergies.     [provider]  furosemide (LASIX) 20 MG tablet TAKE 1 TABLET BY MOUTH 3 TIMES A WEEK. 05/07/21   Jerline Pain, MD  gabapentin (NEURONTIN) 300 MG capsule Take 300 mg by mouth 2 (two) times daily.    [provider]   galantamine (RAZADYNE ER) 8 MG 24 hr capsule Take 8 mg by mouth daily with breakfast. 08/13/18   [provider]  hydrOXYzine (ATARAX/VISTARIL) 25 MG tablet Take 50 mg by mouth at bedtime.    [provider]  levothyroxine (SYNTHROID, LEVOTHROID) 75 MCG tablet Take 75 mcg by mouth daily before breakfast.    [provider]  metFORMIN (GLUCOPHAGE-XR) 500 MG 24 hr tablet Take 500 mg by mouth in the morning and at bedtime. 10/05/20   [provider]  metoprolol succinate (TOPROL-XL) 100 MG 24 hr tablet TAKE 1 TABLET(100 MG) BY MOUTH DAILY 04/25/21   Jerline Pain, MD  Multiple Vitamin (MULTIVITAMIN WITH MINERALS) TABS tablet Take 1 tablet by mouth daily with breakfast.    [provider]  nortriptyline (PAMELOR) 25 MG capsule Take 25 mg by mouth at bedtime. Take with a 75 mg capsule for a total dose of 100 mg nightly 12/05/20   [provider]  nortriptyline (PAMELOR) 75 MG capsule Take 75 mg by mouth at bedtime. Take with a 25 mg capsule for a total dose of 100 mg nightly    [provider]  pantoprazole (PROTONIX) 40 MG tablet Take 40 mg by mouth See admin instructions. Take one tablet (40 mg) by mouth twice daily - lunch and bedtime 06/01/18   [provider]  PRESCRIPTION MEDICATION Inhale into the lungs at bedtime. CPAP    [provider]  spironolactone (ALDACTONE) 25 MG tablet Take 1 tablet (25 mg total) by mouth daily. 10/03/17   Deboraha Sprang, MD  vitamin C (ASCORBIC ACID) 500 MG tablet Take 500 mg by mouth daily with lunch.    [provider]    ROS: All other systems have been reviewed and were otherwise negative with the exception of those mentioned in the HPI and as above.  Physical Exam: General: Alert, no acute distress Cardiovascular: No pedal edema Respiratory: No cyanosis, no use of accessory musculature GI: No organomegaly, abdomen is soft and non-tender Skin: No lesions in the area of chief  complaint Neurologic: Sensation intact distally Psychiatric: Patient is competent for consent with normal mood and affect Lymphatic: No axillary or cervical lymphadenopathy  MUSCULOSKELETAL:  On examination patient is an elderly female with bilateral facial ecchymosis and ecchymosis overlying her right shoulder, mostly in the bicipital region.  She has firing of the deltoid on examination on the right.  She has forward flexion to 20 degrees.  Internal rotation to L5.  External rotation decreased by 15 degrees.  She is neurovascularly intact distally.  Imaging: Two views of the shoulder do not show any fracture.  She does have about 5.2 millimeters between the acromion and humeral head, likely suggestive of a cuff tear.  There is some mild to moderate degenerative changes throughout and some osteopenic changes noted.    BMI: Estimated body mass index is 26.24 kg/m as calculated from the following:   Height as of 04/22/22: 5' 1.25" (1.556 m).   Weight as of 04/22/22: 63.5 kg.  Lab Results  Component Value Date   ALBUMIN 4.4 10/16/2018   Diabetes:   Patient has a diagnosis of diabetes,  Lab Results  Component Value Date   HGBA1C 6.5 (H) 04/22/2022   Smoking Status: Social History   Tobacco Use  Smoking Status Former  Smokeless Tobacco Never   The patient is not currently a tobacco user. Counseling given: Not Answered     Assessment: RIGHT SHOULDER DISLOCATIONS  Plan: Plan for Procedure(s): REVERSE SHOULDER ARTHROPLASTY  The risks benefits and alternatives were discussed with the patient including but not limited to the risks of nonoperative treatment, versus surgical intervention including infection, bleeding, nerve injury,  blood clots, cardiopulmonary complications, morbidity, mortality, among others, and they were willing to proceed.   We additionally specifically discussed risks of axillary nerve injury, infection, periprosthetic fracture, continued pain and longevity  of implants prior to beginning procedure.    Patient will be admitted for inpatient treatment for surgery, pain control, OT, prophylactic  antibiotics, VTE prophylaxis, and discharge planning. The patient is planning [facility] with [facility] PT.   The patient acknowledged the explanation, agreed to proceed with the plan and consent was signed.   Operative Plan: Right reverse total shoulder arthroplasty Discharge Medications: Standard DVT Prophylaxis: Aspirin BID Physical Therapy: Outpatient PT Special Discharge needs: Sling. IceMan  Plan to discharge  [place] the following morning after surgery. Outpatient physical therapy has been set up for her.   Ethelda Chick, PA-C  04/29/2022 8:20 AM

## 2022-04-30 ENCOUNTER — Encounter (HOSPITAL_COMMUNITY): Payer: Self-pay | Admitting: Orthopaedic Surgery

## 2022-04-30 DIAGNOSIS — R296 Repeated falls: Secondary | ICD-10-CM | POA: Diagnosis not present

## 2022-04-30 NOTE — Progress Notes (Addendum)
[facility] where pt resides and spoke with Varney Biles who is nurse for pt.  Varney Biles states she has not received from pt or daughter any preop instructions for pt.Virgel Gess to [facility] preop instructions with confirmation.  Requested from [place] copy of MAR.  She is to fax.   Have also called and requested Device Rep from [employer] call in regards to whether or not device rep will be neededfro surgerry since device rep had stated last week that pacemaker only used 1% of time and he was to discusee with device clinice and preop nurse has not heard back from anyone.   Faxed to pharmacy copy of MAR from [facility].  Spoke with Marylyn Ishihara in Pharmacy and he is expecting fax.  Faxed to [phone].  At 1200 noon [employer] rep has never called back.  Put in another call to [employer] Rep.  [employer] Rep to be here in am on 05/01/22.  Spoke with Tobias Alexander.

## 2022-05-01 ENCOUNTER — Observation Stay (HOSPITAL_COMMUNITY): Payer: Medicare Other

## 2022-05-01 ENCOUNTER — Ambulatory Visit (HOSPITAL_BASED_OUTPATIENT_CLINIC_OR_DEPARTMENT_OTHER): Payer: Medicare Other | Admitting: Anesthesiology

## 2022-05-01 ENCOUNTER — Other Ambulatory Visit: Payer: Self-pay

## 2022-05-01 ENCOUNTER — Encounter (HOSPITAL_COMMUNITY): Payer: Self-pay | Admitting: Orthopaedic Surgery

## 2022-05-01 ENCOUNTER — Ambulatory Visit (HOSPITAL_COMMUNITY): Payer: Medicare Other | Admitting: Physician Assistant

## 2022-05-01 ENCOUNTER — Observation Stay (HOSPITAL_COMMUNITY)
Admission: RE | Admit: 2022-05-01 | Discharge: 2022-05-03 | Disposition: A | Discharge status: Home / Self Care | Payer: Medicare Other | Source: Ambulatory Visit | Attending: Orthopaedic Surgery | Admitting: Orthopaedic Surgery

## 2022-05-01 ENCOUNTER — Encounter (HOSPITAL_COMMUNITY)
Admission: RE | Disposition: A | Discharge status: Home / Self Care | Payer: Self-pay | Source: Ambulatory Visit | Attending: Orthopaedic Surgery

## 2022-05-01 DIAGNOSIS — M75101 Unspecified rotator cuff tear or rupture of right shoulder, not specified as traumatic: Secondary | ICD-10-CM

## 2022-05-01 DIAGNOSIS — G8918 Other acute postprocedural pain: Secondary | ICD-10-CM | POA: Diagnosis not present

## 2022-05-01 DIAGNOSIS — I5032 Chronic diastolic (congestive) heart failure: Secondary | ICD-10-CM | POA: Diagnosis not present

## 2022-05-01 DIAGNOSIS — M25311 Other instability, right shoulder: Principal | ICD-10-CM | POA: Insufficient documentation

## 2022-05-01 DIAGNOSIS — I2510 Atherosclerotic heart disease of native coronary artery without angina pectoris: Secondary | ICD-10-CM | POA: Insufficient documentation

## 2022-05-01 DIAGNOSIS — E119 Type 2 diabetes mellitus without complications: Secondary | ICD-10-CM | POA: Diagnosis not present

## 2022-05-01 DIAGNOSIS — Z7982 Long term (current) use of aspirin: Secondary | ICD-10-CM | POA: Insufficient documentation

## 2022-05-01 DIAGNOSIS — Z87891 Personal history of nicotine dependence: Secondary | ICD-10-CM | POA: Diagnosis not present

## 2022-05-01 DIAGNOSIS — Z79899 Other long term (current) drug therapy: Secondary | ICD-10-CM | POA: Insufficient documentation

## 2022-05-01 DIAGNOSIS — S43004A Unspecified dislocation of right shoulder joint, initial encounter: Secondary | ICD-10-CM | POA: Diagnosis not present

## 2022-05-01 DIAGNOSIS — N189 Chronic kidney disease, unspecified: Secondary | ICD-10-CM | POA: Insufficient documentation

## 2022-05-01 DIAGNOSIS — Z7984 Long term (current) use of oral hypoglycemic drugs: Secondary | ICD-10-CM | POA: Insufficient documentation

## 2022-05-01 DIAGNOSIS — I129 Hypertensive chronic kidney disease with stage 1 through stage 4 chronic kidney disease, or unspecified chronic kidney disease: Secondary | ICD-10-CM | POA: Insufficient documentation

## 2022-05-01 DIAGNOSIS — Z950 Presence of cardiac pacemaker: Secondary | ICD-10-CM | POA: Diagnosis not present

## 2022-05-01 DIAGNOSIS — I110 Hypertensive heart disease with heart failure: Secondary | ICD-10-CM

## 2022-05-01 DIAGNOSIS — W19XXXA Unspecified fall, initial encounter: Secondary | ICD-10-CM | POA: Diagnosis not present

## 2022-05-01 DIAGNOSIS — E1122 Type 2 diabetes mellitus with diabetic chronic kidney disease: Secondary | ICD-10-CM | POA: Diagnosis not present

## 2022-05-01 DIAGNOSIS — E039 Hypothyroidism, unspecified: Secondary | ICD-10-CM | POA: Diagnosis not present

## 2022-05-01 DIAGNOSIS — M19011 Primary osteoarthritis, right shoulder: Secondary | ICD-10-CM | POA: Diagnosis not present

## 2022-05-01 DIAGNOSIS — Z96611 Presence of right artificial shoulder joint: Secondary | ICD-10-CM | POA: Diagnosis not present

## 2022-05-01 DIAGNOSIS — S43005A Unspecified dislocation of left shoulder joint, initial encounter: Secondary | ICD-10-CM | POA: Insufficient documentation

## 2022-05-01 DIAGNOSIS — Z01818 Encounter for other preprocedural examination: Secondary | ICD-10-CM

## 2022-05-01 HISTORY — PX: REVERSE SHOULDER ARTHROPLASTY: SHX5054

## 2022-05-01 LAB — GLUCOSE, CAPILLARY
Glucose-Capillary: 119 mg/dL — ABNORMAL HIGH (ref 70–99)
Glucose-Capillary: 115 mg/dL — ABNORMAL HIGH (ref 70–99)

## 2022-05-01 SURGERY — ARTHROPLASTY, SHOULDER, TOTAL, REVERSE
Anesthesia: General | Site: Shoulder | Laterality: Right

## 2022-05-01 MED ORDER — FENTANYL CITRATE (PF) 100 MCG/2ML IJ SOLN
INTRAMUSCULAR | Status: AC
  Filled 2022-05-01: qty 2

## 2022-05-01 MED ORDER — PHENYLEPHRINE HCL-NACL 20-0.9 MG/250ML-% IV SOLN
INTRAVENOUS | Status: DC | PRN
  Administered 2022-05-01: 50 ug/min via INTRAVENOUS

## 2022-05-01 MED ORDER — OXYCODONE HCL 5 MG PO TABS: 5.0000 mg | ORAL_TABLET | Freq: Once | ORAL | Status: DC | PRN

## 2022-05-01 MED ORDER — ORAL CARE MOUTH RINSE: 15.0000 mL | Freq: Once | OROMUCOSAL | Status: AC

## 2022-05-01 MED ORDER — GABAPENTIN 300 MG PO CAPS
300.0000 mg | ORAL_CAPSULE | Freq: Three times a day (TID) | ORAL | Status: DC
  Administered 2022-05-01 – 2022-05-03 (×6): 300 mg via ORAL
  Filled 2022-05-01 (×6): qty 1

## 2022-05-01 MED ORDER — ONDANSETRON HCL 4 MG/2ML IJ SOLN: 4.0000 mg | Freq: Once | INTRAMUSCULAR | Status: DC | PRN

## 2022-05-01 MED ORDER — DOCUSATE SODIUM 100 MG PO CAPS
100.0000 mg | ORAL_CAPSULE | Freq: Two times a day (BID) | ORAL | Status: DC
Start: 2022-05-01 — End: 2022-05-03
  Administered 2022-05-01 – 2022-05-03 (×4): 100 mg via ORAL
  Filled 2022-05-01 (×4): qty 1

## 2022-05-01 MED ORDER — LIDOCAINE HCL (CARDIAC) PF 100 MG/5ML IV SOSY
PREFILLED_SYRINGE | INTRAVENOUS | Status: DC | PRN
  Administered 2022-05-01: 60 mg via INTRAVENOUS

## 2022-05-01 MED ORDER — PHENYLEPHRINE 80 MCG/ML (10ML) SYRINGE FOR IV PUSH (FOR BLOOD PRESSURE SUPPORT)
PREFILLED_SYRINGE | INTRAVENOUS | Status: DC | PRN
  Administered 2022-05-01: 160 ug via INTRAVENOUS

## 2022-05-01 MED ORDER — HYDROMORPHONE HCL 1 MG/ML IJ SOLN
0.2500 mg | INTRAMUSCULAR | Status: DC | PRN
  Administered 2022-05-02: 0.5 mg via INTRAVENOUS
  Filled 2022-05-01: qty 0.5

## 2022-05-01 MED ORDER — OXYCODONE HCL 5 MG/5ML PO SOLN: 5.0000 mg | Freq: Once | ORAL | Status: DC | PRN

## 2022-05-01 MED ORDER — ONDANSETRON HCL 4 MG/2ML IJ SOLN
INTRAMUSCULAR | Status: DC | PRN
  Administered 2022-05-01: 4 mg via INTRAVENOUS

## 2022-05-01 MED ORDER — SPIRONOLACTONE 25 MG PO TABS
25.0000 mg | ORAL_TABLET | Freq: Every day | ORAL | Status: DC
  Administered 2022-05-02 – 2022-05-03 (×2): 25 mg via ORAL
  Filled 2022-05-01 (×2): qty 1

## 2022-05-01 MED ORDER — PHENYLEPHRINE 80 MCG/ML (10ML) SYRINGE FOR IV PUSH (FOR BLOOD PRESSURE SUPPORT)
PREFILLED_SYRINGE | INTRAVENOUS | Status: AC
  Filled 2022-05-01: qty 10

## 2022-05-01 MED ORDER — EPHEDRINE 5 MG/ML INJ
INTRAVENOUS | Status: AC
  Filled 2022-05-01: qty 5

## 2022-05-01 MED ORDER — FENTANYL CITRATE (PF) 100 MCG/2ML IJ SOLN
INTRAMUSCULAR | Status: DC | PRN
  Administered 2022-05-01: 50 ug via INTRAVENOUS
  Administered 2022-05-01: 25 ug via INTRAVENOUS

## 2022-05-01 MED ORDER — METOPROLOL SUCCINATE ER 50 MG PO TB24
100.0000 mg | ORAL_TABLET | Freq: Every day | ORAL | Status: DC
  Administered 2022-05-02 – 2022-05-03 (×2): 100 mg via ORAL
  Filled 2022-05-01 (×2): qty 2

## 2022-05-01 MED ORDER — METOCLOPRAMIDE HCL 5 MG/ML IJ SOLN: 5.0000 mg | Freq: Three times a day (TID) | INTRAMUSCULAR | Status: DC | PRN

## 2022-05-01 MED ORDER — BUPIVACAINE HCL (PF) 0.5 % IJ SOLN
INTRAMUSCULAR | Status: DC | PRN
  Administered 2022-05-01: 15 mL via PERINEURAL

## 2022-05-01 MED ORDER — GALANTAMINE HYDROBROMIDE ER 8 MG PO CP24
8.0000 mg | ORAL_CAPSULE | Freq: Every day | ORAL | Status: DC
  Administered 2022-05-02 – 2022-05-03 (×2): 8 mg via ORAL
  Filled 2022-05-01 (×3): qty 1

## 2022-05-01 MED ORDER — ROCURONIUM BROMIDE 10 MG/ML (PF) SYRINGE
PREFILLED_SYRINGE | INTRAVENOUS | Status: AC
  Filled 2022-05-01: qty 10

## 2022-05-01 MED ORDER — OXYCODONE HCL 5 MG PO TABS
10.0000 mg | ORAL_TABLET | ORAL | Status: DC | PRN
  Administered 2022-05-01 – 2022-05-02 (×3): 10 mg via ORAL
  Filled 2022-05-01 (×4): qty 2

## 2022-05-01 MED ORDER — MENTHOL 3 MG MT LOZG
1.0000 | LOZENGE | OROMUCOSAL | Status: DC | PRN
Start: 2022-05-01 — End: 2022-05-03

## 2022-05-01 MED ORDER — BUPIVACAINE LIPOSOME 1.3 % IJ SUSP
INTRAMUSCULAR | Status: DC | PRN
  Administered 2022-05-01: 10 mL via PERINEURAL

## 2022-05-01 MED ORDER — PROPOFOL 10 MG/ML IV BOLUS
INTRAVENOUS | Status: AC
  Filled 2022-05-01: qty 20

## 2022-05-01 MED ORDER — CEFAZOLIN SODIUM-DEXTROSE 1-4 GM/50ML-% IV SOLN
1.0000 g | Freq: Four times a day (QID) | INTRAVENOUS | Status: AC
  Administered 2022-05-01 – 2022-05-02 (×3): 1 g via INTRAVENOUS
  Filled 2022-05-01 (×3): qty 50

## 2022-05-01 MED ORDER — FENOFIBRATE 160 MG PO TABS
160.0000 mg | ORAL_TABLET | Freq: Every day | ORAL | Status: DC
  Administered 2022-05-01 – 2022-05-03 (×3): 160 mg via ORAL
  Filled 2022-05-01 (×3): qty 1

## 2022-05-01 MED ORDER — LEVOTHYROXINE SODIUM 75 MCG PO TABS
75.0000 ug | ORAL_TABLET | Freq: Every day | ORAL | Status: DC
  Administered 2022-05-02 – 2022-05-03 (×2): 75 ug via ORAL
  Filled 2022-05-01 (×2): qty 1

## 2022-05-01 MED ORDER — DEXAMETHASONE SODIUM PHOSPHATE 10 MG/ML IJ SOLN
INTRAMUSCULAR | Status: DC | PRN
  Administered 2022-05-01: 5 mg via INTRAVENOUS

## 2022-05-01 MED ORDER — HYDROXYZINE HCL 25 MG PO TABS
25.0000 mg | ORAL_TABLET | Freq: Every day | ORAL | Status: DC
  Administered 2022-05-01 – 2022-05-02 (×2): 25 mg via ORAL
  Filled 2022-05-01 (×2): qty 1

## 2022-05-01 MED ORDER — OXYCODONE HCL 5 MG PO TABS
5.0000 mg | ORAL_TABLET | ORAL | Status: DC | PRN
  Filled 2022-05-01: qty 1

## 2022-05-01 MED ORDER — STERILE WATER FOR IRRIGATION IR SOLN
Status: DC | PRN
  Administered 2022-05-01: 2000 mL

## 2022-05-01 MED ORDER — BUPIVACAINE HCL 0.25 % IJ SOLN
INTRAMUSCULAR | Status: AC
  Filled 2022-05-01: qty 1

## 2022-05-01 MED ORDER — SODIUM CHLORIDE 0.9 % IR SOLN
Status: DC | PRN
  Administered 2022-05-01: 1000 mL

## 2022-05-01 MED ORDER — 0.9 % SODIUM CHLORIDE (POUR BTL) OPTIME
TOPICAL | Status: DC | PRN
  Administered 2022-05-01: 1000 mL

## 2022-05-01 MED ORDER — BISACODYL 10 MG RE SUPP: 10.0000 mg | Freq: Every day | RECTAL | Status: DC | PRN

## 2022-05-01 MED ORDER — VANCOMYCIN HCL 1000 MG IV SOLR
INTRAVENOUS | Status: AC
Start: 2022-05-01 — End: ?
  Filled 2022-05-01: qty 20

## 2022-05-01 MED ORDER — SUGAMMADEX SODIUM 200 MG/2ML IV SOLN
INTRAVENOUS | Status: DC | PRN
  Administered 2022-05-01: 200 mg via INTRAVENOUS

## 2022-05-01 MED ORDER — TRANEXAMIC ACID-NACL 1000-0.7 MG/100ML-% IV SOLN
1000.0000 mg | INTRAVENOUS | Status: AC
  Administered 2022-05-01: 1000 mg via INTRAVENOUS
  Filled 2022-05-01: qty 100

## 2022-05-01 MED ORDER — DEXAMETHASONE SODIUM PHOSPHATE 10 MG/ML IJ SOLN
INTRAMUSCULAR | Status: AC
  Filled 2022-05-01: qty 1

## 2022-05-01 MED ORDER — HYDROMORPHONE HCL 1 MG/ML IJ SOLN: 0.2500 mg | INTRAMUSCULAR | Status: DC | PRN

## 2022-05-01 MED ORDER — ROCURONIUM BROMIDE 10 MG/ML (PF) SYRINGE
PREFILLED_SYRINGE | INTRAVENOUS | Status: DC | PRN
  Administered 2022-05-01: 160 mg via INTRAVENOUS

## 2022-05-01 MED ORDER — LIDOCAINE HCL (PF) 2 % IJ SOLN
INTRAMUSCULAR | Status: AC
  Filled 2022-05-01: qty 5

## 2022-05-01 MED ORDER — CHLORHEXIDINE GLUCONATE 0.12 % MT SOLN
15.0000 mL | Freq: Once | OROMUCOSAL | Status: AC
  Administered 2022-05-01: 15 mL via OROMUCOSAL

## 2022-05-01 MED ORDER — METHOCARBAMOL 500 MG PO TABS
500.0000 mg | ORAL_TABLET | Freq: Four times a day (QID) | ORAL | Status: DC | PRN
  Administered 2022-05-01 – 2022-05-03 (×5): 500 mg via ORAL
  Filled 2022-05-01 (×5): qty 1

## 2022-05-01 MED ORDER — ACETAMINOPHEN 500 MG PO TABS
1000.0000 mg | ORAL_TABLET | Freq: Three times a day (TID) | ORAL | Status: AC
  Administered 2022-05-01 – 2022-05-02 (×3): 1000 mg via ORAL
  Filled 2022-05-01 (×3): qty 2

## 2022-05-01 MED ORDER — PANTOPRAZOLE SODIUM 40 MG PO TBEC
40.0000 mg | DELAYED_RELEASE_TABLET | Freq: Two times a day (BID) | ORAL | Status: DC
  Administered 2022-05-01 – 2022-05-03 (×4): 40 mg via ORAL
  Filled 2022-05-01 (×4): qty 1

## 2022-05-01 MED ORDER — PHENYLEPHRINE HCL-NACL 20-0.9 MG/250ML-% IV SOLN
INTRAVENOUS | Status: AC
  Filled 2022-05-01: qty 250

## 2022-05-01 MED ORDER — CHOLESTYRAMINE LIGHT 4 G PO PACK
4.0000 g | PACK | Freq: Every day | ORAL | Status: DC
  Administered 2022-05-01: 4 g via ORAL
  Filled 2022-05-01 (×3): qty 1

## 2022-05-01 MED ORDER — ACETAMINOPHEN 500 MG PO TABS
1000.0000 mg | ORAL_TABLET | Freq: Once | ORAL | Status: AC
  Administered 2022-05-01: 1000 mg via ORAL
  Filled 2022-05-01: qty 2

## 2022-05-01 MED ORDER — PROPOFOL 10 MG/ML IV BOLUS
INTRAVENOUS | Status: DC | PRN
  Administered 2022-05-01: 120 mg via INTRAVENOUS

## 2022-05-01 MED ORDER — NORTRIPTYLINE HCL 25 MG PO CAPS
25.0000 mg | ORAL_CAPSULE | Freq: Every day | ORAL | Status: DC
  Administered 2022-05-01 – 2022-05-02 (×2): 25 mg via ORAL
  Filled 2022-05-01 (×2): qty 1

## 2022-05-01 MED ORDER — LACTATED RINGERS IV SOLN: INTRAVENOUS | Status: DC

## 2022-05-01 MED ORDER — METHOCARBAMOL 500 MG IVPB - SIMPLE MED: 500.0000 mg | Freq: Four times a day (QID) | INTRAVENOUS | Status: DC | PRN

## 2022-05-01 MED ORDER — POLYETHYLENE GLYCOL 3350 17 G PO PACK
17.0000 g | PACK | Freq: Every day | ORAL | Status: DC | PRN
  Administered 2022-05-03: 17 g via ORAL
  Filled 2022-05-01: qty 1

## 2022-05-01 MED ORDER — ONDANSETRON HCL 4 MG/2ML IJ SOLN: 4.0000 mg | Freq: Four times a day (QID) | INTRAMUSCULAR | Status: DC | PRN

## 2022-05-01 MED ORDER — ONDANSETRON HCL 4 MG/2ML IJ SOLN
INTRAMUSCULAR | Status: AC
  Filled 2022-05-01: qty 2

## 2022-05-01 MED ORDER — PHENOL 1.4 % MT LIQD: 1.0000 | OROMUCOSAL | Status: DC | PRN

## 2022-05-01 MED ORDER — CEFAZOLIN SODIUM-DEXTROSE 2-4 GM/100ML-% IV SOLN
2.0000 g | INTRAVENOUS | Status: AC
Start: 2022-05-01 — End: 2022-05-01
  Administered 2022-05-01: 2 g via INTRAVENOUS
  Filled 2022-05-01: qty 100

## 2022-05-01 MED ORDER — NORTRIPTYLINE HCL 25 MG PO CAPS
75.0000 mg | ORAL_CAPSULE | Freq: Every day | ORAL | Status: DC
  Administered 2022-05-01 – 2022-05-02 (×2): 75 mg via ORAL
  Filled 2022-05-01 (×2): qty 3

## 2022-05-01 MED ORDER — METOCLOPRAMIDE HCL 5 MG PO TABS: 5.0000 mg | ORAL_TABLET | Freq: Three times a day (TID) | ORAL | Status: DC | PRN

## 2022-05-01 MED ORDER — DIPHENHYDRAMINE HCL 12.5 MG/5ML PO ELIX
12.5000 mg | ORAL_SOLUTION | ORAL | Status: DC | PRN
  Administered 2022-05-02: 12.5 mg via ORAL
  Filled 2022-05-01: qty 5

## 2022-05-01 MED ORDER — ONDANSETRON HCL 4 MG PO TABS
4.0000 mg | ORAL_TABLET | Freq: Four times a day (QID) | ORAL | Status: DC | PRN
  Administered 2022-05-02 (×2): 4 mg via ORAL
  Filled 2022-05-01 (×2): qty 1

## 2022-05-01 MED ORDER — VANCOMYCIN HCL 1 G IV SOLR
INTRAVENOUS | Status: DC | PRN
  Administered 2022-05-01: 1000 mg

## 2022-05-01 SURGICAL SUPPLY — 66 items
BAG COUNTER SPONGE SURGICOUNT (BAG) ×2 IMPLANT
BASEPLATE GLENOSPHERE 25 STD (Miscellaneous) ×1 IMPLANT
BENZOIN TINCTURE PRP APPL 2/3 (GAUZE/BANDAGES/DRESSINGS) ×1 IMPLANT
BIT DRILL 3.2 PERIPHERAL SCREW (BIT) ×1 IMPLANT
BLADE SAW SGTL 73X25 THK (BLADE) ×2 IMPLANT
CHLORAPREP W/TINT 26 (MISCELLANEOUS) ×4 IMPLANT
CLSR STERI-STRIP ANTIMIC 1/2X4 (GAUZE/BANDAGES/DRESSINGS) ×2 IMPLANT
COOLER ICEMAN CLASSIC (MISCELLANEOUS) ×1 IMPLANT
COVER BACK TABLE 60X90IN (DRAPES) ×2 IMPLANT
COVER SURGICAL LIGHT HANDLE (MISCELLANEOUS) ×2 IMPLANT
DRAPE C-ARM 42X120 X-RAY (DRAPES) IMPLANT
DRAPE INCISE IOBAN 66X45 STRL (DRAPES) ×2 IMPLANT
DRAPE ORTHO SPLIT 77X108 STRL (DRAPES) ×4
DRAPE SHEET LG 3/4 BI-LAMINATE (DRAPES) ×4 IMPLANT
DRAPE SURG ORHT 6 SPLT 77X108 (DRAPES) ×2 IMPLANT
DRSG AQUACEL AG ADV 3.5X 6 (GAUZE/BANDAGES/DRESSINGS) ×2 IMPLANT
ELECT BLADE TIP CTD 4 INCH (ELECTRODE) ×2 IMPLANT
ELECT PENCIL ROCKER SW 15FT (MISCELLANEOUS) IMPLANT
ELECT REM PT RETURN 15FT ADLT (MISCELLANEOUS) ×2 IMPLANT
FACESHIELD WRAPAROUND (MASK) ×2 IMPLANT
FACESHIELD WRAPAROUND OR TEAM (MASK) ×1 IMPLANT
GLENOSPHERE REV SHOULDER 36 (Joint) ×1 IMPLANT
GLOVE BIO SURGEON STRL SZ 6.5 (GLOVE) ×4 IMPLANT
GLOVE BIOGEL PI IND STRL 6.5 (GLOVE) ×1 IMPLANT
GLOVE BIOGEL PI IND STRL 8 (GLOVE) ×1 IMPLANT
GLOVE BIOGEL PI INDICATOR 6.5 (GLOVE) ×1
GLOVE BIOGEL PI INDICATOR 8 (GLOVE) ×1
GLOVE ECLIPSE 8.0 STRL XLNG CF (GLOVE) ×4 IMPLANT
GOWN STRL REUS W/ TWL LRG LVL3 (GOWN DISPOSABLE) ×2 IMPLANT
GOWN STRL REUS W/TWL LRG LVL3 (GOWN DISPOSABLE) ×4
GUIDEWIRE GLENOID 2.5X220 (WIRE) ×1 IMPLANT
HANDPIECE INTERPULSE COAX TIP (DISPOSABLE) ×2
INSERT HUMERAL 36X6MM 12.5DEG (Insert) ×1 IMPLANT
KIT BASIN OR (CUSTOM PROCEDURE TRAY) ×2 IMPLANT
KIT STABILIZATION SHOULDER (MISCELLANEOUS) ×2 IMPLANT
KIT TURNOVER KIT A (KITS) ×1 IMPLANT
MANIFOLD NEPTUNE II (INSTRUMENTS) ×2 IMPLANT
NDL MAYO CATGUT SZ4 TPR NDL (NEEDLE) IMPLANT
NEEDLE MAYO CATGUT SZ4 (NEEDLE) IMPLANT
NS IRRIG 1000ML POUR BTL (IV SOLUTION) ×2 IMPLANT
PACK SHOULDER (CUSTOM PROCEDURE TRAY) ×2 IMPLANT
PAD COLD SHLDR WRAP-ON (PAD) ×1 IMPLANT
RESTRAINT HEAD UNIVERSAL NS (MISCELLANEOUS) ×2 IMPLANT
SCREW 5.0X18 (Screw) ×1 IMPLANT
SCREW 5.5X26 (Screw) ×1 IMPLANT
SCREW BONE INTRNL SM 7 (Screw) ×1 IMPLANT
SET HNDPC FAN SPRY TIP SCT (DISPOSABLE) ×1 IMPLANT
SLING ULTRA II L (ORTHOPEDIC SUPPLIES) ×1 IMPLANT
SLING ULTRA III MED (ORTHOPEDIC SUPPLIES) ×2 IMPLANT
SPONGE T-LAP 4X18 ~~LOC~~+RFID (SPONGE) ×2 IMPLANT
STEM HUMERAL SZ2B STND 70 PTC (Stem) ×2 IMPLANT
STEM HUMERAL SZ2BSTD 70 PTC (Stem) IMPLANT
SUCTION FRAZIER HANDLE 12FR (TUBING) ×2
SUCTION TUBE FRAZIER 12FR DISP (TUBING) ×1 IMPLANT
SUT ETHIBOND 2 V 37 (SUTURE) ×2 IMPLANT
SUT ETHIBOND NAB CT1 #1 30IN (SUTURE) ×2 IMPLANT
SUT FIBERWIRE #5 38 CONV NDL (SUTURE) ×4
SUT MNCRL AB 4-0 PS2 18 (SUTURE) ×2 IMPLANT
SUT VIC AB 0 CT1 36 (SUTURE) ×1 IMPLANT
SUT VIC AB 3-0 SH 27 (SUTURE) ×2
SUT VIC AB 3-0 SH 27X BRD (SUTURE) ×1 IMPLANT
SUTURE FIBERWR #5 38 CONV NDL (SUTURE) IMPLANT
TOWEL OR 17X26 10 PK STRL BLUE (TOWEL DISPOSABLE) ×2 IMPLANT
TRAY SHOULDER REV OFFSET 1.5 (Joint) ×1 IMPLANT
TUBE SUCTION HIGH CAP CLEAR NV (SUCTIONS) ×2 IMPLANT
WATER STERILE IRR 1000ML POUR (IV SOLUTION) ×4 IMPLANT

## 2022-05-01 NOTE — Plan of Care (Signed)

## 2022-05-01 NOTE — Discharge Instructions (Addendum)
Ophelia Charter MD, MPH Noemi Chapel, PA-C [place] [address], [address] [phone] (tel)   [phone] (fax)   [place] may leave the operative dressing in place until your follow-up appointment. KEEP THE INCISIONS CLEAN AND DRY. There may be a small amount of fluid/bleeding leaking at the surgical site. This is normal after surgery.  If it fills with liquid or blood please call us immediately to change it for you. Use the provided ice machine or Ice packs as often as possible for the first 3-4 days, then as needed for pain relief.   Keep a layer of cloth or a shirt between your skin and the cooling unit to prevent frost bite as it can get very cold.  SHOWERING: - You may shower on Post-Op Day #2.  - The dressing is water resistant but do not scrub it as it may start to peel up.   - You may remove the sling for showering - Gently pat the area dry.  - Do not soak the shoulder in water.  - Do not go swimming in the pool or ocean until your incision has completely healed (about 4-6 weeks after surgery) - KEEP THE INCISIONS CLEAN AND DRY.  EXERCISES Wear the sling at all times  You may remove the sling for showering, but keep the arm across the chest or in a secondary sling.    Accidental/Purposeful External Rotation and shoulder flexion (reaching behind you) is to be avoided at all costs for the first month.  You may remove your sling for   - showering  - when working with physical therapy or doing home exercises  - you may also come out of your sling in order to use your walker  Otherwise sling should be in place at all times (including when you sleep!)   Do not lift anything heavier than a coffee cup with your operative arm   It is normal for your fingers/hand to become more swollen after surgery and discolored from bruising.   This will resolve over the first few weeks usually  after surgery. Please continue to ambulate and do not stay sitting or lying for too long.  Perform foot and wrist pumps to assist in circulation.  PHYSICAL THERAPY - You will begin physical therapy soon after surgery (unless otherwise specified) - Please call to set up an appointment, if you do not already have one  - Let our office if there are any issues with scheduling your therapy  - You have a physical therapy appointment scheduled at [facility] (across the hall from our office) on Friday July 28th at 10 am  REGIONAL ANESTHESIA ([place]) The anesthesia team may have performed a nerve block for you this is a great tool used to minimize pain.   The block may start wearing off overnight (between 8-24 hours postop) When the block wears off, your pain may go from nearly zero to the pain you would have had postop without the block. This is an abrupt transition but nothing dangerous is happening.   This can be a challenging period but utilize your as needed pain medications to try and manage this period. We suggest you use the pain medication the first night prior to going to bed, to ease this transition.  You may take an extra dose of narcotic when this happens if needed   POST-OP MEDICATIONS- Multimodal approach to pain control In general  your pain will be controlled with a combination of substances.  Prescriptions unless otherwise discussed are electronically sent to your pharmacy.  This is a carefully made plan we use to minimize narcotic use.     Meloxicam - Anti-inflammatory medication taken on a scheduled basis Acetaminophen - Non-narcotic pain medicine taken on a scheduled basis  Tramadol - This is a strong narcotic, to be used only on an "as needed" basis for SEVERE pain. Aspirin '81mg'$  - This medicine is used to minimize the risk of blood clots after surgery. Zofran -  take as needed for nausea Robaxin - this is a muscle relaxer, take as needed for muscle spasms   FOLLOW-UP If  you develop a Fever (>101.5), Redness or Drainage from the surgical incision site, please call our office to arrange for an evaluation. Please call the office to schedule a follow-up appointment for a wound check, 7-10 days post-operatively.  IF YOU HAVE ANY QUESTIONS, PLEASE FEEL FREE TO CALL OUR OFFICE.  HELPFUL INFORMATION  Your arm will be in a sling following surgery. You will be in this sling for the next 4 weeks.   You may be more comfortable sleeping in a semi-seated position the first few nights following surgery.  Keep a pillow propped under the elbow and forearm for comfort.  If you have a recliner type of chair it might be beneficial.  If not that is fine too, but it would be helpful to sleep propped up with pillows behind your operated shoulder as well under your elbow and forearm.  This will reduce pulling on the suture lines.  When dressing, put your operative arm in the sleeve first.  When getting undressed, take your operative arm out last.  Loose fitting, button-down shirts are recommended.  In most states it is against the law to drive while your arm is in a sling. And certainly against the law to drive while taking narcotics.  You may return to work/school in the next couple of days when you feel up to it. Desk work and typing in the sling is fine.  We suggest you use the pain medication the first night prior to going to bed, in order to ease any pain when the anesthesia wears off. You should avoid taking pain medications on an empty stomach as it will make you nauseous.  You should wean off your narcotic medicines as soon as you are able.     Most patients will be off or using minimal narcotics before their first postop appointment.   Do not drink alcoholic beverages or take illicit drugs when taking pain medications.  Pain medication may make you constipated.  Below are a few solutions to try in this order: Decrease the amount of pain medication if you aren't having  pain. Drink lots of decaffeinated fluids. Drink prune juice and/or each dried prunes  If the first 3 don't work start with additional solutions Take Colace - an over-the-counter stool softener Take Senokot - an over-the-counter laxative Take Miralax - a stronger over-the-counter laxative   Dental Antibiotics:  In most cases prophylactic antibiotics for Dental procdeures after total joint surgery are not necessary.  Exceptions are as follows:  1. History of prior total joint infection  2. Severely immunocompromised (Organ Transplant, cancer chemotherapy, Rheumatoid biologic meds such as [place])  3. Poorly controlled diabetes (A1C &gt; 8.0, blood glucose over 200)  If you have one of these conditions, contact your surgeon for an antibiotic prescription, prior to your dental procedure.  For more information including helpful videos and documents visit our website:   [email or web address]

## 2022-05-01 NOTE — Op Note (Signed)
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  Patient had extraordinarily small anatomy and even with CT blueprint planning we had a limited vault to work with.  Her bone quality was also quite poor in the glenoid.  That said the patient uses a walker for ambulation will have to let her use the implant early for mobility purposes.  She understands there is a risk of fixation loss of the glenoid.  She is at higher than normal risk of dislocation and loss of fixation.  X-ray nerve tug test was not able to be performed secondary to very minimal subscapularis tissue and significant subcoracoid scarring.  Post-operative plan: The patient will be NWB in sling.  The patient will be will be admitted to observation due to medical complexity, monitoring and pain management.  DVT prophylaxis Aspirin 81 mg twice daily for 6 weeks.  Pain control with PRN pain medication preferring oral medicines.  Follow up plan will be scheduled in approximately 7 days for incision check and XR.  Physical therapy to start immediately.  Implants: Tornier size 2 stem, 0 low offset tray with a +6 polyethylene, 36 standard glenosphere with a 25 standard baseplate and a short ingrowth post with 2 peripheral locking screws.  Post-Op Diagnosis: Same Surgeons:Primary: Hiram Gash, MD Assistants:Caroline McBane PA-C Location: [facility] ROOM 06 Anesthesia: General with Exparel Interscalene Antibiotics: Ancef 2g preop, Vancomycin 1070m locally Tourniquet time: None Estimated Blood Loss: 1937Complications: None Specimens: None Implants: Implant Name Type Inv. Item Serial No. Manufacturer Lot No. LRB No. Used Action  SCREW BONE INTRNL SM 7 -  [id number] SCREW BONE INTRNL SM 7  [employer] [id number] 1 Implanted  BASEPLATE GLENOSPHERE 25 STD - [id number] BASEPLATE GLENOSPHERE 25 STD  [employer] [id number] 1 Implanted  GLENOSPHERE REV SHOULDER 36 - [id number] GLENOSPHERE REV SHOULDER 36  [employer] [id number] 1 Implanted  INSERT HUMERAL 36X6MM 12.5DEG - [id number] INSERT HUMERAL 36X6MM 12.5DEG  [employer] [id number] 1 Implanted  TRAY SHOULDER REV OFFSET 1.5 - [id number] TRAY SHOULDER REV OFFSET 1.5  [employer] [id number] 1 Implanted  STEM HUMERAL SZ2B STND 70 PTC - [id number] STEM HUMERAL SZ2B STND 70 PTC  [employer] [id number] 1 Implanted    Indications for Surgery:   AOZELLA COMINSis a 81y.o. female with recurrent shoulder instability with a irreparable cuff tear.  Benefits and risks of operative and nonoperative management were discussed prior to surgery with patient/guardian(s) and informed consent form was completed.  Infection and need for further surgery were discussed as was prosthetic stability and cuff issues.  We additionally specifically discussed risks of axillary nerve injury, infection, periprosthetic fracture, continued pain and longevity of implants prior to beginning procedure.      Procedure:   The patient was identified in the preoperative holding area where the surgical site was marked. Block placed by anesthesia with exparel.  The patient was taken to the OR where a procedural timeout was called and the above noted anesthesia was induced.  The patient was positioned beachchair on allen table with spider arm positioner.  Preoperative antibiotics were dosed.  The patient's right shoulder was prepped and draped in the usual sterile fashion.  A  second preoperative timeout was called.       Standard deltopectoral approach was performed with a #10 blade. We dissected down to the subcutaneous tissues and the cephalic vein was taken laterally with  the deltoid. Clavipectoral fascia was incised in line with the incision. Deep retractors were placed. The long of the biceps tendon was identified and there was significant tenosynovitis present.  Tenodesis was performed to the pectoralis tendon with #2 Ethibond. The remaining biceps was followed up into the rotator interval where it was released.   The subscapularis was taken down in a full thickness layer with capsule along the humeral neck extending inferiorly around the humeral head. We continued releasing the capsule directly off of the osteophytes inferiorly all the way around the corner. This allowed [place] to dislocate the humeral head.   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 There was significant flattening of the humeral head.   The rotator cuff was carefully examined and noted to be irreperably torn.  The decision was confirmed that a reverse total shoulder was indicated for this patient.  There were osteophytes along the inferior humeral neck. The osteophytes were removed with an osteotome and a rongeur.  Osteophytes were removed with a rongeur and an osteotome and the anatomic neck was well visualized.     A humeral cutting guide was inserted down the intramedullary canal. The version was set at 20 of retroversion. Humeral osteotomy was performed with an oscillating saw. The head fragment was passed off the back table. A starter awl was used to open the humeral canal. We next used T-handle straight sound reamers to ream up to an appropriate fit. A chisel was used to remove proximal humeral bone. We then broached starting with a size one broach and broaching up to 2 which obtained an appropriate fit. The broach handle was removed. A cut protector was placed. The broach handle was removed and a cut protector was placed. The humerus was retracted posteriorly and we turned our attention to glenoid exposure.  The  subscapularis was again identified and immediately we took care to palpate the axillary nerve anteriorly and verify its position with gentle palpation as well as the tug test.  We then released the SGHL with bovie cautery prior to placing a curved mayo at the junction of the anterior glenoid well above the axillary nerve and bluntly dissecting the subscapularis from the capsule.  We then carefully protected the axillary nerve as we gently released the inferior capsule to fully mobilize the subscapularis.  An anterior deltoid retractor was then placed as well as a small Hohmann retractor superiorly.   The glenoid was relatively intact in the setting of an irreparable cuff tear.  The glenoid was quite small and due to the patient's anatomy we used CT blueprint protocol planning to plan for a ingrowth post rather than a screw.  The remaining labrum was removed circumferentially taking great care not to disrupt the posterior capsule.   The glenoid drill guide was placed and used to drill a guide pin in the center, inferior position. The glenoid face was then reamed concentrically over the guide wire.  We did inferiorly in clinic the glenosphere and plate to aid in mobility with the patient using a walker the center hole was drilled over the guidepin in a near anatomic angle of version.  We drilled for a short ingrowth post over the wire.  We then placed a 25  standard glenosphere and is short ingrowth post.  Placed 2 peripheral locking screws.  Next a 36 mm glenosphere was selected and impacted onto the baseplate. The center screw was tightened.  We turned attention back to the humeral side. The cut protector was removed. We trialed with multiple size tray and polyethylene options and selected a 6 which provided good stability and range of motion without excess soft tissue tension. The offset was dialed in to match the normal anatomy. The shoulder was trialed.  There was good ROM in all planes and the shoulder  was stable with no inferior translation.  The real humeral implants were opened after again confirming sizes.  The trial was removed. #5 Fiberwire x4 sutures passed through the humeral neck for subscap repair. The humeral component was press-fit obtaining a secure fit. A +0 low offset tray was selected and impacted onto the stem.  A 36+6 polyethylene liner was impacted onto the stem.  The joint was reduced and thoroughly irrigated with pulsatile lavage. Subscap was repaired back with #5 Fiberwire sutures through bone tunnels. Hemostasis was obtained. The deltopectoral interval was reapproximated with #1 Ethibond. The subcutaneous tissues were closed with 2-0 Vicryl and the skin was closed with running monocryl.    The wounds were cleaned and dried and an Aquacel dressing was placed. The drapes taken down. The arm was placed into sling with abduction pillow. Patient was awakened, extubated, and transferred to the recovery room in stable condition. There were no intraoperative complications. The sponge, needle, and attention counts were  correct at the end of the case.       Noemi Chapel, PA-C, present and scrubbed throughout the case, critical for completion in a timely fashion, and for retraction, instrumentation, closure.

## 2022-05-01 NOTE — Anesthesia Procedure Notes (Signed)
Procedure Name: Intubation Date/Time: 05/01/2022 7:32 AM  Performed by: Lind Covert, CRNAPre-anesthesia Checklist: Patient identified, Emergency Drugs available, Suction available, Patient being monitored and Timeout performed Patient Re-evaluated:Patient Re-evaluated prior to induction Oxygen Delivery Method: Circle system utilized Preoxygenation: Pre-oxygenation with 100% oxygen Induction Type: IV induction Ventilation: Mask ventilation without difficulty Laryngoscope Size: Mac and 3 Grade View: Grade II Tube type: Oral Number of attempts: 1 Airway Equipment and Method: Stylet Placement Confirmation: ETT inserted through vocal cords under direct vision, positive ETCO2 and breath sounds checked- equal and bilateral Secured at: 21 cm Tube secured with: Tape Dental Injury: Teeth and Oropharynx as per pre-operative assessment

## 2022-05-01 NOTE — Transfer of Care (Signed)
Immediate Anesthesia Transfer of Care Note  Patient: Heidi [name]  Procedure(s) Performed: REVERSE SHOULDER ARTHROPLASTY (Right: Shoulder)  Patient Location: PACU  Anesthesia Type:General and Regional  Level of Consciousness: sedated  Airway & Oxygen Therapy: Patient Spontanous Breathing and Patient connected to face mask oxygen  Post-op Assessment: Report given to RN and Post -op Vital signs reviewed and stable  Post vital signs: Reviewed and stable  Last Vitals:  Vitals Value Taken Time  BP 119/69 05/01/22 0853  Temp    Pulse 69 05/01/22 0855  Resp 14 05/01/22 0855  SpO2 99 % 05/01/22 0855  Vitals shown include unvalidated device data.  Last Pain:  Vitals:   05/01/22 0610  TempSrc: Oral         Complications: No notable events documented.

## 2022-05-01 NOTE — Anesthesia Postprocedure Evaluation (Signed)
Anesthesia Post Note  Patient: Heidi [name]  Procedure(s) Performed: REVERSE SHOULDER ARTHROPLASTY (Right: Shoulder)     Patient location during evaluation: PACU Anesthesia Type: General Level of consciousness: awake and alert and oriented Pain management: pain level controlled Vital Signs Assessment: post-procedure vital signs reviewed and stable Respiratory status: spontaneous breathing, nonlabored ventilation and respiratory function stable Cardiovascular status: blood pressure returned to baseline and stable Postop Assessment: no apparent nausea or vomiting Anesthetic complications: no   No notable events documented.  Last Vitals:  Vitals:   05/01/22 0915 05/01/22 0930  BP: (!) 101/57 (!) 125/59  Pulse: 65 67  Resp: 13 14  Temp:  36.5 C  SpO2: 100% 100%    Last Pain:  Vitals:   05/01/22 0930  TempSrc:   PainSc: 0-No pain                 Amel Kitch A.

## 2022-05-01 NOTE — Progress Notes (Signed)
[employer] Rep.Guiddel Fermine interogated device at bedside reported device had 3 more years on battery and is working well.Rep reported device set to pace when heart rate drops below 60 and she has only paced 8% of the time in the past in the [facility]. Rep phone number [phone]. [place] [facility] phone number [phone]. [employer] call [phone]. No further changes noted.

## 2022-05-01 NOTE — Interval H&P Note (Signed)
All questions answered, patient wants to proceed with procedure. ? ?

## 2022-05-01 NOTE — Evaluation (Signed)
Physical Therapy Evaluation Patient Details Name: Heidi [name] MRN: [id number] DOB: 04-27-1941 Today's Date: 05/01/2022  History of Present Illness  Pt is an 81yo female presenting s/p R-RTSA on 05/01/22 after a dislocation on 01/06/22. PMH: afib, CAD, CKd, DM, GERD, HTN, HLD hypothyroidism, OSA on CPAP, pacemaker, periperhal neuropathy.   Clinical Impression  Heidi [name] is a 81 y.o. female POD0 s/p Right reverse total shoulder arthroplasty following a fall and associated dislocation on 01/06/22 with at least four reported falls since that date. Patient reports modified independence using rollator for mobility at baseline. Patient is now limited by functional impairments (see PT problem list below) and requires min guard for bed mobility and for transfers; pt educated on RUE weightbearing status and ability to use RUE during mobility, pt elected to remain in airplane sling and use LUE exclusively. Patient was able to ambulate 30 feet with rollator and min guard level of assist; demonstrated shuffling gait pattern and pt expressed high degree of fear of alling. Patient instructed in exercise to facilitate ROM and circulation to manage edema. Provided incentive spirometer and with Vcs pt able to achieve 759m. Patient will benefit from continued skilled acute PT interventions to address impairments and progress mobility in preparation for safe [facility]        Recommendations for follow up therapy are one component of a multi-disciplinary discharge planning process, led by the attending physician.  Recommendations may be updated based on patient status, additional functional criteria and insurance authorization.  Follow Up Recommendations Follow physician's recommendations for discharge plan and follow up therapies      Assistance Recommended at Discharge Frequent or constant Supervision/Assistance  Patient can return home with the following  A little help with walking  and/or transfers;A little help with bathing/dressing/bathroom;Assistance with cooking/housework;Assist for transportation;Help with stairs or ramp for entrance    Equipment Recommendations None recommended by PT  Recommendations for Other Services       Functional Status Assessment Patient has had a recent decline in their functional status and demonstrates the ability to make significant improvements in function in a reasonable and predictable amount of time.     Precautions / Restrictions Precautions Precautions: Fall Required Braces or Orthoses: Sling (RUE airplane sling) Restrictions Weight Bearing Restrictions: Yes RUE Weight Bearing: Non weight bearing Other Position/Activity Restrictions: PWB when using walker, otherwise NWB      Mobility  Bed Mobility Overal bed mobility: Needs Assistance Bed Mobility: Supine to Sit     Supine to sit: Min guard     General bed mobility comments: For safety only no physical required    Transfers Overall transfer level: Needs assistance Equipment used: Rollator (4 wheels) Transfers: Sit to/from Stand Sit to Stand: From elevated surface, Min guard           General transfer comment: Min guard for safety only, no physical assist required    Ambulation/Gait Ambulation/Gait assistance: Min guard, +2 safety/equipment Gait Distance (Feet): 30 Feet Assistive device: Rollator (4 wheels) Gait Pattern/deviations: Step-to pattern, Decreased step length - right, Decreased step length - left, Decreased dorsiflexion - right, Decreased dorsiflexion - left, Shuffle, Trunk flexed Gait velocity: decreased     General Gait Details: Pt ambulated with rollator and min guard +2 for safety, no physical assist required, LUE on rollator, no overt LOB. Pt ambulated from EOB to toilet and then to recliner. Pt demonstrated strong toe-out sign bilaterally and shuffling gait without noticable DF, reports peripheral neuropathy and  high fear of falling. Pt  declined to utilize RUE on rollator and provided RW with platform attachment but pt declined that device as well.  Stairs            Wheelchair Mobility    Modified Rankin (Stroke Patients Only)       Balance Overall balance assessment: Needs assistance Sitting-balance support: Feet supported, No upper extremity supported Sitting balance-Leahy Scale: Fair     Standing balance support: Reliant on assistive device for balance, During functional activity, Single extremity supported Standing balance-Leahy Scale: Poor                               Pertinent Vitals/Pain Pain Assessment Pain Assessment: No/denies pain    Home Living Family/patient expects to be discharged to:: Assisted living ([facility])     Type of Home: Assisted living Home Access: Level entry;Elevator       Home Layout: One level Home Equipment: Shower seat;Grab bars - tub/shower;Grab bars - toilet;Rollator (4 wheels);Wheelchair - manual      Prior Function Prior Level of Function : Independent/Modified Independent;History of Falls (last six months)             Mobility Comments: Uses rollator at all times. Reported 28 falls in the last two years, with 4 falls since April. ADLs Comments: IND     Hand Dominance   Dominant Hand: Right    Extremity/Trunk Assessment   Upper Extremity Assessment Upper Extremity Assessment: Defer to OT evaluation    Lower Extremity Assessment Lower Extremity Assessment: RLE deficits/detail;LLE deficits/detail RLE Deficits / Details: MMT grossly 4/5 RLE Sensation: history of peripheral neuropathy LLE Deficits / Details: MMT grossly 4/5 LLE Sensation: history of peripheral neuropathy    Cervical / Trunk Assessment Cervical / Trunk Assessment: Kyphotic  Communication   Communication: No difficulties  Cognition Arousal/Alertness: Awake/alert Behavior During Therapy: WFL for tasks assessed/performed Overall Cognitive Status:  Within Functional Limits for tasks assessed                                          General Comments General comments (skin integrity, edema, etc.): Pt reports dizziness with positional changes especially with standing from sitting position that has been bothering her for months    Exercises     Assessment/Plan    PT Assessment Patient needs continued PT services  PT Problem List Decreased strength;Decreased range of motion;Decreased activity tolerance;Decreased balance;Decreased mobility;Decreased coordination;Pain       PT Treatment Interventions DME instruction;Gait training;Functional mobility training;Therapeutic activities;Therapeutic exercise;Balance training;Neuromuscular re-education;Cognitive remediation;Patient/family education;Wheelchair mobility training    PT Goals (Current goals can be found in the Care Plan section)  Acute Rehab PT Goals Patient Stated Goal: To reduce falls PT Goal Formulation: With patient Time For Goal Achievement: 05/15/22 Potential to Achieve Goals: Good    Frequency Min 3X/week     Co-evaluation               AM-PAC PT "6 Clicks" Mobility  Outcome Measure Help needed turning from your back to your side while in a flat bed without using bedrails?: None Help needed moving from lying on your back to sitting on the side of a flat bed without using bedrails?: A Little Help needed moving to and from a bed to a chair (including a wheelchair)?: A Little Help  needed standing up from a chair using your arms (e.g., wheelchair or bedside chair)?: A Little Help needed to walk in hospital room?: A Little Help needed climbing 3-5 steps with a railing? : A Lot 6 Click Score: 18    End of Session Equipment Utilized During Treatment: Gait belt;Other (comment) (RUE sling) Activity Tolerance: Patient tolerated treatment well;No increased pain Patient left: in chair;with call bell/phone within reach;with chair alarm set Nurse  Communication: Mobility status PT Visit Diagnosis: Unsteadiness on feet (R26.81);Repeated falls (R29.6);Muscle weakness (generalized) (M62.81)    Time: 7902-4097 PT Time Calculation (min) (ACUTE ONLY): 33 min   Charges:   PT Evaluation $PT Eval Low Complexity: 1 Low PT Treatments $Gait Training: 8-22 mins       Coolidge Breeze, PT, DPT [facility] Office: [phone] Pager: [phone]  Coolidge Breeze 05/01/2022, 5:36 PM

## 2022-05-01 NOTE — Anesthesia Procedure Notes (Signed)
Anesthesia Regional Block: Interscalene brachial plexus block   Pre-Anesthetic Checklist: , timeout performed,  Correct Patient, Correct Site, Correct Laterality,  Correct Procedure, Correct Position, site marked,  Risks and benefits discussed,  Surgical consent,  Pre-op evaluation,  At surgeon's request and post-op pain management  Laterality: Right  Prep: chloraprep       Needles:  Injection technique: Single-shot  Needle Type: Echogenic Stimulator Needle     Needle Length: 10cm  Needle Gauge: 21   Needle insertion depth: 6 cm   Additional Needles:   Procedures:,,,, ultrasound used (permanent image in chart),,    Narrative:  Start time: 05/01/2022 6:57 AM End time: 05/01/2022 7:02 AM Injection made incrementally with aspirations every 5 mL.  Performed by: Personally  Anesthesiologist: Josephine Igo, MD  Additional Notes: Timeout performed. Patient sedated. Relevant anatomy ID'd using [place]. Incremental 2-83m injection of LA with frequent aspiration. Patient tolerated procedure well.     Right Interscalene Block

## 2022-05-02 ENCOUNTER — Encounter (HOSPITAL_COMMUNITY): Payer: Self-pay | Admitting: Orthopaedic Surgery

## 2022-05-02 ENCOUNTER — Observation Stay (HOSPITAL_COMMUNITY): Payer: Medicare Other

## 2022-05-02 DIAGNOSIS — Z87891 Personal history of nicotine dependence: Secondary | ICD-10-CM | POA: Diagnosis not present

## 2022-05-02 DIAGNOSIS — E039 Hypothyroidism, unspecified: Secondary | ICD-10-CM | POA: Diagnosis not present

## 2022-05-02 DIAGNOSIS — Z7984 Long term (current) use of oral hypoglycemic drugs: Secondary | ICD-10-CM | POA: Diagnosis not present

## 2022-05-02 DIAGNOSIS — Z7982 Long term (current) use of aspirin: Secondary | ICD-10-CM | POA: Diagnosis not present

## 2022-05-02 DIAGNOSIS — Z950 Presence of cardiac pacemaker: Secondary | ICD-10-CM | POA: Diagnosis not present

## 2022-05-02 DIAGNOSIS — Z96611 Presence of right artificial shoulder joint: Secondary | ICD-10-CM | POA: Diagnosis not present

## 2022-05-02 DIAGNOSIS — Z79899 Other long term (current) drug therapy: Secondary | ICD-10-CM | POA: Diagnosis not present

## 2022-05-02 DIAGNOSIS — I2510 Atherosclerotic heart disease of native coronary artery without angina pectoris: Secondary | ICD-10-CM | POA: Diagnosis not present

## 2022-05-02 DIAGNOSIS — N189 Chronic kidney disease, unspecified: Secondary | ICD-10-CM | POA: Diagnosis not present

## 2022-05-02 DIAGNOSIS — E1122 Type 2 diabetes mellitus with diabetic chronic kidney disease: Secondary | ICD-10-CM | POA: Diagnosis not present

## 2022-05-02 DIAGNOSIS — M25311 Other instability, right shoulder: Secondary | ICD-10-CM | POA: Diagnosis not present

## 2022-05-02 DIAGNOSIS — S43005A Unspecified dislocation of left shoulder joint, initial encounter: Secondary | ICD-10-CM | POA: Diagnosis not present

## 2022-05-02 DIAGNOSIS — I129 Hypertensive chronic kidney disease with stage 1 through stage 4 chronic kidney disease, or unspecified chronic kidney disease: Secondary | ICD-10-CM | POA: Diagnosis not present

## 2022-05-02 MED ORDER — ONDANSETRON HCL 4 MG PO TABS: 4.0000 mg | ORAL_TABLET | Freq: Three times a day (TID) | ORAL | 0 refills | Status: AC | PRN

## 2022-05-02 MED ORDER — TRAMADOL HCL 50 MG PO TABS: 50.0000 mg | ORAL_TABLET | Freq: Three times a day (TID) | ORAL | 0 refills | Status: DC | PRN

## 2022-05-02 MED ORDER — MELOXICAM 15 MG PO TABS: 15.0000 mg | ORAL_TABLET | Freq: Every day | ORAL | 0 refills | Status: DC

## 2022-05-02 MED ORDER — HYDROCODONE-ACETAMINOPHEN 10-325 MG PO TABS
1.0000 | ORAL_TABLET | ORAL | Status: DC | PRN
  Administered 2022-05-03: 1 via ORAL
  Filled 2022-05-02: qty 1

## 2022-05-02 MED ORDER — ACETAMINOPHEN 500 MG PO TABS: 1000.0000 mg | ORAL_TABLET | Freq: Three times a day (TID) | ORAL | 0 refills | Status: AC

## 2022-05-02 MED ORDER — HYDROCODONE-ACETAMINOPHEN 5-325 MG PO TABS: 1.0000 | ORAL_TABLET | ORAL | Status: DC | PRN

## 2022-05-02 MED ORDER — TRAMADOL HCL 50 MG PO TABS
50.0000 mg | ORAL_TABLET | Freq: Four times a day (QID) | ORAL | Status: DC
  Administered 2022-05-02 – 2022-05-03 (×4): 50 mg via ORAL
  Filled 2022-05-02 (×4): qty 1

## 2022-05-02 MED ORDER — OXYCODONE HCL 5 MG PO TABS: ORAL_TABLET | ORAL | 0 refills | Status: DC

## 2022-05-02 MED ORDER — ASPIRIN 81 MG PO CHEW: 81.0000 mg | CHEWABLE_TABLET | Freq: Two times a day (BID) | ORAL | 0 refills | Status: AC

## 2022-05-02 MED ORDER — METFORMIN HCL 500 MG PO TABS
500.0000 mg | ORAL_TABLET | Freq: Two times a day (BID) | ORAL | Status: DC
  Administered 2022-05-02 – 2022-05-03 (×3): 500 mg via ORAL
  Filled 2022-05-02 (×3): qty 1

## 2022-05-02 NOTE — Evaluation (Signed)
Occupational Therapy Evaluation Patient Details Name: Heidi [name] MRN: [id number] DOB: 1941/02/27 Today's Date: 05/02/2022   History of Present Illness Pt is an 81yo female presenting s/p R-RTSA on 05/01/22 after a dislocation on 01/06/22. PMH: afib, CAD, CKd, DM, GERD, HTN, HLD hypothyroidism, OSA on CPAP, pacemaker, periperhal neuropathy,   Clinical Impression   Pt is typically mod I in assisted living at [facility] with Rollator. Does all her own bathing/dressing but gets meals from communal dining room. She enjoys playing piano and crocheting. Today she was supervision for bed mobility with HOB elevated, min to mod A for transfers (really benefits from having a surface to stabilize her legs against when coming into standing) Shoulder handout provided and education provided on 1) Sling: wearing schedule, don/doff, positioning 2) Sleeping: wear sling, positioning (has a "husband pillow" she plans on using) 3) Showering: dangle method to access arm pit, safety review 4) Dressing: sequencing, smart clothing choices (Pt has purchased several special shirts for this), sling over shirt 5) ROM/exercises for the hand, wrist, elbow. MD has provided special approval for her to be out of sling and to use RUE with RW for fall prevention. Pt verbalized understanding of all aspects of shoulder education; as did daughter who is Therapist, sports. Pt got fully dressed with assist from OT during session and performed multiple transfers (bed>BSC>bed>recliner, and short in room mobility with Rollator. Encouraged Pt to trial platform walker with PT later today.   Of note: during session Pt with recent medication and Pt stating that L lip was going numb and tingling - then started swelling. RN notified. Pt with increased BP and HR. BEFAST protocol checked with no other positive symptoms. RN provided benadryl and by end of session Pt verbalized relief and OT observed swelling in lip decreased.      Recommendations for follow up therapy are  one component of a multi-disciplinary discharge planning process, led by the attending physician.  Recommendations may be updated based on patient status, additional functional criteria and insurance authorization.   Follow Up Recommendations  Follow physician's recommendations for discharge plan and follow up therapies    Assistance Recommended at Discharge Intermittent Supervision/Assistance  Patient can return home with the following A little help with walking and/or transfers;A lot of help with bathing/dressing/bathroom;Assist for transportation;Help with stairs or ramp for entrance    Functional Status Assessment  Patient has had a recent decline in their functional status and demonstrates the ability to make significant improvements in function in a reasonable and predictable amount of time.  Equipment Recommendations  None recommended by OT (Pt has appropriate DME)    Recommendations for Other Services PT consult     Precautions / Restrictions Precautions Precautions: Fall;Shoulder Type of Shoulder Precautions: conservative Shoulder Interventions: Don joy ultra sling;Off for dressing/bathing/exercises;For comfort (OK to come out of sling for use with RW) Precaution Booklet Issued: Yes (comment) Required Braces or Orthoses: Sling (RUE airplane sling) Restrictions Weight Bearing Restrictions: Yes RUE Weight Bearing: Non weight bearing Other Position/Activity Restrictions: PWB when using walker, otherwise NWB      Mobility Bed Mobility Overal bed mobility: Needs Assistance Bed Mobility: Supine to Sit     Supine to sit: Min guard     General bed mobility comments: For safety only no physical required    Transfers Overall transfer level: Needs assistance Equipment used: Rollator (4 wheels) Transfers: Sit to/from Stand Sit to Stand: From elevated surface, Min guard           General  transfer comment: Min guard for safety only, no physical assist required       Balance Overall balance assessment: Needs assistance Sitting-balance support: Feet supported, No upper extremity supported Sitting balance-Leahy Scale: Fair     Standing balance support: Reliant on assistive device for balance, During functional activity, Single extremity supported Standing balance-Leahy Scale: Poor Standing balance comment: decreased confidence                           ADL either performed or assessed with clinical judgement   ADL Overall ADL's : Needs assistance/impaired Eating/Feeding: Set up   Grooming: Set up   Upper Body Bathing: Moderate assistance   Lower Body Bathing: Moderate assistance   Upper Body Dressing : Maximal assistance;Sitting   Lower Body Dressing: Moderate assistance;Sit to/from stand   Toilet Transfer: Minimal assistance;Stand-pivot;Rollator (4 wheels)   Toileting- Clothing Manipulation and Hygiene: Maximal assistance;Sit to/from stand       Functional mobility during ADLs: Minimal assistance;Rollator (4 wheels);Cueing for safety General ADL Comments: see shoulder section below for empty sections     Vision Baseline Vision/History: 1 Wears glasses Ability to See in Adequate Light: 0 Adequate Patient Visual Report: No change from baseline Vision Assessment?: No apparent visual deficits Additional Comments: did a brief assement, [facility]     Perception     Praxis      Pertinent Vitals/Pain Pain Assessment Pain Assessment: Faces Faces Pain Scale: Hurts little more Pain Location: R shoulder Pain Descriptors / Indicators: Discomfort, Grimacing, Operative site guarding Pain Intervention(s): Premedicated before session, Repositioned, Ice applied     Hand Dominance Right   Extremity/Trunk Assessment Upper Extremity Assessment Upper Extremity Assessment: RUE deficits/detail;Generalized weakness RUE Deficits / Details: post-op deficits as anticipated RUE: Unable to fully assess due to immobilization RUE Sensation:  decreased light touch RUE Coordination: decreased fine motor;decreased gross motor   Lower Extremity Assessment Lower Extremity Assessment: Defer to PT evaluation   Cervical / Trunk Assessment Cervical / Trunk Assessment: Kyphotic   Communication Communication Communication: No difficulties   Cognition Arousal/Alertness: Awake/alert Behavior During Therapy: WFL for tasks assessed/performed Overall Cognitive Status: Within Functional Limits for tasks assessed                                 General Comments: anxious during mobility     General Comments  baseline dizziness with positional changes    Exercises Exercises: Shoulder Shoulder Exercises Elbow Flexion: Right, PROM, Seated Elbow Extension: PROM, Right, Seated Wrist Flexion: AROM, Right Wrist Extension: AROM, Right Digit Composite Flexion: AROM, Right Composite Extension: AROM, Right Neck Flexion: AROM Neck Extension: AROM Neck Lateral Flexion - Right: AROM Neck Lateral Flexion - Left: AROM   Shoulder Instructions Shoulder Instructions Donning/doffing shirt without moving shoulder: Maximal assistance (bought specialized shirts) Method for sponge bathing under operated UE: Minimal assistance;Patient able to independently direct caregiver Donning/doffing sling/immobilizer: Maximal assistance Correct positioning of sling/immobilizer: Maximal assistance Pendulum exercises (written home exercise program):  (N/A) ROM for elbow, wrist and digits of operated UE: Minimal assistance Sling wearing schedule (on at all times/off for ADL's): Supervision/safety Proper positioning of operated UE when showering: Minimal assistance Dressing change:  (n/A) Positioning of UE while sleeping: Minimal assistance    Home Living Family/patient expects to be discharged to:: Assisted living ([facility]) Living Arrangements: Other (Comment)   Type of Home: Assisted living Home Access: Level entry;Elevator  Home Layout: One level     Bathroom Shower/Tub: Occupational psychologist: Handicapped height     Home Equipment: Shower seat;Grab bars - tub/shower;Grab bars - toilet;Rollator (4 wheels);Wheelchair - manual;Hand held shower head          Prior Functioning/Environment Prior Level of Function : Independent/Modified Independent;History of Falls (last six months)             Mobility Comments: Uses rollator at all times. Reported 28 falls in the last two years, with 4 falls since April. ADLs Comments: IND, IADL done by facility        OT Problem List: Decreased strength;Decreased range of motion;Decreased activity tolerance;Impaired balance (sitting and/or standing);Decreased coordination;Decreased safety awareness;Decreased knowledge of precautions;Impaired sensation;Impaired UE functional use;Pain      OT Treatment/Interventions:      OT Goals(Current goals can be found in the care plan section) Acute Rehab OT Goals Patient Stated Goal: get back to playing the piano and crochet OT Goal Formulation: With patient/family Time For Goal Achievement: 05/16/22 Potential to Achieve Goals: Good  OT Frequency:      Co-evaluation              AM-PAC OT "6 Clicks" Daily Activity     Outcome Measure Help from another person eating meals?: A Little Help from another person taking care of personal grooming?: A Little Help from another person toileting, which includes using toliet, bedpan, or urinal?: A Lot Help from another person bathing (including washing, rinsing, drying)?: A Lot Help from another person to put on and taking off regular upper body clothing?: A Lot Help from another person to put on and taking off regular lower body clothing?: A Lot 6 Click Score: 14   End of Session Equipment Utilized During Treatment: Rollator (4 wheels);Other (comment) (don joy sling) Nurse Communication: Mobility status;Precautions;Weight bearing status  Activity Tolerance:  Patient tolerated treatment well Patient left: in chair;with call bell/phone within reach;with chair alarm set;with family/visitor present  OT Visit Diagnosis: Unsteadiness on feet (R26.81);Other abnormalities of gait and mobility (R26.89);Repeated falls (R29.6);Muscle weakness (generalized) (M62.81);History of falling (Z91.81);Pain Pain - Right/Left: Right Pain - part of body: Shoulder                Time: 0973-5329 OT Time Calculation (min): 60 min Charges:  OT General Charges $OT Visit: 1 Visit OT Evaluation $OT Eval Moderate Complexity: 1 Mod OT Treatments $Self Care/Home Management : 8-22 mins Jesse Sans OTR/L Acute Rehabilitation Services Office: [place] 05/02/2022, 12:01 PM

## 2022-05-02 NOTE — Progress Notes (Signed)
Physical Therapy Treatment Patient Details Name: Heidi [name] MRN: [id number] DOB: 20-Aug-1941 Today's Date: 05/02/2022   History of Present Illness Pt is an 81yo female presenting s/p R-RTSA on 05/01/22 after a dislocation on 01/06/22. PMH: afib, CAD, CKd, DM, GERD, HTN, HLD hypothyroidism, OSA on CPAP, pacemaker, periperhal neuropathy,    PT Comments    Pt is POD1 R-reverse TSA.  Pt sitting in recliner upon entry with food tray in front of her but reports inability to eat secondary to high degree of pain and issues with lip numbness (see OT eval note from earlier 7/27). Pt agreeable to mobilize despite pain. Removed RUE sling to attempt PWB with various ADs but pt unable to tolerate its removal secondary to pain, replaced and ensured appropriate adjustment. Attempted various assistive devices for safe ambulation to allow for PWB of RUE including: R platform RW, L hemiwalker, and adjustments to pt's personal rollator; pt did not tolerate any of these and expressed frustration with tears as she is very fearful of falling but does not tolerate sling's removal. For all transfers, pt was min assist for steadying as pt demonstrated posterior lean and utilized recliner surface on BLE for support. Pt expressed desire to ambulate to toilet, used LUE ONLY on rollator with close min guard to ambulate to bathroom door, then +2 hand-held assist into restroom; pt experienced episode of freezing of gait secondary to fear of falling and required max encouragement to enter bathroom, but pt able to void and perform transfers with max assist for donning and doffing pants/underwear while pt maintained balance on grab bar. Upon return to bed, pt min assist for bed mobility and pillow adjustments for comfort of RUE. Pt and pt's daughter expressed concern with discharge today secondary to pain level, pharmacy closing early, daughter cannot stay at [facility] overnight per facility rules, and reduced level of assist at night;  communicated concerns to RN. Pt remains a high fall risk with balance impairments, fear of falling, hx of falling, and dizziness. We will continue to follow acutely to continue gait training to ensure safe discharge to the appropriate destination         Recommendations for follow up therapy are one component of a multi-disciplinary discharge planning process, led by the attending physician.  Recommendations may be updated based on patient status, additional functional criteria and insurance authorization.  Follow Up Recommendations  Follow physician's recommendations for discharge plan and follow up therapies     Assistance Recommended at Discharge Frequent or constant Supervision/Assistance  Patient can return home with the following A little help with walking and/or transfers;A little help with bathing/dressing/bathroom;Assistance with cooking/housework;Assist for transportation;Help with stairs or ramp for entrance   Equipment Recommendations  None recommended by PT    Recommendations for Other Services       Precautions / Restrictions Precautions Precautions: Fall;Shoulder Type of Shoulder Precautions: conservative Shoulder Interventions: Don joy ultra sling;Off for dressing/bathing/exercises;For comfort (OK to come out of sling for use with RW) Precaution Booklet Issued: Yes (comment) Required Braces or Orthoses: Sling (RUE airplane sling) Restrictions Weight Bearing Restrictions: Yes RUE Weight Bearing: Non weight bearing Other Position/Activity Restrictions: PWB when using walker, otherwise NWB     Mobility  Bed Mobility Overal bed mobility: Needs Assistance Bed Mobility: Supine to Sit, Sit to Supine     Supine to sit: Min assist Sit to supine: Min assist   General bed mobility comments: for trunk elevation and BLE movement into bed    Transfers Overall transfer  level: Needs assistance Equipment used: Rollator (4 wheels), Right platform walker,  Hemi-walker Transfers: Sit to/from Stand Sit to Stand: Min assist           General transfer comment: min A for balance, pt demonstrates strong posterior lean in standing and relies heavily on support on back of calves for sit<>stand. Trialed various ADs including R platform walker, L hemi walker, and rollator with R handgrip raised and pt was not able to utilize any of these with safe technique secondary to R shoulder pain (out of sling) or general fear of falling. Max VCs for using LUE to power up    Ambulation/Gait Ambulation/Gait assistance: +2 safety/equipment, Min assist Gait Distance (Feet): 30 Feet Assistive device: Rollator (4 wheels), 2 person hand held assist Gait Pattern/deviations: Step-to pattern, Decreased step length - right, Decreased step length - left, Decreased dorsiflexion - right, Decreased dorsiflexion - left, Shuffle, Trunk flexed, Leaning posteriorly Gait velocity: decreased     General Gait Details: Pt ambulated with rollator and min assist +2 for safety. Demonstrated posterior lean and shuffling gait with high anxiety and fear of falling. When using rollator, pt uses LUE on rollator, no overt LOB. At door to bathroom, switched to using +2 HHA and pt demonstrating freezing of gait and reporting "I can't lift my feet" likely secondary to fear of falling but with max encouragement was able to complete transfer.   Stairs             Wheelchair Mobility    Modified Rankin (Stroke Patients Only)       Balance Overall balance assessment: Needs assistance Sitting-balance support: Feet supported, No upper extremity supported Sitting balance-Leahy Scale: Fair     Standing balance support: Reliant on assistive device for balance, During functional activity, Single extremity supported Standing balance-Leahy Scale: Poor Standing balance comment: decreased confidence, high degree of fear of falling                            Cognition  Arousal/Alertness: Awake/alert Behavior During Therapy: WFL for tasks assessed/performed Overall Cognitive Status: Within Functional Limits for tasks assessed                                 General Comments: anxious during mobility        Exercises      General Comments General comments (skin integrity, edema, etc.): Dizzy with movement at baseline, daughter [name] present for session      Pertinent Vitals/Pain Pain Assessment Pain Assessment: 0-10 Pain Score: 8  Pain Location: R shoulder Pain Descriptors / Indicators: Discomfort, Grimacing, Operative site guarding Pain Intervention(s): Limited activity within patient's tolerance, Monitored during session, Repositioned, Ice applied    Home Living                          Prior Function            PT Goals (current goals can now be found in the care plan section) Acute Rehab PT Goals Patient Stated Goal: To reduce falls PT Goal Formulation: With patient Time For Goal Achievement: 05/15/22 Potential to Achieve Goals: Good Progress towards PT goals: Not progressing toward goals - comment (Pain and fear of falling is limiting OOB mobility)    Frequency    Min 3X/week      PT Plan Current plan remains appropriate  Co-evaluation              AM-PAC PT "6 Clicks" Mobility   Outcome Measure  Help needed turning from your back to your side while in a flat bed without using bedrails?: None Help needed moving from lying on your back to sitting on the side of a flat bed without using bedrails?: A Little Help needed moving to and from a bed to a chair (including a wheelchair)?: A Little Help needed standing up from a chair using your arms (e.g., wheelchair or bedside chair)?: A Little Help needed to walk in hospital room?: A Little Help needed climbing 3-5 steps with a railing? : A Lot 6 Click Score: 18    End of Session Equipment Utilized During Treatment: Gait belt;Other (comment)  (RUE sling) Activity Tolerance: Patient limited by pain;Other (comment) (Anxiety/fear of falling) Patient left: with call bell/phone within reach;in bed;with bed alarm set;with family/visitor present Nurse Communication: Mobility status PT Visit Diagnosis: Unsteadiness on feet (R26.81);Repeated falls (R29.6);Muscle weakness (generalized) (M62.81)     Time: 7169-6789 PT Time Calculation (min) (ACUTE ONLY): 74 min  Charges:  $Gait Training: 23-37 mins $Therapeutic Activity: 23-37 mins $Self Care/Home Management: 8-22                     Coolidge Breeze, PT, DPT [facility] Office: [phone] Pager: [phone]   Coolidge Breeze 05/02/2022, 4:59 PM

## 2022-05-02 NOTE — Plan of Care (Signed)
  Problem: Education: Goal: Knowledge of the prescribed therapeutic regimen will improve Outcome: Progressing   Problem: Activity: Goal: Ability to tolerate increased activity will improve Outcome: Progressing

## 2022-05-02 NOTE — Progress Notes (Signed)
Patient reports mild numbness and tingling to left upper lip. Mild swelling noted. Benadryl administered. Neuro checks normal. Will cont to monitor.

## 2022-05-02 NOTE — Progress Notes (Signed)
   ORTHOPAEDIC PROGRESS NOTE  s/p Procedure(s): REVERSE SHOULDER ARTHROPLASTY  SUBJECTIVE: Reports moderate pain about operative site. Her nerve block wore off last night. She did not sleep well. Pain is better this morning, but still present. No chest pain. No SOB. No nausea/vomiting. No other complaints.  OBJECTIVE: PE: General: resting in hospital bed, NAD Cardiac: regular rate Pulmonary: no increased work of breathing RUE: Dressing CDI and sling well fitting,  full and painless ROM throughout hand with DPC of 0.  Axillary nerve sensation/motor altered in setting of block and unable to be fully tested.  Distal motor and sensory altered in setting of block. Warm well perfused hand.    Vitals:   05/02/22 0123 05/02/22 0441  BP: (!) 116/54 119/60  Pulse: 90 90  Resp: 18 17  Temp: 98.1 F (36.7 C) 97.7 F (36.5 C)  SpO2: 92% 93%     ASSESSMENT: Heidi [name] is a 81 y.o. female doing well postoperatively. POD#1  PLAN: Weightbearing: NWB in sling. Okay to remove sling to use right arm when weightbearing with walker Insicional and dressing care: Reinforce dressings as needed Orthopedic device(s):  Sling Showering: Post-op day #2 with assistance VTE prophylaxis: Aspirin BID Pain control: PRN pain medications Follow - up plan: 1 week in office Contact information:  Weekdays 8-5 Dr. Ophelia Charter, Noemi Chapel PA-C  After hours and holidays please check [email or web address] for group call information for [facility]  Dispo: Plan to discharge to private retirement community [facility] after OT. Patient is agreeable to discharge plan. All questions answered.   Noemi Chapel, PA-C 05/02/2022

## 2022-05-02 NOTE — Progress Notes (Signed)
Patient declines the use of nocturnal CPAP at this time. She agrees to call for assistance if/when she decides to wear it. Equipment remains on standby. RT will continue to follow and encourage use.

## 2022-05-02 NOTE — Progress Notes (Signed)
PT Cancellation Note  Patient Details Name: Heidi [name] MRN: [id number] DOB: 05/02/1941   Cancelled Treatment:    Reason Eval/Treat Not Completed: (P) Patient declined, no reason specified (Pt eating breakfast, requesting return later today. Will follow up as schedule and pt status allows.)  Coolidge Breeze, PT, DPT [facility] Office: [phone] Pager: [phone]  Coolidge Breeze 05/02/2022, 8:58 AM

## 2022-05-03 DIAGNOSIS — M25311 Other instability, right shoulder: Secondary | ICD-10-CM | POA: Diagnosis not present

## 2022-05-03 DIAGNOSIS — N189 Chronic kidney disease, unspecified: Secondary | ICD-10-CM | POA: Diagnosis not present

## 2022-05-03 DIAGNOSIS — Z7984 Long term (current) use of oral hypoglycemic drugs: Secondary | ICD-10-CM | POA: Diagnosis not present

## 2022-05-03 DIAGNOSIS — I129 Hypertensive chronic kidney disease with stage 1 through stage 4 chronic kidney disease, or unspecified chronic kidney disease: Secondary | ICD-10-CM | POA: Diagnosis not present

## 2022-05-03 DIAGNOSIS — Z87891 Personal history of nicotine dependence: Secondary | ICD-10-CM | POA: Diagnosis not present

## 2022-05-03 DIAGNOSIS — Z7982 Long term (current) use of aspirin: Secondary | ICD-10-CM | POA: Diagnosis not present

## 2022-05-03 DIAGNOSIS — E039 Hypothyroidism, unspecified: Secondary | ICD-10-CM | POA: Diagnosis not present

## 2022-05-03 DIAGNOSIS — E1122 Type 2 diabetes mellitus with diabetic chronic kidney disease: Secondary | ICD-10-CM | POA: Diagnosis not present

## 2022-05-03 DIAGNOSIS — Z79899 Other long term (current) drug therapy: Secondary | ICD-10-CM | POA: Diagnosis not present

## 2022-05-03 DIAGNOSIS — R296 Repeated falls: Secondary | ICD-10-CM | POA: Diagnosis not present

## 2022-05-03 DIAGNOSIS — Z950 Presence of cardiac pacemaker: Secondary | ICD-10-CM | POA: Diagnosis not present

## 2022-05-03 DIAGNOSIS — I2510 Atherosclerotic heart disease of native coronary artery without angina pectoris: Secondary | ICD-10-CM | POA: Diagnosis not present

## 2022-05-03 DIAGNOSIS — S43005A Unspecified dislocation of left shoulder joint, initial encounter: Secondary | ICD-10-CM | POA: Diagnosis not present

## 2022-05-03 MED ORDER — METHOCARBAMOL 500 MG PO TABS: 500.0000 mg | ORAL_TABLET | Freq: Three times a day (TID) | ORAL | 0 refills | Status: DC | PRN

## 2022-05-03 NOTE — Progress Notes (Signed)
   ORTHOPAEDIC PROGRESS NOTE  s/p Procedure(s): REVERSE SHOULDER ARTHROPLASTY  SUBJECTIVE: Patient's pain has been better controlled today. She slept better last night. Still having some tingling in her lip but seems to have improved. No chest pain. No SOB. No nausea/vomiting. No other complaints. Voiding well. No bowel movement yet. Tolerating diet.   OBJECTIVE: PE: General: resting in hospital bed, NAD Cardiac: regular rate Pulmonary: no increased work of breathing RUE: Dressing CDI and sling well fitting,  full and painless ROM throughout hand with DPC of 0. + Motor in  AIN, PIN, Ulnar distributions. She states axillary nerve sensation is symmetric to contralateral side. Sensation intact in medial, radial, and ulnar distributions. Well perfused digits.    Vitals:   05/02/22 2137 05/03/22 0502  BP: (!) 129/58 (!) 115/59  Pulse: 85   Resp: 18 18  Temp: 98.9 F (37.2 C) 98 F (36.7 C)  SpO2: 90% 96%   Stable post-op images.   ASSESSMENT: Heidi [name] is a 81 y.o. female doing well postoperatively. POD#2  PLAN: Weightbearing: NWB in sling. Okay to remove sling to use right arm when weightbearing with walker Insicional and dressing care: Reinforce dressings as needed Orthopedic device(s):  Sling Showering: Post-op day #2 with assistance VTE prophylaxis: Aspirin BID Pain control: PRN pain medications Follow - up plan: 1 week in office Contact information:  Weekdays 8-5 Dr. Ophelia Charter, Noemi Chapel PA-C  After hours and holidays please check [email or web address] for group call information for [facility]  Dispo: Plan to discharge to private retirement community - [facility] after OT/PT today. Her pain is better controlled today.    Tingling in lip may be related to oxycodone reaction versus nerve block. We have discontinued her from oxycodone and switched to Tramadol. She has tolerated this well without any reaction. We will discharge on tramadol and robaxin.   Noemi Chapel, PA-C 05/03/2022

## 2022-05-03 NOTE — NC FL2 (Signed)
[place] LEVEL OF CARE SCREENING TOOL     IDENTIFICATION  Patient Name: Heidi [name] Birthdate: [birth year] Sex: female Admission Date (Current Location): 05/01/2022  [facility] and [place] Number:  Herbalist and Address:         Provider Number: [id number]  Attending Physician Name and Address:  No att. providers found  Relative Name and Phone Number:  Loran Senters    Current Level of Care: Hospital Recommended Level of Care: [place] Prior Approval Number:    Date Approved/Denied:   PASRR Number:    Discharge Plan: Other (Comment) (ALF)    Current Diagnoses: Patient Active Problem List   Diagnosis Date Noted   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   HLD (hyperlipidemia) 05/30/2017   GERD (gastroesophageal reflux disease) 05/30/2017   UTI (urinary tract infection) 99/24/2683   Acute metabolic encephalopathy 41/96/2229   Type II diabetes mellitus with renal manifestations ([place]) 05/30/2017   Hypothyroidism 05/30/2017   Depression 05/30/2017   CAD (coronary artery disease) 05/30/2017   CKD (chronic kidney disease), stage III ([place]) 05/30/2017   Chronic diastolic CHF (congestive heart failure) ([place]) 05/30/2017   Dementia ([place]) 05/30/2017   Essential hypertension, benign 06/15/2014   Allergic reaction 04/28/2012   Tachycardia-bradycardia ([place]) 12/24/2011   Pacemaker-mdt 12/24/2011   HTN (hypertension) 12/24/2011   A-fib ([place]) 11/07/2011    Orientation RESPIRATION BLADDER Height & Weight     Self, Time, Situation, Place  Normal Continent Weight: 140 lb (63.5 kg) Height:  5' 1.25" (155.6 cm)  BEHAVIORAL SYMPTOMS/MOOD NEUROLOGICAL BOWEL NUTRITION STATUS      Continent Diet  AMBULATORY STATUS COMMUNICATION OF NEEDS Skin   Supervision Verbally Other (Comment) (surgical incision only)                       Personal Care Assistance Level of Assistance  Bathing, Dressing Bathing Assistance:  Limited assistance   Dressing Assistance: Limited assistance     Functional Limitations Info             [place]  PT (By licensed PT)     PT Frequency: 3x/wk              Contractures Contractures Info: Not present    Additional Factors Info  Code Status, Allergies Code Status Info: Full Allergies Info: Crestor (Rosuvastatin Calcium), Effexor (Venlafaxine Hydrochloride), Adhesive (Tape), Hibiclens (Chlorhexidine Gluconate), Lipitor (Atorvastatin Calcium), Nardil, Penicillins, Plavix (Clopidogrel Bisulfate), Simvastatin, Sulfonamide Derivatives, Zestril (Lisinopril)           Current Medications (05/03/2022):  This is the current hospital active medication list Current Facility-Administered Medications  Medication Dose Route Frequency Provider Last Rate Last Admin   bisacodyl (DULCOLAX) suppository 10 mg  10 mg Rectal Daily PRN McBane, Maylene Roes, PA-C       cholestyramine light (PREVALITE) packet 4 g  4 g Oral Q supper Ethelda Chick, PA-C   4 g at 05/01/22 1613   diphenhydrAMINE (BENADRYL) 12.5 MG/5ML elixir 12.5-25 mg  12.5-25 mg Oral Q4H PRN Ethelda Chick, PA-C   12.5 mg at 05/02/22 1010   docusate sodium (COLACE) capsule 100 mg  100 mg Oral BID Ethelda Chick, PA-C   100 mg at 05/03/22 7989   fenofibrate tablet 160 mg  160 mg Oral Daily Ethelda Chick, PA-C   160 mg at 05/03/22 0941   gabapentin (NEURONTIN) capsule 300 mg  300 mg Oral TID McBane,  Maylene Roes, PA-C   300 mg at 05/03/22 0941   galantamine (RAZADYNE ER) 24 hr capsule 8 mg  8 mg Oral Daily Ethelda Chick, PA-C   8 mg at 05/03/22 0941   HYDROmorphone (DILAUDID) injection 0.25-0.5 mg  0.25-0.5 mg Intravenous Q4H PRN Ethelda Chick, PA-C   0.5 mg at 05/02/22 0005   hydrOXYzine (ATARAX) tablet 25 mg  25 mg Oral QHS Ethelda Chick, PA-C   25 mg at 05/02/22 2042   levothyroxine (SYNTHROID) tablet 75 mcg  75 mcg Oral Q0600 Ethelda Chick, PA-C   75 mcg at  05/03/22 0503   menthol-cetylpyridinium (CEPACOL) lozenge 3 mg  1 lozenge Oral PRN McBane, Maylene Roes, PA-C       Or   phenol (CHLORASEPTIC) mouth spray 1 spray  1 spray Mouth/Throat PRN McBane, Maylene Roes, PA-C       metFORMIN (GLUCOPHAGE) tablet 500 mg  500 mg Oral BID WC McBane, Caroline N, PA-C   500 mg at 05/03/22 0941   methocarbamol (ROBAXIN) tablet 500 mg  500 mg Oral Q6H PRN Ethelda Chick, PA-C   500 mg at 05/03/22 1153   Or   methocarbamol (ROBAXIN) 500 mg in dextrose 5 % 50 mL IVPB  500 mg Intravenous Q6H PRN McBane, Maylene Roes, PA-C       metoCLOPramide (REGLAN) tablet 5-10 mg  5-10 mg Oral Q8H PRN McBane, Maylene Roes, PA-C       Or   metoCLOPramide (REGLAN) injection 5-10 mg  5-10 mg Intravenous Q8H PRN McBane, Maylene Roes, PA-C       metoprolol succinate (TOPROL-XL) 24 hr tablet 100 mg  100 mg Oral Daily Ethelda Chick, PA-C   100 mg at 05/03/22 0941   nortriptyline (PAMELOR) capsule 25 mg  25 mg Oral QHS Ethelda Chick, PA-C   25 mg at 05/02/22 2043   nortriptyline (PAMELOR) capsule 75 mg  75 mg Oral QHS Ethelda Chick, PA-C   75 mg at 05/02/22 2044   ondansetron (ZOFRAN) tablet 4 mg  4 mg Oral Q6H PRN Ethelda Chick, PA-C   4 mg at 05/02/22 2048   Or   ondansetron (ZOFRAN) injection 4 mg  4 mg Intravenous Q6H PRN McBane, Caroline N, PA-C       pantoprazole (PROTONIX) EC tablet 40 mg  40 mg Oral BID Ethelda Chick, PA-C   40 mg at 05/03/22 0941   polyethylene glycol (MIRALAX / GLYCOLAX) packet 17 g  17 g Oral Daily PRN Ethelda Chick, PA-C   17 g at 05/03/22 0940   spironolactone (ALDACTONE) tablet 25 mg  25 mg Oral Daily Ethelda Chick, PA-C   25 mg at 05/03/22 9562   Current Outpatient Medications  Medication Sig Dispense Refill   acetaminophen (TYLENOL) 500 MG tablet Take 2 tablets (1,000 mg total) by mouth every 8 (eight) hours for 14 days. 84 tablet 0   aspirin (ASPIRIN CHILDRENS) 81 MG chewable tablet Chew 1 tablet (81 mg total) by mouth 2  (two) times daily. For 6 weeks for DVT prophylaxis after surgery 84 tablet 0   benazepril (LOTENSIN) 10 MG tablet Take 10 mg by mouth daily.     Cholecalciferol (VITAMIN D-3) 125 MCG (5000 UT) TABS Take 5,000 Units by mouth daily.     cholestyramine (QUESTRAN) 4 g packet Take 4 g by mouth daily.  6   fenofibrate 160 MG tablet Take 160 mg by mouth daily. Take with food  ferrous sulfate 325 (65 FE) MG tablet Take 325 mg by mouth daily.     fexofenadine-pseudoephedrine (ALLEGRA-D) 60-120 MG 12 hr tablet Take 1 tablet by mouth 2 (two) times daily.     fluticasone (FLONASE) 50 MCG/ACT nasal spray Place 1 spray into the nose 2 (two) times daily.     furosemide (LASIX) 20 MG tablet TAKE 1 TABLET BY MOUTH 3 TIMES A WEEK. 45 tablet 3   gabapentin (NEURONTIN) 300 MG capsule Take 300 mg by mouth 3 (three) times daily.     galantamine (RAZADYNE ER) 8 MG 24 hr capsule Take 8 mg by mouth daily.  2   hydrOXYzine (ATARAX/VISTARIL) 25 MG tablet Take 25 mg by mouth at bedtime.     levothyroxine (SYNTHROID, LEVOTHROID) 75 MCG tablet Take 75 mcg by mouth daily.     meloxicam (MOBIC) 15 MG tablet Take 1 tablet (15 mg total) by mouth daily. For 2 weeks for pain and inflammation. Then take as needed 30 tablet 0   metFORMIN (GLUCOPHAGE) 500 MG tablet Take 500 mg by mouth 2 (two) times daily.     methocarbamol (ROBAXIN) 500 MG tablet Take 1 tablet (500 mg total) by mouth every 8 (eight) hours as needed for muscle spasms. 20 tablet 0   metoprolol succinate (TOPROL-XL) 100 MG 24 hr tablet TAKE 1 TABLET(100 MG) BY MOUTH DAILY 90 tablet 3   Multiple Vitamins-Minerals (ADULT GUMMY PO) Take 1 capsule by mouth daily.     nortriptyline (PAMELOR) 25 MG capsule Take 25 mg by mouth at bedtime. Take with a 75 mg capsule for a total dose of 100 mg nightly     nortriptyline (PAMELOR) 75 MG capsule Take 75 mg by mouth at bedtime. Take with a 25 mg capsule for a total dose of 100 mg nightly     ondansetron (ZOFRAN) 4 MG tablet Take 1  tablet (4 mg total) by mouth every 8 (eight) hours as needed for up to 7 days for nausea or vomiting. 10 tablet 0   pantoprazole (PROTONIX) 40 MG tablet Take 40 mg by mouth 2 (two) times daily.     PRESCRIPTION MEDICATION Inhale into the lungs at bedtime. CPAP     sodium chloride (OCEAN) 0.65 % SOLN nasal spray Place 1 spray into both nostrils every 2 (two) hours as needed for congestion.     spironolactone (ALDACTONE) 25 MG tablet Take 1 tablet (25 mg total) by mouth daily. (Patient taking differently: Take 25 mg by mouth at bedtime.) 30 tablet 11   traMADol (ULTRAM) 50 MG tablet Take 1-2 tablets (50-100 mg total) by mouth every 8 (eight) hours as needed for severe pain. 40 tablet 0   benzocaine-menthol (CHLORASEPTIC SORE THROAT) 6-10 MG lozenge Take 1 lozenge by mouth every 2 (two) hours as needed for sore throat.       Discharge Medications: Please see discharge summary for a list of discharge medications.  Relevant Imaging Results:  Relevant Lab Results:   Additional Information SS# [id number]  Lennart Pall, LCSW

## 2022-05-03 NOTE — Progress Notes (Signed)
Patient discharged to home w/ dtr. Given all belongings, instructions, equipment. Verbalized understanding of all instructions. Escorted to pov via w/c.

## 2022-05-03 NOTE — Progress Notes (Signed)
Physical Therapy Treatment Patient Details Name: Heidi [name] MRN: [id number] DOB: [birth year] Today's Date: 05/03/2022   History of Present Illness Pt is an 81yo female presenting s/p R-RTSA on 05/01/22 after a dislocation on 01/06/22. PMH: afib, CAD, CKd, DM, GERD, HTN, HLD hypothyroidism, OSA on CPAP, pacemaker, periperhal neuropathy,    PT Comments    Pt seen POD2, sitting up in recliner with family member present, received at end of OT session, OT assisted with removal of RUE sling for pt comfort. Denied pain. Reinforced NWB in sling and PWB using rollator for ambulation with light touch on handle, pt verbalized understanding and was able to observe precautions appropriately. Pt required min guard for transfers, min assist for ambulation secondary to posterior lean and high fear of falling / anxiety, 17ft in hallway. Pt and pt's daughter comfortable with discharge to ALF,  daughter demonstrated appropriate technique assisting pt to don the RUE sling. Pt has met mobility goals for safe discharge, PT is signing off, thank you for this referral.     Recommendations for follow up therapy are one component of a multi-disciplinary discharge planning process, led by the attending physician.  Recommendations may be updated based on patient status, additional functional criteria and insurance authorization.  Follow Up Recommendations  Follow physician's recommendations for discharge plan and follow up therapies     Assistance Recommended at Discharge Frequent or constant Supervision/Assistance  Patient can return home with the following A little help with walking and/or transfers;A little help with bathing/dressing/bathroom;Assistance with cooking/housework;Assist for transportation;Help with stairs or ramp for entrance   Equipment Recommendations  None recommended by PT    Recommendations for Other Services       Precautions / Restrictions Precautions Precautions: Fall;Shoulder Type of  Shoulder Precautions: conservative Shoulder Interventions: Don joy ultra sling;Off for dressing/bathing/exercises;For comfort (OK to come out of sling for use with RW) Precaution Booklet Issued: Yes (comment) Required Braces or Orthoses: Sling (RUE airplane sling) Restrictions Weight Bearing Restrictions: Yes RUE Weight Bearing: Non weight bearing Other Position/Activity Restrictions: PWB when using walker, otherwise NWB     Mobility  Bed Mobility               General bed mobility comments: Pt in recliner at entry and exit    Transfers Overall transfer level: Needs assistance Equipment used: Rollator (4 wheels) Transfers: Sit to/from Stand Sit to Stand: Min guard           General transfer comment: Min guard for steadying. Cues for hand placement.    Ambulation/Gait Ambulation/Gait assistance: +2 safety/equipment, Min assist Gait Distance (Feet): 30 Feet Assistive device: Rollator (4 wheels) Gait Pattern/deviations: Step-to pattern, Decreased step length - right, Decreased step length - left, Decreased dorsiflexion - right, Decreased dorsiflexion - left, Shuffle, Trunk flexed, Leaning posteriorly Gait velocity: decreased     General Gait Details: Pt ambulated with rollator and min assist for steadying +2 for safety / recliner follow. Demonstrated posterior lean and shuffling gait with high anxiety and fear of falling. during turning, pt had two minor posterior LOB that required min assist to correct.   Stairs             Wheelchair Mobility    Modified Rankin (Stroke Patients Only)       Balance Overall balance assessment: Needs assistance Sitting-balance support: Feet supported, No upper extremity supported Sitting balance-Leahy Scale: Fair     Standing balance support: Reliant on assistive device for balance, During functional activity, Single extremity supported  Standing balance-Leahy Scale: Fair Standing balance comment: decreased confidence,  high degree of fear of falling                            Cognition Arousal/Alertness: Awake/alert Behavior During Therapy: WFL for tasks assessed/performed Overall Cognitive Status: Within Functional Limits for tasks assessed                                          Exercises      General Comments        Pertinent Vitals/Pain Pain Assessment Pain Assessment: No/denies pain Pain Intervention(s): Monitored during session, Repositioned    Home Living                          Prior Function            PT Goals (current goals can now be found in the care plan section) Acute Rehab PT Goals Patient Stated Goal: To reduce falls PT Goal Formulation: With patient Time For Goal Achievement: 05/15/22 Potential to Achieve Goals: Good Progress towards PT goals: Progressing toward goals    Frequency    Min 3X/week      PT Plan Current plan remains appropriate    Co-evaluation              AM-PAC PT "6 Clicks" Mobility   Outcome Measure  Help needed turning from your back to your side while in a flat bed without using bedrails?: None Help needed moving from lying on your back to sitting on the side of a flat bed without using bedrails?: A Little Help needed moving to and from a bed to a chair (including a wheelchair)?: A Little Help needed standing up from a chair using your arms (e.g., wheelchair or bedside chair)?: A Little Help needed to walk in hospital room?: A Little Help needed climbing 3-5 steps with a railing? : A Lot 6 Click Score: 18    End of Session Equipment Utilized During Treatment: Gait belt;Other (comment) (RUE sling) Activity Tolerance: Patient tolerated treatment well;No increased pain;Other (comment) (Anxiety/fear of falling) Patient left: with call bell/phone within reach;with family/visitor present;in chair Nurse Communication: Mobility status PT Visit Diagnosis: Unsteadiness on feet (R26.81);Repeated  falls (R29.6);Muscle weakness (generalized) (M62.81)     Time: 8372-9021 PT Time Calculation (min) (ACUTE ONLY): 26 min  Charges:  $Gait Training: 23-37 mins                     Coolidge Breeze, PT, DPT [facility] Department Office: [phone] Pager: [phone]   Coolidge Breeze 05/03/2022, 1:19 PM

## 2022-05-03 NOTE — Progress Notes (Signed)
  Transition of Care [facility]) Screening Note   Patient Details  Name: Heidi [name] Date of Birth: 1941/01/06   [facility] CM/SW Contact:    Lennart Pall, LCSW Phone Number: 05/03/2022, 8:46 AM    Transition of Care Department [facility]) has reviewed patient and no TOC needs have been identified at this time. We will continue to monitor patient advancement through interdisciplinary progression rounds. If new patient transition needs arise, please place a TOC consult.

## 2022-05-03 NOTE — Progress Notes (Signed)
Occupational Therapy Treatment Patient Details Name: Heidi [name] MRN: [id number] DOB: 1941/09/10 Today's Date: 05/03/2022   History of present illness Pt is an 81yo female presenting s/p R-RTSA on 05/01/22 after a dislocation on 01/06/22. PMH: afib, CAD, CKd, DM, GERD, HTN, HLD hypothyroidism, OSA on CPAP, pacemaker, periperhal neuropathy,   OT comments  OT treatment session with focus on self-care re-education, functional transfers and mobility without RUE sling and PWB on RW in prep for safe return to ALF. Patient requiring supervision A for bed mobility, Min guard for sit to stand transfers and Min guard for stand-pivot transfers with cues for hand placement. Min A overall for clothing management with toileting on BSC. Tolerated mobility without RUE sling and PWB on RW. Denies pain at rest and with activity this date. Daughter present at bedside throughout session. Reports feeling comfortable with providing current level of assist. OT will continue to follow acutely in prep for safe d/c with possible d/c later this date.   Recommendations for follow up therapy are one component of a multi-disciplinary discharge planning process, led by the attending physician.  Recommendations may be updated based on patient status, additional functional criteria and insurance authorization.    Follow Up Recommendations  Follow physician's recommendations for discharge plan and follow up therapies    Assistance Recommended at Discharge Intermittent Supervision/Assistance  Patient can return home with the following  A little help with walking and/or transfers;A lot of help with bathing/dressing/bathroom;Assist for transportation;Help with stairs or ramp for entrance   Equipment Recommendations  None recommended by OT (Pt has appropriate DME)    Recommendations for Other Services      Precautions / Restrictions Precautions Precautions: Fall;Shoulder Type of Shoulder Precautions: conservative Shoulder  Interventions: Don joy ultra sling;Off for dressing/bathing/exercises;For comfort (OK to come out of sling for use with RW) Precaution Booklet Issued: Yes (comment) Required Braces or Orthoses: Sling (RUE airplane sling) Restrictions Weight Bearing Restrictions: Yes RUE Weight Bearing: Non weight bearing Other Position/Activity Restrictions: PWB when using walker, otherwise NWB       Mobility Bed Mobility Overal bed mobility: Needs Assistance Bed Mobility: Supine to Sit     Supine to sit: Supervision, HOB elevated          Transfers Overall transfer level: Needs assistance Equipment used: Rollator (4 wheels) Transfers: Sit to/from Stand, Bed to chair/wheelchair/BSC Sit to Stand: Min guard     Step pivot transfers: Min guard     General transfer comment: Min guard for steadying. Cues for hand placement.     Balance Overall balance assessment: Needs assistance Sitting-balance support: Feet supported, No upper extremity supported Sitting balance-Leahy Scale: Fair     Standing balance support: Reliant on assistive device for balance, During functional activity, Single extremity supported Standing balance-Leahy Scale: Fair                             ADL either performed or assessed with clinical judgement   ADL Overall ADL's : Needs assistance/impaired Eating/Feeding: Set up   Grooming: Set up                   Toilet Transfer: Stand-pivot;Min guard Toilet Transfer Details (indicate cue type and reason): To BSC and recliner with HHA and Min guard. Cues for foot placement and safe hand placement prior to sitting/standing. Toileting- Clothing Manipulation and Hygiene: Sit to/from stand;Minimal assistance Toileting - Clothing Manipulation Details (indicate cue type and reason): 3/3  parts of toileting task with Min A for clothing management. Good use of R hand during task.            Extremity/Trunk Assessment Upper Extremity Assessment RUE  Deficits / Details: post-op deficits as anticipated            Vision       Perception     Praxis      Cognition Arousal/Alertness: Awake/alert Behavior During Therapy: WFL for tasks assessed/performed Overall Cognitive Status: Within Functional Limits for tasks assessed                                          Exercises      Shoulder Instructions       General Comments      Pertinent Vitals/ Pain       Pain Assessment Pain Assessment: No/denies pain Pain Intervention(s): Monitored during session, Premedicated before session  Home Living                                          Prior Functioning/Environment              Frequency           Progress Toward Goals  OT Goals(current goals can now be found in the care plan section)  Progress towards OT goals: Progressing toward goals  Acute Rehab OT Goals Patient Stated Goal: To return home today OT Goal Formulation: With patient/family Time For Goal Achievement: 05/16/22 Potential to Achieve Goals: Good ADL Goals Pt Will Perform Grooming: sitting;with supervision Pt Will Perform Upper Body Dressing: with min assist;sitting Pt Will Perform Lower Body Dressing: sit to/from stand;with supervision Pt Will Transfer to Toilet: with supervision;ambulating Pt Will Perform Toileting - Clothing Manipulation and hygiene: with min assist;sit to/from stand  Plan Discharge plan remains appropriate;Frequency remains appropriate    Co-evaluation                 AM-PAC OT "6 Clicks" Daily Activity     Outcome Measure   Help from another person eating meals?: A Little Help from another person taking care of personal grooming?: A Little Help from another person toileting, which includes using toliet, bedpan, or urinal?: A Little Help from another person bathing (including washing, rinsing, drying)?: A Lot Help from another person to put on and taking off regular upper  body clothing?: A Little Help from another person to put on and taking off regular lower body clothing?: A Lot 6 Click Score: 16    End of Session Equipment Utilized During Treatment: Rollator (4 wheels);Other (comment) (don joy sling)  OT Visit Diagnosis: Unsteadiness on feet (R26.81);Other abnormalities of gait and mobility (R26.89);Repeated falls (R29.6);Muscle weakness (generalized) (M62.81);History of falling (Z91.81);Pain Pain - Right/Left: Right Pain - part of body: Shoulder   Activity Tolerance Patient tolerated treatment well   Patient Left Other (comment);with family/visitor present (Handoff to PT)   Nurse Communication Mobility status;Precautions;Weight bearing status        Time: 2671-2458 OT Time Calculation (min): 19 min  Charges: OT General Charges $OT Visit: 1 Visit OT Treatments $Self Care/Home Management : 8-22 mins  Aquil Duhe H. OTR/L Supplemental OT, Department of rehab services [phone]  Feiga Nadel R H. 05/03/2022, 12:46 PM

## 2022-05-06 DIAGNOSIS — M6281 Muscle weakness (generalized): Secondary | ICD-10-CM | POA: Diagnosis not present

## 2022-05-07 DIAGNOSIS — R296 Repeated falls: Secondary | ICD-10-CM | POA: Diagnosis not present

## 2022-05-07 NOTE — Discharge Summary (Signed)
Patient ID: OTTIE NEGLIA MRN: [id number] DOB/AGE: 03/19/1941 81 y.o.  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  Discharge Diagnoses:  Principal Problem:   Status post reverse total arthroplasty of right shoulder   Past Medical History:  Diagnosis Date   Anxiety    Atrial fibrillation (HCC)    Intermittent   CAD (coronary artery disease)    non obstructive   CKD (chronic kidney disease)    Diabetes mellitus without complication (HCC)    Dysrhythmia    PAF   GERD (gastroesophageal reflux disease)    Glaucoma    Narrow Angle   Heart murmur    HTN (hypertension)    Hyperlipidemia    Hypothyroid    Major depression    10-17-2010: hospitalized for suicidal, [name] (D/C 10-30-2010)   Migraine headache    OSA (obstructive sleep apnea)    CPAP nightly   Peripheral neuropathy    Presence of permanent cardiac pacemaker    Seasonal allergies    Sleep apnea    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  She tolerated procedure well. She was kept for monitoring overnight for pain control, medical monitoring postop, and PT/OT. She struggled with pain control post-operatively. There was concern for discharge secondary to pain level, pharmacy closing early, daughter cannot stay at [facility] per facility rules, and reduced level of assist at night. She also had some swelling and tingling of her lip. There was concern this was caused by oxycodone. She was switched to Tramadol. She tolerated Tramadol better. She was kept an additional midnight. Her pain was improved by post-op day #2.  She did better with PT and OT on post-op day #2. She was found to  be stable [facility] post-op day #2. Patient was instructed on specific activity restrictions and all questions were answered.  Consults: PT/OT  Significant Diagnostic Studies: No additional pertinent studies  Treatments: Surgery  Discharge Exam: General: resting in hospital bed, NAD Cardiac: regular rate Pulmonary: no increased work of breathing RUE: Dressing CDI and sling well fitting,  full and painless ROM throughout hand with DPC of 0. + Motor in  AIN, PIN, Ulnar distributions. She states axillary nerve sensation is symmetric to contralateral side. Sensation intact in medial, radial, and ulnar distributions. Well perfused digits.   Disposition: Discharge disposition: 01-Home or Self Care       Discharge Instructions     Call MD for:  redness, tenderness, or signs of infection (pain, swelling, redness, odor or green/yellow discharge around incision site)   Complete by: As directed    Call MD for:  severe uncontrolled pain   Complete by: As directed    Call MD for:  temperature >100.4   Complete by: As directed    Diet - low sodium heart healthy   Complete by: As directed       Allergies as of 05/03/2022       Reactions   Crestor [rosuvastatin Calcium] Other (See Comments)   Caused Myalgias   Effexor [venlafaxine Hydrochloride] Other (See Comments)   Caused BP to go up   Adhesive [tape] Rash   Hibiclens [chlorhexidine Gluconate] Hives   Lipitor [atorvastatin Calcium] Rash  Nardil Other (See Comments)   Caused BP Problems   Penicillins Swelling   Tolerated Cephalosporin Date: 05/01/22.   Plavix [clopidogrel Bisulfate] Rash   Simvastatin Rash   Sulfonamide Derivatives Rash   Zestril [lisinopril] Rash        Medication List     STOP taking these medications    aspirin EC 81 MG tablet Replaced by: aspirin 81 MG chewable tablet       TAKE these medications    acetaminophen 500 MG tablet Commonly known as: TYLENOL Take  2 tablets (1,000 mg total) by mouth every 8 (eight) hours for 14 days. What changed:  when to take this reasons to take this   ADULT GUMMY PO Take 1 capsule by mouth daily.   aspirin 81 MG chewable tablet Commonly known as: Aspirin Childrens Chew 1 tablet (81 mg total) by mouth 2 (two) times daily. For 6 weeks for DVT prophylaxis after surgery Replaces: aspirin EC 81 MG tablet   benazepril 10 MG tablet Commonly known as: LOTENSIN Take 10 mg by mouth daily.   Chloraseptic Sore Throat 6-10 MG lozenge Generic drug: benzocaine-menthol Take 1 lozenge by mouth every 2 (two) hours as needed for sore throat.   cholestyramine 4 g packet Commonly known as: QUESTRAN Take 4 g by mouth daily.   fenofibrate 160 MG tablet Take 160 mg by mouth daily. Take with food   ferrous sulfate 325 (65 FE) MG tablet Take 325 mg by mouth daily.   fexofenadine-pseudoephedrine 60-120 MG 12 hr tablet Commonly known as: ALLEGRA-D Take 1 tablet by mouth 2 (two) times daily.   fluticasone 50 MCG/ACT nasal spray Commonly known as: FLONASE Place 1 spray into the nose 2 (two) times daily.   furosemide 20 MG tablet Commonly known as: LASIX TAKE 1 TABLET BY MOUTH 3 TIMES A WEEK.   gabapentin 300 MG capsule Commonly known as: NEURONTIN Take 300 mg by mouth 3 (three) times daily.   galantamine 8 MG 24 hr capsule Commonly known as: RAZADYNE ER Take 8 mg by mouth daily.   hydrOXYzine 25 MG tablet Commonly known as: ATARAX Take 25 mg by mouth at bedtime.   levothyroxine 75 MCG tablet Commonly known as: SYNTHROID Take 75 mcg by mouth daily.   meloxicam 15 MG tablet Commonly known as: MOBIC Take 1 tablet (15 mg total) by mouth daily. For 2 weeks for pain and inflammation. Then take as needed   metFORMIN 500 MG tablet Commonly known as: GLUCOPHAGE Take 500 mg by mouth 2 (two) times daily.   methocarbamol 500 MG tablet Commonly known as: ROBAXIN Take 1 tablet (500 mg total) by mouth every 8  (eight) hours as needed for muscle spasms.   metoprolol succinate 100 MG 24 hr tablet Commonly known as: TOPROL-XL TAKE 1 TABLET(100 MG) BY MOUTH DAILY   nortriptyline 75 MG capsule Commonly known as: PAMELOR Take 75 mg by mouth at bedtime. Take with a 25 mg capsule for a total dose of 100 mg nightly   nortriptyline 25 MG capsule Commonly known as: PAMELOR Take 25 mg by mouth at bedtime. Take with a 75 mg capsule for a total dose of 100 mg nightly   ondansetron 4 MG tablet Commonly known as: Zofran Take 1 tablet (4 mg total) by mouth every 8 (eight) hours as needed for up to 7 days for nausea or vomiting.   pantoprazole 40 MG tablet Commonly known as: PROTONIX Take 40 mg by mouth 2 (two) times daily.   PRESCRIPTION  MEDICATION Inhale into the lungs at bedtime. CPAP   sodium chloride 0.65 % Soln nasal spray Commonly known as: OCEAN Place 1 spray into both nostrils every 2 (two) hours as needed for congestion.   spironolactone 25 MG tablet Commonly known as: ALDACTONE Take 1 tablet (25 mg total) by mouth daily. What changed: when to take this   traMADol 50 MG tablet Commonly known as: Ultram Take 1-2 tablets (50-100 mg total) by mouth every 8 (eight) hours as needed for severe pain.   Vitamin D-3 125 MCG (5000 UT) Tabs Take 5,000 Units by mouth daily.        Follow-up Information     Hiram Gash, MD. Go on 05/10/2022.   Specialty: Orthopedic Surgery Why: Your appointment is scheduled for 11:00. Contact information: [address] [place] [address] [phone]         [facility] Follow up.   Why: Your outpatient physical therapy appointment is scheduled for 10:00. Please arrive at 9:45 to complete your paperwork Contact information: [facility] [place] [address] [phone]                 Noemi Chapel, PA-C

## 2022-05-08 DIAGNOSIS — M6281 Muscle weakness (generalized): Secondary | ICD-10-CM | POA: Diagnosis not present

## 2022-05-09 DIAGNOSIS — R296 Repeated falls: Secondary | ICD-10-CM | POA: Diagnosis not present

## 2022-05-10 DIAGNOSIS — M19011 Primary osteoarthritis, right shoulder: Secondary | ICD-10-CM | POA: Diagnosis not present

## 2022-05-13 DIAGNOSIS — M6281 Muscle weakness (generalized): Secondary | ICD-10-CM | POA: Diagnosis not present

## 2022-05-14 DIAGNOSIS — R296 Repeated falls: Secondary | ICD-10-CM | POA: Diagnosis not present

## 2022-05-15 DIAGNOSIS — M6281 Muscle weakness (generalized): Secondary | ICD-10-CM | POA: Diagnosis not present

## 2022-05-16 DIAGNOSIS — R296 Repeated falls: Secondary | ICD-10-CM | POA: Diagnosis not present

## 2022-05-17 DIAGNOSIS — M6281 Muscle weakness (generalized): Secondary | ICD-10-CM | POA: Diagnosis not present

## 2022-05-20 DIAGNOSIS — R296 Repeated falls: Secondary | ICD-10-CM | POA: Diagnosis not present

## 2022-05-21 DIAGNOSIS — M6281 Muscle weakness (generalized): Secondary | ICD-10-CM | POA: Diagnosis not present

## 2022-05-22 DIAGNOSIS — M6281 Muscle weakness (generalized): Secondary | ICD-10-CM | POA: Diagnosis not present

## 2022-05-23 DIAGNOSIS — R296 Repeated falls: Secondary | ICD-10-CM | POA: Diagnosis not present

## 2022-05-24 DIAGNOSIS — M6281 Muscle weakness (generalized): Secondary | ICD-10-CM | POA: Diagnosis not present

## 2022-05-26 ENCOUNTER — Inpatient Hospital Stay (HOSPITAL_BASED_OUTPATIENT_CLINIC_OR_DEPARTMENT_OTHER)
Admission: EM | Admit: 2022-05-26 | Discharge: 2022-05-30 | DRG: 123 | Disposition: A | Discharge status: Skilled Nursing Facility | Payer: Medicare Other | Source: Skilled Nursing Facility | Attending: Internal Medicine | Admitting: Internal Medicine

## 2022-05-26 ENCOUNTER — Other Ambulatory Visit: Payer: Self-pay

## 2022-05-26 ENCOUNTER — Emergency Department (HOSPITAL_BASED_OUTPATIENT_CLINIC_OR_DEPARTMENT_OTHER): Payer: Medicare Other | Admitting: Radiology

## 2022-05-26 ENCOUNTER — Encounter (HOSPITAL_COMMUNITY): Payer: Self-pay

## 2022-05-26 ENCOUNTER — Emergency Department (HOSPITAL_BASED_OUTPATIENT_CLINIC_OR_DEPARTMENT_OTHER): Payer: Medicare Other

## 2022-05-26 ENCOUNTER — Encounter (HOSPITAL_BASED_OUTPATIENT_CLINIC_OR_DEPARTMENT_OTHER): Payer: Self-pay

## 2022-05-26 DIAGNOSIS — Z950 Presence of cardiac pacemaker: Secondary | ICD-10-CM

## 2022-05-26 DIAGNOSIS — Z9181 History of falling: Secondary | ICD-10-CM | POA: Diagnosis not present

## 2022-05-26 DIAGNOSIS — N1831 Chronic kidney disease, stage 3a: Secondary | ICD-10-CM | POA: Diagnosis not present

## 2022-05-26 DIAGNOSIS — R2990 Unspecified symptoms and signs involving the nervous system: Secondary | ICD-10-CM

## 2022-05-26 DIAGNOSIS — G43109 Migraine with aura, not intractable, without status migrainosus: Secondary | ICD-10-CM | POA: Diagnosis not present

## 2022-05-26 DIAGNOSIS — R296 Repeated falls: Secondary | ICD-10-CM | POA: Diagnosis not present

## 2022-05-26 DIAGNOSIS — G459 Transient cerebral ischemic attack, unspecified: Secondary | ICD-10-CM | POA: Insufficient documentation

## 2022-05-26 DIAGNOSIS — I495 Sick sinus syndrome: Secondary | ICD-10-CM | POA: Diagnosis present

## 2022-05-26 DIAGNOSIS — I480 Paroxysmal atrial fibrillation: Secondary | ICD-10-CM | POA: Diagnosis not present

## 2022-05-26 DIAGNOSIS — H409 Unspecified glaucoma: Secondary | ICD-10-CM | POA: Diagnosis present

## 2022-05-26 DIAGNOSIS — Z043 Encounter for examination and observation following other accident: Secondary | ICD-10-CM | POA: Diagnosis not present

## 2022-05-26 DIAGNOSIS — I10 Essential (primary) hypertension: Secondary | ICD-10-CM | POA: Diagnosis present

## 2022-05-26 DIAGNOSIS — Z7401 Bed confinement status: Secondary | ICD-10-CM | POA: Diagnosis not present

## 2022-05-26 DIAGNOSIS — F03A2 Unspecified dementia, mild, with psychotic disturbance: Secondary | ICD-10-CM | POA: Diagnosis present

## 2022-05-26 DIAGNOSIS — N183 Chronic kidney disease, stage 3 unspecified: Secondary | ICD-10-CM | POA: Diagnosis not present

## 2022-05-26 DIAGNOSIS — R270 Ataxia, unspecified: Secondary | ICD-10-CM | POA: Diagnosis not present

## 2022-05-26 DIAGNOSIS — W19XXXA Unspecified fall, initial encounter: Secondary | ICD-10-CM | POA: Diagnosis present

## 2022-05-26 DIAGNOSIS — Z79890 Hormone replacement therapy (postmenopausal): Secondary | ICD-10-CM

## 2022-05-26 DIAGNOSIS — I6523 Occlusion and stenosis of bilateral carotid arteries: Secondary | ICD-10-CM | POA: Diagnosis not present

## 2022-05-26 DIAGNOSIS — I4891 Unspecified atrial fibrillation: Secondary | ICD-10-CM | POA: Diagnosis not present

## 2022-05-26 DIAGNOSIS — R946 Abnormal results of thyroid function studies: Secondary | ICD-10-CM | POA: Diagnosis not present

## 2022-05-26 DIAGNOSIS — E785 Hyperlipidemia, unspecified: Secondary | ICD-10-CM | POA: Diagnosis not present

## 2022-05-26 DIAGNOSIS — R42 Dizziness and giddiness: Secondary | ICD-10-CM | POA: Diagnosis not present

## 2022-05-26 DIAGNOSIS — H532 Diplopia: Secondary | ICD-10-CM | POA: Diagnosis not present

## 2022-05-26 DIAGNOSIS — Z90710 Acquired absence of both cervix and uterus: Secondary | ICD-10-CM

## 2022-05-26 DIAGNOSIS — E1122 Type 2 diabetes mellitus with diabetic chronic kidney disease: Secondary | ICD-10-CM | POA: Diagnosis not present

## 2022-05-26 DIAGNOSIS — Z882 Allergy status to sulfonamides status: Secondary | ICD-10-CM

## 2022-05-26 DIAGNOSIS — N179 Acute kidney failure, unspecified: Secondary | ICD-10-CM | POA: Diagnosis present

## 2022-05-26 DIAGNOSIS — R58 Hemorrhage, not elsewhere classified: Secondary | ICD-10-CM | POA: Diagnosis not present

## 2022-05-26 DIAGNOSIS — J302 Other seasonal allergic rhinitis: Secondary | ICD-10-CM | POA: Diagnosis present

## 2022-05-26 DIAGNOSIS — E1142 Type 2 diabetes mellitus with diabetic polyneuropathy: Secondary | ICD-10-CM | POA: Diagnosis present

## 2022-05-26 DIAGNOSIS — E538 Deficiency of other specified B group vitamins: Secondary | ICD-10-CM | POA: Diagnosis present

## 2022-05-26 DIAGNOSIS — I5032 Chronic diastolic (congestive) heart failure: Secondary | ICD-10-CM | POA: Diagnosis not present

## 2022-05-26 DIAGNOSIS — K219 Gastro-esophageal reflux disease without esophagitis: Secondary | ICD-10-CM | POA: Diagnosis present

## 2022-05-26 DIAGNOSIS — F325 Major depressive disorder, single episode, in full remission: Secondary | ICD-10-CM | POA: Diagnosis present

## 2022-05-26 DIAGNOSIS — Z7984 Long term (current) use of oral hypoglycemic drugs: Secondary | ICD-10-CM

## 2022-05-26 DIAGNOSIS — R269 Unspecified abnormalities of gait and mobility: Secondary | ICD-10-CM | POA: Diagnosis not present

## 2022-05-26 DIAGNOSIS — R404 Transient alteration of awareness: Secondary | ICD-10-CM | POA: Diagnosis not present

## 2022-05-26 DIAGNOSIS — R4189 Other symptoms and signs involving cognitive functions and awareness: Secondary | ICD-10-CM | POA: Diagnosis present

## 2022-05-26 DIAGNOSIS — I2510 Atherosclerotic heart disease of native coronary artery without angina pectoris: Secondary | ICD-10-CM | POA: Diagnosis present

## 2022-05-26 DIAGNOSIS — I130 Hypertensive heart and chronic kidney disease with heart failure and stage 1 through stage 4 chronic kidney disease, or unspecified chronic kidney disease: Secondary | ICD-10-CM | POA: Diagnosis not present

## 2022-05-26 DIAGNOSIS — Z66 Do not resuscitate: Secondary | ICD-10-CM | POA: Diagnosis present

## 2022-05-26 DIAGNOSIS — R2681 Unsteadiness on feet: Secondary | ICD-10-CM | POA: Diagnosis not present

## 2022-05-26 DIAGNOSIS — F03918 Unspecified dementia, unspecified severity, with other behavioral disturbance: Secondary | ICD-10-CM

## 2022-05-26 DIAGNOSIS — I5042 Chronic combined systolic (congestive) and diastolic (congestive) heart failure: Secondary | ICD-10-CM | POA: Diagnosis present

## 2022-05-26 DIAGNOSIS — R569 Unspecified convulsions: Secondary | ICD-10-CM | POA: Diagnosis not present

## 2022-05-26 DIAGNOSIS — F03A18 Unspecified dementia, mild, with other behavioral disturbance: Secondary | ICD-10-CM | POA: Diagnosis present

## 2022-05-26 DIAGNOSIS — Z888 Allergy status to other drugs, medicaments and biological substances status: Secondary | ICD-10-CM

## 2022-05-26 DIAGNOSIS — H5500 Unspecified nystagmus: Secondary | ICD-10-CM | POA: Diagnosis present

## 2022-05-26 DIAGNOSIS — E1129 Type 2 diabetes mellitus with other diabetic kidney complication: Secondary | ICD-10-CM | POA: Diagnosis present

## 2022-05-26 DIAGNOSIS — Z743 Need for continuous supervision: Secondary | ICD-10-CM | POA: Diagnosis not present

## 2022-05-26 DIAGNOSIS — Z96611 Presence of right artificial shoulder joint: Secondary | ICD-10-CM | POA: Diagnosis not present

## 2022-05-26 DIAGNOSIS — Z9109 Other allergy status, other than to drugs and biological substances: Secondary | ICD-10-CM

## 2022-05-26 DIAGNOSIS — R410 Disorientation, unspecified: Secondary | ICD-10-CM | POA: Diagnosis not present

## 2022-05-26 DIAGNOSIS — G4733 Obstructive sleep apnea (adult) (pediatric): Secondary | ICD-10-CM | POA: Diagnosis present

## 2022-05-26 DIAGNOSIS — Z7982 Long term (current) use of aspirin: Secondary | ICD-10-CM

## 2022-05-26 DIAGNOSIS — Z79899 Other long term (current) drug therapy: Secondary | ICD-10-CM

## 2022-05-26 DIAGNOSIS — Z87891 Personal history of nicotine dependence: Secondary | ICD-10-CM

## 2022-05-26 DIAGNOSIS — Z471 Aftercare following joint replacement surgery: Secondary | ICD-10-CM | POA: Diagnosis not present

## 2022-05-26 DIAGNOSIS — R609 Edema, unspecified: Secondary | ICD-10-CM | POA: Diagnosis not present

## 2022-05-26 DIAGNOSIS — E039 Hypothyroidism, unspecified: Secondary | ICD-10-CM | POA: Diagnosis present

## 2022-05-26 DIAGNOSIS — E1121 Type 2 diabetes mellitus with diabetic nephropathy: Secondary | ICD-10-CM | POA: Diagnosis not present

## 2022-05-26 DIAGNOSIS — E0781 Sick-euthyroid syndrome: Secondary | ICD-10-CM | POA: Diagnosis not present

## 2022-05-26 DIAGNOSIS — F419 Anxiety disorder, unspecified: Secondary | ICD-10-CM | POA: Diagnosis present

## 2022-05-26 DIAGNOSIS — H9319 Tinnitus, unspecified ear: Secondary | ICD-10-CM | POA: Diagnosis present

## 2022-05-26 DIAGNOSIS — R011 Cardiac murmur, unspecified: Secondary | ICD-10-CM | POA: Diagnosis present

## 2022-05-26 DIAGNOSIS — Z880 Allergy status to penicillin: Secondary | ICD-10-CM

## 2022-05-26 LAB — BASIC METABOLIC PANEL
Anion gap: 9 (ref 5–15)
BUN: 28 mg/dL — ABNORMAL HIGH (ref 8–23)
CO2: 25 mmol/L (ref 22–32)
Calcium: 10 mg/dL (ref 8.9–10.3)
Chloride: 101 mmol/L (ref 98–111)
Creatinine, Ser: 1.18 mg/dL — ABNORMAL HIGH (ref 0.44–1.00)
GFR, Estimated: 47 mL/min — ABNORMAL LOW (ref >60)
Glucose, Bld: 108 mg/dL — ABNORMAL HIGH (ref 70–99)
Potassium: 4.9 mmol/L (ref 3.5–5.1)
Sodium: 135 mmol/L (ref 135–145)

## 2022-05-26 LAB — URINALYSIS, ROUTINE W REFLEX MICROSCOPIC
Bilirubin Urine: NEGATIVE
Glucose, UA: NEGATIVE mg/dL
Hgb urine dipstick: NEGATIVE
Ketones, ur: NEGATIVE mg/dL
Leukocytes,Ua: NEGATIVE
Nitrite: NEGATIVE
Protein, ur: NEGATIVE mg/dL
Specific Gravity, Urine: 1.011 (ref 1.005–1.030)
pH: 5.5 (ref 5.0–8.0)

## 2022-05-26 LAB — CBC
HCT: 38 % (ref 36.0–46.0)
Hemoglobin: 12.2 g/dL (ref 12.0–15.0)
MCH: 28.7 pg (ref 26.0–34.0)
MCHC: 32.1 g/dL (ref 30.0–36.0)
MCV: 89.4 fL (ref 80.0–100.0)
Platelets: 283 10*3/uL (ref 150–400)
RBC: 4.25 MIL/uL (ref 3.87–5.11)
RDW: 13.4 % (ref 11.5–15.5)
WBC: 7.7 10*3/uL (ref 4.0–10.5)
nRBC: 0 % (ref 0.0–0.2)

## 2022-05-26 LAB — CBG MONITORING, ED: Glucose-Capillary: 96 mg/dL (ref 70–99)

## 2022-05-26 MED ORDER — LORAZEPAM 2 MG/ML IJ SOLN: 0.5000 mg | INTRAMUSCULAR | Status: DC | PRN

## 2022-05-26 MED ORDER — LORAZEPAM 0.5 MG PO TABS: 0.5000 mg | ORAL_TABLET | ORAL | Status: DC | PRN

## 2022-05-26 MED ORDER — LORAZEPAM 2 MG/ML IJ SOLN: 1.0000 mg | INTRAMUSCULAR | Status: DC | PRN

## 2022-05-26 MED ORDER — IOHEXOL 350 MG/ML SOLN
100.0000 mL | Freq: Once | INTRAVENOUS | Status: AC | PRN
Start: 2022-05-26 — End: 2022-05-26
  Administered 2022-05-26: 75 mL via INTRAVENOUS

## 2022-05-26 NOTE — ED Provider Notes (Incomplete)
[facility] EMERGENCY DEPT Provider Note   CSN: [id number] Arrival date & time: 05/26/22  1746     History {Add pertinent medical, surgical, social history, OB history to HPI:1} Chief Complaint  Patient presents with   Fall   Dizziness    Heidi [name] is a 81 y.o. female.   Fall  Dizziness      Home Medications Prior to Admission medications   Medication Sig Start Date End Date Taking? Authorizing Provider  aspirin (ASPIRIN CHILDRENS) 81 MG chewable tablet Chew 1 tablet (81 mg total) by mouth 2 (two) times daily. For 6 weeks for DVT prophylaxis after surgery 05/02/22 06/13/22  Ethelda Chick, PA-C  benazepril (LOTENSIN) 10 MG tablet Take 10 mg by mouth daily.    [provider]  benzocaine-menthol (CHLORASEPTIC SORE THROAT) 6-10 MG lozenge Take 1 lozenge by mouth every 2 (two) hours as needed for sore throat.    [provider]  Cholecalciferol (VITAMIN D-3) 125 MCG (5000 UT) TABS Take 5,000 Units by mouth daily.    [provider]  cholestyramine (QUESTRAN) 4 g packet Take 4 g by mouth daily. 08/13/18   [provider]  fenofibrate 160 MG tablet Take 160 mg by mouth daily. Take with food 12/30/18   [provider]  ferrous sulfate 325 (65 FE) MG tablet Take 325 mg by mouth daily.    [provider]  fexofenadine-pseudoephedrine (ALLEGRA-D) 60-120 MG 12 hr tablet Take 1 tablet by mouth 2 (two) times daily.    [provider]  fluticasone (FLONASE) 50 MCG/ACT nasal spray Place 1 spray into the nose 2 (two) times daily.    [provider]  furosemide (LASIX) 20 MG tablet TAKE 1 TABLET BY MOUTH 3 TIMES A WEEK. 05/07/21   Jerline Pain, MD  gabapentin (NEURONTIN) 300 MG capsule Take 300 mg by mouth 3 (three) times daily.    [provider]  galantamine (RAZADYNE ER) 8 MG 24 hr capsule Take 8 mg by mouth daily. 08/13/18   [provider]  hydrOXYzine (ATARAX/VISTARIL) 25 MG tablet  Take 25 mg by mouth at bedtime.    [provider]  levothyroxine (SYNTHROID, LEVOTHROID) 75 MCG tablet Take 75 mcg by mouth daily.    [provider]  meloxicam (MOBIC) 15 MG tablet Take 1 tablet (15 mg total) by mouth daily. For 2 weeks for pain and inflammation. Then take as needed 05/02/22   McBane, Maylene Roes, PA-C  metFORMIN (GLUCOPHAGE) 500 MG tablet Take 500 mg by mouth 2 (two) times daily.    [provider]  methocarbamol (ROBAXIN) 500 MG tablet Take 1 tablet (500 mg total) by mouth every 8 (eight) hours as needed for muscle spasms. 05/03/22   McBane, Maylene Roes, PA-C  metoprolol succinate (TOPROL-XL) 100 MG 24 hr tablet TAKE 1 TABLET(100 MG) BY MOUTH DAILY 04/25/21   Jerline Pain, MD  Multiple Vitamins-Minerals (ADULT GUMMY PO) Take 1 capsule by mouth daily.    [provider]  nortriptyline (PAMELOR) 25 MG capsule Take 25 mg by mouth at bedtime. Take with a 75 mg capsule for a total dose of 100 mg nightly 12/05/20   [provider]  nortriptyline (PAMELOR) 75 MG capsule Take 75 mg by mouth at bedtime. Take with a 25 mg capsule for a total dose of 100 mg nightly    [provider]  pantoprazole (PROTONIX) 40 MG tablet Take 40 mg by mouth 2 (two) times daily. 06/01/18  [provider]  PRESCRIPTION MEDICATION Inhale into the lungs at bedtime. CPAP    [provider]  sodium chloride (OCEAN) 0.65 % SOLN nasal spray Place 1 spray into both nostrils every 2 (two) hours as needed for congestion.    [provider]  spironolactone (ALDACTONE) 25 MG tablet Take 1 tablet (25 mg total) by mouth daily. Patient taking differently: Take 25 mg by mouth at bedtime. 10/03/17   Deboraha Sprang, MD  traMADol (ULTRAM) 50 MG tablet Take 1-2 tablets (50-100 mg total) by mouth every 8 (eight) hours as needed for severe pain. 05/02/22   McBane, Maylene Roes, PA-C      Allergies    Crestor [rosuvastatin calcium], Effexor [venlafaxine  hydrochloride], Adhesive [tape], Hibiclens [chlorhexidine gluconate], Lipitor [atorvastatin calcium], Nardil, Penicillins, Plavix [clopidogrel bisulfate], Simvastatin, Sulfonamide derivatives, and Zestril [lisinopril]    Review of Systems   Review of Systems  Neurological:  Positive for dizziness.    Physical Exam Updated Vital Signs BP (!) 143/83   Pulse 72   Temp 98.8 F (37.1 C)   Resp 13   Ht 5' 1.25" (1.556 m)   Wt 65.8 kg   SpO2 100%   BMI 27.17 kg/m  Physical Exam  ED Results / Procedures / Treatments   Labs (all labs ordered are listed, but only abnormal results are displayed) Labs Reviewed  BASIC METABOLIC PANEL - Abnormal; Notable for the following components:      Result Value   Glucose, Bld 108 (*)    BUN 28 (*)    Creatinine, Ser 1.18 (*)    GFR, Estimated 47 (*)    All other components within normal limits  CBC  URINALYSIS, ROUTINE W REFLEX MICROSCOPIC  CBG MONITORING, ED    EKG EKG Interpretation  Date/Time:  Sunday May 26 2022 18:22:09 EDT Ventricular Rate:  67 PR Interval:  188 QRS Duration: 100 QT Interval:  356 QTC Calculation: 376 R Axis:   -35 Text Interpretation: Normal sinus rhythm Left axis deviation Moderate voltage criteria for LVH, may be normal variant ( R in aVL , Cornell product ) Abnormal ECG When compared with ECG of 22-Apr-2022 14:31, No significant change was found Confirmed by Regan Lemming ([id number]) on 05/26/2022 6:31:00 PM  Radiology No results found.  Procedures Procedures  {Document cardiac monitor, telemetry assessment procedure when appropriate:1}  Medications Ordered in ED Medications - No data to display  ED Course/ Medical Decision Making/ A&P                           Medical Decision Making Amount and/or Complexity of Data Reviewed Labs: ordered. Radiology: ordered.   ***  {Document critical care time when appropriate:1} {Document review of labs and clinical decision tools ie heart score, Chads2Vasc2  etc:1}  {Document your independent review of radiology images, and any outside records:1} {Document your discussion with family members, caretakers, and with consultants:1} {Document social determinants of health affecting pt's care:1} {Document your decision making why or why not admission, treatments were needed:1} Final Clinical Impression(s) / ED Diagnoses Final diagnoses:  None    Rx / DC Orders ED Discharge Orders     None

## 2022-05-26 NOTE — Progress Notes (Signed)
  [facility] will assume care on arrival to accepting facility. Until arrival, care as per EDP. However, [facility] available 24/7 for questions and assistance.   Nursing staff please page [facility] Admits and Consults ([phone]) as soon as the patient arrives to the hospital.  Dreden Rivere, DO [facility]  

## 2022-05-26 NOTE — ED Notes (Addendum)
Not currently dizzy, has been a few times earlier today which caused her to fall and most of those were to the left. Not on a blood thinner.

## 2022-05-26 NOTE — ED Triage Notes (Signed)
Pt reports multiple falls every day for the last 3 weeks from dizziness. Did not hit head, no LOC.

## 2022-05-26 NOTE — ED Notes (Signed)
Patient transported to CT 

## 2022-05-26 NOTE — Discharge Instructions (Addendum)
Follow with Primary MD Wenda Low, MD /SNF Physician  Get CBC, CMP, checked  by Primary MD next visit.    Activity: As tolerated with Full fall precautions use walker/cane & assistance as needed   Disposition SNNF   Diet: Heart Healthy   On your next visit with your primary care physician please Get Medicines reviewed and adjusted.   Please request your Prim.MD to go over all Hospital Tests and Procedure/Radiological results at the follow up, please get all Hospital records sent to your Prim MD by signing hospital release before you go home.   If you experience worsening of your admission symptoms, develop shortness of breath, life threatening emergency, suicidal or homicidal thoughts you must seek medical attention immediately by calling 911 or calling your MD immediately  if symptoms less severe.  You Must read complete instructions/literature along with all the possible adverse reactions/side effects for all the Medicines you take and that have been prescribed to you. Take any new Medicines after you have completely understood and accpet all the possible adverse reactions/side effects.   Do not drive, operating heavy machinery, perform activities at heights, swimming or participation in water activities or provide baby sitting services if your were admitted for syncope or siezures until you have seen by Primary MD or a Neurologist and advised to do so again.  Do not drive when taking Pain medications.    Do not take more than prescribed Pain, Sleep and Anxiety Medications  Special Instructions: If you have smoked or chewed Tobacco  in the last 2 yrs please stop smoking, stop any regular Alcohol  and or any Recreational drug use.  Wear Seat belts while driving.   Please note  You were cared for by a hospitalist during your hospital stay. If you have any questions about your discharge medications or the care you received while you were in the hospital after you are  discharged, you can call the unit and asked to speak with the hospitalist on call if the hospitalist that took care of you is not available. Once you are discharged, your primary care physician will handle any further medical issues. Please note that NO REFILLS for any discharge medications will be authorized once you are discharged, as it is imperative that you return to your primary care physician (or establish a relationship with a primary care physician if you do not have one) for your aftercare needs so that they can reassess your need for medications and monitor your lab values.

## 2022-05-27 ENCOUNTER — Encounter (HOSPITAL_COMMUNITY): Payer: Self-pay | Admitting: Internal Medicine

## 2022-05-27 DIAGNOSIS — E0781 Sick-euthyroid syndrome: Secondary | ICD-10-CM | POA: Diagnosis not present

## 2022-05-27 DIAGNOSIS — I5042 Chronic combined systolic (congestive) and diastolic (congestive) heart failure: Secondary | ICD-10-CM | POA: Diagnosis not present

## 2022-05-27 DIAGNOSIS — R4189 Other symptoms and signs involving cognitive functions and awareness: Secondary | ICD-10-CM | POA: Diagnosis not present

## 2022-05-27 DIAGNOSIS — I6523 Occlusion and stenosis of bilateral carotid arteries: Secondary | ICD-10-CM | POA: Diagnosis not present

## 2022-05-27 DIAGNOSIS — G459 Transient cerebral ischemic attack, unspecified: Secondary | ICD-10-CM | POA: Diagnosis not present

## 2022-05-27 DIAGNOSIS — I5032 Chronic diastolic (congestive) heart failure: Secondary | ICD-10-CM | POA: Diagnosis not present

## 2022-05-27 DIAGNOSIS — Z66 Do not resuscitate: Secondary | ICD-10-CM | POA: Diagnosis not present

## 2022-05-27 DIAGNOSIS — N1831 Chronic kidney disease, stage 3a: Secondary | ICD-10-CM | POA: Diagnosis not present

## 2022-05-27 DIAGNOSIS — G43109 Migraine with aura, not intractable, without status migrainosus: Secondary | ICD-10-CM | POA: Diagnosis not present

## 2022-05-27 DIAGNOSIS — R946 Abnormal results of thyroid function studies: Secondary | ICD-10-CM | POA: Diagnosis not present

## 2022-05-27 DIAGNOSIS — E785 Hyperlipidemia, unspecified: Secondary | ICD-10-CM | POA: Diagnosis not present

## 2022-05-27 DIAGNOSIS — F03A2 Unspecified dementia, mild, with psychotic disturbance: Secondary | ICD-10-CM | POA: Diagnosis not present

## 2022-05-27 DIAGNOSIS — E039 Hypothyroidism, unspecified: Secondary | ICD-10-CM

## 2022-05-27 DIAGNOSIS — F325 Major depressive disorder, single episode, in full remission: Secondary | ICD-10-CM | POA: Diagnosis not present

## 2022-05-27 DIAGNOSIS — H532 Diplopia: Secondary | ICD-10-CM

## 2022-05-27 DIAGNOSIS — W19XXXA Unspecified fall, initial encounter: Secondary | ICD-10-CM | POA: Diagnosis present

## 2022-05-27 DIAGNOSIS — E1142 Type 2 diabetes mellitus with diabetic polyneuropathy: Secondary | ICD-10-CM | POA: Diagnosis not present

## 2022-05-27 DIAGNOSIS — I10 Essential (primary) hypertension: Secondary | ICD-10-CM

## 2022-05-27 DIAGNOSIS — R2681 Unsteadiness on feet: Secondary | ICD-10-CM

## 2022-05-27 DIAGNOSIS — E1121 Type 2 diabetes mellitus with diabetic nephropathy: Secondary | ICD-10-CM

## 2022-05-27 DIAGNOSIS — N179 Acute kidney failure, unspecified: Secondary | ICD-10-CM | POA: Diagnosis not present

## 2022-05-27 DIAGNOSIS — Z950 Presence of cardiac pacemaker: Secondary | ICD-10-CM | POA: Diagnosis not present

## 2022-05-27 DIAGNOSIS — Z9181 History of falling: Secondary | ICD-10-CM | POA: Diagnosis not present

## 2022-05-27 DIAGNOSIS — I480 Paroxysmal atrial fibrillation: Secondary | ICD-10-CM | POA: Diagnosis not present

## 2022-05-27 DIAGNOSIS — E538 Deficiency of other specified B group vitamins: Secondary | ICD-10-CM | POA: Diagnosis not present

## 2022-05-27 DIAGNOSIS — I495 Sick sinus syndrome: Secondary | ICD-10-CM | POA: Diagnosis not present

## 2022-05-27 DIAGNOSIS — E1122 Type 2 diabetes mellitus with diabetic chronic kidney disease: Secondary | ICD-10-CM | POA: Diagnosis not present

## 2022-05-27 DIAGNOSIS — I130 Hypertensive heart and chronic kidney disease with heart failure and stage 1 through stage 4 chronic kidney disease, or unspecified chronic kidney disease: Secondary | ICD-10-CM | POA: Diagnosis not present

## 2022-05-27 DIAGNOSIS — F03A18 Unspecified dementia, mild, with other behavioral disturbance: Secondary | ICD-10-CM | POA: Diagnosis not present

## 2022-05-27 LAB — COMPREHENSIVE METABOLIC PANEL
ALT: 18 U/L (ref 0–44)
AST: 18 U/L (ref 15–41)
Albumin: 3.6 g/dL (ref 3.5–5.0)
Alkaline Phosphatase: 93 U/L (ref 38–126)
Anion gap: 6 (ref 5–15)
BUN: 18 mg/dL (ref 8–23)
CO2: 24 mmol/L (ref 22–32)
Calcium: 9.9 mg/dL (ref 8.9–10.3)
Chloride: 107 mmol/L (ref 98–111)
Creatinine, Ser: 1.02 mg/dL — ABNORMAL HIGH (ref 0.44–1.00)
GFR, Estimated: 55 mL/min — ABNORMAL LOW (ref >60)
Glucose, Bld: 123 mg/dL — ABNORMAL HIGH (ref 70–99)
Potassium: 4.9 mmol/L (ref 3.5–5.1)
Sodium: 137 mmol/L (ref 135–145)
Total Bilirubin: 0.5 mg/dL (ref 0.3–1.2)
Total Protein: 6.3 g/dL — ABNORMAL LOW (ref 6.5–8.1)

## 2022-05-27 LAB — MAGNESIUM: Magnesium: 1.6 mg/dL — ABNORMAL LOW (ref 1.7–2.4)

## 2022-05-27 LAB — LIPID PANEL
Cholesterol: 206 mg/dL — ABNORMAL HIGH (ref 0–200)
HDL: 60 mg/dL (ref >40)
LDL Cholesterol: 112 mg/dL — ABNORMAL HIGH (ref 0–99)
Total CHOL/HDL Ratio: 3.4 RATIO
Triglycerides: 171 mg/dL — ABNORMAL HIGH (ref <150)
VLDL: 34 mg/dL (ref 0–40)

## 2022-05-27 LAB — GLUCOSE, CAPILLARY: Glucose-Capillary: 106 mg/dL — ABNORMAL HIGH (ref 70–99)

## 2022-05-27 LAB — CBC WITH DIFFERENTIAL/PLATELET
Abs Immature Granulocytes: 0.03 10*3/uL (ref 0.00–0.07)
Basophils Absolute: 0 10*3/uL (ref 0.0–0.1)
Basophils Relative: 1 %
Eosinophils Absolute: 0.8 10*3/uL — ABNORMAL HIGH (ref 0.0–0.5)
Eosinophils Relative: 10 %
HCT: 36.4 % (ref 36.0–46.0)
Hemoglobin: 11.8 g/dL — ABNORMAL LOW (ref 12.0–15.0)
Immature Granulocytes: 0 %
Lymphocytes Relative: 37 %
Lymphs Abs: 3 10*3/uL (ref 0.7–4.0)
MCH: 28.4 pg (ref 26.0–34.0)
MCHC: 32.4 g/dL (ref 30.0–36.0)
MCV: 87.5 fL (ref 80.0–100.0)
Monocytes Absolute: 0.5 10*3/uL (ref 0.1–1.0)
Monocytes Relative: 6 %
Neutro Abs: 3.7 10*3/uL (ref 1.7–7.7)
Neutrophils Relative %: 46 %
Platelets: 267 10*3/uL (ref 150–400)
RBC: 4.16 MIL/uL (ref 3.87–5.11)
RDW: 13.2 % (ref 11.5–15.5)
WBC: 8 10*3/uL (ref 4.0–10.5)
nRBC: 0 % (ref 0.0–0.2)

## 2022-05-27 LAB — PROTIME-INR
INR: 1 (ref 0.8–1.2)
Prothrombin Time: 13.1 seconds (ref 11.4–15.2)

## 2022-05-27 LAB — TSH: TSH: 5.134 u[IU]/mL — ABNORMAL HIGH (ref 0.350–4.500)

## 2022-05-27 LAB — CK: Total CK: 29 U/L — ABNORMAL LOW (ref 38–234)

## 2022-05-27 LAB — PHOSPHORUS: Phosphorus: 2.9 mg/dL (ref 2.5–4.6)

## 2022-05-27 LAB — CBG MONITORING, ED: Glucose-Capillary: 112 mg/dL — ABNORMAL HIGH (ref 70–99)

## 2022-05-27 MED ORDER — FENOFIBRATE 160 MG PO TABS
160.0000 mg | ORAL_TABLET | Freq: Every day | ORAL | Status: DC
  Administered 2022-05-28 – 2022-05-30 (×3): 160 mg via ORAL
  Filled 2022-05-27 (×4): qty 1

## 2022-05-27 MED ORDER — SODIUM CHLORIDE 0.9 % IV SOLN: INTRAVENOUS | Status: DC

## 2022-05-27 MED ORDER — NORTRIPTYLINE HCL 25 MG PO CAPS: 25.0000 mg | ORAL_CAPSULE | Freq: Every day | ORAL | Status: DC

## 2022-05-27 MED ORDER — BENAZEPRIL HCL 5 MG PO TABS
10.0000 mg | ORAL_TABLET | Freq: Every day | ORAL | Status: DC
  Filled 2022-05-27: qty 1

## 2022-05-27 MED ORDER — GABAPENTIN 300 MG PO CAPS
300.0000 mg | ORAL_CAPSULE | Freq: Three times a day (TID) | ORAL | Status: DC
  Administered 2022-05-27 – 2022-05-30 (×10): 300 mg via ORAL
  Filled 2022-05-27 (×10): qty 1

## 2022-05-27 MED ORDER — HYDROXYZINE HCL 25 MG PO TABS
25.0000 mg | ORAL_TABLET | Freq: Two times a day (BID) | ORAL | Status: DC | PRN
Start: 2022-05-27 — End: 2022-05-30
  Filled 2022-05-27: qty 1

## 2022-05-27 MED ORDER — ASPIRIN 81 MG PO CHEW
81.0000 mg | CHEWABLE_TABLET | Freq: Two times a day (BID) | ORAL | Status: DC
  Administered 2022-05-27 – 2022-05-30 (×8): 81 mg via ORAL
  Filled 2022-05-27 (×8): qty 1

## 2022-05-27 MED ORDER — LEVOTHYROXINE SODIUM 75 MCG PO TABS
75.0000 ug | ORAL_TABLET | Freq: Every day | ORAL | Status: DC
  Administered 2022-05-27 – 2022-05-30 (×3): 75 ug via ORAL
  Filled 2022-05-27: qty 3
  Filled 2022-05-27 (×4): qty 1

## 2022-05-27 MED ORDER — NORTRIPTYLINE HCL 25 MG PO CAPS: 75.0000 mg | ORAL_CAPSULE | Freq: Every day | ORAL | Status: DC

## 2022-05-27 MED ORDER — POLYETHYLENE GLYCOL 3350 17 G PO PACK: 17.0000 g | PACK | Freq: Every day | ORAL | Status: DC | PRN

## 2022-05-27 MED ORDER — TRAMADOL HCL 50 MG PO TABS
50.0000 mg | ORAL_TABLET | Freq: Three times a day (TID) | ORAL | Status: DC | PRN
  Administered 2022-05-27: 50 mg via ORAL
  Filled 2022-05-27: qty 1

## 2022-05-27 MED ORDER — NORTRIPTYLINE HCL 25 MG PO CAPS
100.0000 mg | ORAL_CAPSULE | Freq: Every day | ORAL | Status: DC
  Administered 2022-05-27 – 2022-05-29 (×3): 100 mg via ORAL
  Filled 2022-05-27 (×4): qty 4

## 2022-05-27 MED ORDER — HYDROCODONE-ACETAMINOPHEN 5-325 MG PO TABS: 1.0000 | ORAL_TABLET | ORAL | Status: DC | PRN

## 2022-05-27 MED ORDER — METFORMIN HCL 500 MG PO TABS: 500.0000 mg | ORAL_TABLET | Freq: Two times a day (BID) | ORAL | Status: DC

## 2022-05-27 MED ORDER — HYDROXYZINE HCL 25 MG PO TABS
25.0000 mg | ORAL_TABLET | Freq: Every day | ORAL | Status: DC
  Administered 2022-05-27: 25 mg via ORAL
  Filled 2022-05-27: qty 1

## 2022-05-27 MED ORDER — SENNA 8.6 MG PO TABS
1.0000 | ORAL_TABLET | Freq: Two times a day (BID) | ORAL | Status: DC
  Administered 2022-05-27 – 2022-05-30 (×5): 8.6 mg via ORAL
  Filled 2022-05-27 (×6): qty 1

## 2022-05-27 MED ORDER — METOPROLOL SUCCINATE ER 100 MG PO TB24
100.0000 mg | ORAL_TABLET | Freq: Every day | ORAL | Status: DC
  Administered 2022-05-27 – 2022-05-30 (×4): 100 mg via ORAL
  Filled 2022-05-27: qty 4
  Filled 2022-05-27 (×3): qty 1

## 2022-05-27 MED ORDER — METHOCARBAMOL 500 MG PO TABS
500.0000 mg | ORAL_TABLET | Freq: Three times a day (TID) | ORAL | Status: DC | PRN
Start: 2022-05-27 — End: 2022-05-30

## 2022-05-27 MED ORDER — GALANTAMINE HYDROBROMIDE ER 8 MG PO CP24
8.0000 mg | ORAL_CAPSULE | Freq: Every day | ORAL | Status: DC
  Administered 2022-05-28 – 2022-05-30 (×3): 8 mg via ORAL
  Filled 2022-05-27 (×4): qty 1

## 2022-05-27 MED ORDER — INSULIN ASPART 100 UNIT/ML IJ SOLN
0.0000 [IU] | Freq: Three times a day (TID) | INTRAMUSCULAR | Status: DC
  Administered 2022-05-28 – 2022-05-29 (×3): 1 [IU] via SUBCUTANEOUS
  Administered 2022-05-29: 2 [IU] via SUBCUTANEOUS
  Administered 2022-05-30: 1 [IU] via SUBCUTANEOUS

## 2022-05-27 MED ORDER — LEVOTHYROXINE SODIUM 25 MCG PO TABS: 75.0000 ug | ORAL_TABLET | Freq: Every day | ORAL | Status: DC

## 2022-05-27 MED ORDER — SPIRONOLACTONE 25 MG PO TABS
25.0000 mg | ORAL_TABLET | Freq: Every day | ORAL | Status: DC
Start: 2022-05-27 — End: 2022-05-27
  Administered 2022-05-27: 25 mg via ORAL
  Filled 2022-05-27: qty 1

## 2022-05-27 MED ORDER — ACETAMINOPHEN 325 MG PO TABS
650.0000 mg | ORAL_TABLET | Freq: Four times a day (QID) | ORAL | Status: DC | PRN
  Administered 2022-05-28: 650 mg via ORAL
  Filled 2022-05-27: qty 2

## 2022-05-27 MED ORDER — INSULIN ASPART 100 UNIT/ML IJ SOLN: 0.0000 [IU] | INTRAMUSCULAR | Status: DC

## 2022-05-27 MED ORDER — ACETAMINOPHEN 650 MG RE SUPP: 650.0000 mg | Freq: Four times a day (QID) | RECTAL | Status: DC | PRN

## 2022-05-27 NOTE — Assessment & Plan Note (Signed)
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  . 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  neurology is aware PT OT assement  check orthostatics  pt states she was told she cannot get MRI due to pacemaker Repeat ECHO

## 2022-05-27 NOTE — Assessment & Plan Note (Signed)
Will need pT OT eval prior to dc Avoid strong narcotics and sedating meds

## 2022-05-27 NOTE — Assessment & Plan Note (Signed)
Order SSI  hold metformin

## 2022-05-27 NOTE — ED Notes (Signed)
Handoff report given to [employer] on 5W at [facility]

## 2022-05-27 NOTE — Assessment & Plan Note (Signed)
S/p pacemaker

## 2022-05-27 NOTE — ED Notes (Signed)
Handoff report given to [facility] 

## 2022-05-27 NOTE — Assessment & Plan Note (Signed)
Last echo 5 m ago shows preserved EF Given possible neurological event will repeat

## 2022-05-27 NOTE — Assessment & Plan Note (Signed)
Cont Toprol pt not on AC due to falls

## 2022-05-27 NOTE — Assessment & Plan Note (Signed)
-   Check TSH continue home medications synthroid at 75 mcg a day at current dose

## 2022-05-27 NOTE — H&P (Signed)
Heidi [name] IPJ:[id number] DOB: 1940-12-22 DOA: 05/26/2022     PCP: Wenda Low, MD   Outpatient Specialists:  CARDS:  Dr. Marlou Porch, Dr. Caryl Comes    Patient arrived to ER on 05/26/22 at 1746 Referred by Attending Toy Baker, MD   Patient coming from:     From facility  [facility]  Chief Complaint:   Chief Complaint  Patient presents with   Fall   Dizziness    HPI: Heidi [name] is a 81 y.o. female with medical history significant of HTN, HLD, depression, GERD, glaucoma, atrial fibrillation not on anticoagulation, CAD, CKD, OSA    Tachy-bradycardia - sp pacemaker, mild dementia  Presented with  falls diplopia Came in w multiple falls over past 3 wks and diplopia Had a Shoulder surgery on  05/01/2022  At baseline walks with a walker but has gotten worse over past 3 wks Had diplopia in the past but has gotten worse Now lives at [facility] Was ambulating with a walker when fell No LOC  The patient has been worked up outpatient for ocular myasthenia gravis which was reportedly negative.    reports she gets episodes of dizzy ( everything looks jumble) if lasts too long she can get nauseous  This comes and goes can last from seconds to all day  Not sure what makes it worse  No localized weakness  She does fall to the left more No slurred speech  Does not smoke now no EtOH  No fever, no chills no nausea Pt has hx of diarrhea but improved with cholestyramine Reports diplopia episodes are chronic not constant sometimes exacerbated by looking out of the corner of her eye  Regarding pertinent Chronic problems:     Hyperlipidemia -  does not tolerate statin Lipid Panel     Component Value Date/Time   CHOL 188 04/02/2011 0905   TRIG 85.0 04/02/2011 0905   HDL 58.40 04/02/2011 0905   CHOLHDL 3 04/02/2011 0905   VLDL 17.0 04/02/2011 0905   LDLCALC 113 (H) 04/02/2011 0905     HTN on Toprol, aldactone   chronic CHF diastolic/systolic/ combined - last echo 12/2021 EF  60-65%     DM 2 -  Lab Results  Component Value Date   HGBA1C 6.5 (H) 04/22/2022    PO meds only,     Hypothyroidism:  Lab Results  Component Value Date   TSH 2.57 06/18/2016   on synthroid     OSA -on nocturnal CPAP,      A. Fib -  - CHA2DS2 vas score   9       Not on anticoagulation secondary to Risk of Falls,           -  Rate control:  Currently controlled with  Toprolol,       While in ER:   CTA head and neck showed  Moderate narrowing in the  proximal right P2, mild-to-moderate stenosis in the bilateral  cavernous and supraclinoid ICAs, and mild multifocal narrowing in  the left-greater-than-right PCA.   60% stenosis in the proximal right ICA, with less than 50%  stenosis in the proximal left ICA and proximal left subclavian  artery    Following Medications were ordered in ER: Medications  LORazepam (ATIVAN) tablet 0.5 mg (has no administration in time range)  LORazepam (ATIVAN) injection 0.5 mg (has no administration in time range)  LORazepam (ATIVAN) injection 1 mg (has no administration in time range)  aspirin chewable tablet 81 mg (81  mg Oral Given 05/27/22 1027)  benazepril (LOTENSIN) tablet 10 mg (has no administration in time range)  fenofibrate tablet 160 mg (has no administration in time range)  gabapentin (NEURONTIN) capsule 300 mg (300 mg Oral Given 05/27/22 1756)  galantamine (RAZADYNE ER) 24 hr capsule 8 mg (has no administration in time range)  hydrOXYzine (ATARAX) tablet 25 mg (25 mg Oral Given 05/27/22 9242)  methocarbamol (ROBAXIN) tablet 500 mg (has no administration in time range)  metoprolol succinate (TOPROL-XL) 24 hr tablet 100 mg (100 mg Oral Given 05/27/22 1027)  spironolactone (ALDACTONE) tablet 25 mg (25 mg Oral Given 05/27/22 6834)  traMADol (ULTRAM) tablet 50-100 mg (50 mg Oral Given 05/27/22 1203)  levothyroxine (SYNTHROID) tablet 75 mcg (75 mcg Oral Given 05/27/22 0541)  metFORMIN (GLUCOPHAGE) tablet 500 mg (has no administration in time  range)  nortriptyline (PAMELOR) capsule 100 mg (has no administration in time range)  iohexol (OMNIPAQUE) 350 MG/ML injection 100 mL (75 mLs Intravenous Contrast Given 05/26/22 2037)    _______________________________________________________ ER Provider Called:  Neurolgy    Dr.Dr. Curly Shores They Recommend admit to medicine   recommended MRI of the brain without contrast and in addition to MRI of the cervical spine.  Of note, the patient does have a Medtronic permanent pacemaker and that will need to be coordinated in order to undergo MRI imaging.  Will see on arrival    ED Triage Vitals  Enc Vitals Group     BP 05/26/22 1818 (!) 120/45     Pulse Rate 05/26/22 1818 80     Resp 05/26/22 1818 18     Temp 05/26/22 1818 98.8 F (37.1 C)     Temp Source 05/27/22 0552 Oral     SpO2 05/26/22 1818 97 %     Weight 05/26/22 1818 145 lb (65.8 kg)     Height 05/26/22 1818 5' 1.25" (1.556 m)     Head Circumference --      Peak Flow --      Pain Score 05/26/22 1818 1     Pain Loc --      Pain Edu? --      Excl. in [place]? --   TMAX(24)@     _________________________________________ Significant initial  Findings: Abnormal Labs Reviewed  BASIC METABOLIC PANEL - Abnormal; Notable for the following components:      Result Value   Glucose, Bld 108 (*)    BUN 28 (*)    Creatinine, Ser 1.18 (*)    GFR, Estimated 47 (*)    All other components within normal limits  URINALYSIS, ROUTINE W REFLEX MICROSCOPIC - Abnormal; Notable for the following components:   Color, Urine COLORLESS (*)    All other components within normal limits  CBG MONITORING, ED - Abnormal; Notable for the following components:   Glucose-Capillary 112 (*)    All other components within normal limits     ECG: Ordered Personally reviewed and interpreted by me showing: HR : 67 Rhythm:  Normal sinus rhythm Left axis deviation Moderate voltage criteria for LVH, may be normal variant ( R in aVL , [employer] product ) QTC 376    The  recent clinical data is shown below. Vitals:   05/27/22 1300 05/27/22 1311 05/27/22 1600 05/27/22 1730  BP: (!) 127/55 (!) 127/55 127/62   Pulse: 68 71 79   Resp: 15  16   Temp:  98.5 F (36.9 C)  98.2 F (36.8 C)  TempSrc:  Oral  Oral  SpO2: 96% 100%  99%   Weight:      Height:         WBC     Component Value Date/Time   WBC 7.7 05/26/2022 1832   LYMPHSABS 2.8 10/16/2018 1916   MONOABS 0.4 10/16/2018 1916   EOSABS 0.2 10/16/2018 1916   BASOSABS 0.0 10/16/2018 1916       UA   no evidence of UTI     Urine analysis:    Component Value Date/Time   COLORURINE COLORLESS (A) 05/26/2022 1820   APPEARANCEUR CLEAR 05/26/2022 1820   LABSPEC 1.011 05/26/2022 1820   PHURINE 5.5 05/26/2022 1820   GLUCOSEU NEGATIVE 05/26/2022 1820   HGBUR NEGATIVE 05/26/2022 1820   BILIRUBINUR NEGATIVE 05/26/2022 1820   KETONESUR NEGATIVE 05/26/2022 1820   PROTEINUR NEGATIVE 05/26/2022 1820   UROBILINOGEN 1.0 10/17/2010 1558   NITRITE NEGATIVE 05/26/2022 1820   LEUKOCYTESUR NEGATIVE 05/26/2022 1820    Results for orders placed or performed during the hospital encounter of 04/22/22  Surgical pcr screen     Status: None   Collection Time: 04/22/22  2:33 PM   Specimen: Nasal Mucosa; Nasal Swab  Result Value Ref Range Status   MRSA, PCR NEGATIVE NEGATIVE Final   Staphylococcus aureus NEGATIVE NEGATIVE Final    Comment: (NOTE) The Xpert SA Assay (FDA approved for NASAL specimens in patients 81 years of age and older), is one component of a comprehensive surveillance program. It is not intended to diagnose infection nor to guide or monitor treatment. Performed at [facility], [address] [place], [place] [address]      _______________________________________________ Hospitalist was called for admission for   Diplopia, debility    The following Work up has been ordered so far:  Orders Placed This Encounter  Procedures   CT ANGIO HEAD NECK W WO CM   DG HIP  UNILAT WITH PELVIS 2-3 VIEWS RIGHT   Basic metabolic panel   CBC   Urinalysis, Routine w reflex microscopic   Document Height and Actual Weight   NPO Except for Meds   Cardiac monitoring   Consult to neurology   Consult to hospitalist   CBG monitoring, ED   CBG monitoring, ED   ED EKG   EKG 12-Lead   Place in observation (patient's expected length of stay will be less than 2 midnights)     OTHER Significant initial  Findings:  labs showing:    Recent Labs  Lab 05/26/22 1832  NA 135  K 4.9  CO2 25  GLUCOSE 108*  BUN 28*  CREATININE 1.18*  CALCIUM 10.0    Cr   Up from baseline see below Lab Results  Component Value Date   CREATININE 1.02 (H) 05/27/2022   CREATININE 1.18 (H) 05/26/2022   CREATININE 1.03 (H) 04/22/2022    Recent Labs  Lab 05/27/22 1925  AST 18  ALT 18  ALKPHOS 93  BILITOT 0.5  PROT 6.3*  ALBUMIN 3.6   Lab Results  Component Value Date   CALCIUM 10.0 05/26/2022           Plt: Lab Results  Component Value Date   PLT 283 05/26/2022    Recent Labs  Lab 05/26/22 1832  WBC 7.7  HGB 12.2  HCT 38.0  MCV 89.4  PLT 283    HG/HCT  stable,     Component Value Date/Time   HGB 11.8 (L) 05/27/2022 1925   HCT 36.4 05/27/2022 1925   MCV 87.5 05/27/2022 1925  Cardiac Panel (last 3 results) Recent Labs    05/27/22 1925  CKTOTAL 29*      DM  labs:  HbA1C: Recent Labs    04/22/22 1433  HGBA1C 6.5*       CBG (last 3)  Recent Labs    05/26/22 1834 05/26/22 1946 05/27/22 2042  GLUCAP 112* 96 106*          Cultures:    Component Value Date/Time   SDES URINE, CLEAN CATCH 05/31/2017 1051   SPECREQUEST NONE 05/31/2017 1051   CULT  05/31/2017 1051    NO GROWTH Performed at [facility], [place] [address], [place], [place] [address]    REPTSTATUS 06/01/2017 FINAL 05/31/2017 1051     Radiological Exams on Admission: CT ANGIO HEAD NECK W WO CM  Result Date: 05/26/2022 CLINICAL DATA:  Diplopia, dizziness  EXAM: CT ANGIOGRAPHY HEAD AND NECK TECHNIQUE: Multidetector CT imaging of the head and neck was performed using the standard protocol during bolus administration of intravenous contrast. Multiplanar CT image reconstructions and MIPs were obtained to evaluate the vascular anatomy. Carotid stenosis measurements (when applicable) are obtained utilizing NASCET criteria, using the distal internal carotid diameter as the denominator. RADIATION DOSE REDUCTION: This exam was performed according to the departmental dose-optimization program which includes automated exposure control, adjustment of the mA and/or kV according to patient size and/or use of iterative reconstruction technique. CONTRAST:  80m OMNIPAQUE IOHEXOL 350 MG/ML SOLN COMPARISON:  12/25/2020 CTA head neck; correlation is also made with 01/07/2022 CT head FINDINGS: CT HEAD FINDINGS Brain: No evidence of acute infarct, hemorrhage, mass, mass effect, or midline shift. No hydrocephalus or extra-axial fluid collection. Periventricular white matter changes, likely the sequela of chronic small vessel ischemic disease. Vascular: No hyperdense vessel. Skull: Normal. Negative for fracture or focal lesion. Sinuses/Orbits: No acute finding. Status post bilateral lens replacements. Other: The mastoid air cells are well aerated. CTA NECK FINDINGS Aortic arch: Standard branching. Imaged portion shows no evidence of aneurysm or dissection. Aortic atherosclerosis. Approximately 40% stenosis of the proximal left subclavian secondary to calcified plaque (series 11, image 274). Right carotid system: Approximately 60% stenosis in the proximal right ICA, which appears slightly worse than in 2022. No evidence of dissection or occlusion. Left carotid system: Less than 50% stenosis in the proximal left ICA, similar to prior. No evidence of dissection or occlusion Vertebral arteries: No evidence of dissection, occlusion, or hemodynamically significant stenosis (greater than 50%).  Skeleton: No acute osseous abnormality. Degenerative changes in the cervical spine. Other neck: Multiple subcentimeter hypoattenuating nodules in the thyroid, for which no follow-up is indicated. (Reference: J Am Coll Radiol. 2015 Feb;12(2): 143-50) Upper chest: Negative. Review of the MIP images confirms the above findings CTA HEAD FINDINGS Anterior circulation: Both internal carotid arteries are patent to the termini, with mild-to-moderate stenosis in the bilateral cavernous and supraclinoid ICAs, which appears to have progressed from the prior exam. A1 segments patent. Normal anterior communicating artery. Anterior cerebral arteries are patent to their distal aspects. No M1 stenosis or occlusion. MCA branches perfused and symmetric. Posterior circulation: The right vertebral artery primarily supplies the right PICA. The left vertebral artery is patent to the vertebrobasilar junction. Posterior inferior cerebellar arteries patent proximally. Basilar patent to its distal aspect. Superior cerebellar arteries patent proximally. Fetal origin of the left PCA and near fetal origin of the right PCA, with a diminutive right P1 and prominent bilateral posterior communicating arteries. PCAs perfused to their distal aspects with moderate narrowing in  the proximal right P2 section (series 11, images 109-110), similar to prior, and otherwise diffuse mild multifocal narrowing in the left-greater-than-right PCA. Venous sinuses: As permitted by contrast timing, patent. Anatomic variants: None significant. Review of the MIP images confirms the above findings IMPRESSION: 1. No intracranial large vessel occlusion. Moderate narrowing in the proximal right P2, mild-to-moderate stenosis in the bilateral cavernous and supraclinoid ICAs, and mild multifocal narrowing in the left-greater-than-right PCA. 2. 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 3.   No acute intracranial process. Electronically Signed   By: Merilyn Baba M.D.   On: 05/26/2022 21:33   DG HIP UNILAT WITH PELVIS 2-3 VIEWS RIGHT  Result Date: 05/26/2022 CLINICAL DATA:  Hip pain.  Multiple falls. EXAM: DG HIP (WITH OR WITHOUT PELVIS) 2-3V RIGHT COMPARISON:  None Available. FINDINGS: No acute fracture of the pelvis or right hip. The femoral head is well seated in the acetabulum. Slight acetabular spurring. Intact pubic rami and bony pelvis. Pubic symphysis and sacroiliac joints are congruent. No evidence of avascular necrosis or focal bone abnormality. IMPRESSION: No fracture of the pelvis or right hip. Electronically Signed   By: Keith Rake M.D.   On: 05/26/2022 21:18   _______________________________________________________________________________________________________ Latest  Blood pressure 127/62, pulse 79, temperature 98.2 F (36.8 C), temperature source Oral, resp. rate 16, height 5' 1.25" (1.556 m), weight 65.8 kg, SpO2 99 %.   Vitals  labs and radiology finding personally reviewed  Review of Systems:    Pertinent positives include:  fatigue, falls  Constitutional:  No weight loss, night sweats, Fevers, chills, , weight loss  HEENT:  No headaches, Difficulty swallowing,Tooth/dental problems,Sore throat,  No sneezing, itching, ear ache, nasal congestion, post nasal drip,  Cardio-vascular:  No chest pain, Orthopnea, PND, anasarca, dizziness, palpitations.no Bilateral lower extremity swelling  GI:  No heartburn, indigestion, abdominal pain, nausea, vomiting, diarrhea, change in bowel habits, loss of appetite, melena, blood in stool, hematemesis Resp:  no shortness of breath at rest. No dyspnea on exertion, No excess mucus, no productive cough, No non-productive cough, No coughing up of blood.No change in color of mucus.No wheezing. Skin:  no rash or lesions. No jaundice GU:  no dysuria, change in color of urine, no urgency or frequency. No straining to urinate.   No flank pain.  Musculoskeletal:  No joint pain or no joint swelling. No decreased range of motion. No back pain.  Psych:  No change in mood or affect. No depression or anxiety. No memory loss.  Neuro: no localizing neurological complaints, no tingling, no weakness, no double vision, no gait abnormality, no slurred speech, no confusion  All systems reviewed and apart from [place] all are negative _______________________________________________________________________________________________ Past Medical History:   Past Medical History:  Diagnosis Date   Anxiety    Atrial fibrillation (HCC)    Intermittent   CAD (coronary artery disease)    non obstructive   CKD (chronic kidney disease)    Diabetes mellitus without complication (HCC)    Dysrhythmia    PAF   GERD (gastroesophageal reflux disease)    Glaucoma    Narrow Angle   Heart murmur    HTN (hypertension)    Hyperlipidemia    Hypothyroid    Major depression    10-17-2010: hospitalized for suicidal, [facility], [facility] (D/C 10-30-2010)   Migraine headache    OSA (obstructive sleep apnea)    CPAP nightly  Peripheral neuropathy    Presence of permanent cardiac pacemaker    Seasonal allergies    Sleep apnea    Tachy-brady syndrome ([place]) 04/06/2006   PPM placed      Past Surgical History:  Procedure Laterality Date   ABDOMINAL ADHESION SURGERY  2008   exploratory to remove attached to the abdominal wall, stomach and intestines.    ABDOMINAL HYSTERECTOMY  1976   APPENDECTOMY     BREAST EXCISIONAL BIOPSY     BREAST SURGERY     fibroids removed, benign   CARDIAC CATHETERIZATION  02-12-2006   Minor irregularities: RCA-mid 40%, LM-normal, LAD-normal. EF 65%.   CHOLECYSTECTOMY     ECTOPIC PREGNANCY SURGERY     treatment x3, last one 11-02-2009 at [facility]   goiter     goiter removed     MASS EXCISION Right 10/18/2015   Procedure: EXCISION OF 6CM MASS ON RIGHT  NECK/BACK;  Surgeon: [name],  MD;  Location: [facility];  Service: General;  Laterality: Right;   PACEMAKER GENERATOR CHANGE N/A 01/15/2012   Procedure: PACEMAKER GENERATOR CHANGE;  Surgeon: Deboraha Sprang, MD;  Location: [facility] CATH LAB;  Service: Cardiovascular;  Laterality: N/A;   PACEMAKER INSERTION     Permanent. Medtronic EnRhyth 04/2006, set as DDDR   REVERSE SHOULDER ARTHROPLASTY Right 05/01/2022   Procedure: REVERSE SHOULDER ARTHROPLASTY;  Surgeon: Hiram Gash, MD;  Location: [facility];  Service: Orthopedics;  Laterality: Right;   TUMOR REMOVAL  2000   Fibroid from breast x4-most recent   WRIST FRACTURE SURGERY  11-2007   right, had metal plate inserted    Social History:  Ambulatory  walker       reports that she has quit smoking. She has never used smokeless tobacco. She reports that she does not drink alcohol and does not use drugs.     Family History:   Family History  Problem Relation Age of Onset   Stomach cancer Father    Atrial fibrillation Brother    ______________________________________________________________________________________________ Allergies: Allergies  Allergen Reactions   Crestor [Rosuvastatin Calcium] Other (See Comments)    Caused Myalgias   Effexor [Venlafaxine Hydrochloride] Other (See Comments)    Caused BP to go up   Adhesive [Tape] Rash   Hibiclens [Chlorhexidine Gluconate] Hives   Lipitor [Atorvastatin Calcium] Rash   Nardil Other (See Comments)    Caused BP Problems   Penicillins Swelling    Tolerated Cephalosporin Date: 05/01/22.     Plavix [Clopidogrel Bisulfate] Rash   Simvastatin Rash   Sulfonamide Derivatives Rash   Zestril [Lisinopril] Rash     Prior to Admission medications   Medication Sig Start Date End Date Taking? Authorizing Provider  aspirin (ASPIRIN CHILDRENS) 81 MG chewable tablet Chew 1 tablet (81 mg total) by mouth 2 (two) times daily. For 6 weeks for DVT prophylaxis after surgery 05/02/22 06/13/22  Ethelda Chick, PA-C   benazepril (LOTENSIN) 10 MG tablet Take 10 mg by mouth daily.    [provider]  benzocaine-menthol (CHLORASEPTIC SORE THROAT) 6-10 MG lozenge Take 1 lozenge by mouth every 2 (two) hours as needed for sore throat.    [provider]  Cholecalciferol (VITAMIN D-3) 125 MCG (5000 UT) TABS Take 5,000 Units by mouth daily.    [provider]  cholestyramine (QUESTRAN) 4 g packet Take 4 g by mouth daily. 08/13/18   [provider]  diclofenac (VOLTAREN) 75 MG EC tablet Take 75 mg by mouth 2 (two) times  daily. 05/10/22   [provider]  fenofibrate 160 MG tablet Take 160 mg by mouth daily. Take with food 12/30/18   [provider]  ferrous sulfate 325 (65 FE) MG tablet Take 325 mg by mouth daily.    [provider]  fexofenadine-pseudoephedrine (ALLEGRA-D) 60-120 MG 12 hr tablet Take 1 tablet by mouth 2 (two) times daily.    [provider]  fluticasone (FLONASE) 50 MCG/ACT nasal spray Place 1 spray into the nose 2 (two) times daily.    [provider]  furosemide (LASIX) 20 MG tablet TAKE 1 TABLET BY MOUTH 3 TIMES A WEEK. 05/07/21   Jerline Pain, MD  gabapentin (NEURONTIN) 300 MG capsule Take 300 mg by mouth 3 (three) times daily.    [provider]  galantamine (RAZADYNE ER) 8 MG 24 hr capsule Take 8 mg by mouth daily. 08/13/18   [provider]  hydrOXYzine (ATARAX/VISTARIL) 25 MG tablet Take 25 mg by mouth at bedtime.    [provider]  levothyroxine (SYNTHROID, LEVOTHROID) 75 MCG tablet Take 75 mcg by mouth daily.    [provider]  meloxicam (MOBIC) 15 MG tablet Take 1 tablet (15 mg total) by mouth daily. For 2 weeks for pain and inflammation. Then take as needed 05/02/22   McBane, Maylene Roes, PA-C  metFORMIN (GLUCOPHAGE) 500 MG tablet Take 500 mg by mouth 2 (two) times daily.    [provider]  methocarbamol (ROBAXIN) 500 MG tablet Take 1 tablet (500 mg total) by mouth every  8 (eight) hours as needed for muscle spasms. 05/03/22   McBane, Maylene Roes, PA-C  metoprolol succinate (TOPROL-XL) 100 MG 24 hr tablet TAKE 1 TABLET(100 MG) BY MOUTH DAILY 04/25/21   Jerline Pain, MD  Multiple Vitamins-Minerals (ADULT GUMMY PO) Take 1 capsule by mouth daily.    [provider]  nortriptyline (PAMELOR) 25 MG capsule Take 25 mg by mouth at bedtime. Take with a 75 mg capsule for a total dose of 100 mg nightly 12/05/20   [provider]  nortriptyline (PAMELOR) 75 MG capsule Take 75 mg by mouth at bedtime. Take with a 25 mg capsule for a total dose of 100 mg nightly    [provider]  oxyCODONE (OXY IR/ROXICODONE) 5 MG immediate release tablet Take 2.5 mg by mouth every 6 (six) hours as needed. 05/23/22   [provider]  pantoprazole (PROTONIX) 40 MG tablet Take 40 mg by mouth 2 (two) times daily. 06/01/18   [provider]  PRESCRIPTION MEDICATION Inhale into the lungs at bedtime. CPAP    [provider]  sodium chloride (OCEAN) 0.65 % SOLN nasal spray Place 1 spray into both nostrils every 2 (two) hours as needed for congestion.    [provider]  spironolactone (ALDACTONE) 25 MG tablet Take 1 tablet (25 mg total) by mouth daily. Patient taking differently: Take 25 mg by mouth at bedtime. 10/03/17   Deboraha Sprang, MD  traMADol (ULTRAM) 50 MG tablet Take 1-2 tablets (50-100 mg total) by mouth every 8 (eight) hours as needed for severe pain. 05/02/22   Ethelda Chick, PA-C    ___________________________________________________________________________________________________ Physical Exam:    05/27/2022    4:00 PM 05/27/2022    1:11 PM 05/27/2022    1:00 PM  Vitals with BMI  Systolic [phone]  Diastolic 62 55 55  Pulse 79 71 68     1. General:  in No  Acute distress  Chronically ill  -appearing 2. Psychological: Alert and  Oriented 3. Head/ENT:  Dry Mucous Membranes                          Head Non  traumatic, neck supple                           Poor Dentition 4. SKIN decreased Skin turgor,  Skin clean Dry and intact no rash 5. Heart: Regular rate and rhythm no  Murmur, no Rub or gallop 6. Lungs:  Clear to auscultation bilaterally, no wheezes or crackles   7. Abdomen: Soft,  non-tender, Non distended   obese  bowel sounds present 8. Lower extremities: no clubbing, cyanosis, no  edema 9. Neurologically  strength 5 out of 5 in all 4 extremities cranial nerves II through XII intact, unable to assess right arm drift due to wearing a sling 10. MSK: Normal range of motion    Chart has been reviewed  ______________________________________________________________________________________________  Assessment/Plan 81 y.o. female with medical history significant of HTN, HLD, depression, GERD, glaucoma, atrial fibrillation not on anticoagulation, CAD, CKD, OSA    Tachy-bradycardia - sp pacemaker    Admitted for     Fall, initial encounter , visual disturbance Gait instability  Present on Admission:  TIA (transient ischemic attack)  A-fib ([place])  Tachycardia-bradycardia ([place])  HTN (hypertension)  Type II diabetes mellitus with renal manifestations ([place])  Chronic diastolic CHF (congestive heart failure) ([place])  Hypothyroidism  Falls, initial encounter  Hypomagnesemia     TIA (transient ischemic attack) Unsure if true TIA  pt seems to be progressively declining  CTA showing No intracranial large vessel occlusion but  Moderate narrowing in the proximal right P2, mild-to-moderate stenosis in the bilateral cavernous and supraclinoid ICAs, and mild multifocal narrowing in the left-greater-than-right PCA.  . 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  HTN (hypertension) Continue Toprol  At 100 mg po q day  Given mild AKI hold ACEi and spironolactone  Type II diabetes mellitus with renal manifestations ([place]) Order SSI  hold metformin  Chronic diastolic CHF (congestive heart failure) ([place]) Last echo 5 m ago shows preserved EF Given possible neurological event will repeat  Hypothyroidism - Check TSH continue home medications synthroid at 75 mcg a day at current dose   Falls, initial encounter Will need pT OT eval prior to dc Avoid strong narcotics and sedating meds    Hypomagnesemia replete and recheck in am  Ataxia Seen by Neurology their recs  : -Pacemaker interrogation (Please call cardiac pacemaker tech in a.m. for interrogation)   -Given her pacemaker is not MRI compatible, will obtain CT C-spine ( done unchanged)  -A1c, lipid panel, TSH, B12, MMA, thiamine, ammonia, RPR, HIV -EEG, routine -Agree with T4 testing given elevated TSH -Consider tapering hydroxyzine or gabapentin gradually on an outpatient basis -Would avoid tapering nortriptyline for now due to her psychiatric history -Minimize opiates -Agree with single-fiber nerve conduction study planned for myasthenia gravis testing -Consider outpatient neurocognitive evaluation and driving evaluation to confirm safety -PT/OT evaluation -Neurology will follow-up EEG  and MRI brain and cervical spine, but otherwise will be available on an as-needed basis. Remainder of the work-up can be followed up by primary team/PCP/outpatient neurologist    Dementia with behavioral disturbance ([place]) Watch for any sign of sundowning.  Continue home medications Treat agitation as needed But would try to avoid oversedation in the daytime as this can contribute to falls. Likely will need higher level of care at the time of discharge    Other plan as per orders.  DVT prophylaxis:  SCD    Code Status:   DNR/DNI   as per patient   I had personally discussed CODE STATUS with patient     Family Communication:   Family not at  Bedside    Disposition Plan:     likely will need placement for rehabilitation                         Following barriers for discharge:                                                  Would benefit from PT/OT eval prior to DC  Ordered                   Swallow eval - SLP ordered                                     Transition of care consulted                   Nutrition    consulted                                      Consults called:  neurology is aware  Admission status:  ED Disposition     ED Disposition  [place]: [place] [[id number]]  Level of Care: Telemetry Medical [104]  Interfacility transfer: Yes  May place patient in observation at [facility] or [place] if equivalent level of care is available:: No  Covid Evaluation: Asymptomatic - no recent exposure (last 10 days) testing not required  Diagnosis: TIA (transient ischemic attack) [409811]  Admitting Physician: Dory Horn [[id number]]  Attending Physician: Dory Horn [[id number]]           Obs      Level of care     tele  For  24H       Isaly Fasching 05/28/2022, 12:56 AM    [facility]     after 2 AM please page floor coverage PA If 7AM-7PM, please contact the day team taking care of the patient using [email or web address]   Patient was evaluated in the context of the global COVID-19 pandemic, which necessitated consideration that the patient might be at risk for infection with the SARS-CoV-2 virus that causes COVID-19. Institutional protocols and algorithms that pertain to the evaluation of patients at risk for COVID-19 are in a state of rapid change based on information released by regulatory bodies including the CDC and federal and state organizations. These policies and algorithms were followed during the  patient's care.

## 2022-05-27 NOTE — ED Notes (Signed)
Frozen meal given with diet shasta

## 2022-05-27 NOTE — Assessment & Plan Note (Signed)
Continue Toprol  At 100 mg po q day  Given mild AKI hold ACEi and spironolactone

## 2022-05-27 NOTE — Subjective & Objective (Signed)
Came in w multiple falls over past 3 wks and diplopia Had a Shoulder surgery on  05/01/2022  At baseline walks with a walker but has gotten worse over past 3 wks Had diplopia in the past but has gotten worse Now lives at [facility] Was ambulating with a walker when fell

## 2022-05-27 NOTE — Consult Note (Signed)
Neurology Consultation Reason for Consult: Frequent falls / dizziness  Requesting Physician: Toy Baker  CC: Dizziness leading to falls   History is obtained from: Patient, daughter and chart review  HPI: Heidi [name] is a 81 y.o. 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  Since that time she has been living in an assisted living facility and has not been driving as much.  She did have a shoulder replacement 3 weeks ago, especially performed to allow her to continue to use her walker.  She reports that overall her shoulder pain has been improving after surgery, but she notes it has been worse again in the last few days.  In the last week she has had increased frequency of falls even when using her walker, falling on Friday, 3 times on Tuesday, and twice on Saturday leading to her presentation to the ED.  Due to her frequent falls it is unclear if she is safe to continue in her ALF and neurology is asked to evaluate for the etiology of this.  She reports that her falls are associated with visual changes which she describes as a jigsaw puzzle that is all jumbled up.  Notably she is being evaluated for ptosis by Dr. Posey Pronto on an outpatient basis and her myasthenia antibody testing has been negative.  To Dr. Posey Pronto she did not describe any fatigability of her ptosis, but to me she reports it is worse throughout the day.  She notes she has a daughter with the diagnosis of myasthenia gravis as well although this diagnosis is per patient's report somewhat questionable her  daughter has benefited from medications to treat myasthenia.  She does report double vision that is longstanding as well and treated with prisms in her glasses, but she feels that this has been worsening lately on lateral gaze which she has noticed especially when driving.  Notably on examination today she did not have diplopia on lateral gaze, but she did have diplopia with sustained upgaze after about 20 seconds.  Additionally she was noted to have diplopia on right lateral gaze on the ED provider's examination prior to transfer to [facility].  The patient's daughter also describes some visual confusion, for example the patient felt like her daughter's name tag had said something other than "visitor" when she had first seen it and then was surprised to see it say visitor.  She does have a remote history of migraine headaches with aura but describes that aura is distinctly different ("colored air") than the visual disturbances she is having now.  These disturbances last from anywhere for a few seconds to all day long and has been happening more frequently and longer although the amount of time has still been variable.  She has not been able to identify a clear trigger for these instances although subsequently during review of systems she does feel they may be triggered by lower back pain.  She does have a very significant history of depression requiring inpatient hospitalization in 2012 due to suicidal ideation.  Of note at that time she had significant cognitive impairment with her depression, for example getting lost while driving to church and not  being sure which order in which to put on her make-up.  Her family had taken away her car and sold it at the time, however after she was discharged she bought herself a replacement.  Her family would not feel comfortable being in the car with her at this time and do have concerns about her driving.  She has been stable on nortriptyline since that admission.  She also  takes hydroxyzine 50 mg nightly for sleep as well as gabapentin 300 mg 3 times daily for her neuropathy.  She has been using tramadol for postoperative pain but this use has been decreasing due to improving pain.  She has been avoiding using oxycodone for pain due to worry about its addictive nature.  She denies any recent changes in her medications.  Family is concerned about some increasing memory impairment over the last 6 months for example she did not remember being told her granddaughter was pregnant again (although she does remember this pregnancy during our conversation).  The patient notes some increasing difficulty with word finding at times, which causes her some embarrassment when conversing with friends, and feels that this has been episodic over the course of this year.  Regarding her history of atrial fibrillation, daughter is a cardiac nurse and reports patient had 1 episode of atrial fibrillation lasting a few seconds but has never had any further arrhythmias on pacemaker interrogation.  LKW: April 2023 tPA given?: No, out of the window  IA performed?: No, exam not consistent with LVO  premorbid modified rankin scale:      1 - No significant disability. Able to carry out all usual activities, despite some symptoms.   ROS: All other review of systems was negative except as noted in the HPI, and note of a dry cough with some shortness of breath that has been improving   Past Medical History:  Diagnosis Date   Anxiety    Atrial fibrillation ([place])    Intermittent   CAD (coronary artery disease)    non obstructive   CKD (chronic kidney disease)    Diabetes mellitus without complication (HCC)    Dysrhythmia    PAF   GERD (gastroesophageal reflux disease)    Glaucoma    [place]   Heart murmur    HTN (hypertension)    Hyperlipidemia    Hypothyroid    Major depression    10-17-2010: hospitalized for suicidal, [facility], [place] (D/C 10-30-2010)   Migraine  headache    OSA (obstructive sleep apnea)    CPAP nightly   Peripheral neuropathy    Presence of permanent cardiac pacemaker    Seasonal allergies    Sleep apnea    Tachy-brady syndrome ([place]) 04/06/2006   PPM placed   Past Surgical History:  Procedure Laterality Date   ABDOMINAL ADHESION SURGERY  2008   exploratory to remove attached to the abdominal wall, stomach and intestines.    ABDOMINAL HYSTERECTOMY  1976   APPENDECTOMY     BREAST EXCISIONAL BIOPSY     BREAST SURGERY     fibroids removed, benign   CARDIAC CATHETERIZATION  02-12-2006   Minor irregularities: RCA-mid 40%, LM-normal, LAD-normal. EF 65%.   CHOLECYSTECTOMY     ECTOPIC PREGNANCY SURGERY     treatment x3, last one 11-02-2009 at [facility]   goiter     goiter removed     MASS EXCISION Right 10/18/2015   Procedure: EXCISION OF 6CM MASS ON RIGHT  NECK/BACK;  Surgeon: [name], MD;  Location: [facility];  Service: General;  Laterality: Right;   PACEMAKER GENERATOR CHANGE N/A 01/15/2012   Procedure: PACEMAKER GENERATOR CHANGE;  Surgeon: Deboraha Sprang, MD;  Location: [facility] CATH LAB;  Service: Cardiovascular;  Laterality: N/A;   PACEMAKER INSERTION     Permanent. [employer] EnRhyth 04/2006, set as DDDR   REVERSE SHOULDER ARTHROPLASTY Right 05/01/2022   Procedure: REVERSE SHOULDER ARTHROPLASTY;  Surgeon: Hiram Gash, MD;  Location: [facility] ORS;  Service: Orthopedics;  Laterality: Right;   TUMOR REMOVAL  2000   Fibroid from breast x4-most recent   WRIST FRACTURE SURGERY  11-2007   right, had metal plate inserted   Current Outpatient Medications  Medication Instructions   aspirin (ASPIRIN CHILDRENS) 81 mg, Oral, 2 times daily, For 6 weeks for DVT prophylaxis after surgery   benazepril (LOTENSIN) 10 mg, Oral, Daily   benzocaine-menthol (CHLORASEPTIC SORE THROAT) 6-10 MG lozenge 1 lozenge, Oral, Every 2 hours PRN   cholestyramine (QUESTRAN) 4 g, Oral, Daily   diclofenac (VOLTAREN) 75 mg, Oral, 2 times  daily   fenofibrate 160 mg, Oral, Daily, Take with food   ferrous sulfate 325 mg, Oral, Daily   fexofenadine-pseudoephedrine (ALLEGRA-D) 60-120 MG 12 hr tablet 1 tablet, Oral, 2 times daily   fluticasone (FLONASE) 50 MCG/ACT nasal spray 1 spray, Nasal, 2 times daily   furosemide (LASIX) 20 MG tablet TAKE 1 TABLET BY MOUTH 3 TIMES A WEEK.   gabapentin (NEURONTIN) 300 mg, Oral, 3 times daily,     galantamine (RAZADYNE ER) 8 mg, Oral, Daily   hydrOXYzine (ATARAX) 25 mg, Oral, Daily at bedtime   levothyroxine (SYNTHROID) 75 mcg, Oral, Daily,     meloxicam (MOBIC) 15 mg, Oral, Daily, For 2 weeks for pain and inflammation. Then take as needed   metFORMIN (GLUCOPHAGE) 500 mg, Oral, 2 times daily   methocarbamol (ROBAXIN) 500 mg, Oral, Every 8 hours PRN   metoprolol succinate (TOPROL-XL) 100 MG 24 hr tablet TAKE 1 TABLET(100 MG) BY MOUTH DAILY   Multiple Vitamins-Minerals (ADULT GUMMY PO) 1 capsule, Oral, Daily   nortriptyline (PAMELOR) 75 mg, Oral, Daily at bedtime, Take with a 25 mg capsule for a total dose of 100 mg nightly   nortriptyline (PAMELOR) 25 mg, Oral, Daily at bedtime, Take with a 75 mg capsule for a total dose of 100 mg nightly   oxyCODONE (OXY IR/ROXICODONE) 2.5 mg, Oral, Every 6 hours PRN   pantoprazole (PROTONIX) 40 mg, Oral, 2 times daily   PRESCRIPTION MEDICATION Inhalation, Daily at bedtime, CPAP   sodium chloride (OCEAN) 0.65 % SOLN nasal spray 1 spray, Each Nare, Every 2 hours PRN   spironolactone (ALDACTONE) 25 mg, Oral, Daily   traMADol (ULTRAM) 50-100 mg, Oral, Every 8 hours PRN   Vitamin D-3 5,000 Units, Oral, Daily     Family History  Problem Relation Age of Onset   Stomach cancer Father    Atrial fibrillation Brother    Social History:  reports that she has quit smoking. She has never used smokeless tobacco. She reports that she does not drink alcohol and does not use drugs.  Exam: Current vital signs: BP 127/62   Pulse 75   Temp 98 F (36.7 C) (Oral)    Resp 16   Ht 5' 1.25" (1.556 m)   Wt 65.8 kg   SpO2 99%   BMI 27.17 kg/m  Vital signs in last 24 hours: Temp:  [97.8 F (36.6 C)-99.2 F (37.3 C)] 98 F (36.7 C) (08/21 2005) Pulse  Rate:  [60-101] 75 (08/21 2005) Resp:  [9-21] 16 (08/21 1600) BP: (113-162)/(55-73) 127/62 (08/21 1600) SpO2:  [95 %-100 %] 99 % (08/21 1600)   Physical Exam  Constitutional: Appears well-developed and well-nourished.  Guarding of the right shoulder but otherwise no acute distress Psych: Affect appropriate to situation, calm and cooperative after initially declining examination to take "happy birthday" calls from family Eyes: No scleral injection.  Mild bilateral ptosis slightly worse on the left than the right which does not appear fatigable HENT: No oropharyngeal obstruction.  MSK: no joint deformities.  Mildly limited lateral range of motion of the neck bilaterally Cardiovascular: Normal rate and regular rhythm. Perfusing extremities well Respiratory: Effort normal, non-labored breathing GI: Soft.  No distension. There is no tenderness.  Skin: Warm dry and intact visible skin  Neuro: Mental Status: Patient is awake, alert, oriented to person, place, month, year, and situation. Patient is able to give a clear and coherent history. Only mild aphasia at best (initially called the little finger "wee" finger and seemed to be struggling to find the words she wanted, mild difficulty repeating complex sentences).  However she does have some motor apraxia, correctly copying only 1 out of 3 complex finger positions Cranial Nerves: II: Visual Fields are full. Pupils are equal, round, and reactive to light.   III,IV, VI: EOMI, no nystagmus or diplopia on lateral gaze.  However there was some mild diplopia after about 30 seconds of sustained upgaze V: Facial sensation is symmetric to temperature and light touch VII: Facial movement is symmetric.  VIII: hearing is intact to voice X: Uvula elevates  symmetrically XI: Shoulder shrug is symmetric. XII: tongue is midline without atrophy or fasciculations.  Motor: Tone is normal. Bulk is normal. 5/5 strength was present in all four extremities, except pain limited in the right upper extremity for which she declined deltoid testing and was 4+ on elbow extension Sensory: Sensation is symmetric to light touch and temperature in the arms and legs, with a length dependent loss of temperature Deep Tendon Reflexes: Hypoactive throughout Plantars: Mute bilaterally Cerebellar: FNF and HKS are intact bilaterally Gait:  Deferred by patient due to no availability of her walker  NIHSS total 0 Performed at 10:30 PM  I have reviewed labs in epic and the results pertinent to this consultation are:  Basic Metabolic Panel: Recent Labs  Lab 05/26/22 1832 05/27/22 1925  NA 135 137  K 4.9 4.9  CL 101 107  CO2 25 24  GLUCOSE 108* 123*  BUN 28* 18  CREATININE 1.18* 1.02*  CALCIUM 10.0 9.9  MG  --  1.6*  PHOS  --  2.9    CBC: Recent Labs  Lab 05/26/22 1832 05/27/22 1925  WBC 7.7 8.0  NEUTROABS  --  3.7  HGB 12.2 11.8*  HCT 38.0 36.4  MCV 89.4 87.5  PLT 283 267    Coagulation Studies: Recent Labs    05/27/22 1925  LABPROT 13.1  INR 1.0    Lab Results  Component Value Date   TSH 5.134 (H) 05/27/2022  Free T4 pending  Lab Results  Component Value Date   CHOL 188 04/02/2011   HDL 58.40 04/02/2011   LDLCALC 113 (H) 04/02/2011   TRIG 85.0 04/02/2011   CHOLHDL 3 04/02/2011   Lab Results  Component Value Date   HGBA1C 6.5 (H) 04/22/2022    I have reviewed the images obtained:  CTA personally reviewed, agree with radiology:  1. No intracranial large vessel occlusion. Moderate  narrowing in the proximal right P2, mild-to-moderate stenosis in the bilateral cavernous and supraclinoid ICAs, and mild multifocal narrowing in the left-greater-than-right PCA. 2. 60% stenosis in the proximal right ICA, with less than 50% stenosis  in the proximal left ICA and proximal left subclavian artery. 3.  No acute intracranial process  Impression: Broad differential for her falls with likely a multifactorial gait instability.  Potential etiologies include -Diplopia secondary to possible myasthenia gravis (supported by variable diplopia on history worse through the course of the day and developing diplopia with sustained upgaze on my evaluation) -Vasovagal episodes related to pain -Neuropathy -Stereotyped visual disturbance may represent ocular migraine versus occipital seizure ("jigsaw" vision she describes is atypical for vasovagal syncope) vs potential progression of underlying dementia with parietal dysfunction given her motor apraxia on examination (will test for reversible causes of dementia) -Cardiogenic causes -Incompletely treated hypothyroidism -Polypharmacy with CKD and age-related changes in metabolism potentially requiring dose decreases of her medications -Possible myelopathy (her significant neuropathy may be masking hyperreflexia) -Possible stroke(s) too small to be seen on head CT -Per daughter there was concern about vertebrobasilar insufficiency (VBI) in the past.  Her CTA here does show right P2 stenosis, but I do not see extensive posterior circulation atherosclerosis consistent with a VBI  Recommendations: -Pacemaker interrogation -Given her pacemaker is not MRI compatible, will obtain CT C-spine -A1c, lipid panel, TSH, B12, MMA, thiamine, ammonia, RPR, HIV -EEG, routine -Agree with T4 testing given elevated TSH -Consider tapering hydroxyzine or gabapentin gradually on an outpatient basis -Would avoid tapering nortriptyline for now due to her psychiatric history -Minimize opiates -Agree with single-fiber nerve conduction study planned for myasthenia gravis testing -Consider outpatient neurocognitive evaluation and driving evaluation to confirm safety -PT/OT evaluation -Neurology will follow-up EEG and CT  cervical spine, but otherwise will be available on an as-needed basis.  Remainder of the work-up can be followed up by primary team/PCP/outpatient neurologist  Lesleigh Noe MD-PhD [facility] [phone]

## 2022-05-28 ENCOUNTER — Observation Stay (HOSPITAL_BASED_OUTPATIENT_CLINIC_OR_DEPARTMENT_OTHER): Payer: Medicare Other

## 2022-05-28 ENCOUNTER — Observation Stay (HOSPITAL_COMMUNITY): Payer: Medicare Other

## 2022-05-28 DIAGNOSIS — G459 Transient cerebral ischemic attack, unspecified: Secondary | ICD-10-CM

## 2022-05-28 DIAGNOSIS — R2990 Unspecified symptoms and signs involving the nervous system: Secondary | ICD-10-CM

## 2022-05-28 DIAGNOSIS — R270 Ataxia, unspecified: Secondary | ICD-10-CM

## 2022-05-28 DIAGNOSIS — Z043 Encounter for examination and observation following other accident: Secondary | ICD-10-CM | POA: Diagnosis not present

## 2022-05-28 DIAGNOSIS — H532 Diplopia: Secondary | ICD-10-CM | POA: Diagnosis not present

## 2022-05-28 LAB — T4, FREE: Free T4: 0.76 ng/dL (ref 0.61–1.12)

## 2022-05-28 LAB — ECHOCARDIOGRAM COMPLETE
AR max vel: 2.45 cm2
AV Area VTI: 2.64 cm2
AV Area mean vel: 2.45 cm2
AV Mean grad: 3 mmHg
AV Peak grad: 5.6 mmHg
Ao pk vel: 1.18 m/s
Area-P 1/2: 3.53 cm2
Height: 61.25 in
S' Lateral: 2.7 cm
Weight: 2320 oz

## 2022-05-28 LAB — RPR: RPR Ser Ql: NONREACTIVE

## 2022-05-28 LAB — HEMOGLOBIN A1C
Hgb A1c MFr Bld: 6.1 % — ABNORMAL HIGH (ref 4.8–5.6)
Mean Plasma Glucose: 128.37 mg/dL

## 2022-05-28 LAB — SODIUM, URINE, RANDOM: Sodium, Ur: 98 mmol/L

## 2022-05-28 LAB — GLUCOSE, CAPILLARY
Glucose-Capillary: 116 mg/dL — ABNORMAL HIGH (ref 70–99)
Glucose-Capillary: 139 mg/dL — ABNORMAL HIGH (ref 70–99)
Glucose-Capillary: 126 mg/dL — ABNORMAL HIGH (ref 70–99)
Glucose-Capillary: 158 mg/dL — ABNORMAL HIGH (ref 70–99)

## 2022-05-28 LAB — RAPID URINE DRUG SCREEN, HOSP PERFORMED
Amphetamines: POSITIVE — AB
Barbiturates: NOT DETECTED
Benzodiazepines: NOT DETECTED
Cocaine: NOT DETECTED
Opiates: NOT DETECTED
Tetrahydrocannabinol: NOT DETECTED

## 2022-05-28 LAB — AMMONIA: Ammonia: 27 umol/L (ref 9–35)

## 2022-05-28 LAB — VITAMIN B12: Vitamin B-12: 276 pg/mL (ref 180–914)

## 2022-05-28 LAB — HIV ANTIBODY (ROUTINE TESTING W REFLEX): HIV Screen 4th Generation wRfx: NONREACTIVE

## 2022-05-28 LAB — CREATININE, URINE, RANDOM: Creatinine, Urine: 62 mg/dL

## 2022-05-28 MED ORDER — MELATONIN 3 MG PO TABS
3.0000 mg | ORAL_TABLET | Freq: Every day | ORAL | Status: DC
  Administered 2022-05-28 – 2022-05-29 (×2): 3 mg via ORAL
  Filled 2022-05-28 (×2): qty 1

## 2022-05-28 MED ORDER — NIACIN 100 MG PO TABS
100.0000 mg | ORAL_TABLET | Freq: Two times a day (BID) | ORAL | Status: DC
  Administered 2022-05-28 – 2022-05-30 (×5): 100 mg via ORAL
  Filled 2022-05-28 (×6): qty 1

## 2022-05-28 MED ORDER — VITAMIN B-12 1000 MCG PO TABS
1000.0000 ug | ORAL_TABLET | Freq: Every day | ORAL | Status: DC
  Administered 2022-05-29 – 2022-05-30 (×2): 1000 ug via ORAL
  Filled 2022-05-28 (×2): qty 1

## 2022-05-28 MED ORDER — CYANOCOBALAMIN 1000 MCG/ML IJ SOLN
1000.0000 ug | Freq: Once | INTRAMUSCULAR | Status: DC
  Filled 2022-05-28: qty 1

## 2022-05-28 MED ORDER — ENSURE ENLIVE PO LIQD
237.0000 mL | ORAL | Status: DC
Start: 2022-05-28 — End: 2022-05-30
  Administered 2022-05-28 – 2022-05-29 (×2): 237 mL via ORAL

## 2022-05-28 MED ORDER — EZETIMIBE 10 MG PO TABS
10.0000 mg | ORAL_TABLET | Freq: Every day | ORAL | Status: DC
Start: 2022-05-28 — End: 2022-05-30
  Administered 2022-05-28 – 2022-05-30 (×3): 10 mg via ORAL
  Filled 2022-05-28 (×3): qty 1

## 2022-05-28 MED ORDER — ADULT MULTIVITAMIN W/MINERALS CH
1.0000 | ORAL_TABLET | Freq: Every day | ORAL | Status: DC
  Administered 2022-05-28 – 2022-05-30 (×3): 1 via ORAL
  Filled 2022-05-28 (×3): qty 1

## 2022-05-28 MED ORDER — MAGNESIUM SULFATE 4 GM/100ML IV SOLN
4.0000 g | Freq: Once | INTRAVENOUS | Status: AC
  Administered 2022-05-28: 4 g via INTRAVENOUS
  Filled 2022-05-28: qty 100

## 2022-05-28 NOTE — Progress Notes (Signed)
Pt expressed she is upset about her monitor going off and wasn't able to sleep.she wanted her monitor as well as her bed alarm and iv pump to be completely off,pt was educated about the importance of it,but she is non compliant about it.she refused her prn meds ATARAX po.MD.D[place] made aware of situation.

## 2022-05-28 NOTE — Progress Notes (Addendum)
Pt still non compliant,she even refused her labs and her morning CBG.Pt don't want to put on a band for FALL and DNR.Refused to give a urine sample .She stated that she doesn't wanted to be bothered.

## 2022-05-28 NOTE — Progress Notes (Signed)
Per RN, patient is agitated and does not want to be bothered. RN said she had to turn off the vital sign monitor in the room because patient was complaining about it. Stated that they have gotten pushback from pt all night. Will leave EEG to be placed in the morning.

## 2022-05-28 NOTE — Progress Notes (Signed)
EEG complete - results pending 

## 2022-05-28 NOTE — Assessment & Plan Note (Signed)
Seen by Neurology their recs  : -Pacemaker interrogation (Please call cardiac pacemaker tech in a.m. for interrogation)   -Given her pacemaker is not MRI compatible, will obtain CT C-spine ( done unchanged)  -A1c, lipid panel, TSH, B12, MMA, thiamine, ammonia, RPR, HIV -EEG, routine -Agree with T4 testing given elevated TSH -Consider tapering hydroxyzine or gabapentin gradually on an outpatient basis -Would avoid tapering nortriptyline for now due to her psychiatric history -Minimize opiates -Agree with single-fiber nerve conduction study planned for myasthenia gravis testing -Consider outpatient neurocognitive evaluation and driving evaluation to confirm safety -PT/OT evaluation -Neurology will follow-up EEG and MRI brain and cervical spine, but otherwise will be available on an as-needed basis. Remainder of the work-up can be followed up by primary team/PCP/outpatient neurologist

## 2022-05-28 NOTE — Progress Notes (Signed)
Pt still non compliant with cardiac monitor and her prn meds.

## 2022-05-28 NOTE — Evaluation (Signed)
Speech Language Pathology Evaluation Patient Details Name: Heidi [name] MRN: [id number] DOB: 07-12-1941 Today's Date: 05/28/2022 Time: 1440-1455 SLP Time Calculation (min) (ACUTE ONLY): 15 min  Problem List:  Patient Active Problem List   Diagnosis Date Noted   Hypomagnesemia 05/28/2022   Ataxia 05/28/2022   Falls, initial encounter 05/27/2022   TIA (transient ischemic attack) 05/26/2022   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   HLD (hyperlipidemia) 05/30/2017   GERD (gastroesophageal reflux disease) 05/30/2017   UTI (urinary tract infection) 64/68/0321   Acute metabolic encephalopathy 22/48/2500   Type II diabetes mellitus with renal manifestations ([place]) 05/30/2017   Hypothyroidism 05/30/2017   Depression 05/30/2017   CAD (coronary artery disease) 05/30/2017   CKD (chronic kidney disease), stage III (HCC) 05/30/2017   Chronic diastolic CHF (congestive heart failure) ([place]) 05/30/2017   Dementia with behavioral disturbance ([place]) 05/30/2017   Essential hypertension, benign 06/15/2014   Allergic reaction 04/28/2012   Tachycardia-bradycardia ([place]) 12/24/2011   Pacemaker-mdt 12/24/2011   HTN (hypertension) 12/24/2011   A-fib ([place]) 11/07/2011   Past Medical History:  Past Medical History:  Diagnosis Date   Anxiety    Atrial fibrillation (HCC)    Intermittent   CAD (coronary artery disease)    non obstructive   CKD (chronic kidney disease)    Diabetes mellitus without complication ([place])    Dysrhythmia    PAF   GERD (gastroesophageal reflux disease)    Glaucoma    [place]   Heart murmur    HTN (hypertension)    Hyperlipidemia    Hypothyroid    Major depression    10-17-2010: hospitalized for suicidal, [name] (D/C 10-30-2010)   Migraine headache    OSA (obstructive sleep apnea)    CPAP nightly   Peripheral neuropathy    Presence of permanent cardiac pacemaker    Seasonal allergies    Sleep apnea     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    ABDOMINAL HYSTERECTOMY  1976   APPENDECTOMY     BREAST EXCISIONAL BIOPSY     BREAST SURGERY     fibroids removed, benign   CARDIAC CATHETERIZATION  02-12-2006   Minor irregularities: RCA-mid 40%, LM-normal, LAD-normal. EF 65%.   CHOLECYSTECTOMY     ECTOPIC PREGNANCY SURGERY     treatment x3, last one 11-02-2009 at [facility]   goiter     goiter removed     MASS EXCISION Right 10/18/2015   Procedure: EXCISION OF 6CM MASS ON RIGHT  NECK/BACK;  Surgeon: [name], MD;  Location: [place];  Service: General;  Laterality: Right;   PACEMAKER GENERATOR CHANGE N/A 01/15/2012   Procedure: PACEMAKER GENERATOR CHANGE;  Surgeon: Deboraha Sprang, MD;  Location: [facility] CATH LAB;  Service: Cardiovascular;  Laterality: N/A;   PACEMAKER INSERTION     Permanent. [employer] EnRhyth 04/2006, set as DDDR   REVERSE SHOULDER ARTHROPLASTY Right 05/01/2022   Procedure: REVERSE SHOULDER ARTHROPLASTY;  Surgeon: Hiram Gash, MD;  Location: [facility] ORS;  Service: Orthopedics;  Laterality: Right;   TUMOR REMOVAL  2000   Fibroid from breast x4-most recent   WRIST FRACTURE SURGERY  11-2007   right, had metal plate inserted   HPI:  Patient is an 81 y.o. female with PMH: HTN, HLD,  CAD, CKD, OSA, mild dementia, GERD, glaucoma, a-fib. She presented to hospital from her ALF on 05/08/22 due to multiple falls and diplopia. CT head reported no evidence of acute infarct, hemorrhage, mass, mass effect,  or midline shift. She had a 05/01/22 R revers shoulder arthoplasty .   Assessment / Plan / Recommendation Clinical Impression  Patient presents with mild-moderate cognitive impairment as per this evaluation. She does have a h/o mild dementia listed in her chart and  although patient denies any memory deficits, her daughter confirms this. Daughter said that staff at patient's ALF do what they can for patient but they are concerned about the multiple falls she has been having. Daughter is concerned as well and she thinks patient needs SNF level care. Patient was pleasant and although she exhibited occasional, brief errors in verbal production and/or word finding errors, her main deficit appears to be her memory. She was not able to recall that a doctor had been in the room despite having had multiple doctors in room today. She had difficulty describing events from PT evaluation that had just been done prior to SLP entering room. She participated in the SLUMS exam and received a score of 20 our of possible 30, placing her in scoring category for 'Dementia' (scores in range 1-20). SLP is recommending skilled services at next venue of care (likely SNF) but as she has h/o dementia, not recommending acute level SLP services at this time.    SLP Assessment  SLP Recommendation/Assessment: All further Speech Lanaguage Pathology  needs can be addressed in the next venue of care SLP Visit Diagnosis: Cognitive communication deficit (R41.841)    Recommendations for follow up therapy are one component of a multi-disciplinary discharge planning process, led by the attending physician.  Recommendations may be updated based on patient status, additional functional criteria and insurance authorization.    Follow Up Recommendations  Skilled nursing-short term rehab (<3 hours/day)    Assistance Recommended at Discharge  Frequent or constant Supervision/Assistance  Functional Status Assessment Patient has had a recent decline in their functional status and demonstrates the ability to make significant improvements in function in a reasonable and predictable amount of time.  Frequency and Duration     N/A      SLP Evaluation Cognition  Overall Cognitive Status: Difficult to  assess Arousal/Alertness: Awake/alert Orientation Level: Oriented X4 Year: 2023 Month: August Day of Week: Incorrect Attention: Sustained Sustained Attention: Impaired Sustained Attention Impairment: Verbal complex Memory: Impaired Memory Impairment: Storage deficit;Retrieval deficit;Decreased recall of new information Awareness: Impaired Awareness Impairment: Emergent impairment;Intellectual impairment Problem Solving: Impaired Problem Solving Impairment: Verbal complex Safety/Judgment: Impaired       Comprehension  Auditory Comprehension Overall Auditory Comprehension: Appears within functional limits for tasks assessed    Expression Expression Primary Mode of Expression: Verbal Verbal Expression Overall Verbal Expression: Other (comment) Pragmatics: Impairment Impairments: Topic maintenance Non-Verbal Means of Communication: Not applicable Written Expression Dominant Hand: Right   Oral / Motor  Oral Motor/Sensory Function Overall Oral Motor/Sensory Function: Within functional limits           Sonia Baller, MA, CCC-SLP Speech Therapy

## 2022-05-28 NOTE — Evaluation (Signed)
Occupational Therapy Evaluation Patient Details Name: Heidi [name] MRN: [id number] DOB: April 22, 1941 Today's Date: 05/28/2022   History of Present Illness Pt is a 81 yr old female who presented 05/26/22 due to multiple falls and diplopia Pt pending MRI. Pt did have on  05/01/22 R revers shoulder arthoplasty  and per chart of to come out of sling for RW use with PWB. PMH: HTN, HLD, depression, GERD, glaucoma, atrial fibrillation not on anticoagulation, pacemaker CAD, CKD, OSA , mild dementia   Clinical Impression   Pt reported living at ALF setting but recently was needing increase in assistance and requiring a wheelchair for oong distance mobility in the community. Pt when in a sitting position was able to complete Ue with min guard to min assist due to recent RUE shoulder sx and min guard for LE ADLs with lateral lean. Pt however with only about 8 ft of ambulation with FW reported feeling very fatigued and required to rest. Pt currently with functional limitations due to the deficits listed below (see OT Problem List).  Pt [name] benefit from skilled OT to increase their safety and independence with ADL and functional mobility for ADL to facilitate discharge to venue listed below.    BP readings: Supine: 131/69 Sitting: 145/80 Standing: 136/117 Standing for 2 mins: 163/88 Post ambulation room level: 137/85     Recommendations for follow up therapy are one component of a multi-disciplinary discharge planning process, led by the attending physician.  Recommendations may be updated based on patient status, additional functional criteria and insurance authorization.   Follow Up Recommendations  Skilled nursing-short term rehab (<3 hours/day) vs HH depending on progression with ambulation/standing tasks   Assistance Recommended at Discharge Frequent or constant Supervision/Assistance  Patient can return home with the following A little help with walking and/or transfers;A little help with  bathing/dressing/bathroom;Assistance with cooking/housework;Assistance with feeding;Direct supervision/assist for medications management;Direct supervision/assist for financial management;Assist for transportation    Functional Status Assessment  Patient has had a recent decline in their functional status and demonstrates the ability to make significant improvements in function in a reasonable and predictable amount of time.  Equipment Recommendations  None recommended by OT    Recommendations for Other Services       Precautions / Restrictions Precautions Precautions: Fall Precaution Comments: multiple falls Restrictions Weight Bearing Restrictions: No      Mobility Bed Mobility Overal bed mobility: Needs Assistance Bed Mobility: Supine to Sit     Supine to sit: Modified independent (Device/Increase time)          Transfers Overall transfer level: Needs assistance Equipment used: Rolling walker (2 wheels) Transfers: Sit to/from Stand Sit to Stand: Min guard                  Balance Overall balance assessment: Needs assistance Sitting-balance support: Feet supported Sitting balance-Leahy Scale: Fair Sitting balance - Comments: Pt due to dizziness needed supervision to CGA   Standing balance support: Bilateral upper extremity supported, Reliant on assistive device for balance, During functional activity Standing balance-Leahy Scale: Poor                             ADL either performed or assessed with clinical judgement   ADL Overall ADL's : Needs assistance/impaired Eating/Feeding: Independent;Sitting   Grooming: Set up;Sitting   Upper Body Bathing: Cueing for safety;Cueing for sequencing;Sitting;Minimal assistance   Lower Body Bathing: Cueing for safety;Cueing for sequencing;Sit to/from stand;Min guard  Upper Body Dressing : Min guard;Cueing for safety;Cueing for sequencing;Sitting   Lower Body Dressing: Min guard   Toilet Transfer:  Min guard;Cueing for safety;Cueing for sequencing;Rolling walker (2 wheels);BSC/3in1   Toileting- Clothing Manipulation and Hygiene: Minimal assistance;Cueing for safety;Cueing for sequencing;Sit to/from stand       Functional mobility during ADLs: Min guard;Rolling walker (2 wheels);Cueing for safety;Cueing for sequencing       Vision Baseline Vision/History: 1 Wears glasses Patient Visual Report: Diplopia;Other (comment) (Pt reported that when they do anything they get dizzy but noted in session if changing position to increase in complaints) Vision Assessment?: Vision impaired- to be further tested in functional context Additional Comments: Pt has reported there has been changes and ongoing     Perception     Praxis      Pertinent Vitals/Pain Pain Assessment Pain Assessment: Faces Faces Pain Scale: Hurts a little bit Pain Location: R hip due to fall Pain Descriptors / Indicators: Discomfort Pain Intervention(s): Limited activity within patient's tolerance, Monitored during session, Repositioned     Hand Dominance Right   Extremity/Trunk Assessment Upper Extremity Assessment Upper Extremity Assessment: RUE deficits/detail RUE Deficits / Details: pt had a R reverse shoulder arthroplaty on 05/01/22 and was working on table top activities RUE Sensation: WNL RUE Coordination: decreased gross motor   Lower Extremity Assessment Lower Extremity Assessment: Defer to PT evaluation   Cervical / Trunk Assessment Cervical / Trunk Assessment: Kyphotic   Communication Communication Communication: Other (comment) (Noted at sometimes they had difficulties with expressive language)   Cognition Arousal/Alertness: Awake/alert Behavior During Therapy: WFL for tasks assessed/performed Overall Cognitive Status: Within Functional Limits for tasks assessed                                       General Comments       Exercises Exercises: General Upper  Extremity General Exercises - Upper Extremity Shoulder Flexion: AAROM, 5 reps, Right, Supine Elbow Flexion: AROM, Right, 5 reps   Shoulder Instructions      Home Living Family/patient expects to be discharged to:: Assisted living                             Home Equipment: Shower seat;Grab bars - tub/shower;Grab bars - toilet;Rollator (4 wheels);Wheelchair - manual;Hand held shower head          Prior Functioning/Environment Prior Level of Function : Needs assist       Physical Assist : Mobility (physical) Mobility (physical): Gait   Mobility Comments: Pt reported that they were using a Rw but due to falls they were being transported vis WC level          OT Problem List: Decreased strength;Decreased activity tolerance;Impaired balance (sitting and/or standing);Decreased safety awareness;Decreased knowledge of use of DME or AE;Cardiopulmonary status limiting activity;Pain      OT Treatment/Interventions: Self-care/ADL training;Therapeutic exercise;DME and/or AE instruction;Therapeutic activities;Patient/family education;Balance training    OT Goals(Current goals can be found in the care plan section) Acute Rehab OT Goals Patient Stated Goal: To get rest OT Goal Formulation: With patient Time For Goal Achievement: 06/11/22 Potential to Achieve Goals: Good ADL Goals Pt [name] Perform Grooming: with set-up;standing Pt [name] Perform Upper Body Bathing: with set-up;sitting Pt [name] Perform Lower Body Bathing: with modified independence;sit to/from stand Pt [name] Transfer to Toilet: with modified independence;ambulating;grab bars;regular height toilet  OT  Frequency: Min 2X/week    Co-evaluation              AM-PAC OT "6 Clicks" Daily Activity     Outcome Measure Help from another person eating meals?: None Help from another person taking care of personal grooming?: A Little Help from another person toileting, which includes using toliet, bedpan, or urinal?:  A Little Help from another person bathing (including washing, rinsing, drying)?: A Lot Help from another person to put on and taking off regular upper body clothing?: A Little Help from another person to put on and taking off regular lower body clothing?: A Little 6 Click Score: 18   End of Session Equipment Utilized During Treatment: Gait belt;Rolling walker (2 wheels) Nurse Communication: Mobility status (bp/dizziness with mobility)  Activity Tolerance: Patient tolerated treatment well Patient left: in chair;with call bell/phone within reach;with chair alarm set (EEG tech present)  OT Visit Diagnosis: Unsteadiness on feet (R26.81);Other abnormalities of gait and mobility (R26.89);Repeated falls (R29.6);Muscle weakness (generalized) (M62.81);History of falling (Z91.81);Dizziness and giddiness (R42);Pain Pain - Right/Left: Right Pain - part of body: Hip                Time: 2863-8177 OT Time Calculation (min): 38 min Charges:  OT General Charges $OT Visit: 1 Visit OT Evaluation $OT Eval Low Complexity: 1 Low OT Treatments $Self Care/Home Management : 8-22 mins  Joeseph Amor OTR/L  Acute Rehab Services  [phone] office number [phone] pager number   Joeseph Amor 05/28/2022, 8:56 AM

## 2022-05-28 NOTE — Progress Notes (Signed)
LTM EEG hooked up and running - no initial skin breakdown - push button tested - neuro notified. [facility] monitoring.  

## 2022-05-28 NOTE — Progress Notes (Addendum)
Pt is still upset,refused IV magnesium and cynocobalamine SQ,but took her Synthroid po.Still non compliant about cardiac monitor,labs.Refused EKG.MD made aware.

## 2022-05-28 NOTE — Progress Notes (Signed)
PROGRESS NOTE                                                                                                                                                                                                             Patient Demographics:    Heidi [name], is a 81 y.o. female, DOB - 04/24/1941, JQZ:[id number]  Outpatient Primary MD for the patient is Wenda Low, MD    LOS - 0  Admit date - 05/26/2022    Chief Complaint  Patient presents with   Fall   Dizziness       Brief Narrative (HPI from H&P)   81 y.o. 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  She presents to the hospital with some diplopia, history of falls and also some auditory hallucinations where she hears some music from time to time.   Subjective:    [name] today has, No headache, No chest pain, No abdominal pain - No Nausea, No new weakness tingling or numbness, no SOB, she is now eating [place] music playing in a years.  Says has been going on for a while.   Assessment  & Plan :    Diplopia, falls.  Question TIA.  Initial CT head and CTA did not show any acute changes.  Question if she had TIA or these are hallucinations, neuro team on board.  EEG negative, she has a pacemaker which is not compatible with MRI.  Any further repeat CT head or stroke related work-up will be deferred to neurology.  LDL was above goal hence she was placed on Zetia plus niacin along with fenofibrate, she has severe statin allergy, continue aspirin for secondary prevention.  Her diplopia symptoms have currently resolved.  Complete full TIA work-up.  PT-OT and speech to evaluate.  HX of depression and hallucinations in the past.  Now having auditory hallucinations of hearing music.  Will get psych input.    History of paroxysmal atrial fibrillation.  Mali vas 2 score of greater than 3.  Continue beta-blocker.  Not on anticoagulation due to multiple falls in the past.  Dyslipidemia.  Multiple severe statin allergies.  On fenofibrate which will be continued, Zetia and niacin added with meals.  Hypothyroidism.  On Synthroid continue.  Mildly elevated TSH likely due to sick euthyroid.  Hypomagnesemia.  Replaced.  Chronic diastolic EF.  Recent echo 5 months ago with preserved EF.  Repeat echo pending currently compensated.  Continue beta-blocker.  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  Mild dementia.  Supportive care at risk for delirium.  Newly found borderline B12 deficiency.  Replace here and continue replacement upon discharge.  DM type II.  SSI.  Lab Results  Component Value Date   HGBA1C 6.5 (H) 04/22/2022   CBG (last 3)  Recent Labs    05/26/22 1946 05/27/22 2042 05/28/22 0841  GLUCAP 96 106* 116*        Condition - Extremely Guarded  Family Communication  :  daughter baseline  Code Status :  Full  Consults  :  Neuro, Psych  PUD Prophylaxis :    Procedures  :     CT and CTA  Head - Non acute  EEG - Non acute  TTE      Disposition Plan  :    Status is: Observation  DVT Prophylaxis  :    SCDs Start: 05/27/22 1945    Lab Results  Component Value Date   PLT 267 05/27/2022    Diet :  Diet Order             Diet Carb Modified Fluid consistency: Thin; Room service appropriate? Yes  Diet effective now                    Inpatient Medications  Scheduled Meds:  aspirin  81 mg Oral BID   cyanocobalamin  1,000 mcg Subcutaneous Once   [START ON 05/29/2022] vitamin B-12  1,000 mcg Oral Daily   fenofibrate  160 mg Oral Daily   gabapentin  300 mg Oral TID   galantamine  8 mg Oral Daily   insulin aspart  0-9 Units Subcutaneous TID WC   levothyroxine  75 mcg Oral Q0600   melatonin  3 mg Oral QHS    metoprolol succinate  100 mg Oral Daily   nortriptyline  100 mg Oral QHS   senna  1 tablet Oral BID   Continuous Infusions:  magnesium sulfate bolus IVPB     PRN Meds:.acetaminophen **OR** acetaminophen, HYDROcodone-acetaminophen, hydrOXYzine, methocarbamol, polyethylene glycol, traMADol  Antibiotics  :    Anti-infectives (From admission, onward)    None        Time Spent in minutes  30   Lala Lund M.D on 05/28/2022 at 10:48 AM  To page go to [email or web address]   [facility] -  Office  [phone]  See all Orders from today for further details    Objective:   Vitals:   05/27/22 2345 05/28/22 0030 05/28/22 0825 05/28/22 0912  BP: (!) 139/58 123/70 137/85   Pulse:  73 84   Resp: '17  20 20  '$ Temp: 98 F (36.7 C) 97.9 F (36.6 C)    TempSrc: Oral Oral Oral   SpO2: 96% 95% 97%   Weight:      Height:        Wt Readings from Last 3 Encounters:  05/26/22 65.8 kg  05/01/22 63.5 kg  04/22/22 63.5 kg     Intake/Output Summary (Last 24 hours) at 05/28/2022 1048 Last data filed at 05/27/2022 2000 Gross per 24 hour  Intake --  Output 200 ml  Net -200 ml     Physical Exam  Awake Alert,  No new F.N deficits, flat affect [place].AT,PERRAL Supple Neck, No JVD,   Symmetrical Chest wall movement, Good air movement bilaterally, CTAB RRR,No Gallops,Rubs or new Murmurs,  +ve B.Sounds, Abd Soft, No tenderness,   No Cyanosis, Clubbing or edema       Data Review:    CBC Recent Labs  Lab 05/26/22 1832 05/27/22 1925  WBC 7.7 8.0  HGB 12.2 11.8*  HCT 38.0 36.4  PLT 283 267  MCV 89.4 87.5  MCH 28.7 28.4  MCHC 32.1 32.4  RDW 13.4 13.2  LYMPHSABS  --  3.0  MONOABS  --  0.5  EOSABS  --  0.8*  BASOSABS  --  0.0    Electrolytes Recent Labs  Lab 05/26/22 1832 05/27/22 1925  NA 135 137  K 4.9 4.9  CL 101 107  CO2 25 24  GLUCOSE 108* 123*  BUN 28* 18  CREATININE 1.18* 1.02*  CALCIUM 10.0 9.9  AST  --  18  ALT  --  18  ALKPHOS  --  93  BILITOT   --  0.5  ALBUMIN  --  3.6  MG  --  1.6*  INR  --  1.0  TSH  --  5.134*    ------------------------------------------------------------------------------------------------------------------ Recent Labs    05/27/22 2238  CHOL 206*  HDL 60  LDLCALC 112*  TRIG 171*  CHOLHDL 3.4    Lab Results  Component Value Date   HGBA1C 6.5 (H) 04/22/2022    Recent Labs    05/27/22 1925  TSH 5.134*   ------------------------------------------------------------------------------------------------------------------ ID Labs Recent Labs  Lab 05/26/22 1832 05/27/22 1925  WBC 7.7 8.0  PLT 283 267  CREATININE 1.18* 1.02*   Cardiac Enzymes No results for input(s): "CKMB", "TROPONINI", "MYOGLOBIN" in the last 168 hours.  Invalid input(s): "CK"   Micro Results No results found for this or any previous visit (from the past 240 hour(s)).  Radiology Reports EEG adult  Result Date: 05/28/2022 Lora Havens, MD     05/28/2022 10:33 AM Patient Name: BRAD MCGAUGHY MRN: [id number] Epilepsy Attending: Lora Havens Referring Physician/Provider: Lorenza Chick, MD Date: 05/28/2022 Duration: 24.35 mins Patient history: 81 year old female with dizziness.  EEG to evaluate for seizure. Level of alertness: Awake AEDs during EEG study: None Technical aspects: This EEG study was done with scalp electrodes positioned according to the 10-20 International system of electrode placement. Electrical activity was reviewed with band pass filter of 1-'70Hz'$ , sensitivity of 7 uV/mm, display speed of 13m/sec with a '60Hz'$  notched filter applied as appropriate. EEG data were recorded continuously and digitally stored.  Video monitoring was available and reviewed as appropriate. Description: The posterior dominant rhythm consists of 8 Hz activity of moderate voltage (25-35 uV) seen predominantly in posterior head regions, symmetric and reactive to eye opening and eye closing. Hyperventilation and photic stimulation were  not performed.   Of note, there was electrode artifact at F8. IMPRESSION: This study is within normal limits. No seizures or epileptiform discharges were seen throughout the recording. A normal interictal EEG does not exclude nor support the diagnosis of epilepsy. PLora Havens  CT C-SPINE NO CHARGE  Result Date: 05/28/2022 CLINICAL DATA:  Increased frequency of fall EXAM: CT CERVICAL SPINE WITHOUT CONTRAST TECHNIQUE: Multidetector CT imaging of the cervical spine was performed without intravenous contrast. Multiplanar CT image reconstructions were also generated. RADIATION DOSE REDUCTION: This exam was performed according to the departmental dose-optimization program which includes automated exposure control, adjustment of the mA  and/or kV according to patient size and/or use of iterative reconstruction technique. COMPARISON:  None Available. FINDINGS: Alignment: Normal. Skull base and vertebrae: No acute fracture. No primary bone lesion or focal pathologic process. Soft tissues and spinal canal: No prevertebral fluid or swelling. No visible canal hematoma. Disc levels:  Left C3-6 facet arthrosis.  No spinal canal stenosis. Upper chest: Negative Other: None. IMPRESSION: No acute fracture or static subluxation of the cervical spine. Electronically Signed   By: Ulyses Jarred M.D.   On: 05/28/2022 00:34   CT ANGIO HEAD NECK W WO CM  Result Date: 05/26/2022 CLINICAL DATA:  Diplopia, dizziness EXAM: CT ANGIOGRAPHY HEAD AND NECK TECHNIQUE: Multidetector CT imaging of the head and neck was performed using the standard protocol during bolus administration of intravenous contrast. Multiplanar CT image reconstructions and MIPs were obtained to evaluate the vascular anatomy. Carotid stenosis measurements (when applicable) are obtained utilizing NASCET criteria, using the distal internal carotid diameter as the denominator. RADIATION DOSE REDUCTION: This exam was performed according to the departmental  dose-optimization program which includes automated exposure control, adjustment of the mA and/or kV according to patient size and/or use of iterative reconstruction technique. CONTRAST:  71m OMNIPAQUE IOHEXOL 350 MG/ML SOLN COMPARISON:  12/25/2020 CTA head neck; correlation is also made with 01/07/2022 CT head FINDINGS: CT HEAD FINDINGS Brain: No evidence of acute infarct, hemorrhage, mass, mass effect, or midline shift. No hydrocephalus or extra-axial fluid collection. Periventricular white matter changes, likely the sequela of chronic small vessel ischemic disease. Vascular: No hyperdense vessel. Skull: Normal. Negative for fracture or focal lesion. Sinuses/Orbits: No acute finding. Status post bilateral lens replacements. Other: The mastoid air cells are well aerated. CTA NECK FINDINGS Aortic arch: Standard branching. Imaged portion shows no evidence of aneurysm or dissection. Aortic atherosclerosis. Approximately 40% stenosis of the proximal left subclavian secondary to calcified plaque (series 11, image 274). Right carotid system: Approximately 60% stenosis in the proximal right ICA, which appears slightly worse than in 2022. No evidence of dissection or occlusion. Left carotid system: Less than 50% stenosis in the proximal left ICA, similar to prior. No evidence of dissection or occlusion Vertebral arteries: No evidence of dissection, occlusion, or hemodynamically significant stenosis (greater than 50%). Skeleton: No acute osseous abnormality. Degenerative changes in the cervical spine. Other neck: Multiple subcentimeter hypoattenuating nodules in the thyroid, for which no follow-up is indicated. (Reference: J Am Coll Radiol. 2015 Feb;12(2): 143-50) Upper chest: Negative. Review of the MIP images confirms the above findings CTA HEAD FINDINGS Anterior circulation: Both internal carotid arteries are patent to the termini, with mild-to-moderate stenosis in the bilateral cavernous and supraclinoid ICAs, which  appears to have progressed from the prior exam. A1 segments patent. Normal anterior communicating artery. Anterior cerebral arteries are patent to their distal aspects. No M1 stenosis or occlusion. MCA branches perfused and symmetric. Posterior circulation: The right vertebral artery primarily supplies the right PICA. The left vertebral artery is patent to the vertebrobasilar junction. Posterior inferior cerebellar arteries patent proximally. Basilar patent to its distal aspect. Superior cerebellar arteries patent proximally. Fetal origin of the left PCA and near fetal origin of the right PCA, with a diminutive right P1 and prominent bilateral posterior communicating arteries. PCAs perfused to their distal aspects with moderate narrowing in the proximal right P2 section (series 11, images 109-110), similar to prior, and otherwise diffuse mild multifocal narrowing in the left-greater-than-right PCA. Venous sinuses: As permitted by contrast timing, patent. Anatomic variants: None significant. Review of the MIP images  confirms the above findings IMPRESSION: 1. No intracranial large vessel occlusion. Moderate narrowing in the proximal right P2, mild-to-moderate stenosis in the bilateral cavernous and supraclinoid ICAs, and mild multifocal narrowing in the left-greater-than-right PCA. 2. 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 3.  No acute intracranial process. Electronically Signed   By: Merilyn Baba M.D.   On: 05/26/2022 21:33   DG HIP UNILAT WITH PELVIS 2-3 VIEWS RIGHT  Result Date: 05/26/2022 CLINICAL DATA:  Hip pain.  Multiple falls. EXAM: DG HIP (WITH OR WITHOUT PELVIS) 2-3V RIGHT COMPARISON:  None Available. FINDINGS: No acute fracture of the pelvis or right hip. The femoral head is well seated in the acetabulum. Slight acetabular spurring. Intact pubic rami and bony pelvis. Pubic symphysis and sacroiliac joints are  congruent. No evidence of avascular necrosis or focal bone abnormality. IMPRESSION: No fracture of the pelvis or right hip. Electronically Signed   By: Keith Rake M.D.   On: 05/26/2022 21:18

## 2022-05-28 NOTE — Assessment & Plan Note (Signed)
Watch for any sign of sundowning.  Continue home medications Treat agitation as needed But would try to avoid oversedation in the daytime as this can contribute to falls. Likely will need higher level of care at the time of discharge

## 2022-05-28 NOTE — Consult Note (Signed)
[facility] Face-to-Face Psychiatry Consult   Reason for Consult:  History of depression; Auditory and visual hallucinations Referring Physician:  Dr. 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 female patient admitted with falls.    On initial interview by this psychiatric nurse practitioner, daughter is observed to be at bedside.  Patient was open to further psychiatric evaluation, and did allow her daughter to participate in psychiatric evaluation.  Patient is very well versed and provides descriptive detail in terms of her reported hallucinations.  She describes her auditory hallucinations as tinnitus.  Stating this is a chronic problem, and which she has had for years however has progressed in frequency and intensity.  She stated initially started as a tune " 4 beat tune", that turned into musical sounds.  She reports sometimes the sounds have words with him, other times she does not recognize the tunes.  She reports these musical tunes are not provoked by any factors that include isolation, quietness, depression, anxiety.  She does state these are persistent sounds and the absence of any of the factors listed above.  In regards to her visual hallucinations, she describes distortions that resolve after blinking of her eyes and or allowing time for her eyes to adjust.  She describes these as distorted images and symptoms it occurs when she moves her head too fast.  Patient  does endorse recent traumatic experience " fall", has me afraid of falling again.  She briefly describes her fall in April 2023 that resulted in extended length of stay, broken nose, bruised eyes, torn rotator cuff, and other medical issues that have resolved.  She does report her increase in falls have not been because of the hallucinations.  She is quite unsure what contributes to her number of falls, however her most recent fall Sunday was a trigger for her.  Furthermore every fall since April 2023 has been a trigger for her. Patient likely has ongoing delirium which presents as fluctuating cognition.  At time of this assessment patient has full capacity.  The patient's mental exam does not reflect altered sensorium, perceptual disturbances, disorientation, and or cognitive deficits that appear markedly different than her baseline.  Patient is able to complete cam ICU screening, 0 errors. She does not present with any of the above symptoms on this evaluation.  She further denies any depressive symptoms to include anhedonia, hopelessness, worthlessness, guilty, suicidal. Otherwise, the patient denies any symptoms of psychosis, paranoia, delusions, first rank symptoms, command hallucinations.  She does not appear to be displaying any or responding to internal stimuli, external stimuli, or exhibiting delusional thought disorder. There is no evidence of delusional thought content and patient appears to answer all questions appropriately.  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  She does have notable history for multiple psychiatric medications, treatment modalities, and therapies see below. Endorses prior multiple psychiatric hospitalization for depression with psychosis. Patient reports being placed on  Amitriptyline daily. Patient endorses adequate appetite and denies sleep disturbance.   Patient denies a history of  seizure, denies a history of SI/HI, and denies a history of suicide attempt. Patient denies family psychiatric history of substance use disorder and schizophrenia.    Patient reports currently lives alone in her apartment in an assisted living facility.  Her daughter is identified as her support system.. Denies a history of physical, emotional, verbal trauma or abuse.  However she does endorse falling as a traumatic event.  Denies history of legal issues, court dates, probation.  HPI:   81 y.o. 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  She presents to the hospital with some diplopia, history of falls and also some auditory hallucinations where she hears some music from time to time.  Past Psychiatric History: Major depressive disorder, with psychosis; dementia.  Past psychiatric medications include Paxil, Celexa, Abilify, Cymbalta, Wellbutrin, Zoloft, Viibryd, mirtazapine.  Patient reports history of ECT x 2; last treatment in January 2012 which she reports was ineffective for her depression.  Patient denies history of suicide attempts.  She denies all substance abuse, illicit substances, and was synthetic substances.  Patient reports previous psychiatric hospitalization in January, March, May, and June 2012 at [facility], 25+ years ago at [facility].  Risk to Self:  Denies Risk to Others:  Denies Prior Inpatient Therapy:   Multiple admission, last inpatient admission x 4 [facility] in 2012, [facility] >30 years ago.  Prior Outpatient Therapy:   [facility]  Past Medical History:  Past Medical History:  Diagnosis Date   Anxiety    Atrial fibrillation ([place])    Intermittent   CAD (coronary artery disease)    non obstructive   CKD (chronic kidney disease)    Diabetes mellitus without complication ([place])    Dysrhythmia    PAF   GERD  (gastroesophageal reflux disease)    Glaucoma    [place]   Heart murmur    HTN (hypertension)    Hyperlipidemia    Hypothyroid    Major depression    10-17-2010: hospitalized for suicidal, [facility], [place] (D/C 10-30-2010)   Migraine headache    OSA (obstructive sleep apnea)    CPAP nightly   Peripheral neuropathy    Presence of permanent cardiac pacemaker    Seasonal allergies    Sleep apnea    Tachy-brady syndrome ([place]) 04/06/2006   PPM placed    Past Surgical History:  Procedure Laterality Date   ABDOMINAL ADHESION SURGERY  2008   exploratory to remove attached to the abdominal wall, stomach and intestines.    ABDOMINAL HYSTERECTOMY  1976   APPENDECTOMY     BREAST EXCISIONAL BIOPSY     BREAST SURGERY     fibroids removed, benign   CARDIAC CATHETERIZATION  02-12-2006   Minor irregularities: RCA-mid 40%, LM-normal, LAD-normal. EF 65%.   CHOLECYSTECTOMY     ECTOPIC PREGNANCY SURGERY     treatment x3, last one 11-02-2009 at [facility]   goiter     goiter removed     MASS EXCISION Right 10/18/2015   Procedure: EXCISION OF 6CM MASS ON RIGHT  NECK/BACK;  Surgeon: [name], MD;  Location: [place];  Service: General;  Laterality: Right;   PACEMAKER GENERATOR CHANGE N/A 01/15/2012   Procedure: PACEMAKER GENERATOR CHANGE;  Surgeon: Deboraha Sprang,  MD;  Location: [place] CATH LAB;  Service: Cardiovascular;  Laterality: N/A;   PACEMAKER INSERTION     Permanent. [employer] EnRhyth 04/2006, set as DDDR   REVERSE SHOULDER ARTHROPLASTY Right 05/01/2022   Procedure: REVERSE SHOULDER ARTHROPLASTY;  Surgeon: Hiram Gash, MD;  Location: [facility];  Service: Orthopedics;  Laterality: Right;   TUMOR REMOVAL  2000   Fibroid from breast x4-most recent   WRIST FRACTURE SURGERY  11-2007   right, had metal plate inserted   Family History:  Family History  Problem Relation Age of Onset   Stomach cancer Father    Atrial fibrillation Brother    Family  Psychiatric  History: Denies family history of mental illness, substance abuse, suicide attempts/suicide completion, and or institutionalizations. Social History:  Social History   Substance and Sexual Activity  Alcohol Use No     Social History   Substance and Sexual Activity  Drug Use No    Social History   Socioeconomic History   Marital status: Widowed    Spouse name: Not on file   Number of children: Not on file   Years of education: Not on file   Highest education level: Not on file  Occupational History   Not on file  Tobacco Use   Smoking status: Former   Smokeless tobacco: Never  Vaping Use   Vaping Use: Never used  Substance and Sexual Activity   Alcohol use: No   Drug use: No   Sexual activity: Never  Other Topics Concern   Not on file  Social History Narrative   Right Handed    Lives in a one story home   Social Determinants of Health   Financial Resource Strain: Not on file  Food Insecurity: Not on file  Transportation Needs: Not on file  Physical Activity: Not on file  Stress: Not on file  Social Connections: Not on file   Additional Social History:    Allergies:   Allergies  Allergen Reactions   Crestor [Rosuvastatin Calcium] Other (See Comments)    Caused Myalgias   Effexor [Venlafaxine Hydrochloride] Other (See Comments) and Hypertension    Caused BP to go up   Aricept [Donepezil]     Other reaction(s): diarrhea   Latex     Other reaction(s): rash   Nardil [Phenelzine]     Other reaction(s): HTN   Venlafaxine     BP problems Other reaction(s): Unkown   Atorvastatin Rash    Other reaction(s): rash   Clopidogrel Rash   Hibiclens [Chlorhexidine Gluconate] Hives   Lipitor [Atorvastatin Calcium] Rash   Lisinopril Rash    Other reaction(s): rash   Nardil Other (See Comments)    Caused BP Problems   Penicillins Swelling and Other (See Comments)    Tolerated Cephalosporin Date: 05/01/22     Plavix [Clopidogrel Bisulfate] Rash    Simvastatin Rash    Other reaction(s): rash   Sulfa Antibiotics Rash   Sulfonamide Derivatives Rash   Tape Rash    Other reaction(s): rash    Labs:  Results for orders placed or performed during the hospital encounter of 05/26/22 (from the past 48 hour(s))  Urinalysis, Routine w reflex microscopic     Status: Abnormal   Collection Time: 05/26/22  6:20 PM  Result Value Ref Range   Color, Urine COLORLESS (A) YELLOW   APPearance CLEAR CLEAR   Specific Gravity, Urine 1.011 1.005 - 1.030   pH 5.5 5.0 - 8.0   Glucose, UA NEGATIVE NEGATIVE mg/dL  Hgb urine dipstick NEGATIVE NEGATIVE   Bilirubin Urine NEGATIVE NEGATIVE   Ketones, ur NEGATIVE NEGATIVE mg/dL   Protein, ur NEGATIVE NEGATIVE mg/dL   Nitrite NEGATIVE NEGATIVE   Leukocytes,Ua NEGATIVE NEGATIVE    Comment: Performed at [facility], [address] [place], [place] [address]  Basic metabolic panel     Status: Abnormal   Collection Time: 05/26/22  6:32 PM  Result Value Ref Range   Sodium 135 135 - 145 mmol/L   Potassium 4.9 3.5 - 5.1 mmol/L   Chloride 101 98 - 111 mmol/L   CO2 25 22 - 32 mmol/L   Glucose, Bld 108 (H) 70 - 99 mg/dL    Comment: Glucose reference range applies only to samples taken after fasting for at least 8 hours.   BUN 28 (H) 8 - 23 mg/dL   Creatinine, Ser 1.18 (H) 0.44 - 1.00 mg/dL   Calcium 10.0 8.9 - 10.3 mg/dL   GFR, Estimated 47 (L) >60 mL/min    Comment: (NOTE) Calculated using the CKD-EPI Creatinine Equation (2021)    Anion gap 9 5 - 15    Comment: Performed at [facility], [address], [place], [place] [address]  CBC     Status: None   Collection Time: 05/26/22  6:32 PM  Result Value Ref Range   WBC 7.7 4.0 - 10.5 K/uL   RBC 4.25 3.87 - 5.11 MIL/uL   Hemoglobin 12.2 12.0 - 15.0 g/dL   HCT 38.0 36.0 - 46.0 %   MCV 89.4 80.0 - 100.0 fL   MCH 28.7 26.0 - 34.0 pg   MCHC 32.1 30.0 - 36.0 g/dL   RDW 13.4 11.5 - 15.5 %   Platelets 283 150 - 400 K/uL    nRBC 0.0 0.0 - 0.2 %    Comment: Performed at [facility], [address] [place], [place] [address]  CBG monitoring, ED     Status: Abnormal   Collection Time: 05/26/22  6:34 PM  Result Value Ref Range   Glucose-Capillary 112 (H) 70 - 99 mg/dL    Comment: Glucose reference range applies only to samples taken after fasting for at least 8 hours.  CBG monitoring, ED     Status: None   Collection Time: 05/26/22  7:46 PM  Result Value Ref Range   Glucose-Capillary 96 70 - 99 mg/dL    Comment: Glucose reference range applies only to samples taken after fasting for at least 8 hours.  Sodium, urine, random     Status: None   Collection Time: 05/27/22  7:53 AM  Result Value Ref Range   Sodium, Ur 98 mmol/L    Comment: Performed at [facility] [address] [place], [place] [address]  Creatinine, urine, random     Status: None   Collection Time: 05/27/22  7:53 AM  Result Value Ref Range   Creatinine, Urine 62 mg/dL    Comment: Performed at [facility] [address] [place], [place] [address]  Comprehensive metabolic panel     Status: Abnormal   Collection Time: 05/27/22  7:25 PM  Result Value Ref Range   Sodium 137 135 - 145 mmol/L   Potassium 4.9 3.5 - 5.1 mmol/L   Chloride 107 98 - 111 mmol/L   CO2 24 22 - 32 mmol/L   Glucose, Bld 123 (H) 70 - 99 mg/dL    Comment: Glucose reference range applies only to samples taken after fasting for at least  8 hours.   BUN 18 8 - 23 mg/dL   Creatinine, Ser 1.02 (H) 0.44 - 1.00 mg/dL   Calcium 9.9 8.9 - 10.3 mg/dL   Total Protein 6.3 (L) 6.5 - 8.1 g/dL   Albumin 3.6 3.5 - 5.0 g/dL   AST 18 15 - 41 U/L   ALT 18 0 - 44 U/L   Alkaline Phosphatase 93 38 - 126 U/L   Total Bilirubin 0.5 0.3 - 1.2 mg/dL   GFR, Estimated 55 (L) >60 mL/min    Comment: (NOTE) Calculated using the CKD-EPI Creatinine Equation (2021)    Anion gap 6 5 - 15    Comment: Performed at [facility] [address]  [place], [place] [address]  CBC with Differential/Platelet     Status: Abnormal   Collection Time: 05/27/22  7:25 PM  Result Value Ref Range   WBC 8.0 4.0 - 10.5 K/uL   RBC 4.16 3.87 - 5.11 MIL/uL   Hemoglobin 11.8 (L) 12.0 - 15.0 g/dL   HCT 36.4 36.0 - 46.0 %   MCV 87.5 80.0 - 100.0 fL   MCH 28.4 26.0 - 34.0 pg   MCHC 32.4 30.0 - 36.0 g/dL   RDW 13.2 11.5 - 15.5 %   Platelets 267 150 - 400 K/uL   nRBC 0.0 0.0 - 0.2 %   Neutrophils Relative % 46 %   Neutro Abs 3.7 1.7 - 7.7 K/uL   Lymphocytes Relative 37 %   Lymphs Abs 3.0 0.7 - 4.0 K/uL   Monocytes Relative 6 %   Monocytes Absolute 0.5 0.1 - 1.0 K/uL   Eosinophils Relative 10 %   Eosinophils Absolute 0.8 (H) 0.0 - 0.5 K/uL   Basophils Relative 1 %   Basophils Absolute 0.0 0.0 - 0.1 K/uL   Immature Granulocytes 0 %   Abs Immature Granulocytes 0.03 0.00 - 0.07 K/uL    Comment: Performed at [facility] [address], [place], [place] [address]  Protime-INR     Status: None   Collection Time: 05/27/22  7:25 PM  Result Value Ref Range   Prothrombin Time 13.1 11.4 - 15.2 seconds   INR 1.0 0.8 - 1.2    Comment: (NOTE) INR goal varies based on device and disease states. Performed at [facility], [place] [address] [place], [place] [address]   TSH     Status: Abnormal   Collection Time: 05/27/22  7:25 PM  Result Value Ref Range   TSH 5.134 (H) 0.350 - 4.500 uIU/mL    Comment: Performed by a 3rd Generation assay with a functional sensitivity of <=0.01 uIU/mL. Performed at [facility], [place] [address] [place], [place] [address]   CK     Status: Abnormal   Collection Time: 05/27/22  7:25 PM  Result Value Ref Range   Total CK 29 (L) 38 - 234 U/L    Comment: Performed at [facility], [place] [address], [place], [place] [address]  Magnesium     Status: Abnormal   Collection Time: 05/27/22  7:25 PM  Result Value Ref Range   Magnesium 1.6 (L) 1.7 - 2.4 mg/dL    Comment: Performed at [facility] [address], [place], [place] [address]  Phosphorus     Status: None   Collection Time: 05/27/22  7:25 PM  Result Value Ref Range   Phosphorus 2.9 2.5 - 4.6 mg/dL    Comment: Performed at [facility], [place] [address], [place], [place] [address]  Glucose, capillary     Status: Abnormal   Collection Time: 05/27/22  8:42 PM  Result Value Ref Range   Glucose-Capillary 106 (H) 70 - 99 mg/dL    Comment: Glucose reference range applies only to samples taken after fasting for at least 8 hours.  T4, free     Status: None   Collection Time: 05/27/22 10:38 PM  Result Value Ref Range   Free T4 0.76 0.61 - 1.12 ng/dL    Comment: (NOTE) Biotin ingestion may interfere with free T4 tests. If the results are inconsistent with the TSH level, previous test results, or the clinical presentation, then consider biotin interference. If needed, order repeat testing after stopping biotin. Performed at [facility], [place] [address], [place], [place] [address]   Lipid panel     Status: Abnormal   Collection Time: 05/27/22 10:38 PM  Result Value Ref Range   Cholesterol 206 (H) 0 - 200 mg/dL   Triglycerides 171 (H) <150 mg/dL   HDL 60 >40 mg/dL   Total CHOL/HDL Ratio 3.4 RATIO   VLDL 34 0 - 40 mg/dL   LDL Cholesterol 112 (H) 0 - 99 mg/dL    Comment:        Total Cholesterol/HDL:CHD Risk Coronary Heart Disease Risk Table                     Men   Women  1/2 Average Risk   3.4   3.3  Average Risk       5.0   4.4  2 X Average Risk   9.6   7.1  3 X Average Risk  23.4   11.0        Use the calculated Patient Ratio above and the CHD Risk Table to determine the patient's CHD Risk.        ATP III CLASSIFICATION (LDL):  <100     mg/dL   Optimal  100-129  mg/dL   Near or Above                    Optimal  130-159  mg/dL   Borderline  160-189  mg/dL   High  >190     mg/dL   Very High Performed at [facility] [address], [place], [place] [address]   Vitamin B12     Status: None    Collection Time: 05/27/22 10:38 PM  Result Value Ref Range   Vitamin B-12 276 180 - 914 pg/mL    Comment: (NOTE) This assay is not validated for testing neonatal or myeloproliferative syndrome specimens for Vitamin B12 levels. Performed at [facility], [place] [address] [place], [place] [address]   RPR     Status: None   Collection Time: 05/28/22  3:18 AM  Result Value Ref Range   RPR Ser Ql NON REACTIVE NON REACTIVE    Comment: Performed at [facility], [place] [address], [place], [place] [address]  HIV Antibody (routine testing w rflx)     Status: None   Collection Time: 05/28/22  3:18 AM  Result Value Ref Range   HIV Screen 4th Generation wRfx Non Reactive Non Reactive    Comment: Performed at [facility], [place] [address], [place], [place] [address]  Glucose, capillary     Status: Abnormal   Collection Time: 05/28/22  8:41 AM  Result Value Ref  Range   Glucose-Capillary 116 (H) 70 - 99 mg/dL    Comment: Glucose reference range applies only to samples taken after fasting for at least 8 hours.  Rapid urine drug screen (hospital performed)     Status: Abnormal   Collection Time: 05/28/22 10:52 AM  Result Value Ref Range   Opiates NONE DETECTED NONE DETECTED   Cocaine NONE DETECTED NONE DETECTED   Benzodiazepines NONE DETECTED NONE DETECTED   Amphetamines POSITIVE (A) NONE DETECTED   Tetrahydrocannabinol NONE DETECTED NONE DETECTED   Barbiturates NONE DETECTED NONE DETECTED    Comment: (NOTE) DRUG SCREEN FOR MEDICAL PURPOSES ONLY.  IF CONFIRMATION IS NEEDED FOR ANY PURPOSE, NOTIFY LAB WITHIN 5 DAYS.  LOWEST DETECTABLE LIMITS FOR URINE DRUG SCREEN Drug Class                     Cutoff (ng/mL) Amphetamine and metabolites    1000 Barbiturate and metabolites    200 Benzodiazepine                 852 Tricyclics and metabolites     300 Opiates and metabolites        300 Cocaine and metabolites        300 THC                            50 Performed at [facility], [place] [address] [place], [place] [address]   Glucose, capillary     Status: Abnormal   Collection Time: 05/28/22 11:47 AM  Result Value Ref Range   Glucose-Capillary 139 (H) 70 - 99 mg/dL    Comment: Glucose reference range applies only to samples taken after fasting for at least 8 hours.  Ammonia     Status: None   Collection Time: 05/28/22 11:53 AM  Result Value Ref Range   Ammonia 27 9 - 35 umol/L    Comment: Performed at [facility], [place] [address], [place], [place] [address]  Hemoglobin A1c     Status: Abnormal   Collection Time: 05/28/22 11:53 AM  Result Value Ref Range   Hgb A1c MFr Bld 6.1 (H) 4.8 - 5.6 %    Comment: (NOTE) Pre diabetes:          5.7%-6.4%  Diabetes:              >6.4%  Glycemic control for   <7.0% adults with diabetes    Mean Plasma Glucose 128.37 mg/dL    Comment: Performed at [facility] [address] [place], [place] [address]    Current Facility-Administered Medications  Medication Dose Route Frequency Provider Last Rate Last Admin   acetaminophen (TYLENOL) tablet 650 mg  650 mg Oral Q6H PRN Toy Baker, MD   650 mg at 05/28/22 5400   Or   acetaminophen (TYLENOL) suppository 650 mg  650 mg Rectal Q6H PRN Toy Baker, MD       aspirin chewable tablet 81 mg  81 mg Oral BID Toy Baker, MD   81 mg at 05/28/22 0915   cyanocobalamin (VITAMIN B12) injection 1,000 mcg  1,000 mcg Subcutaneous Once Thurnell Lose, MD       [START ON 05/29/2022] cyanocobalamin (VITAMIN B12) tablet 1,000 mcg  1,000 mcg Oral Daily Thurnell Lose, MD       ezetimibe (ZETIA) tablet 10 mg  10 mg Oral Daily Thurnell Lose, MD  10 mg at 05/28/22 1156   fenofibrate tablet 160 mg  160 mg Oral Daily Toy Baker, MD   160 mg at 05/28/22 0915   gabapentin (NEURONTIN) capsule 300 mg  300 mg Oral TID Toy Baker, MD   300 mg at 05/28/22 0915   galantamine (RAZADYNE ER) 24 hr capsule 8 mg  8 mg Oral Daily Doutova,  Nyoka Lint, MD   8 mg at 05/28/22 0915   HYDROcodone-acetaminophen (NORCO/VICODIN) 5-325 MG per tablet 1-2 tablet  1-2 tablet Oral Q4H PRN Toy Baker, MD       hydrOXYzine (ATARAX) tablet 25 mg  25 mg Oral BID BM & HS PRN Doutova, Anastassia, MD       insulin aspart (novoLOG) injection 0-9 Units  0-9 Units Subcutaneous TID WC Toy Baker, MD   1 Units at 05/28/22 1207   levothyroxine (SYNTHROID) tablet 75 mcg  75 mcg Oral Q0600 Toy Baker, MD   75 mcg at 05/28/22 5573   magnesium sulfate IVPB 4 g 100 mL  4 g Intravenous Once Thurnell Lose, MD 50 mL/hr at 05/28/22 1202 4 g at 05/28/22 1202   melatonin tablet 3 mg  3 mg Oral QHS Doutova, Anastassia, MD       methocarbamol (ROBAXIN) tablet 500 mg  500 mg Oral Q8H PRN Doutova, Anastassia, MD       metoprolol succinate (TOPROL-XL) 24 hr tablet 100 mg  100 mg Oral Daily Doutova, Anastassia, MD   100 mg at 05/28/22 0915   niacin (VITAMIN B3) tablet 100 mg  100 mg Oral BID WC Thurnell Lose, MD   100 mg at 05/28/22 1156   nortriptyline (PAMELOR) capsule 100 mg  100 mg Oral QHS Doutova, Anastassia, MD   100 mg at 05/27/22 2228   polyethylene glycol (MIRALAX / GLYCOLAX) packet 17 g  17 g Oral Daily PRN Toy Baker, MD       senna (SENOKOT) tablet 8.6 mg  1 tablet Oral BID Doutova, Anastassia, MD   8.6 mg at 05/27/22 2230   traMADol (ULTRAM) tablet 50-100 mg  50-100 mg Oral Q8H PRN Toy Baker, MD   50 mg at 05/27/22 1203    Musculoskeletal: Strength & Muscle Tone: within normal limits Gait & Station: normal Patient leans: N/A            Psychiatric Specialty Exam:  Presentation  General Appearance: Appropriate for Environment; Casual  Eye Contact:Good  Speech:Clear and Coherent; Normal Rate  Speech Volume:Normal  Handedness:Right   Mood and Affect  Mood:Euthymic  Affect:Appropriate; Congruent   Thought Process  Thought Processes:Coherent; Linear  Descriptions of  Associations:Intact  Orientation:Full (Time, Place and Person)  Thought Content:Logical  History of Schizophrenia/Schizoaffective disorder:No data recorded Duration of Psychotic Symptoms:No data recorded Hallucinations:Hallucinations: None  Ideas of Reference:None  Suicidal Thoughts:Suicidal Thoughts: No  Homicidal Thoughts:Homicidal Thoughts: No   Sensorium  Memory:Immediate Good; Recent Good; Remote Good  Judgment:Fair  Insight:Fair   Executive Functions  Concentration:Good  Attention Span:Good  [place] of Knowledge:Good  Language:Good   Psychomotor Activity  Psychomotor Activity:Psychomotor Activity: Normal   Assets  Assets:Communication Skills; Desire for Improvement; Physical Health; Leisure Time; Social Support   Sleep  Sleep:Sleep: Fair   Physical Exam: Physical Exam Vitals and nursing note reviewed.  Constitutional:      Appearance: Normal appearance. She is normal weight.  Skin:    General: Skin is warm.     Capillary Refill: Capillary refill takes less than 2 seconds.  Neurological:  General: No focal deficit present.     Mental Status: She is alert and oriented to person, place, and time. Mental status is at baseline.  Psychiatric:        Mood and Affect: Mood normal.        Behavior: Behavior normal.        Thought Content: Thought content normal.        Judgment: Judgment normal.    Review of Systems  HENT:  Positive for tinnitus. Hearing loss: visual distortions.  Eyes:  Positive for double vision.  Musculoskeletal:  Positive for falls.  Neurological:  Positive for dizziness, focal weakness and headaches.  Psychiatric/Behavioral: Negative.  Negative for depression.   All other systems reviewed and are negative.  Blood pressure (!) 123/56, pulse 70, temperature 98.4 F (36.9 C), temperature source Oral, resp. rate (!) 22, height 5' 1.25" (1.556 m), weight 65.8 kg, SpO2 96 %. Body mass index is 27.33  kg/m.  81 year old Caucasian female who presents with increasing falls to the ED from her assisted living facility, status post total shoulder replacement x 3 weeks ago.  Patient has past medical history significant for memory impairment, major depression in complete remission, tinnitus, vertigo, coronary artery disease, type 2 diabetes, hypothyroidism, and neuropathy with the exception of musical hallucinosis, she declines any additional psychiatric symptoms.  From a psychiatric standpoint she feels her depression is stable, on amitriptyline.  Patient has been admitted to a medical unit, neurology consult is being completed, patient unable to have MRI due to cardiac pacemaker.  Chart review does show patient has been working with an Youth worker, since 2019 when she received a diagnosis of bilateral sensorineural hearing loss and subjective tinnitus.  She also is followed by ophthalmology in 2020 for floaters, worsening glaucoma.  Suspect patient has multifactorial reasons for reported symptoms, however both patient and daughter are in agreement that psychotropic medication is not warranted at this time.  We did discuss possible neuropsych evaluation for worsening cognitive impairment.  Treatment Plan Summary: Plan   Continue current psychotropic medications. Initiate delirium precautions -Patient with decreased level of vitamin B12, currently being treated during this hospitalization stay.  May be contributing to worsening hallucinations and cognitive impairment. -Continue working closely with neurology, to identify any additional cause for musical hallucinosis, visual distortions, falls.  Did discuss with patient and daughter, likelihood of age-related changes however unable to obtain MRI to provide additional diagnosis. -Encourage neurocognitive rehabilitation if appropriate. -Continue with early physical therapy and mobilization.  Psychiatry will sign off at this time.  Disposition: No  evidence of imminent risk to self or others at present.   Patient does not meet criteria for psychiatric inpatient admission.  Suella Broad, FNP 05/28/2022 1:41 PM

## 2022-05-28 NOTE — Progress Notes (Addendum)
Neurology Progress Note   S:// Patient is laying in bed awake and alert in NAD. Daughter is at the bedside and provides the history. Patient states she has had tinnitus for years and experiences hearing music, however today the music has changed to a fast [place] music. She also states she has had visual problems where her vision looks like gigsaw puzzle pieces that are moving. She also endorses having major depression and has had visual hallucinations in the past with seeing ants all over, but has not had any visual hallucinations recently. She states that her nortriptyline was decreased recently but has been resumed to the '100mg'$  dose due to worsening depression.  She also endorses having wording finding issues and slurred speech and imbalance issues which has gotten worse over the last month or so and at times her left leg seems to drag and her left arm sometimes wont do what she wants it to do. Daughter states that the slurred speech and word finding issues seem to get worse if she is tired. She denies numbness, tingling, headache or new visual problems.  EEG today with no seizures identified  O:// Current vital signs: BP (!) 123/56 (BP Location: Left Arm)   Pulse 70   Temp 98.4 F (36.9 C) (Oral)   Resp (!) 22   Ht 5' 1.25" (1.556 m)   Wt 65.8 kg   SpO2 96%   BMI 27.17 kg/m  Vital signs in last 24 hours: Temp:  [97.9 F (36.6 C)-98.5 F (36.9 C)] 98.4 F (36.9 C) (08/22 1150) Pulse Rate:  [68-84] 70 (08/22 1150) Resp:  [15-22] 22 (08/22 1150) BP: (123-139)/(55-85) 123/56 (08/22 1150) SpO2:  [95 %-100 %] 96 % (08/22 1150)  GENERAL: Awake, alert in  sitting up in bed in NAD HEENT: - Normocephalic and atraumatic, dry mm LUNGS - Clear to auscultation bilaterally with no wheezes CV - S1S2 RRR, no m/r/g, equal pulses bilaterally. ABDOMEN - Soft, nontender, nondistended with normoactive BS Ext: warm, well perfused, intact peripheral pulses, no edema  NEURO:  Mental Status:  AA&Ox3 Language: speech is clear, with very mild dysarthria.  Naming, repetition, fluency, and comprehension intact. Cranial Nerves: PERRL, mm/brisk. EOMI, has mild nystagmus on leftward gaze and states she has double vision on lateral gaze bilaterally, visual fields full, no facial asymmetry, facial sensation intact, hearing intact, tongue/uvula/soft palate midline, normal sternocleidomastoid and trapezius muscle strength. No evidence of tongue atrophy or fibrillations Motor: Left arm 5/5, Right arm 4/5, no drift ( has injury to right shoulder), grips equal, bilateral lowers 5/5 Tone: is normal and bulk is normal Sensation- Intact to light touch bilaterally Coordination: FTN intact bilaterally, no ataxia in BLE. Gait- deferred    Medications  Current Facility-Administered Medications:    acetaminophen (TYLENOL) tablet 650 mg, 650 mg, Oral, Q6H PRN, 650 mg at 05/28/22 0929 **OR** acetaminophen (TYLENOL) suppository 650 mg, 650 mg, Rectal, Q6H PRN, Doutova, Anastassia, MD   aspirin chewable tablet 81 mg, 81 mg, Oral, BID, Doutova, Anastassia, MD, 81 mg at 05/28/22 0915   cyanocobalamin (VITAMIN B12) injection 1,000 mcg, 1,000 mcg, Subcutaneous, Once, Thurnell Lose, MD   [START ON 05/29/2022] cyanocobalamin (VITAMIN B12) tablet 1,000 mcg, 1,000 mcg, Oral, Daily, Candiss Norse, Margaree Mackintosh, MD   ezetimibe (ZETIA) tablet 10 mg, 10 mg, Oral, Daily, Lala Lund K, MD, 10 mg at 05/28/22 1156   fenofibrate tablet 160 mg, 160 mg, Oral, Daily, Doutova, Anastassia, MD, 160 mg at 05/28/22 0915   gabapentin (NEURONTIN) capsule 300 mg, 300  mg, Oral, TID, Doutova, Anastassia, MD, 300 mg at 05/28/22 0915   galantamine (RAZADYNE ER) 24 hr capsule 8 mg, 8 mg, Oral, Daily, Doutova, Anastassia, MD, 8 mg at 05/28/22 0915   HYDROcodone-acetaminophen (NORCO/VICODIN) 5-325 MG per tablet 1-2 tablet, 1-2 tablet, Oral, Q4H PRN, Doutova, Anastassia, MD   hydrOXYzine (ATARAX) tablet 25 mg, 25 mg, Oral, BID BM & HS PRN,  Doutova, Anastassia, MD   insulin aspart (novoLOG) injection 0-9 Units, 0-9 Units, Subcutaneous, TID WC, Doutova, Anastassia, MD, 1 Units at 05/28/22 1207   levothyroxine (SYNTHROID) tablet 75 mcg, 75 mcg, Oral, Q0600, Doutova, Anastassia, MD, 75 mcg at 05/28/22 5638   magnesium sulfate IVPB 4 g 100 mL, 4 g, Intravenous, Once, Thurnell Lose, MD, Last Rate: 50 mL/hr at 05/28/22 1202, 4 g at 05/28/22 1202   melatonin tablet 3 mg, 3 mg, Oral, QHS, Doutova, Anastassia, MD   methocarbamol (ROBAXIN) tablet 500 mg, 500 mg, Oral, Q8H PRN, Doutova, Anastassia, MD   metoprolol succinate (TOPROL-XL) 24 hr tablet 100 mg, 100 mg, Oral, Daily, Doutova, Anastassia, MD, 100 mg at 05/28/22 0915   niacin (VITAMIN B3) tablet 100 mg, 100 mg, Oral, BID WC, Lala Lund K, MD, 100 mg at 05/28/22 1156   nortriptyline (PAMELOR) capsule 100 mg, 100 mg, Oral, QHS, Doutova, Anastassia, MD, 100 mg at 05/27/22 2228   polyethylene glycol (MIRALAX / GLYCOLAX) packet 17 g, 17 g, Oral, Daily PRN, Doutova, Anastassia, MD   senna (SENOKOT) tablet 8.6 mg, 1 tablet, Oral, BID, Doutova, Anastassia, MD, 8.6 mg at 05/27/22 2230   traMADol (ULTRAM) tablet 50-100 mg, 50-100 mg, Oral, Q8H PRN, Doutova, Anastassia, MD, 50 mg at 05/27/22 1203 Labs CBC    Component Value Date/Time   WBC 8.0 05/27/2022 1925   RBC 4.16 05/27/2022 1925   HGB 11.8 (L) 05/27/2022 1925   HCT 36.4 05/27/2022 1925   PLT 267 05/27/2022 1925   MCV 87.5 05/27/2022 1925   MCH 28.4 05/27/2022 1925   MCHC 32.4 05/27/2022 1925   RDW 13.2 05/27/2022 1925   LYMPHSABS 3.0 05/27/2022 1925   MONOABS 0.5 05/27/2022 1925   EOSABS 0.8 (H) 05/27/2022 1925   BASOSABS 0.0 05/27/2022 1925    CMP     Component Value Date/Time   NA 137 05/27/2022 1925   K 4.9 05/27/2022 1925   CL 107 05/27/2022 1925   CO2 24 05/27/2022 1925   GLUCOSE 123 (H) 05/27/2022 1925   BUN 18 05/27/2022 1925   CREATININE 1.02 (H) 05/27/2022 1925   CREATININE 0.93 06/18/2016 1528    CALCIUM 9.9 05/27/2022 1925   PROT 6.3 (L) 05/27/2022 1925   ALBUMIN 3.6 05/27/2022 1925   AST 18 05/27/2022 1925   ALT 18 05/27/2022 1925   ALKPHOS 93 05/27/2022 1925   BILITOT 0.5 05/27/2022 1925   GFRNONAA 55 (L) 05/27/2022 1925   GFRAA >60 10/16/2018 1916    glycosylated hemoglobin  Lipid Panel     Component Value Date/Time   CHOL 206 (H) 05/27/2022 2238   TRIG 171 (H) 05/27/2022 2238   HDL 60 05/27/2022 2238   CHOLHDL 3.4 05/27/2022 2238   VLDL 34 05/27/2022 2238   LDLCALC 112 (H) 05/27/2022 2238     Imaging I have reviewed images in epic and the results pertinent to this consultation are:  CTA head/neck -scan of the brain: 1. No intracranial large vessel occlusion. Moderate narrowing in the proximal right P2, mild-to-moderate stenosis in the bilateral cavernous and supraclinoid ICAs, and mild multifocal narrowing in  the left-greater-than-right PCA. 2. 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 3.  No acute intracranial process.  LABS: TSH 5.134 Vitamin b12 276 MMA pending  RPR non reactive  HIV negative  UDS positive for amphetamines  Vitamin  B1 pending  Homocysteine pending  Assessment:  81 y.o. female with complex medical history with complaints of gait instability which is likely multifactorial along with prior history of migraines and cognitive deficits.  History of multiple falls.  Also complaining of hallucinations that have been off and on for over a year.  Complains of diplopia and weakness, being worked up for possible myasthenia gravis outpatient.  Prior history of migraines which have been infrequent but certain episodes that she describes could be complex migraine as well.  Differentials below: -Evaluate for underlying electrographic brain abnormalities in the setting of possible underlying neurodegenerative process -Possible complex migraine -Suspect underlying  cognitive deficits-needs outpatient work-up for dementia -Rule out stroke-repeat CT head in 24 hours from last CT. -Rule out underlying psychiatric etiology for hallucinations  Do not think current presentation is a stroke or TIA.  Recommendations: - Repeat CT head scan in 24 hours to r/o stroke. (PPM is not MRI compatible) - Obtain LTM EEG. -Agree with psychiatry evaluation - Neurology will continue to follow   Will eventually need outpatient neuropsych testing after discharge Plan discussed with Dr. Candiss Norse.  Attending Neurohospitalist Addendum Patient seen and examined with APP/Resident. Agree with the history and physical as documented above. Agree with the plan as documented, which I helped formulate. I have independently reviewed the chart, obtained history, review of systems and examined the patient.I have personally reviewed pertinent head/neck/spine imaging (CT/MRI). Please feel free to call with any questions.  -- Amie Portland, MD Neurologist [facility] Pager: [phone]

## 2022-05-28 NOTE — Evaluation (Signed)
Clinical/Bedside Swallow Evaluation Patient Details  Name: Heidi [name] MRN: [id number] Date of Birth: 1941-07-11  Today's Date: 05/28/2022 Time: SLP Start Time (ACUTE ONLY): 33 SLP Stop Time (ACUTE ONLY): 1440 SLP Time Calculation (min) (ACUTE ONLY): 10 min  Past Medical History:  Past Medical History:  Diagnosis Date   Anxiety    Atrial fibrillation ([place])    Intermittent   CAD (coronary artery disease)    non obstructive   CKD (chronic kidney disease)    Diabetes mellitus without complication (HCC)    Dysrhythmia    PAF   GERD (gastroesophageal reflux disease)    Glaucoma    Narrow Angle   Heart murmur    HTN (hypertension)    Hyperlipidemia    Hypothyroid    Major depression    10-17-2010: hospitalized for suicidal, [name] (D/C 10-30-2010)   Migraine headache    OSA (obstructive sleep apnea)    CPAP nightly   Peripheral neuropathy    Presence of permanent cardiac pacemaker    Seasonal allergies    Sleep apnea    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    ABDOMINAL HYSTERECTOMY  1976   APPENDECTOMY     BREAST EXCISIONAL BIOPSY     BREAST SURGERY     fibroids removed, benign   CARDIAC CATHETERIZATION  02-12-2006   Minor irregularities: RCA-mid 40%, LM-normal, LAD-normal. EF 65%.   CHOLECYSTECTOMY     ECTOPIC PREGNANCY SURGERY     treatment x3, last one 11-02-2009 at [facility]   goiter     goiter removed     MASS EXCISION Right 10/18/2015   Procedure: EXCISION OF 6CM MASS ON RIGHT  NECK/BACK;  Surgeon: [name], MD;  Location: [facility];  Service: General;  Laterality: Right;   PACEMAKER GENERATOR CHANGE N/A 01/15/2012   Procedure: PACEMAKER GENERATOR CHANGE;  Surgeon: Deboraha Sprang, MD;  Location: [facility] CATH  LAB;  Service: Cardiovascular;  Laterality: N/A;   PACEMAKER INSERTION     Permanent. [employer] EnRhyth 04/2006, set as DDDR   REVERSE SHOULDER ARTHROPLASTY Right 05/01/2022   Procedure: REVERSE SHOULDER ARTHROPLASTY;  Surgeon: Hiram Gash, MD;  Location: [facility] ORS;  Service: Orthopedics;  Laterality: Right;   TUMOR REMOVAL  2000   Fibroid from breast x4-most recent   WRIST FRACTURE SURGERY  11-2007   right, had metal plate inserted   HPI:  Patient is an 81 y.o. female with PMH: HTN, HLD, CAD, CKD, OSA, mild dementia, GERD, glaucoma, a-fib. She presented to hospital from her ALF on 05/08/22 due to multiple falls and diplopia. CT head reported no evidence of acute infarct, hemorrhage, mass, mass effect,  or midline shift. She had a 05/01/22 R revers shoulder arthoplasty .    Assessment / Plan / Recommendation  Clinical Impression  Patient not currently presenting with clinical s/s of dysphagia as per this bedside/clinical swallow evaluation. She has a h/o GERD and per patient (confirmed by daughter), she does take a PPI. Patient reported that medications help reflux but she still has difficulties though she was not able to specify. She was feeding herself from her lunch tray and SLP did not observe any overt s/s of aspiration or penetration. No further swallow function assessments or interventions needed at this time. Thank  you for this consult! SLP Visit Diagnosis: Dysphagia, unspecified (R13.10)    Aspiration Risk  No limitations    Diet Recommendation Regular;Thin liquid   Liquid Administration via: Straw;Cup Medication Administration: Whole meds with liquid Supervision: Patient able to self feed Compensations: Slow rate;Small sips/bites Postural Changes: Seated upright at 90 degrees;Remain upright for at least 30 minutes after po intake    Other  Recommendations Oral Care Recommendations: Oral care BID    Recommendations for follow up therapy are one component of a multi-disciplinary  discharge planning process, led by the attending physician.  Recommendations may be updated based on patient status, additional functional criteria and insurance authorization.  Follow up Recommendations No SLP follow up      Assistance Recommended at Discharge None  Functional Status Assessment Patient has not had a recent decline in their functional status  Frequency and Duration   N/A         Prognosis   N/A     Swallow Study   General Date of Onset: 05/26/22 HPI: Patient is an 81 y.o. female with PMH: HTN, HLD, CAD, CKD, OSA, mild dementia, GERD, glaucoma, a-fib. She presented to hospital from her ALF on 05/08/22 due to multiple falls and diplopia. CT head reported no evidence of acute infarct, hemorrhage, mass, mass effect,  or midline shift. She had a 05/01/22 R revers shoulder arthoplasty . Type of Study: Bedside Swallow Evaluation Previous Swallow Assessment: none found Diet Prior to this Study: Regular;Thin liquids Temperature Spikes Noted: No Respiratory Status: Room air History of Recent Intubation: No Behavior/Cognition: Alert;Cooperative;Pleasant mood Oral Cavity Assessment: Within Functional Limits Oral Care Completed by SLP: No Oral Cavity - Dentition: Adequate natural dentition Vision: Functional for self-feeding Self-Feeding Abilities: Able to feed self Patient Positioning: Upright in bed Baseline Vocal Quality: Normal Volitional Cough: Strong Volitional Swallow: Able to elicit    Oral/Motor/Sensory Function Overall Oral Motor/Sensory Function: Within functional limits   Ice Chips     Thin Liquid Thin Liquid: Within functional limits Presentation: Straw;Self Fed    Nectar Thick     Honey Thick     Puree     Solid     Solid: Within functional limits Presentation: [place], MA, CCC-SLP Speech Therapy

## 2022-05-28 NOTE — Progress Notes (Signed)
Initial Nutrition Assessment  DOCUMENTATION CODES:   Not applicable  INTERVENTION:  Encourage adequate PO intake Ensure Enlive po once daily, each supplement provides 350 kcal and 20 grams of protein. MVI with minerals daily  NUTRITION DIAGNOSIS:   Increased nutrient needs related to acute illness as evidenced by estimated needs.  GOAL:   Patient will meet greater than or equal to 90% of their needs  MONITOR:   PO intake, Supplement acceptance, Labs, Weight trends  REASON FOR ASSESSMENT:   Consult Assessment of nutrition requirement/status  ASSESSMENT:   Pt admitted d/t dizziness and a fall. PMH significant for HTN, HLD, depression, GERD, glaucoma, afib, CAD, CKD, OSA, tachy-bradycardia s/p PPM, mild dementia.  S/p SLP evaluation. No s/s of dysphagia. Continue with regular diet/thin liquids.   TIA workup negative. R/o other causes for dizziness and recurrent falls.   Pt has been living at an ALF. She states that she has been fairly independent however falling a lot more recently. She typically eats 2-3 times per day. She recalls that breakfast is served early so she typically does not make it to breakfast. She has a mini kitchen in her apartment so she generally eats something in her room for breakfast, then has lunch and dinner in the dining room. She denies significant changes to her PO intake and endorses having a good appetite. She denies having difficulty chewing or swallowing foods.   She denies having significant recent weight loss. She reports that recently she has gained 5 lbs since moving to her facility. Reviewed weight history. It appears within the last year her weight has remained stable between 63.5-65.8 kg. No weight loss noted. Will continue to monitor throughout admission.   Medications: Vitamin B12, fenofibrate, SSI 0-9 units TID, synthroid, melatonin, niacin, senna, IV Mg  Labs: Cr 1.02, Mg 1.6, GFR 55, HgbA1c 6.5%, CBG's 116/106 x24 hours  NUTRITION -  FOCUSED PHYSICAL EXAM: Also of note patient reports having recently had a R should repair 3 weeks ago.   Flowsheet Row Most Recent Value  Orbital Region No depletion  Upper Arm Region No depletion  Thoracic and Lumbar Region No depletion  Buccal Region No depletion  Temple Region Mild depletion  Clavicle Bone Region Mild depletion  Clavicle and Acromion Bone Region No depletion  Scapular Bone Region No depletion  Dorsal Hand Mild depletion  Patellar Region Mild depletion  Anterior Thigh Region Mild depletion  Posterior Calf Region No depletion  Edema (RD Assessment) None  Hair Reviewed  Eyes Reviewed  Mouth Reviewed  Skin Reviewed  Nails Reviewed      Diet Order:   Diet Order             Diet Carb Modified Fluid consistency: Thin; Room service appropriate? Yes  Diet effective now                   EDUCATION NEEDS:   Education needs have been addressed  Skin:  Skin Assessment: Reviewed RN Assessment  Last BM:  PTA  Height:   Ht Readings from Last 1 Encounters:  05/26/22 5' 1.25" (1.556 m)    Weight:   Wt Readings from Last 1 Encounters:  05/26/22 65.8 kg   BMI:  Body mass index is 27.17 kg/m.  Estimated Nutritional Needs:   Kcal:  1500-1700  Protein:  80-95g  Fluid:  >/=1.5L  Clayborne Dana, RDN, LDN Clinical Nutrition

## 2022-05-28 NOTE — Assessment & Plan Note (Signed)
replete and recheck in am

## 2022-05-28 NOTE — Evaluation (Signed)
Physical Therapy Evaluation Patient Details Name: Heidi [name] MRN: [id number] DOB: December 05, 1940 Today's Date: 05/28/2022  History of Present Illness  Pt is a 81 yr old female who presented 05/26/22 due to multiple falls and diplopia. Pt pending MRI. Pt did have on  05/01/22 R revers shoulder arthoplasty  and per chart of to come out of sling for RW use with PWB. PMH: HTN, HLD, depression, GERD, glaucoma, atrial fibrillation not on anticoagulation, pacemaker CAD, CKD, OSA , mild dementia  Clinical Impression   Pt presents with generalized weakness, impaired balance with multiple falls and significant fear of falling, impaired gait, and decreased activity tolerance vs baseline. Pt to benefit from acute PT to address deficits. Pt ambulated room distance with use of rollator, pt complaining of intermittent "jigsaw puzzle" vision where her vision jumbles during gait. Pt expresses fear of falling to the point of tearfulness during session, states she is "terrified" to fall again because she thinks it will be "the end". 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 Pt will need ST-SNF to return to PLOF and decrease fall risk. PT to progress mobility as tolerated, and will continue to follow acutely.         Recommendations for follow up therapy are one component of a multi-disciplinary discharge planning process, led by the attending physician.  Recommendations may be updated based on patient status, additional functional criteria and insurance authorization.  Follow Up Recommendations Skilled nursing-short term rehab (<3 hours/day) Can patient physically be transported by private vehicle: Yes    Assistance Recommended at Discharge Intermittent Supervision/Assistance  Patient can return home with the following  A little help with walking and/or transfers;A little help with bathing/dressing/bathroom    Equipment Recommendations None recommended  by PT  Recommendations for Other Services       Functional Status Assessment Patient has had a recent decline in their functional status and demonstrates the ability to make significant improvements in function in a reasonable and predictable amount of time.     Precautions / Restrictions Precautions Precautions: Fall Precaution Comments: multiple falls Restrictions Weight Bearing Restrictions: No Other Position/Activity Restrictions: RUE WB through RW for steadying only      Mobility  Bed Mobility Overal bed mobility: Needs Assistance Bed Mobility: Supine to Sit     Supine to sit: Min assist     General bed mobility comments: assist for trunk rise, increased time and use of bedrails on L    Transfers Overall transfer level: Needs assistance Equipment used: Rollator (4 wheels) Transfers: Sit to/from Stand Sit to Stand: Min assist           General transfer comment: assist to rise and steady, increased assist needed when standing with HHA only.    Ambulation/Gait Ambulation/Gait assistance: Min assist Gait Distance (Feet): 30 Feet Assistive device: Rollator (4 wheels) Gait Pattern/deviations: Step-through pattern, Decreased stride length, Trunk flexed Gait velocity: decr     General Gait Details: steadying assist needed especially with directional changes, cues for focusing eyes on one thing in front of her. Pt reporting visual "jigsaw puzzle" and jumbling at times, not associated with head turns today  Stairs            Wheelchair Mobility    Modified Rankin (Stroke Patients Only)       Balance Overall balance assessment: Needs assistance Sitting-balance support: Feet supported Sitting balance-Leahy Scale: Fair     Standing balance support: Bilateral upper extremity  supported, Reliant on assistive device for balance, During functional activity Standing balance-Leahy Scale: Poor                               Pertinent Vitals/Pain  Pain Assessment Pain Assessment: No/denies pain    Home Living Family/patient expects to be discharged to:: Assisted living                 Home Equipment: Shower seat;Grab bars - tub/shower;Grab bars - toilet;Rollator (4 wheels);Wheelchair - manual;Hand held shower head      Prior Function Prior Level of Function : Needs assist       Physical Assist : Mobility (physical) Mobility (physical): Gait   Mobility Comments: Pt reported that they were using a Rw but due to falls they were being transported vis WC level       Hand Dominance   Dominant Hand: Right    Extremity/Trunk Assessment   Upper Extremity Assessment Upper Extremity Assessment: Defer to OT evaluation RUE Deficits / Details: pt had a R reverse shoulder arthroplaty on 05/01/22 and was working on table top activities    Lower Extremity Assessment Lower Extremity Assessment: Generalized weakness    Cervical / Trunk Assessment Cervical / Trunk Assessment: Kyphotic  Communication   Communication: Other (comment) (Noted at sometimes they had difficulties with expressive language)  Cognition Arousal/Alertness: Awake/alert Behavior During Therapy: Anxious Overall Cognitive Status: Within Functional Limits for tasks assessed                                 General Comments: significant anxiety about falling, to the point of tearfulness during session        General Comments      Exercises Other Exercises Other Exercises: HIT: overshooting with R HIT; over and undershooting with saccades testing   Assessment/Plan    PT Assessment Patient needs continued PT services  PT Problem List Decreased strength;Decreased mobility;Decreased activity tolerance;Decreased balance;Decreased knowledge of use of DME;Pain;Decreased cognition       PT Treatment Interventions Therapeutic activities;Gait training;Therapeutic exercise;Patient/family education;DME instruction;Balance training;Neuromuscular  re-education;Functional mobility training    PT Goals (Current goals can be found in the Care Plan section)  Acute Rehab PT Goals PT Goal Formulation: With patient Time For Goal Achievement: 06/11/22 Potential to Achieve Goals: Good    Frequency Min 3X/week     Co-evaluation               AM-PAC PT "6 Clicks" Mobility  Outcome Measure Help needed turning from your back to your side while in a flat bed without using bedrails?: A Little Help needed moving from lying on your back to sitting on the side of a flat bed without using bedrails?: A Little Help needed moving to and from a bed to a chair (including a wheelchair)?: A Little Help needed standing up from a chair using your arms (e.g., wheelchair or bedside chair)?: A Little Help needed to walk in hospital room?: A Little Help needed climbing 3-5 steps with a railing? : A Lot 6 Click Score: 17    End of Session Equipment Utilized During Treatment: Gait belt Activity Tolerance: Patient tolerated treatment well Patient left: in chair;with call bell/phone within reach;with chair alarm set Nurse Communication: Mobility status PT Visit Diagnosis: Other abnormalities of gait and mobility (R26.89);Muscle weakness (generalized) (M62.81)    Time: 6734-1937 PT Time Calculation (  min) (ACUTE ONLY): 27 min   Charges:   PT Evaluation $PT Eval Low Complexity: 1 Low PT Treatments $Therapeutic Activity: 8-22 mins        Stacie Glaze, PT DPT Acute Rehabilitation Services Pager [phone]  Office [phone]   Roxine Caddy E Ruffin Pyo 05/28/2022, 3:21 PM

## 2022-05-28 NOTE — Procedures (Signed)
Patient Name: Heidi [name]  MRN: [id number]  Epilepsy Attending: Lora Havens  Referring Physician/Provider: Lorenza Chick, MD  Date: 05/28/2022 Duration: 24.35 mins  Patient history: 81 year old female with dizziness.  EEG to evaluate for seizure.  Level of alertness: Awake  AEDs during EEG study: None  Technical aspects: This EEG study was done with scalp electrodes positioned according to the 10-20 International system of electrode placement. Electrical activity was reviewed with band pass filter of 1-'70Hz'$ , sensitivity of 7 uV/mm, display speed of 61m/sec with a '60Hz'$  notched filter applied as appropriate. EEG data were recorded continuously and digitally stored.  Video monitoring was available and reviewed as appropriate.  Description: The posterior dominant rhythm consists of 8 Hz activity of moderate voltage (25-35 uV) seen predominantly in posterior head regions, symmetric and reactive to eye opening and eye closing. Hyperventilation and photic stimulation were not performed.     Of note, there was electrode artifact at F8.  IMPRESSION: This study is within normal limits. No seizures or epileptiform discharges were seen throughout the recording.  A normal interictal EEG does not exclude nor support the diagnosis of epilepsy.   [name]

## 2022-05-29 ENCOUNTER — Observation Stay (HOSPITAL_COMMUNITY): Payer: Medicare Other

## 2022-05-29 DIAGNOSIS — G43109 Migraine with aura, not intractable, without status migrainosus: Secondary | ICD-10-CM | POA: Diagnosis present

## 2022-05-29 DIAGNOSIS — R410 Disorientation, unspecified: Secondary | ICD-10-CM | POA: Diagnosis not present

## 2022-05-29 DIAGNOSIS — H532 Diplopia: Secondary | ICD-10-CM | POA: Diagnosis present

## 2022-05-29 DIAGNOSIS — F03918 Unspecified dementia, unspecified severity, with other behavioral disturbance: Secondary | ICD-10-CM

## 2022-05-29 DIAGNOSIS — I5042 Chronic combined systolic (congestive) and diastolic (congestive) heart failure: Secondary | ICD-10-CM | POA: Diagnosis present

## 2022-05-29 DIAGNOSIS — N1831 Chronic kidney disease, stage 3a: Secondary | ICD-10-CM | POA: Diagnosis present

## 2022-05-29 DIAGNOSIS — R946 Abnormal results of thyroid function studies: Secondary | ICD-10-CM | POA: Diagnosis present

## 2022-05-29 DIAGNOSIS — F325 Major depressive disorder, single episode, in full remission: Secondary | ICD-10-CM | POA: Diagnosis present

## 2022-05-29 DIAGNOSIS — E1122 Type 2 diabetes mellitus with diabetic chronic kidney disease: Secondary | ICD-10-CM | POA: Diagnosis present

## 2022-05-29 DIAGNOSIS — Z96611 Presence of right artificial shoulder joint: Secondary | ICD-10-CM | POA: Diagnosis not present

## 2022-05-29 DIAGNOSIS — I6523 Occlusion and stenosis of bilateral carotid arteries: Secondary | ICD-10-CM | POA: Diagnosis present

## 2022-05-29 DIAGNOSIS — E538 Deficiency of other specified B group vitamins: Secondary | ICD-10-CM | POA: Diagnosis present

## 2022-05-29 DIAGNOSIS — I5032 Chronic diastolic (congestive) heart failure: Secondary | ICD-10-CM | POA: Diagnosis not present

## 2022-05-29 DIAGNOSIS — E785 Hyperlipidemia, unspecified: Secondary | ICD-10-CM | POA: Diagnosis present

## 2022-05-29 DIAGNOSIS — E1142 Type 2 diabetes mellitus with diabetic polyneuropathy: Secondary | ICD-10-CM | POA: Diagnosis present

## 2022-05-29 DIAGNOSIS — R4189 Other symptoms and signs involving cognitive functions and awareness: Secondary | ICD-10-CM | POA: Diagnosis present

## 2022-05-29 DIAGNOSIS — F03A2 Unspecified dementia, mild, with psychotic disturbance: Secondary | ICD-10-CM | POA: Diagnosis present

## 2022-05-29 DIAGNOSIS — E0781 Sick-euthyroid syndrome: Secondary | ICD-10-CM | POA: Diagnosis present

## 2022-05-29 DIAGNOSIS — R569 Unspecified convulsions: Secondary | ICD-10-CM | POA: Diagnosis not present

## 2022-05-29 DIAGNOSIS — I495 Sick sinus syndrome: Secondary | ICD-10-CM | POA: Diagnosis present

## 2022-05-29 DIAGNOSIS — Z66 Do not resuscitate: Secondary | ICD-10-CM | POA: Diagnosis present

## 2022-05-29 DIAGNOSIS — I480 Paroxysmal atrial fibrillation: Secondary | ICD-10-CM | POA: Diagnosis present

## 2022-05-29 DIAGNOSIS — R270 Ataxia, unspecified: Secondary | ICD-10-CM | POA: Diagnosis not present

## 2022-05-29 DIAGNOSIS — R42 Dizziness and giddiness: Secondary | ICD-10-CM | POA: Diagnosis not present

## 2022-05-29 DIAGNOSIS — R2990 Unspecified symptoms and signs involving the nervous system: Secondary | ICD-10-CM | POA: Diagnosis not present

## 2022-05-29 DIAGNOSIS — I130 Hypertensive heart and chronic kidney disease with heart failure and stage 1 through stage 4 chronic kidney disease, or unspecified chronic kidney disease: Secondary | ICD-10-CM | POA: Diagnosis present

## 2022-05-29 DIAGNOSIS — N179 Acute kidney failure, unspecified: Secondary | ICD-10-CM | POA: Diagnosis present

## 2022-05-29 DIAGNOSIS — Z471 Aftercare following joint replacement surgery: Secondary | ICD-10-CM | POA: Diagnosis not present

## 2022-05-29 DIAGNOSIS — F03A18 Unspecified dementia, mild, with other behavioral disturbance: Secondary | ICD-10-CM | POA: Diagnosis present

## 2022-05-29 DIAGNOSIS — E039 Hypothyroidism, unspecified: Secondary | ICD-10-CM | POA: Diagnosis present

## 2022-05-29 DIAGNOSIS — Z9181 History of falling: Secondary | ICD-10-CM | POA: Diagnosis not present

## 2022-05-29 DIAGNOSIS — Z950 Presence of cardiac pacemaker: Secondary | ICD-10-CM | POA: Diagnosis not present

## 2022-05-29 LAB — GLUCOSE, CAPILLARY
Glucose-Capillary: 165 mg/dL — ABNORMAL HIGH (ref 70–99)
Glucose-Capillary: 119 mg/dL — ABNORMAL HIGH (ref 70–99)
Glucose-Capillary: 163 mg/dL — ABNORMAL HIGH (ref 70–99)
Glucose-Capillary: 149 mg/dL — ABNORMAL HIGH (ref 70–99)
Glucose-Capillary: 162 mg/dL — ABNORMAL HIGH (ref 70–99)

## 2022-05-29 LAB — T3: T3, Total: 100 ng/dL (ref 71–180)

## 2022-05-29 LAB — HOMOCYSTEINE: Homocysteine: 21.9 umol/L — ABNORMAL HIGH (ref 0.0–21.3)

## 2022-05-29 MED ORDER — ENOXAPARIN SODIUM 40 MG/0.4ML IJ SOSY
40.0000 mg | PREFILLED_SYRINGE | INTRAMUSCULAR | Status: DC
  Administered 2022-05-29: 40 mg via SUBCUTANEOUS
  Filled 2022-05-29: qty 0.4

## 2022-05-29 NOTE — Progress Notes (Signed)
   ORTHOPAEDIC PROGRESS NOTE  s/p Right reverse total shoulder arthroplasty and open treatment of shoulder dislocation on 05/01/22 with Dr. Griffin Basil  SUBJECTIVE: Patient was admitted to the hospital on 05/26/22 for multiple falls and diplopia, possible TIA. Heidi [name] is undergoing workup for this. Her daughter let us know Heidi [name] was admitted to the hospital. Heidi [name] still notes mild to moderate pain in the right shoulder. Heidi [name] states this has been improving.  OBJECTIVE: PE: General: resting in hospital bed, NAD Right upper extremity: incision is healing well. Steri-strips were removed. Heidi [name] endorses axillary nerve sensation which Heidi [name] states is symmetric. Was able to appreciate deltoid motor function. Passive forward elevation to 90 degrees. External rotation to 30 degrees. Strength not tested in early post-operative setting. Distal motor and sensory function intact.   Vitals:   05/28/22 2000 05/29/22 0003  BP:    Pulse: 81   Resp:    Temp: 97.9 F (36.6 C) 98 F (36.7 C)  SpO2: 97%    Stable post-op images.   ASSESSMENT: Heidi [name] is a 81 y.o. female  - admitted to the hospital for diplopia and multiple falls, there is a question of possible TIA. Heidi [name] is undergoing work-up for this - 4 weeks s/p right reverse total shoulder arthroplasty   PLAN:  Weightbearing:  - partial weightbearing RUE.  - <5 pound weight restriction - okay to weight bear through right upper extremity with her walker - okay for passive/active ROM of the right shoulder - NO INTERNAL ROTATION OR BACKWARDS EXTENSION of the right upper extremity   Insicional and dressing care: n/a - okay to leave incision open to air - well healed incision 4 weeks after surgery   Orthopedic device(s): None - Heidi [name] can discontinue her sling at this point   Showering: per primary   VTE prophylaxis: per primary team, no orthopedic contraindications  Pain control: Tylenol, NSAIDs. Narcotics not indicated at this point after  surgery  Follow - up plan: 6-8 weeks in office.  Contact information:  Weekdays 8-5 Dr. Ophelia Charter, Noemi Chapel PA-C, After hours and holidays please check [email or web address] for group call information for [facility]  Noemi Chapel, PA-C 05/29/2022

## 2022-05-29 NOTE — TOC Initial Note (Signed)
Transition of Care [facility]) - Initial/Assessment Note    Patient Details  Name: Heidi [name] MRN: [id number] Date of Birth: May 13, 1941  Transition of Care [facility] CM/SW Contact:    Coralee Pesa, [place] Phone Number: 05/29/2022, 12:33 PM  Clinical Narrative:                 CSW met with pt and family at bedside to discuss discharge planning. CSW advised that the recommendation is SNF, all questions answered and family agreeable. Medicare.gov information given and family and pt agreeable to a fax out. CSW will provide offers when available. Family notes pt is from [facility], and the plan is to return after SNF. TOC will continue to follow for DC needs.  Expected Discharge Plan: Skilled Nursing Facility Barriers to Discharge: SNF Pending bed offer, Insurance Authorization, Continued Medical Work up   Patient Goals and CMS Choice Patient states their goals for this hospitalization and ongoing recovery are:: Pt would like to get stronger and be able to return to ALF. CMS Medicare.gov Compare Post Acute Care list provided to:: Patient Choice offered to / list presented to : Patient, Adult Children  Expected Discharge Plan and Services Expected Discharge Plan: [place] Acute Care Choice: [place] Living arrangements for the past 2 months: [place]                                      Prior Living Arrangements/Services Living arrangements for the past 2 months: [place] Lives with:: Facility Resident Patient language and need for interpreter reviewed:: Yes Do you feel safe going back to the place where you live?: Yes      Need for Family Participation in Patient Care: Yes (Comment) Care giver support system in place?: Yes (comment)   Criminal Activity/Legal Involvement Pertinent to Current Situation/Hospitalization: No - Comment as needed  Activities of Daily Living Home Assistive Devices/Equipment:  Eyeglasses ADL Screening (condition at time of admission) Patient's cognitive ability adequate to safely complete daily activities?: Yes Is the patient deaf or have difficulty hearing?: No Does the patient have difficulty seeing, even when wearing glasses/contacts?: No Does the patient have difficulty concentrating, remembering, or making decisions?: No Patient able to express need for assistance with ADLs?: Yes Does the patient have difficulty dressing or bathing?: No Independently performs ADLs?: Yes (appropriate for developmental age) Does the patient have difficulty walking or climbing stairs?: Yes Weakness of Legs: Both Weakness of Arms/Hands: None  Permission Sought/Granted Permission sought to share information with : Family Supports Permission granted to share information with : Yes, Verbal Permission Granted  Share Information with NAME: Loran Senters     Permission granted to share info w Relationship: Daughter  Permission granted to share info w Contact Information: [phone]  Emotional Assessment Appearance:: Appears stated age Attitude/Demeanor/Rapport: Engaged Affect (typically observed): Appropriate Orientation: : Oriented to Self, Oriented to Place, Oriented to  Time, Oriented to Situation Alcohol / Substance Use: Not Applicable Psych Involvement: Yes (comment)  Admission diagnosis:  Diplopia [H53.2] TIA (transient ischemic attack) [G45.9] Gait instability [R26.81] Fall, initial encounter [W19.XXXA] Patient Active Problem List   Diagnosis Date Noted   Hypomagnesemia 05/28/2022   Ataxia 05/28/2022   Falls, initial encounter 05/27/2022   TIA (transient ischemic attack) 05/26/2022   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   HLD (  hyperlipidemia) 05/30/2017   GERD (gastroesophageal reflux disease) 05/30/2017   UTI (urinary tract infection) 40/68/4033   Acute metabolic encephalopathy 53/31/7409   Type II diabetes mellitus with renal  manifestations ([place]) 05/30/2017   Hypothyroidism 05/30/2017   Depression 05/30/2017   CAD (coronary artery disease) 05/30/2017   CKD (chronic kidney disease), stage III (HCC) 05/30/2017   Chronic diastolic CHF (congestive heart failure) ([place]) 05/30/2017   Dementia with behavioral disturbance ([place]) 05/30/2017   Essential hypertension, benign 06/15/2014   Allergic reaction 04/28/2012   Tachycardia-bradycardia ([place]) 12/24/2011   Pacemaker-mdt 12/24/2011   HTN (hypertension) 12/24/2011   A-fib ([place]) 11/07/2011   PCP:  Wenda Low, MD Pharmacy:  No Pharmacies Listed    Social Determinants of Health (SDOH) Interventions    Readmission Risk Interventions     No data to display

## 2022-05-29 NOTE — Progress Notes (Addendum)
Neurology Progress Note   S:// Patient is laying in bed in NAD. She is connected to LTM. She states she has not heard any music playing since yesterday.   She is a little confused this am, she is not able to state the place or city that we are in, she states we are at a "resort" EEG with no seizures identified  RN states she was sundowning yesterday evening  O:// Current vital signs: BP 117/66 (BP Location: Left Arm)   Pulse 81   Temp 98 F (36.7 C) (Oral)   Resp (!) 25   Ht 5' 1.25" (1.556 m)   Wt 65.8 kg   SpO2 97%   BMI 27.17 kg/m  Vital signs in last 24 hours: Temp:  [97.9 F (36.6 C)-98.4 F (36.9 C)] 98 F (36.7 C) (08/23 0003) Pulse Rate:  [70-84] 81 (08/22 2000) Resp:  [20-25] 25 (08/22 1537) BP: (117-137)/(56-85) 117/66 (08/22 1537) SpO2:  [96 %-97 %] 97 % (08/22 2000)  GENERAL: Awake, alert in  laying in bed in NAD HEENT: - Normocephalic and atraumatic, dry mm LUNGS - Clear to auscultation bilaterally with no wheezes CV - S1S2 RRR, no m/r/g, equal pulses bilaterally. ABDOMEN - Soft, nontender, nondistended with normoactive BS Ext: warm, well perfused, intact peripheral pulses, no edema  NEURO:  Mental Status: AA&Ox2. Unable to name place or city, states "resort" when asked place  Language: speech is clear, with very mild dysarthria.  Naming, repetition, fluency, and comprehension intact. Cranial Nerves: PERRL, mm/brisk. EOMI, has mild nystagmus on leftward gaze and states she has double vision on lateral gaze bilaterally, visual fields full, no facial asymmetry, facial sensation intact, hearing intact, tongue/uvula/soft palate midline, normal sternocleidomastoid and trapezius muscle strength. No evidence of tongue atrophy or fibrillations Motor: Left arm 5/5, Right arm 4/5, no drift ( has injury to right shoulder), grips equal, bilateral lowers 5/5 Tone: is normal and bulk is normal Sensation- Intact to light touch bilaterally Coordination: FTN intact  bilaterally, no ataxia in BLE. Gait- deferred    Medications  Current Facility-Administered Medications:    acetaminophen (TYLENOL) tablet 650 mg, 650 mg, Oral, Q6H PRN, 650 mg at 05/28/22 0929 **OR** acetaminophen (TYLENOL) suppository 650 mg, 650 mg, Rectal, Q6H PRN, Doutova, Anastassia, MD   aspirin chewable tablet 81 mg, 81 mg, Oral, BID, Doutova, Anastassia, MD, 81 mg at 05/28/22 2056   cyanocobalamin (VITAMIN B12) injection 1,000 mcg, 1,000 mcg, Subcutaneous, Once, Thurnell Lose, MD   cyanocobalamin (VITAMIN B12) tablet 1,000 mcg, 1,000 mcg, Oral, Daily, Candiss Norse, Margaree Mackintosh, MD   ezetimibe (ZETIA) tablet 10 mg, 10 mg, Oral, Daily, Lala Lund K, MD, 10 mg at 05/28/22 1156   feeding supplement (ENSURE ENLIVE / ENSURE PLUS) liquid 237 mL, 237 mL, Oral, Q24H, Singh, Prashant K, MD, 237 mL at 05/28/22 1734   fenofibrate tablet 160 mg, 160 mg, Oral, Daily, Doutova, Anastassia, MD, 160 mg at 05/28/22 0915   gabapentin (NEURONTIN) capsule 300 mg, 300 mg, Oral, TID, Doutova, Anastassia, MD, 300 mg at 05/28/22 2056   galantamine (RAZADYNE ER) 24 hr capsule 8 mg, 8 mg, Oral, Daily, Doutova, Anastassia, MD, 8 mg at 05/28/22 0915   HYDROcodone-acetaminophen (NORCO/VICODIN) 5-325 MG per tablet 1-2 tablet, 1-2 tablet, Oral, Q4H PRN, Doutova, Anastassia, MD   hydrOXYzine (ATARAX) tablet 25 mg, 25 mg, Oral, BID BM & HS PRN, Doutova, Anastassia, MD   insulin aspart (novoLOG) injection 0-9 Units, 0-9 Units, Subcutaneous, TID WC, Doutova, Anastassia, MD, 1 Units at 05/28/22 1600  levothyroxine (SYNTHROID) tablet 75 mcg, 75 mcg, Oral, Q0600, Doutova, Anastassia, MD, 75 mcg at 05/28/22 5625   melatonin tablet 3 mg, 3 mg, Oral, QHS, Doutova, Anastassia, MD, 3 mg at 05/28/22 2056   methocarbamol (ROBAXIN) tablet 500 mg, 500 mg, Oral, Q8H PRN, Doutova, Anastassia, MD   metoprolol succinate (TOPROL-XL) 24 hr tablet 100 mg, 100 mg, Oral, Daily, Doutova, Anastassia, MD, 100 mg at 05/28/22 0915    multivitamin with minerals tablet 1 tablet, 1 tablet, Oral, Daily, Thurnell Lose, MD, 1 tablet at 05/28/22 1734   niacin (VITAMIN B3) tablet 100 mg, 100 mg, Oral, BID WC, Lala Lund K, MD, 100 mg at 05/28/22 1559   nortriptyline (PAMELOR) capsule 100 mg, 100 mg, Oral, QHS, Doutova, Anastassia, MD, 100 mg at 05/28/22 2056   polyethylene glycol (MIRALAX / GLYCOLAX) packet 17 g, 17 g, Oral, Daily PRN, Doutova, Anastassia, MD   senna (SENOKOT) tablet 8.6 mg, 1 tablet, Oral, BID, Doutova, Anastassia, MD, 8.6 mg at 05/28/22 2056   traMADol (ULTRAM) tablet 50-100 mg, 50-100 mg, Oral, Q8H PRN, Doutova, Anastassia, MD, 50 mg at 05/27/22 1203 Labs CBC    Component Value Date/Time   WBC 8.0 05/27/2022 1925   RBC 4.16 05/27/2022 1925   HGB 11.8 (L) 05/27/2022 1925   HCT 36.4 05/27/2022 1925   PLT 267 05/27/2022 1925   MCV 87.5 05/27/2022 1925   MCH 28.4 05/27/2022 1925   MCHC 32.4 05/27/2022 1925   RDW 13.2 05/27/2022 1925   LYMPHSABS 3.0 05/27/2022 1925   MONOABS 0.5 05/27/2022 1925   EOSABS 0.8 (H) 05/27/2022 1925   BASOSABS 0.0 05/27/2022 1925    CMP     Component Value Date/Time   NA 137 05/27/2022 1925   K 4.9 05/27/2022 1925   CL 107 05/27/2022 1925   CO2 24 05/27/2022 1925   GLUCOSE 123 (H) 05/27/2022 1925   BUN 18 05/27/2022 1925   CREATININE 1.02 (H) 05/27/2022 1925   CREATININE 0.93 06/18/2016 1528   CALCIUM 9.9 05/27/2022 1925   PROT 6.3 (L) 05/27/2022 1925   ALBUMIN 3.6 05/27/2022 1925   AST 18 05/27/2022 1925   ALT 18 05/27/2022 1925   ALKPHOS 93 05/27/2022 1925   BILITOT 0.5 05/27/2022 1925   GFRNONAA 55 (L) 05/27/2022 1925   GFRAA >60 10/16/2018 1916    glycosylated hemoglobin  Lipid Panel     Component Value Date/Time   CHOL 206 (H) 05/27/2022 2238   TRIG 171 (H) 05/27/2022 2238   HDL 60 05/27/2022 2238   CHOLHDL 3.4 05/27/2022 2238   VLDL 34 05/27/2022 2238   LDLCALC 112 (H) 05/27/2022 2238     Imaging I have reviewed images in epic and the  results pertinent to this consultation are:  CTA head/neck -scan of the brain: 1. No intracranial large vessel occlusion. Moderate narrowing in the proximal right P2, mild-to-moderate stenosis in the bilateral cavernous and supraclinoid ICAs, and mild multifocal narrowing in the left-greater-than-right PCA. 2. 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 3.  No acute intracranial process.  REPEAT CTH TODAY -NAICP  LABS: TSH 5.134 Vitamin b12 276 MMA pending  RPR non reactive  HIV negative  UDS positive for amphetamines  Vitamin  B1 pending  Homocysteine pending  EEG- 8/22-8/23 This study is within normal limits. No seizures or epileptiform discharges were seen throughout the recording.   Assessment:  81 y.o. female with complex medical  history with complaints of gait instability which is likely multifactorial along with prior history of migraines and cognitive deficits.  History of multiple falls.  Also complaining of hallucinations that have been off and on for over a year.  Complains of diplopia and weakness, being worked up for possible myasthenia gravis outpatient.  Prior history of migraines which have been infrequent but certain episodes that she describes could be complex migraine as well.  Differentials that were considered were as: -Evaluate for underlying electrographic brain abnormalities in the setting of possible underlying neurodegenerative process -Possible complex migraine -Suspect underlying cognitive deficits-needs outpatient work-up for dementia -Rule out stroke-repeat CT head in 24 hours from last Ct with no evidence of acute process. -Rule out underlying psychiatric etiology for hallucinations  Do not think current presentation is a stroke or TIA.  Impression: Likely neuropsychiatric symptoms in the setting of underlying cognitive deficits-needs outpatient  follow-up  Recommendations: - Delirium precautions  - Repeat CT head scan in 24 hours negative for acute process - Discontinue EEG - Will eventually need outpatient neuropsych testing after discharge Plan discussed with Dr. Waldron Labs.  Beulah Gandy DNP, ACNPC-AG   Attending Neurohospitalist Addendum Patient seen and examined with APP/Resident. Agree with the history and physical as documented above. Agree with the plan as documented, which I helped formulate. I have independently reviewed the chart, obtained history, review of systems and examined the patient.I have personally reviewed pertinent head/neck/spine imaging (CT/MRI). Please feel free to call with any questions.  -- Amie Portland, MD Neurologist [facility] Pager: [phone]

## 2022-05-29 NOTE — Progress Notes (Signed)
PROGRESS NOTE                                                                                                                                                                                                             Patient Demographics:    Heidi [name], is a 81 y.o. female, DOB - 01/06/1941, JQZ:[id number]  Outpatient Primary MD for the patient is Wenda Low, MD    LOS - 0  Admit date - 05/26/2022    Chief Complaint  Patient presents with   Fall   Dizziness       Brief Narrative (HPI from H&P)    81 y.o. 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  She presents to the hospital with some diplopia, history of falls and also some auditory hallucinations where she hears some music from time to time.   Subjective:    Heidi [name] today with no significant events overnight as discussed with staff, patient herself denies any complaints. .   Assessment  & Plan :    Diplopia, falls.  Question TIA.   - Initial CT head and CTA did not show any acute changes.  Question if she had TIA or these are hallucinations, neuro team on board.  EEG negative, she has a pacemaker which is not compatible with MRI.  Any further repeat CT head or stroke related work-up will be deferred to neurology.  LDL was above goal hence she was placed on Zetia plus niacin along with fenofibrate, she has severe statin allergy, continue aspirin for secondary prevention.  Her diplopia symptoms have currently resolved.  Complete full TIA work-up.  PT-OT and speech to evaluate. -Was seen by PT/OT, recommendation for SNF placement -Was seen by psychiatric team, at this point no indication for antipsychotic. -Neurology  input greatly appreciated, her altered mental status and symptoms unrelated to CVA  or TIA, will need outpatient follow-up for neuropsych testing  HX of depression and hallucinations in the past.  Now having auditory hallucinations of hearing music.   -Catheter input greatly appreciated, no indication for antipsychotic  B12 deficiency -B12 level is 276, started on supplements.   History of paroxysmal atrial fibrillation.  Mali vas 2 score of greater than  3.  Continue beta-blocker.  Not on anticoagulation due to multiple falls in the past.  Dyslipidemia.  Multiple severe statin allergies.  On fenofibrate which will be continued, Zetia and niacin added with meals.  Hypothyroidism.  On Synthroid continue.  Mildly elevated TSH likely due to sick euthyroid.  Hypomagnesemia.  Replaced.  Chronic diastolic EF.  Recent echo 5 months ago with preserved EF.  Repeat echo pending currently compensated.  Continue beta-blocker.  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  Mild dementia.  Supportive care at risk for delirium.  Newly found borderline B12 deficiency.  Replace here and continue replacement upon discharge.  DM type II.  SSI.  Lab Results  Component Value Date   HGBA1C 6.1 (H) 05/28/2022   CBG (last 3)  Recent Labs    05/29/22 0634 05/29/22 0838 05/29/22 1127  GLUCAP 165* 119* 163*        Condition - Extremely Guarded  Family Communication  :  none at bedside  Code Status :  Full  Consults  :  Neuro, Psych  PUD Prophylaxis :    Procedures  :     CT and CTA  Head - Non acute  EEG - Non acute  TTE      Disposition Plan  :    Status is: Observation  DVT Prophylaxis  :    SCDs Start: 05/27/22 1945    Lab Results  Component Value Date   PLT 267 05/27/2022    Diet :  Diet Order             Diet Carb Modified Fluid consistency: Thin; Room service appropriate? Yes  Diet effective now                    Inpatient Medications  Scheduled Meds:  aspirin  81 mg Oral BID    cyanocobalamin  1,000 mcg Subcutaneous Once   vitamin B-12  1,000 mcg Oral Daily   ezetimibe  10 mg Oral Daily   feeding supplement  237 mL Oral Q24H   fenofibrate  160 mg Oral Daily   gabapentin  300 mg Oral TID   galantamine  8 mg Oral Daily   insulin aspart  0-9 Units Subcutaneous TID WC   levothyroxine  75 mcg Oral Q0600   melatonin  3 mg Oral QHS   metoprolol succinate  100 mg Oral Daily   multivitamin with minerals  1 tablet Oral Daily   niacin  100 mg Oral BID WC   nortriptyline  100 mg Oral QHS   senna  1 tablet Oral BID   Continuous Infusions:   PRN Meds:.acetaminophen **OR** acetaminophen, HYDROcodone-acetaminophen, hydrOXYzine, methocarbamol, polyethylene glycol, traMADol  Antibiotics  :    Anti-infectives (From admission, onward)    None         Phillips Climes M.D on 05/29/2022 at 1:48 PM  To page go to [email or web address]   [facility] -  Office  [phone]  See all Orders from today for further details    Objective:   Vitals:   05/28/22 2000 05/29/22 0003 05/29/22 0800 05/29/22 1128  BP:   (!) 118/48 126/60  Pulse: 81  66 82  Resp:   15 19  Temp: 97.9 F (36.6 C) 98 F (36.7 C) 98.2 F (36.8 C) 98.3 F (36.8 C)  TempSrc: Oral Oral Oral Oral  SpO2: 97%  97% 92%  Weight:      Height:  Wt Readings from Last 3 Encounters:  05/26/22 65.8 kg  05/01/22 63.5 kg  04/22/22 63.5 kg     Intake/Output Summary (Last 24 hours) at 05/29/2022 1348 Last data filed at 05/29/2022 0406 Gross per 24 hour  Intake 240 ml  Output 201 ml  Net 39 ml     Physical Exam   Awake Alert, she remains mildly confused this morning  symmetrical Chest wall movement, Good air movement bilaterally, CTAB RRR,No Gallops,Rubs or new Murmurs, No Parasternal Heave +ve B.Sounds, Abd Soft, No tenderness, No rebound - guarding or rigidity. No Cyanosis, Clubbing or edema, No new Rash or bruise         Data Review:    CBC Recent Labs  Lab 05/26/22 1832  05/27/22 1925  WBC 7.7 8.0  HGB 12.2 11.8*  HCT 38.0 36.4  PLT 283 267  MCV 89.4 87.5  MCH 28.7 28.4  MCHC 32.1 32.4  RDW 13.4 13.2  LYMPHSABS  --  3.0  MONOABS  --  0.5  EOSABS  --  0.8*  BASOSABS  --  0.0    Electrolytes Recent Labs  Lab 05/26/22 1832 05/27/22 1925 05/28/22 1153  NA 135 137  --   K 4.9 4.9  --   CL 101 107  --   CO2 25 24  --   GLUCOSE 108* 123*  --   BUN 28* 18  --   CREATININE 1.18* 1.02*  --   CALCIUM 10.0 9.9  --   AST  --  18  --   ALT  --  18  --   ALKPHOS  --  93  --   BILITOT  --  0.5  --   ALBUMIN  --  3.6  --   MG  --  1.6*  --   INR  --  1.0  --   TSH  --  5.134*  --   HGBA1C  --   --  6.1*  AMMONIA  --   --  27    ------------------------------------------------------------------------------------------------------------------ Recent Labs    05/27/22 2238  CHOL 206*  HDL 60  LDLCALC 112*  TRIG 171*  CHOLHDL 3.4    Lab Results  Component Value Date   HGBA1C 6.1 (H) 05/28/2022    Recent Labs    05/27/22 1925  TSH 5.134*   ------------------------------------------------------------------------------------------------------------------ ID Labs Recent Labs  Lab 05/26/22 1832 05/27/22 1925  WBC 7.7 8.0  PLT 283 267  CREATININE 1.18* 1.02*   Cardiac Enzymes No results for input(s): "CKMB", "TROPONINI", "MYOGLOBIN" in the last 168 hours.  Invalid input(s): "CK"   Micro Results No results found for this or any previous visit (from the past 240 hour(s)).  Radiology Reports CT HEAD WO CONTRAST (5MM)  Result Date: 05/29/2022 CLINICAL DATA:  Dizziness after fall. EXAM: CT HEAD WITHOUT CONTRAST TECHNIQUE: Contiguous axial images were obtained from the base of the skull through the vertex without intravenous contrast. RADIATION DOSE REDUCTION: This exam was performed according to the departmental dose-optimization program which includes automated exposure control, adjustment of the mA and/or kV according to patient  size and/or use of iterative reconstruction technique. COMPARISON:  January 07, 2022. FINDINGS: Brain: Mild diffuse cortical atrophy is noted. No mass effect or midline shift is noted. Ventricular size is within normal limits. There is no evidence of mass lesion, hemorrhage or acute infarction. Vascular: No hyperdense vessel or unexpected calcification. Skull: Normal. Negative for fracture or focal lesion. Sinuses/Orbits: No acute finding. Other: None. IMPRESSION:  No acute intracranial abnormality seen. Electronically Signed   By: Marijo Conception M.D.   On: 05/29/2022 12:44   Overnight EEG with video  Result Date: 05/29/2022 Lora Havens, MD     05/29/2022  1:33 PM Patient Name: KENNADY ZIMMERLE MRN: [id number] Epilepsy Attending: Lora Havens Referring Physician/Provider: August Albino, NP Duration: 05/28/2022 1627 to 05/29/2022 1142  Patient history: 81 year old female with dizziness.  EEG to evaluate for seizure.  Level of alertness: Awake, asleep  AEDs during EEG study: GBP  Technical aspects: This EEG study was done with scalp electrodes positioned according to the 10-20 International system of electrode placement. Electrical activity was reviewed with band pass filter of 1-'70Hz'$ , sensitivity of 7 uV/mm, display speed of 34m/sec with a '60Hz'$  notched filter applied as appropriate. EEG data were recorded continuously and digitally stored.  Video monitoring was available and reviewed as appropriate.  Description: The posterior dominant rhythm consists of 8 Hz activity of moderate voltage (25-35 uV) seen predominantly in posterior head regions, symmetric and reactive to eye opening and eye closing. Sleep was characterized by vertex waves, sleep spindles (12 to 14 Hz) maximal frontocentral region. Hyperventilation and photic stimulation were not performed.    IMPRESSION: This study is within normal limits. No seizures or epileptiform discharges were seen throughout the recording.  A normal interictal EEG does not  exclude nor support the diagnosis of epilepsy.   PLora Havens  DG Shoulder Right Port  Result Date: 05/29/2022 CLINICAL DATA:  Recent reverse shoulder arthroplasty RIGHT EXAM: RIGHT SHOULDER - 1 VIEW COMPARISON:  05/02/2022 FINDINGS: Osseous demineralization. Reverse RIGHT shoulder arthroplasty components again identified, appear unchanged in position. No fracture, dislocation, or bone destruction. AC joint alignment normal. RIGHT subclavian sequential pacemaker noted. IMPRESSION: Reverse RIGHT shoulder arthroplasty without acute abnormalities. Electronically Signed   By: MLavonia DanaM.D.   On: 05/29/2022 08:21   ECHOCARDIOGRAM COMPLETE  Result Date: 05/28/2022    ECHOCARDIOGRAM REPORT   Patient Name:   Amaris I Smethurst Date of Exam: 05/28/2022 Medical Rec #:  [id number]   Height:       61.3 in Accession #:    [id number]  Weight:       145.0 lb Date of Birth:  [birth year]   BSA:          1.653 m Patient Age:    [age]years     BP:           123/70 mmHg Patient Gender: F            HR:           74 bpm. Exam Location:  Inpatient Procedure: 2D Echo, Cardiac Doppler and Color Doppler Indications:    TIA  History:        Patient has prior history of Echocardiogram examinations, most                 recent 12/11/2021. CAD, Arrythmias:Atrial Fibrillation; Risk                 Factors:Hypertension, Dyslipidemia and Diabetes.  Sonographer:    EMemory ArgueReferring Phys: 3[place] 1. Left ventricular ejection fraction, by estimation, is 60 to 65%. The left ventricle has normal function. The left ventricle has no regional wall motion abnormalities. There is mild left ventricular hypertrophy. Left ventricular diastolic parameters are consistent with Grade I diastolic dysfunction (impaired relaxation).  2. Right ventricular systolic function is normal.  The right ventricular size is normal. Tricuspid regurgitation signal is inadequate for assessing PA pressure.  3. The mitral valve is normal in  structure. No evidence of mitral valve regurgitation.  4. The aortic valve was not well visualized. Aortic valve regurgitation is not visualized.  5. The inferior vena cava is normal in size with greater than 50% respiratory variability, suggesting right atrial pressure of 3 mmHg. Comparison(s): No significant change from prior study. FINDINGS  Left Ventricle: Left ventricular ejection fraction, by estimation, is 60 to 65%. The left ventricle has normal function. The left ventricle has no regional wall motion abnormalities. The left ventricular internal cavity size was normal in size. There is  mild left ventricular hypertrophy. Left ventricular diastolic parameters are consistent with Grade I diastolic dysfunction (impaired relaxation). Right Ventricle: The right ventricular size is normal. Right ventricular systolic function is normal. Tricuspid regurgitation signal is inadequate for assessing PA pressure. Left Atrium: Left atrial size was normal in size. Right Atrium: Right atrial size was normal in size. Pericardium: There is no evidence of pericardial effusion. Mitral Valve: The mitral valve is normal in structure. No evidence of mitral valve regurgitation. Tricuspid Valve: Tricuspid valve regurgitation is not demonstrated. Aortic Valve: The aortic valve was not well visualized. Aortic valve regurgitation is not visualized. Aortic valve mean gradient measures 3.0 mmHg. Aortic valve peak gradient measures 5.6 mmHg. Aortic valve area, by VTI measures 2.64 cm. Pulmonic Valve: Pulmonic valve regurgitation is not visualized. Aorta: The aortic root and ascending aorta are structurally normal, with no evidence of dilitation. Venous: The inferior vena cava is normal in size with greater than 50% respiratory variability, suggesting right atrial pressure of 3 mmHg. IAS/Shunts: The interatrial septum was not well visualized.  LEFT VENTRICLE PLAX 2D LVIDd:         3.70 cm   Diastology LVIDs:         2.70 cm   LV e'  medial:    4.68 cm/s LV PW:         1.00 cm   LV E/e' medial:  15.5 LV IVS:        1.20 cm   LV e' lateral:   7.94 cm/s LVOT diam:     1.90 cm   LV E/e' lateral: 9.1 LV SV:         56 LV SV Index:   34 LVOT Area:     2.84 cm  RIGHT VENTRICLE RV S prime:     11.90 cm/s TAPSE (M-mode): 2.0 cm LEFT ATRIUM             Index        RIGHT ATRIUM           Index LA diam:        3.10 cm 1.88 cm/m   RA Area:     11.40 cm LA Vol (A2C):   23.0 ml 13.92 ml/m  RA Volume:   24.20 ml  14.64 ml/m LA Vol (A4C):   28.5 ml 17.24 ml/m LA Biplane Vol: 26.4 ml 15.97 ml/m  AORTIC VALVE AV Area (Vmax):    2.45 cm AV Area (Vmean):   2.45 cm AV Area (VTI):     2.64 cm AV Vmax:           118.00 cm/s AV Vmean:          76.000 cm/s AV VTI:            0.213 m AV Peak Grad:  5.6 mmHg AV Mean Grad:      3.0 mmHg LVOT Vmax:         102.00 cm/s LVOT Vmean:        65.600 cm/s LVOT VTI:          0.198 m LVOT/AV VTI ratio: 0.93  AORTA Ao Root diam: 2.90 cm Ao Asc diam:  3.30 cm MITRAL VALVE MV Area (PHT): 3.53 cm    SHUNTS MV Decel Time: 215 msec    Systemic VTI:  0.20 m MV E velocity: 72.40 cm/s  Systemic Diam: 1.90 cm MV A velocity: 87.90 cm/s MV E/A ratio:  0.82 Mary Scientist, physiological signed by Phineas Inches Signature Date/Time: 05/28/2022/3:57:11 PM    Final    EEG adult  Result Date: 05/28/2022 Lora Havens, MD     05/28/2022 10:33 AM Patient Name: STARLYN DROGE MRN: [id number] Epilepsy Attending: Lora Havens Referring Physician/Provider: Lorenza Chick, MD Date: 05/28/2022 Duration: 24.35 mins Patient history: 81 year old female with dizziness.  EEG to evaluate for seizure. Level of alertness: Awake AEDs during EEG study: None Technical aspects: This EEG study was done with scalp electrodes positioned according to the 10-20 International system of electrode placement. Electrical activity was reviewed with band pass filter of 1-'70Hz'$ , sensitivity of 7 uV/mm, display speed of 80m/sec with a '60Hz'$  notched filter applied  as appropriate. EEG data were recorded continuously and digitally stored.  Video monitoring was available and reviewed as appropriate. Description: The posterior dominant rhythm consists of 8 Hz activity of moderate voltage (25-35 uV) seen predominantly in posterior head regions, symmetric and reactive to eye opening and eye closing. Hyperventilation and photic stimulation were not performed.   Of note, there was electrode artifact at F8. IMPRESSION: This study is within normal limits. No seizures or epileptiform discharges were seen throughout the recording. A normal interictal EEG does not exclude nor support the diagnosis of epilepsy. PLora Havens  CT C-SPINE NO CHARGE  Result Date: 05/28/2022 CLINICAL DATA:  Increased frequency of fall EXAM: CT CERVICAL SPINE WITHOUT CONTRAST TECHNIQUE: Multidetector CT imaging of the cervical spine was performed without intravenous contrast. Multiplanar CT image reconstructions were also generated. RADIATION DOSE REDUCTION: This exam was performed according to the departmental dose-optimization program which includes automated exposure control, adjustment of the mA and/or kV according to patient size and/or use of iterative reconstruction technique. COMPARISON:  None Available. FINDINGS: Alignment: Normal. Skull base and vertebrae: No acute fracture. No primary bone lesion or focal pathologic process. Soft tissues and spinal canal: No prevertebral fluid or swelling. No visible canal hematoma. Disc levels:  Left C3-6 facet arthrosis.  No spinal canal stenosis. Upper chest: Negative Other: None. IMPRESSION: No acute fracture or static subluxation of the cervical spine. Electronically Signed   By: KUlyses JarredM.D.   On: 05/28/2022 00:34   CT ANGIO HEAD NECK W WO CM  Result Date: 05/26/2022 CLINICAL DATA:  Diplopia, dizziness EXAM: CT ANGIOGRAPHY HEAD AND NECK TECHNIQUE: Multidetector CT imaging of the head and neck was performed using the standard protocol during  bolus administration of intravenous contrast. Multiplanar CT image reconstructions and MIPs were obtained to evaluate the vascular anatomy. Carotid stenosis measurements (when applicable) are obtained utilizing NASCET criteria, using the distal internal carotid diameter as the denominator. RADIATION DOSE REDUCTION: This exam was performed according to the departmental dose-optimization program which includes automated exposure control, adjustment of the mA and/or kV according to patient size and/or use of iterative reconstruction technique. CONTRAST:  24m OMNIPAQUE IOHEXOL 350 MG/ML SOLN COMPARISON:  12/25/2020 CTA head neck; correlation is also made with 01/07/2022 CT head FINDINGS: CT HEAD FINDINGS Brain: No evidence of acute infarct, hemorrhage, mass, mass effect, or midline shift. No hydrocephalus or extra-axial fluid collection. Periventricular white matter changes, likely the sequela of chronic small vessel ischemic disease. Vascular: No hyperdense vessel. Skull: Normal. Negative for fracture or focal lesion. Sinuses/Orbits: No acute finding. Status post bilateral lens replacements. Other: The mastoid air cells are well aerated. CTA NECK FINDINGS Aortic arch: Standard branching. Imaged portion shows no evidence of aneurysm or dissection. Aortic atherosclerosis. Approximately 40% stenosis of the proximal left subclavian secondary to calcified plaque (series 11, image 274). Right carotid system: Approximately 60% stenosis in the proximal right ICA, which appears slightly worse than in 2022. No evidence of dissection or occlusion. Left carotid system: Less than 50% stenosis in the proximal left ICA, similar to prior. No evidence of dissection or occlusion Vertebral arteries: No evidence of dissection, occlusion, or hemodynamically significant stenosis (greater than 50%). Skeleton: No acute osseous abnormality. Degenerative changes in the cervical spine. Other neck: Multiple subcentimeter hypoattenuating nodules  in the thyroid, for which no follow-up is indicated. (Reference: J Am Coll Radiol. 2015 Feb;12(2): 143-50) Upper chest: Negative. Review of the MIP images confirms the above findings CTA HEAD FINDINGS Anterior circulation: Both internal carotid arteries are patent to the termini, with mild-to-moderate stenosis in the bilateral cavernous and supraclinoid ICAs, which appears to have progressed from the prior exam. A1 segments patent. Normal anterior communicating artery. Anterior cerebral arteries are patent to their distal aspects. No M1 stenosis or occlusion. MCA branches perfused and symmetric. Posterior circulation: The right vertebral artery primarily supplies the right PICA. The left vertebral artery is patent to the vertebrobasilar junction. Posterior inferior cerebellar arteries patent proximally. Basilar patent to its distal aspect. Superior cerebellar arteries patent proximally. Fetal origin of the left PCA and near fetal origin of the right PCA, with a diminutive right P1 and prominent bilateral posterior communicating arteries. PCAs perfused to their distal aspects with moderate narrowing in the proximal right P2 section (series 11, images 109-110), similar to prior, and otherwise diffuse mild multifocal narrowing in the left-greater-than-right PCA. Venous sinuses: As permitted by contrast timing, patent. Anatomic variants: None significant. Review of the MIP images confirms the above findings IMPRESSION: 1. No intracranial large vessel occlusion. Moderate narrowing in the proximal right P2, mild-to-moderate stenosis in the bilateral cavernous and supraclinoid ICAs, and mild multifocal narrowing in the left-greater-than-right PCA. 2. 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 3.  No acute intracranial process. Electronically Signed   By: AMerilyn BabaM.D.   On: 05/26/2022 21:33   DG HIP UNILAT WITH PELVIS 2-3 VIEWS  RIGHT  Result Date: 05/26/2022 CLINICAL DATA:  Hip pain.  Multiple falls. EXAM: DG HIP (WITH OR WITHOUT PELVIS) 2-3V RIGHT COMPARISON:  None Available. FINDINGS: No acute fracture of the pelvis or right hip. The femoral head is well seated in the acetabulum. Slight acetabular spurring. Intact pubic rami and bony pelvis. Pubic symphysis and sacroiliac joints are congruent. No evidence of avascular necrosis or focal bone abnormality. IMPRESSION: No fracture of the pelvis or right hip. Electronically Signed   By: MKeith RakeM.D.   On: 05/26/2022 21:18

## 2022-05-29 NOTE — Procedures (Addendum)
Patient Name: GIAVONNI CIZEK  MRN: [id number]  Epilepsy Attending: Lora Havens  Referring Physician/Provider: August Albino, NP  Duration: 05/28/2022 1627 to 05/29/2022 1142   Patient history: 81 year old female with dizziness.  EEG to evaluate for seizure.   Level of alertness: Awake, asleep   AEDs during EEG study: GBP   Technical aspects: This EEG study was done with scalp electrodes positioned according to the 10-20 International system of electrode placement. Electrical activity was reviewed with band pass filter of 1-'70Hz'$ , sensitivity of 7 uV/mm, display speed of 14m/sec with a '60Hz'$  notched filter applied as appropriate. EEG data were recorded continuously and digitally stored.  Video monitoring was available and reviewed as appropriate.   Description: The posterior dominant rhythm consists of 8 Hz activity of moderate voltage (25-35 uV) seen predominantly in posterior head regions, symmetric and reactive to eye opening and eye closing. Sleep was characterized by vertex waves, sleep spindles (12 to 14 Hz) maximal frontocentral region. Hyperventilation and photic stimulation were not performed.       IMPRESSION: This study is within normal limits. No seizures or epileptiform discharges were seen throughout the recording.   A normal interictal EEG does not exclude nor support the diagnosis of epilepsy.    Pritika Alvarez OBarbra Sarks

## 2022-05-29 NOTE — NC FL2 (Signed)
[place] LEVEL OF CARE SCREENING TOOL     IDENTIFICATION  Patient Name: Heidi [name] Birthdate: 1941/03/07 Sex: female Admission Date (Current Location): 05/26/2022  [facility] and [place] Number:  Herbalist and Address:  [facility], [place] [address] [place], [place] [address]      Provider Number: [id number]  Attending Physician Name and Address:  El[place]y, Silver Huguenin, MD  Relative Name and Phone Number:  Loran Senters, [phone]    Current Level of Care: Hospital Recommended Level of Care: [place] Prior Approval Number:    Date Approved/Denied:   PASRR Number: [id number]  Discharge Plan: SNF    Current Diagnoses: Patient Active Problem List   Diagnosis Date Noted   Hypomagnesemia 05/28/2022   Ataxia 05/28/2022   Falls, initial encounter 05/27/2022   TIA (transient ischemic attack) 05/26/2022   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   HLD (hyperlipidemia) 05/30/2017   GERD (gastroesophageal reflux disease) 05/30/2017   UTI (urinary tract infection) 23/55/7322   Acute metabolic encephalopathy 02/54/2706   Type II diabetes mellitus with renal manifestations ([place]) 05/30/2017   Hypothyroidism 05/30/2017   Depression 05/30/2017   CAD (coronary artery disease) 05/30/2017   CKD (chronic kidney disease), stage III (HCC) 05/30/2017   Chronic diastolic CHF (congestive heart failure) ([place]) 05/30/2017   Dementia with behavioral disturbance ([place]) 05/30/2017   Essential hypertension, benign 06/15/2014   Allergic reaction 04/28/2012   Tachycardia-bradycardia ([place]) 12/24/2011   Pacemaker-mdt 12/24/2011   HTN (hypertension) 12/24/2011   A-fib ([place]) 11/07/2011    Orientation RESPIRATION BLADDER Height & Weight     Self, Time, Situation, Place  Normal Incontinent, External catheter Weight: 145 lb (65.8 kg) Height:  5' 1.25" (155.6 cm)  BEHAVIORAL SYMPTOMS/MOOD NEUROLOGICAL BOWEL  NUTRITION STATUS      Continent Diet (See DC summary)  AMBULATORY STATUS COMMUNICATION OF NEEDS Skin   Limited Assist Verbally Skin abrasions (R shoulder Abrasion)                       Personal Care Assistance Level of Assistance  Bathing, Feeding, Dressing Bathing Assistance: Limited assistance Feeding assistance: Limited assistance Dressing Assistance: Limited assistance     Functional Limitations Info  Sight, Hearing, Speech Sight Info: Impaired Hearing Info: Impaired Speech Info: Adequate    SPECIAL CARE FACTORS FREQUENCY  PT (By licensed PT), OT (By licensed OT)     PT Frequency: 5x week OT Frequency: 5x week            Contractures Contractures Info: Not present    Additional Factors Info  Code Status, Allergies, Insulin Sliding Scale Code Status Info: DNR Allergies Info: Crestor (Rosuvastatin Calcium)   Effexor (Venlafaxine Hydrochloride)   Aricept (Donepezil)   Latex   Nardil (Phenelzine)   Venlafaxine   Atorvastatin   Clopidogrel   Hibiclens (Chlorhexidine Gluconate)   Lipitor (Atorvastatin Calcium)   Lisinopril   Nardil   Penicillins   Plavix (Clopidogrel Bisulfate)   Simvastatin   Sulfa Antibiotics   Sulfonamide Derivatives   Tape   Insulin Sliding Scale Info: Insulin Aspart (Novolog) 0-9 U 3x daily w/ meals       Current Medications (05/29/2022):  This is the current hospital active medication list Current Facility-Administered Medications  Medication Dose Route Frequency Provider Last Rate Last Admin   acetaminophen (TYLENOL) tablet 650 mg  650 mg Oral Q6H PRN Toy Baker, MD   650 mg  at 05/28/22 1610   Or   acetaminophen (TYLENOL) suppository 650 mg  650 mg Rectal Q6H PRN Toy Baker, MD       aspirin chewable tablet 81 mg  81 mg Oral BID Toy Baker, MD   81 mg at 05/29/22 0827   cyanocobalamin (VITAMIN B12) injection 1,000 mcg  1,000 mcg Subcutaneous Once Thurnell Lose, MD       cyanocobalamin (VITAMIN B12) tablet 1,000  mcg  1,000 mcg Oral Daily Thurnell Lose, MD   1,000 mcg at 05/29/22 0827   ezetimibe (ZETIA) tablet 10 mg  10 mg Oral Daily Thurnell Lose, MD   10 mg at 05/29/22 9604   feeding supplement (ENSURE ENLIVE / ENSURE PLUS) liquid 237 mL  237 mL Oral Q24H Thurnell Lose, MD   237 mL at 05/28/22 1734   fenofibrate tablet 160 mg  160 mg Oral Daily Toy Baker, MD   160 mg at 05/29/22 0827   gabapentin (NEURONTIN) capsule 300 mg  300 mg Oral TID Toy Baker, MD   300 mg at 05/29/22 5409   galantamine (RAZADYNE ER) 24 hr capsule 8 mg  8 mg Oral Daily Toy Baker, MD   8 mg at 05/29/22 0827   HYDROcodone-acetaminophen (NORCO/VICODIN) 5-325 MG per tablet 1-2 tablet  1-2 tablet Oral Q4H PRN Toy Baker, MD       hydrOXYzine (ATARAX) tablet 25 mg  25 mg Oral BID BM & HS PRN Doutova, Anastassia, MD       insulin aspart (novoLOG) injection 0-9 Units  0-9 Units Subcutaneous TID WC Toy Baker, MD   2 Units at 05/29/22 1201   levothyroxine (SYNTHROID) tablet 75 mcg  75 mcg Oral Q0600 Toy Baker, MD   75 mcg at 05/28/22 8119   melatonin tablet 3 mg  3 mg Oral QHS Doutova, Anastassia, MD   3 mg at 05/28/22 2056   methocarbamol (ROBAXIN) tablet 500 mg  500 mg Oral Q8H PRN Doutova, Anastassia, MD       metoprolol succinate (TOPROL-XL) 24 hr tablet 100 mg  100 mg Oral Daily Doutova, Anastassia, MD   100 mg at 05/29/22 1478   multivitamin with minerals tablet 1 tablet  1 tablet Oral Daily Thurnell Lose, MD   1 tablet at 05/29/22 2956   niacin (VITAMIN B3) tablet 100 mg  100 mg Oral BID WC Thurnell Lose, MD   100 mg at 05/29/22 0827   nortriptyline (PAMELOR) capsule 100 mg  100 mg Oral QHS Doutova, Anastassia, MD   100 mg at 05/28/22 2056   polyethylene glycol (MIRALAX / GLYCOLAX) packet 17 g  17 g Oral Daily PRN Toy Baker, MD       senna (SENOKOT) tablet 8.6 mg  1 tablet Oral BID Doutova, Anastassia, MD   8.6 mg at 05/29/22 2130   traMADol  (ULTRAM) tablet 50-100 mg  50-100 mg Oral Q8H PRN Toy Baker, MD   50 mg at 05/27/22 1203     Discharge Medications: Please see discharge summary for a list of discharge medications.  Relevant Imaging Results:  Relevant Lab Results:   Additional Information SS# [id number]  [place]e [place], [place]

## 2022-05-29 NOTE — Progress Notes (Signed)
Occupational Therapy Treatment Patient Details Name: Heidi [name] MRN: [id number] DOB: [birth year] Today's Date: 05/29/2022   History of present illness Pt is a 81 yr old female who presented 05/26/22 due to multiple falls and diplopia. Pt pending MRI. Pt did have on  05/01/22 R revers shoulder arthoplasty  and per chart of to come out of sling for RW use with PWB. PMH: HTN, HLD, depression, GERD, glaucoma, atrial fibrillation not on anticoagulation, pacemaker CAD, CKD, OSA , mild dementia.   OT comments  Patient with good progress toward patient focused goals.  States double vision has resolved, and feeling more confident up and walking in the room.  Able to complete upper body HEP with AA, reviewed sling on PRN, PWB to right shoulder, and to limit IR and Backward Shoulder extension.  Continue OT in the acute setting with SNF recommended for post acute rehab prior to returning home.      Recommendations for follow up therapy are one component of a multi-disciplinary discharge planning process, led by the attending physician.  Recommendations may be updated based on patient status, additional functional criteria and insurance authorization.    Follow Up Recommendations  Skilled nursing-short term rehab (<3 hours/day)    Assistance Recommended at Discharge Frequent or constant Supervision/Assistance  Patient can return home with the following  A little help with walking and/or transfers;A little help with bathing/dressing/bathroom;Assistance with cooking/housework;Assistance with feeding;Direct supervision/assist for medications management;Direct supervision/assist for financial management;Assist for transportation   Equipment Recommendations  None recommended by OT    Recommendations for Other Services      Precautions / Restrictions Precautions Precautions: Fall Precaution Comments: multiple falls Restrictions Weight Bearing Restrictions: No RUE Weight Bearing: Partial weight bearing RUE  Partial Weight Bearing Percentage or Pounds: <5 lbs Other Position/Activity Restrictions: RUE WB through RW       Mobility Bed Mobility               General bed mobility comments: up in the recliner    Transfers Overall transfer level: Needs assistance   Transfers: Sit to/from Stand Sit to Stand: Supervision                 Balance Overall balance assessment: Needs assistance Sitting-balance support: Feet supported Sitting balance-Leahy Scale: Good     Standing balance support: Reliant on assistive device for balance Standing balance-Leahy Scale: Poor                             ADL either performed or assessed with clinical judgement   ADL       Grooming: Supervision/safety;Standing                   Toilet Transfer: Min guard;Ambulation;Rolling walker (2 wheels)   Toileting- Clothing Manipulation and Hygiene: Supervision/safety;Sitting/lateral lean;Sit to/from stand              Extremity/Trunk Assessment Upper Extremity Assessment RUE Deficits / Details: pt had a R reverse shoulder arthroplaty on 05/01/22 and was working on table top activities RUE Coordination: decreased gross motor   Lower Extremity Assessment Lower Extremity Assessment: Defer to PT evaluation   Cervical / Trunk Assessment Cervical / Trunk Assessment: Kyphotic    Vision   Vision Assessment?: No apparent visual deficits Additional Comments: patient stating double vision has resolved   Perception Perception Perception: Within Functional Limits   Praxis Praxis Praxis: Intact    Cognition Arousal/Alertness: Awake/alert Behavior During  Therapy: WFL for tasks assessed/performed Overall Cognitive Status: Impaired/Different from baseline                                 General Comments: mild dementia at basline.  Following commands well.        Exercises General Exercises - Upper Extremity Shoulder Flexion: AAROM, Right, Supine, 10  reps Elbow Flexion: AROM, Right, 10 reps Wrist Flexion: AROM, 10 reps Wrist Extension: AROM, 10 reps Digit Composite Flexion: AROM, 10 reps    Shoulder Instructions       General Comments      Pertinent Vitals/ Pain       Pain Assessment Pain Assessment: No/denies pain Pain Intervention(s): Monitored during session                                                          Frequency  Min 2X/week        Progress Toward Goals  OT Goals(current goals can now be found in the care plan section)  Progress towards OT goals: Progressing toward goals  Acute Rehab OT Goals Patient Stated Goal: Get back to my apartment OT Goal Formulation: With patient Time For Goal Achievement: 06/11/22 Potential to Achieve Goals: Good  Plan Discharge plan remains appropriate    Co-evaluation                 AM-PAC OT "6 Clicks" Daily Activity     Outcome Measure   Help from another person eating meals?: None Help from another person taking care of personal grooming?: A Little Help from another person toileting, which includes using toliet, bedpan, or urinal?: A Little Help from another person bathing (including washing, rinsing, drying)?: A Lot Help from another person to put on and taking off regular upper body clothing?: A Little Help from another person to put on and taking off regular lower body clothing?: A Lot 6 Click Score: 17    End of Session Equipment Utilized During Treatment: Gait belt;Rolling walker (2 wheels)  OT Visit Diagnosis: Unsteadiness on feet (R26.81);Other abnormalities of gait and mobility (R26.89);Repeated falls (R29.6);Muscle weakness (generalized) (M62.81);History of falling (Z91.81);Dizziness and giddiness (R42);Pain   Activity Tolerance Patient tolerated treatment well   Patient Left in chair;with call bell/phone within reach;with chair alarm set   Nurse Communication Mobility status        Time: 9758-8325 OT Time  Calculation (min): 18 min  Charges: OT General Charges $OT Visit: 1 Visit OT Treatments $Therapeutic Exercise: 8-22 mins  05/29/2022  RP, OTR/L  Acute Rehabilitation Services  Office:  [phone]   Metta Clines 05/29/2022, 5:02 PM

## 2022-05-29 NOTE — Progress Notes (Signed)
EEG D/C'd. No skin breakdown noted. [facility] was notified.  

## 2022-05-30 DIAGNOSIS — N183 Chronic kidney disease, stage 3 unspecified: Secondary | ICD-10-CM | POA: Diagnosis not present

## 2022-05-30 DIAGNOSIS — I495 Sick sinus syndrome: Secondary | ICD-10-CM | POA: Diagnosis not present

## 2022-05-30 DIAGNOSIS — R2681 Unsteadiness on feet: Secondary | ICD-10-CM | POA: Diagnosis not present

## 2022-05-30 DIAGNOSIS — R270 Ataxia, unspecified: Secondary | ICD-10-CM | POA: Diagnosis not present

## 2022-05-30 DIAGNOSIS — I4891 Unspecified atrial fibrillation: Secondary | ICD-10-CM | POA: Diagnosis not present

## 2022-05-30 DIAGNOSIS — R2990 Unspecified symptoms and signs involving the nervous system: Secondary | ICD-10-CM

## 2022-05-30 DIAGNOSIS — I480 Paroxysmal atrial fibrillation: Secondary | ICD-10-CM | POA: Diagnosis not present

## 2022-05-30 DIAGNOSIS — I130 Hypertensive heart and chronic kidney disease with heart failure and stage 1 through stage 4 chronic kidney disease, or unspecified chronic kidney disease: Secondary | ICD-10-CM | POA: Diagnosis not present

## 2022-05-30 DIAGNOSIS — I10 Essential (primary) hypertension: Secondary | ICD-10-CM | POA: Diagnosis not present

## 2022-05-30 DIAGNOSIS — Z7401 Bed confinement status: Secondary | ICD-10-CM | POA: Diagnosis not present

## 2022-05-30 DIAGNOSIS — E1129 Type 2 diabetes mellitus with other diabetic kidney complication: Secondary | ICD-10-CM | POA: Diagnosis not present

## 2022-05-30 DIAGNOSIS — N189 Chronic kidney disease, unspecified: Secondary | ICD-10-CM | POA: Diagnosis not present

## 2022-05-30 DIAGNOSIS — E119 Type 2 diabetes mellitus without complications: Secondary | ICD-10-CM | POA: Diagnosis not present

## 2022-05-30 DIAGNOSIS — Z743 Need for continuous supervision: Secondary | ICD-10-CM | POA: Diagnosis not present

## 2022-05-30 DIAGNOSIS — I5032 Chronic diastolic (congestive) heart failure: Secondary | ICD-10-CM | POA: Diagnosis not present

## 2022-05-30 DIAGNOSIS — I2510 Atherosclerotic heart disease of native coronary artery without angina pectoris: Secondary | ICD-10-CM | POA: Diagnosis not present

## 2022-05-30 DIAGNOSIS — G459 Transient cerebral ischemic attack, unspecified: Secondary | ICD-10-CM | POA: Diagnosis not present

## 2022-05-30 LAB — BASIC METABOLIC PANEL
Anion gap: 7 (ref 5–15)
BUN: 32 mg/dL — ABNORMAL HIGH (ref 8–23)
CO2: 21 mmol/L — ABNORMAL LOW (ref 22–32)
Calcium: 9.8 mg/dL (ref 8.9–10.3)
Chloride: 109 mmol/L (ref 98–111)
Creatinine, Ser: 1.01 mg/dL — ABNORMAL HIGH (ref 0.44–1.00)
GFR, Estimated: 56 mL/min — ABNORMAL LOW (ref >60)
Glucose, Bld: 120 mg/dL — ABNORMAL HIGH (ref 70–99)
Potassium: 4.1 mmol/L (ref 3.5–5.1)
Sodium: 137 mmol/L (ref 135–145)

## 2022-05-30 LAB — MAGNESIUM: Magnesium: 2 mg/dL (ref 1.7–2.4)

## 2022-05-30 LAB — VITAMIN B1: Vitamin B1 (Thiamine): 102.6 nmol/L (ref 66.5–200.0)

## 2022-05-30 LAB — GLUCOSE, CAPILLARY
Glucose-Capillary: 135 mg/dL — ABNORMAL HIGH (ref 70–99)
Glucose-Capillary: 118 mg/dL — ABNORMAL HIGH (ref 70–99)

## 2022-05-30 LAB — PHOSPHORUS: Phosphorus: 4.1 mg/dL (ref 2.5–4.6)

## 2022-05-30 MED ORDER — TRAMADOL HCL 50 MG PO TABS: 50.0000 mg | ORAL_TABLET | Freq: Three times a day (TID) | ORAL | 0 refills | Status: DC | PRN

## 2022-05-30 MED ORDER — EZETIMIBE 10 MG PO TABS: 10.0000 mg | ORAL_TABLET | Freq: Every day | ORAL | Status: DC

## 2022-05-30 MED ORDER — ENSURE ENLIVE PO LIQD: 237.0000 mL | ORAL | 12 refills | Status: DC

## 2022-05-30 MED ORDER — CYANOCOBALAMIN 1000 MCG PO TABS: 1000.0000 ug | ORAL_TABLET | Freq: Every day | ORAL | Status: AC

## 2022-05-30 MED ORDER — NIACIN 100 MG PO TABS: 100.0000 mg | ORAL_TABLET | Freq: Every day | ORAL | Status: DC

## 2022-05-30 MED ORDER — ACETAMINOPHEN 325 MG PO TABS: 650.0000 mg | ORAL_TABLET | Freq: Four times a day (QID) | ORAL | Status: AC | PRN

## 2022-05-30 MED ORDER — NORTRIPTYLINE HCL 50 MG PO CAPS
100.0000 mg | ORAL_CAPSULE | Freq: Every day | ORAL | Status: AC
Start: 2022-05-30 — End: ?

## 2022-05-30 MED ORDER — MELATONIN 3 MG PO TABS: 3.0000 mg | ORAL_TABLET | Freq: Every day | ORAL | 0 refills | Status: AC

## 2022-05-30 NOTE — Discharge Summary (Signed)
Physician Discharge Summary  Heidi [name]:[id number] DOB: Jul 14, 1941 DOA: 05/26/2022  PCP: Wenda Low, MD  Admit date: 05/26/2022 Discharge date: 05/30/2022  Admitted From: Home Disposition:  SNF   Recommendations for Outpatient Follow-up:  Please recheck vitamin B 12 level in 4 to 6 weeks Please obtain BMP/CBC in one week Patient will need neuropsych testing as outpatient    Discharge Condition:Stable CODE STATUS: DNR Diet recommendation: Heart Healthy / Carb Modified  Brief/Interim Summary:  81 y.o. 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  She presents to the hospital with some diplopia, history of falls and also some auditory hallucinations where she hears some music from time to time.   Stroke like symptoms/TIA Presents with diplopia, falls, new auditory hospital hallucinations - Initial CT head and CTA did not show any acute changes.  Question if she had TIA or these are hallucinations, neuro team on board.  EEG negative, she has a pacemaker which is not compatible with MRI.  Any further repeat CT head or stroke related work-up will be deferred to neurology.  LDL was above goal hence she was placed on Zetia plus niacin along with fenofibrate, she has severe statin allergy, continue aspirin for secondary prevention.  Her diplopia symptoms have currently resolved.   -Was seen by PT/OT, recommendation for SNF placement -Was seen by psychiatric team, at this point no indication for antipsychotic. -Neurology  input greatly appreciated, her altered mental status and symptoms unrelated to CVA or TIA, they are likely neuropsychiatric symptoms in the setting of underlying cognitive deficits, will need outpatient follow-up for neuropsych testing   HX of depression and hallucinations in the past.   -Psychiatry input  greatly appreciated, no indication for antipsychotic   B12 deficiency -B12 level is 276, started on supplements.   History of paroxysmal atrial fibrillation.  Mali vas 2 score of greater than 3.  Continue beta-blocker.  Not on anticoagulation due to multiple falls in the past.   Dyslipidemia.  Multiple severe statin allergies.  On fenofibrate which will be continued, Zetia and niacin added with meals.   Hypothyroidism.  On Synthroid continue.  Mildly elevated TSH likely due to sick euthyroid.   Hypomagnesemia.  Replaced.   Chronic diastolic EF.  Recent echo 5 months ago with preserved EF.  Repeat echo pending currently compensated.  Continue beta-blocker.   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   Mild dementia.  Supportive care at risk for delirium.   Newly found borderline B12 deficiency.  Replace here and continue replacement upon discharge.   DM type II.  SSI during hospital stay, resume metformin on discharge   Discharge Diagnoses:  Principal Problem:   Stroke-like symptoms Active Problems:   A-fib ([facility])   Tachycardia-bradycardia ([place])   HTN (hypertension)   Type II diabetes mellitus with renal manifestations ([place])   Hypothyroidism   Chronic diastolic CHF (congestive heart failure) ([place])   Dementia with behavioral disturbance ([place])   Falls, initial encounter   Hypomagnesemia   Ataxia    Discharge Instructions  Discharge Instructions     Ambulatory referral to Neurology   Complete by: As directed    An appointment is requested in approximately: 4 weeks will need outpatient follow-up for neuropsych testing   Diet - low sodium heart healthy   Complete by: As directed  Discharge instructions   Complete by: As directed    Follow with Primary MD Wenda Low, MD /SNF Physician  Get CBC, CMP, checked  by Primary MD next visit.    Activity: As tolerated with Full fall precautions use walker/cane & assistance as  needed   Disposition SNNF   Diet: Heart Healthy   On your next visit with your primary care physician please Get Medicines reviewed and adjusted.   Please request your Prim.MD to go over all Hospital Tests and Procedure/Radiological results at the follow up, please get all Hospital records sent to your Prim MD by signing hospital release before you go home.   If you experience worsening of your admission symptoms, develop shortness of breath, life threatening emergency, suicidal or homicidal thoughts you must seek medical attention immediately by calling 911 or calling your MD immediately  if symptoms less severe.  You Must read complete instructions/literature along with all the possible adverse reactions/side effects for all the Medicines you take and that have been prescribed to you. Take any new Medicines after you have completely understood and accpet all the possible adverse reactions/side effects.   Do not drive, operating heavy machinery, perform activities at heights, swimming or participation in water activities or provide baby sitting services if your were admitted for syncope or siezures until you have seen by Primary MD or a Neurologist and advised to do so again.  Do not drive when taking Pain medications.    Do not take more than prescribed Pain, Sleep and Anxiety Medications  Special Instructions: If you have smoked or chewed Tobacco  in the last 2 yrs please stop smoking, stop any regular Alcohol  and or any Recreational drug use.  Wear Seat belts while driving.   Please note  You were cared for by a hospitalist during your hospital stay. If you have any questions about your discharge medications or the care you received while you were in the hospital after you are discharged, you can call the unit and asked to speak with the hospitalist on call if the hospitalist that took care of you is not available. Once you are discharged, your primary care physician will handle  any further medical issues. Please note that NO REFILLS for any discharge medications will be authorized once you are discharged, as it is imperative that you return to your primary care physician (or establish a relationship with a primary care physician if you do not have one) for your aftercare needs so that they can reassess your need for medications and monitor your lab values.   Increase activity slowly   Complete by: As directed       Allergies as of 05/30/2022       Reactions   Crestor [rosuvastatin Calcium] Other (See Comments)   Caused Myalgias   Effexor [venlafaxine Hydrochloride] Other (See Comments), Hypertension   Caused BP to go up   Aricept [donepezil]    Other reaction(s): diarrhea   Latex    Other reaction(s): rash   Nardil [phenelzine]    Other reaction(s): HTN   Venlafaxine    BP problems Other reaction(s): Unkown   Atorvastatin Rash   Other reaction(s): rash   Clopidogrel Rash   Hibiclens [chlorhexidine Gluconate] Hives   Lipitor [atorvastatin Calcium] Rash   Lisinopril Rash   Other reaction(s): rash   Nardil Other (See Comments)   Caused BP Problems   Penicillins Swelling, Other (See Comments)   Tolerated Cephalosporin Date: 05/01/22   Plavix [clopidogrel Bisulfate]  Rash   Simvastatin Rash   Other reaction(s): rash   Sulfa Antibiotics Rash   Sulfonamide Derivatives Rash   Tape Rash   Other reaction(s): rash        Medication List     STOP taking these medications    benazepril 10 MG tablet Commonly known as: LOTENSIN   diclofenac 75 MG EC tablet Commonly known as: VOLTAREN   meloxicam 15 MG tablet Commonly known as: MOBIC   oxyCODONE 5 MG immediate release tablet Commonly known as: Oxy IR/ROXICODONE       TAKE these medications    acetaminophen 325 MG tablet Commonly known as: TYLENOL Take 2 tablets (650 mg total) by mouth every 6 (six) hours as needed for mild pain (or Fever >/= 101).   ADULT GUMMY PO Take 1 capsule by mouth  daily.   aspirin 81 MG chewable tablet Commonly known as: Aspirin Childrens Chew 1 tablet (81 mg total) by mouth 2 (two) times daily. For 6 weeks for DVT prophylaxis after surgery   Chloraseptic Sore Throat 6-10 MG lozenge Generic drug: benzocaine-menthol Take 1 lozenge by mouth every 2 (two) hours as needed for sore throat.   cholestyramine 4 g packet Commonly known as: QUESTRAN Take 4 g by mouth daily.   cyanocobalamin 1000 MCG tablet Take 1 tablet (1,000 mcg total) by mouth daily. Start taking on: May 31, 2022   ezetimibe 10 MG tablet Commonly known as: ZETIA Take 1 tablet (10 mg total) by mouth daily. Start taking on: May 31, 2022   feeding supplement Liqd Take 237 mLs by mouth daily.   fenofibrate 160 MG tablet Take 160 mg by mouth daily. Take with food   ferrous sulfate 325 (65 FE) MG tablet Take 325 mg by mouth daily.   fexofenadine-pseudoephedrine 60-120 MG 12 hr tablet Commonly known as: ALLEGRA-D Take 1 tablet by mouth 2 (two) times daily.   fluticasone 50 MCG/ACT nasal spray Commonly known as: FLONASE Place 1 spray into the nose 2 (two) times daily.   furosemide 20 MG tablet Commonly known as: LASIX TAKE 1 TABLET BY MOUTH 3 TIMES A WEEK. What changed:  how much to take how to take this when to take this additional instructions   gabapentin 300 MG capsule Commonly known as: NEURONTIN Take 300 mg by mouth 3 (three) times daily.   galantamine 8 MG 24 hr capsule Commonly known as: RAZADYNE ER Take 8 mg by mouth daily.   hydrOXYzine 25 MG tablet Commonly known as: ATARAX Take 25 mg by mouth at bedtime.   levothyroxine 75 MCG tablet Commonly known as: SYNTHROID Take 75 mcg by mouth daily.   melatonin 3 MG Tabs tablet Take 1 tablet (3 mg total) by mouth at bedtime.   metFORMIN 500 MG tablet Commonly known as: GLUCOPHAGE Take 500 mg by mouth 2 (two) times daily.   methocarbamol 500 MG tablet Commonly known as: ROBAXIN Take 1 tablet  (500 mg total) by mouth every 8 (eight) hours as needed for muscle spasms.   metoprolol succinate 100 MG 24 hr tablet Commonly known as: TOPROL-XL TAKE 1 TABLET(100 MG) BY MOUTH DAILY   niacin 100 MG tablet Commonly known as: VITAMIN B3 Take 1 tablet (100 mg total) by mouth at bedtime.   nortriptyline 50 MG capsule Commonly known as: PAMELOR Take 2 capsules (100 mg total) by mouth at bedtime. What changed:  medication strength how much to take additional instructions Another medication with the same name was removed. Continue taking this  medication, and follow the directions you see here.   pantoprazole 40 MG tablet Commonly known as: PROTONIX Take 40 mg by mouth 2 (two) times daily.   PRESCRIPTION MEDICATION Inhale into the lungs at bedtime. CPAP   sodium chloride 0.65 % Soln nasal spray Commonly known as: OCEAN Place 1 spray into both nostrils every 2 (two) hours as needed for congestion.   spironolactone 25 MG tablet Commonly known as: ALDACTONE Take 1 tablet (25 mg total) by mouth daily. What changed: when to take this   traMADol 50 MG tablet Commonly known as: Ultram Take 1 tablet (50 mg total) by mouth every 8 (eight) hours as needed for severe pain. What changed: how much to take   Vitamin D-3 125 MCG (5000 UT) Tabs Take 5,000 Units by mouth daily.        Allergies  Allergen Reactions   Crestor [Rosuvastatin Calcium] Other (See Comments)    Caused Myalgias   Effexor [Venlafaxine Hydrochloride] Other (See Comments) and Hypertension    Caused BP to go up   Aricept [Donepezil]     Other reaction(s): diarrhea   Latex     Other reaction(s): rash   Nardil [Phenelzine]     Other reaction(s): HTN   Venlafaxine     BP problems Other reaction(s): Unkown   Atorvastatin Rash    Other reaction(s): rash   Clopidogrel Rash   Hibiclens [Chlorhexidine Gluconate] Hives   Lipitor [Atorvastatin Calcium] Rash   Lisinopril Rash    Other reaction(s): rash   Nardil  Other (See Comments)    Caused BP Problems   Penicillins Swelling and Other (See Comments)    Tolerated Cephalosporin Date: 05/01/22     Plavix [Clopidogrel Bisulfate] Rash   Simvastatin Rash    Other reaction(s): rash   Sulfa Antibiotics Rash   Sulfonamide Derivatives Rash   Tape Rash    Other reaction(s): rash    Consultations: Neurology Psychiatry   Procedures/Studies: CT HEAD WO CONTRAST (5MM)  Result Date: 05/29/2022 CLINICAL DATA:  Dizziness after fall. EXAM: CT HEAD WITHOUT CONTRAST TECHNIQUE: Contiguous axial images were obtained from the base of the skull through the vertex without intravenous contrast. RADIATION DOSE REDUCTION: This exam was performed according to the departmental dose-optimization program which includes automated exposure control, adjustment of the mA and/or kV according to patient size and/or use of iterative reconstruction technique. COMPARISON:  January 07, 2022. FINDINGS: Brain: Mild diffuse cortical atrophy is noted. No mass effect or midline shift is noted. Ventricular size is within normal limits. There is no evidence of mass lesion, hemorrhage or acute infarction. Vascular: No hyperdense vessel or unexpected calcification. Skull: Normal. Negative for fracture or focal lesion. Sinuses/Orbits: No acute finding. Other: None. IMPRESSION: No acute intracranial abnormality seen. Electronically Signed   By: Marijo Conception M.D.   On: 05/29/2022 12:44   Overnight EEG with video  Result Date: 05/29/2022 Lora Havens, MD     05/29/2022  1:33 PM Patient Name: Heidi [name] MRN: [id number] Epilepsy Attending: Lora Havens Referring Physician/Provider: August Albino, NP Duration: 05/28/2022 1627 to 05/29/2022 1142  Patient history: 81 year old female with dizziness.  EEG to evaluate for seizure.  Level of alertness: Awake, asleep  AEDs during EEG study: GBP  Technical aspects: This EEG study was done with scalp electrodes positioned according to the 10-20  International system of electrode placement. Electrical activity was reviewed with band pass filter of 1-'70Hz'$ , sensitivity of 7 uV/mm, display speed of 26m/sec with a  $'60Hz'F$  notched filter applied as appropriate. EEG data were recorded continuously and digitally stored.  Video monitoring was available and reviewed as appropriate.  Description: The posterior dominant rhythm consists of 8 Hz activity of moderate voltage (25-35 uV) seen predominantly in posterior head regions, symmetric and reactive to eye opening and eye closing. Sleep was characterized by vertex waves, sleep spindles (12 to 14 Hz) maximal frontocentral region. Hyperventilation and photic stimulation were not performed.    IMPRESSION: This study is within normal limits. No seizures or epileptiform discharges were seen throughout the recording.  A normal interictal EEG does not exclude nor support the diagnosis of epilepsy.   Lora Havens   DG Shoulder Right Port  Result Date: 05/29/2022 CLINICAL DATA:  Recent reverse shoulder arthroplasty RIGHT EXAM: RIGHT SHOULDER - 1 VIEW COMPARISON:  05/02/2022 FINDINGS: Osseous demineralization. Reverse RIGHT shoulder arthroplasty components again identified, appear unchanged in position. No fracture, dislocation, or bone destruction. AC joint alignment normal. RIGHT subclavian sequential pacemaker noted. IMPRESSION: Reverse RIGHT shoulder arthroplasty without acute abnormalities. Electronically Signed   By: Lavonia Dana M.D.   On: 05/29/2022 08:21   ECHOCARDIOGRAM COMPLETE  Result Date: 05/28/2022    ECHOCARDIOGRAM REPORT   Patient Name:   Heidi [name] Date of Exam: 05/28/2022 Medical Rec #:  [id number]    Height:       61.3 in Accession #:    [id number]   Weight:       145.0 lb Date of Birth:  1941-02-08    BSA:          1.653 m Patient Age:    52 years     BP:           123/70 mmHg Patient Gender: F            HR:           74 bpm. Exam Location:  Inpatient Procedure: 2D Echo, Cardiac Doppler and Color  Doppler Indications:    TIA  History:        Patient has prior history of Echocardiogram examinations, most                 recent 12/11/2021. CAD, Arrythmias:Atrial Fibrillation; Risk                 Factors:Hypertension, Dyslipidemia and Diabetes.  Sonographer:    Memory Argue Referring Phys: [place]  1. Left ventricular ejection fraction, by estimation, is 60 to 65%. The left ventricle has normal function. The left ventricle has no regional wall motion abnormalities. There is mild left ventricular hypertrophy. Left ventricular diastolic parameters are consistent with Grade I diastolic dysfunction (impaired relaxation).  2. Right ventricular systolic function is normal. The right ventricular size is normal. Tricuspid regurgitation signal is inadequate for assessing PA pressure.  3. The mitral valve is normal in structure. No evidence of mitral valve regurgitation.  4. The aortic valve was not well visualized. Aortic valve regurgitation is not visualized.  5. The inferior vena cava is normal in size with greater than 50% respiratory variability, suggesting right atrial pressure of 3 mmHg. Comparison(s): No significant change from prior study. FINDINGS  Left Ventricle: Left ventricular ejection fraction, by estimation, is 60 to 65%. The left ventricle has normal function. The left ventricle has no regional wall motion abnormalities. The left ventricular internal cavity size was normal in size. There is  mild left ventricular hypertrophy. Left ventricular diastolic parameters are consistent with Grade I diastolic dysfunction (  impaired relaxation). Right Ventricle: The right ventricular size is normal. Right ventricular systolic function is normal. Tricuspid regurgitation signal is inadequate for assessing PA pressure. Left Atrium: Left atrial size was normal in size. Right Atrium: Right atrial size was normal in size. Pericardium: There is no evidence of pericardial effusion. Mitral Valve:  The mitral valve is normal in structure. No evidence of mitral valve regurgitation. Tricuspid Valve: Tricuspid valve regurgitation is not demonstrated. Aortic Valve: The aortic valve was not well visualized. Aortic valve regurgitation is not visualized. Aortic valve mean gradient measures 3.0 mmHg. Aortic valve peak gradient measures 5.6 mmHg. Aortic valve area, by VTI measures 2.64 cm. Pulmonic Valve: Pulmonic valve regurgitation is not visualized. Aorta: The aortic root and ascending aorta are structurally normal, with no evidence of dilitation. Venous: The inferior vena cava is normal in size with greater than 50% respiratory variability, suggesting right atrial pressure of 3 mmHg. IAS/Shunts: The interatrial septum was not well visualized.  LEFT VENTRICLE PLAX 2D LVIDd:         3.70 cm   Diastology LVIDs:         2.70 cm   LV e' medial:    4.68 cm/s LV PW:         1.00 cm   LV E/e' medial:  15.5 LV IVS:        1.20 cm   LV e' lateral:   7.94 cm/s LVOT diam:     1.90 cm   LV E/e' lateral: 9.1 LV SV:         56 LV SV Index:   34 LVOT Area:     2.84 cm  RIGHT VENTRICLE RV S prime:     11.90 cm/s TAPSE (M-mode): 2.0 cm LEFT ATRIUM             Index        RIGHT ATRIUM           Index LA diam:        3.10 cm 1.88 cm/m   RA Area:     11.40 cm LA Vol (A2C):   23.0 ml 13.92 ml/m  RA Volume:   24.20 ml  14.64 ml/m LA Vol (A4C):   28.5 ml 17.24 ml/m LA Biplane Vol: 26.4 ml 15.97 ml/m  AORTIC VALVE AV Area (Vmax):    2.45 cm AV Area (Vmean):   2.45 cm AV Area (VTI):     2.64 cm AV Vmax:           118.00 cm/s AV Vmean:          76.000 cm/s AV VTI:            0.213 m AV Peak Grad:      5.6 mmHg AV Mean Grad:      3.0 mmHg LVOT Vmax:         102.00 cm/s LVOT Vmean:        65.600 cm/s LVOT VTI:          0.198 m LVOT/AV VTI ratio: 0.93  AORTA Ao Root diam: 2.90 cm Ao Asc diam:  3.30 cm MITRAL VALVE MV Area (PHT): 3.53 cm    SHUNTS MV Decel Time: 215 msec    Systemic VTI:  0.20 m MV E velocity: 72.40 cm/s  Systemic  Diam: 1.90 cm MV A velocity: 87.90 cm/s MV E/A ratio:  0.82 Mary Scientist, physiological signed by Phineas Inches Signature Date/Time: 05/28/2022/3:57:11 PM    Final    EEG adult  Result Date: 05/28/2022 Lora Havens, MD     05/28/2022 10:33 AM Patient Name: Heidi [name] MRN: [id number] Epilepsy Attending: Lora Havens Referring Physician/Provider: Lorenza Chick, MD Date: 05/28/2022 Duration: 24.35 mins Patient history: 81 year old female with dizziness.  EEG to evaluate for seizure. Level of alertness: Awake AEDs during EEG study: None Technical aspects: This EEG study was done with scalp electrodes positioned according to the 10-20 International system of electrode placement. Electrical activity was reviewed with band pass filter of 1-'70Hz'$ , sensitivity of 7 uV/mm, display speed of 40m/sec with a '60Hz'$  notched filter applied as appropriate. EEG data were recorded continuously and digitally stored.  Video monitoring was available and reviewed as appropriate. Description: The posterior dominant rhythm consists of 8 Hz activity of moderate voltage (25-35 uV) seen predominantly in posterior head regions, symmetric and reactive to eye opening and eye closing. Hyperventilation and photic stimulation were not performed.   Of note, there was electrode artifact at F8. IMPRESSION: This study is within normal limits. No seizures or epileptiform discharges were seen throughout the recording. A normal interictal EEG does not exclude nor support the diagnosis of epilepsy. PLora Havens  CT C-SPINE NO CHARGE  Result Date: 05/28/2022 CLINICAL DATA:  Increased frequency of fall EXAM: CT CERVICAL SPINE WITHOUT CONTRAST TECHNIQUE: Multidetector CT imaging of the cervical spine was performed without intravenous contrast. Multiplanar CT image reconstructions were also generated. RADIATION DOSE REDUCTION: This exam was performed according to the departmental dose-optimization program which includes automated exposure  control, adjustment of the mA and/or kV according to patient size and/or use of iterative reconstruction technique. COMPARISON:  None Available. FINDINGS: Alignment: Normal. Skull base and vertebrae: No acute fracture. No primary bone lesion or focal pathologic process. Soft tissues and spinal canal: No prevertebral fluid or swelling. No visible canal hematoma. Disc levels:  Left C3-6 facet arthrosis.  No spinal canal stenosis. Upper chest: Negative Other: None. IMPRESSION: No acute fracture or static subluxation of the cervical spine. Electronically Signed   By: KUlyses JarredM.D.   On: 05/28/2022 00:34   CT ANGIO HEAD NECK W WO CM  Result Date: 05/26/2022 CLINICAL DATA:  Diplopia, dizziness EXAM: CT ANGIOGRAPHY HEAD AND NECK TECHNIQUE: Multidetector CT imaging of the head and neck was performed using the standard protocol during bolus administration of intravenous contrast. Multiplanar CT image reconstructions and MIPs were obtained to evaluate the vascular anatomy. Carotid stenosis measurements (when applicable) are obtained utilizing NASCET criteria, using the distal internal carotid diameter as the denominator. RADIATION DOSE REDUCTION: This exam was performed according to the departmental dose-optimization program which includes automated exposure control, adjustment of the mA and/or kV according to patient size and/or use of iterative reconstruction technique. CONTRAST:  72mOMNIPAQUE IOHEXOL 350 MG/ML SOLN COMPARISON:  12/25/2020 CTA head neck; correlation is also made with 01/07/2022 CT head FINDINGS: CT HEAD FINDINGS Brain: No evidence of acute infarct, hemorrhage, mass, mass effect, or midline shift. No hydrocephalus or extra-axial fluid collection. Periventricular white matter changes, likely the sequela of chronic small vessel ischemic disease. Vascular: No hyperdense vessel. Skull: Normal. Negative for fracture or focal lesion. Sinuses/Orbits: No acute finding. Status post bilateral lens  replacements. Other: The mastoid air cells are well aerated. CTA NECK FINDINGS Aortic arch: Standard branching. Imaged portion shows no evidence of aneurysm or dissection. Aortic atherosclerosis. Approximately 40% stenosis of the proximal left subclavian secondary to calcified plaque (series 11, image 274). Right carotid system: Approximately 60% stenosis in the proximal right  ICA, which appears slightly worse than in 2022. No evidence of dissection or occlusion. Left carotid system: Less than 50% stenosis in the proximal left ICA, similar to prior. No evidence of dissection or occlusion Vertebral arteries: No evidence of dissection, occlusion, or hemodynamically significant stenosis (greater than 50%). Skeleton: No acute osseous abnormality. Degenerative changes in the cervical spine. Other neck: Multiple subcentimeter hypoattenuating nodules in the thyroid, for which no follow-up is indicated. (Reference: J Am Coll Radiol. 2015 Feb;12(2): 143-50) Upper chest: Negative. Review of the MIP images confirms the above findings CTA HEAD FINDINGS Anterior circulation: Both internal carotid arteries are patent to the termini, with mild-to-moderate stenosis in the bilateral cavernous and supraclinoid ICAs, which appears to have progressed from the prior exam. A1 segments patent. Normal anterior communicating artery. Anterior cerebral arteries are patent to their distal aspects. No M1 stenosis or occlusion. MCA branches perfused and symmetric. Posterior circulation: The right vertebral artery primarily supplies the right PICA. The left vertebral artery is patent to the vertebrobasilar junction. Posterior inferior cerebellar arteries patent proximally. Basilar patent to its distal aspect. Superior cerebellar arteries patent proximally. Fetal origin of the left PCA and near fetal origin of the right PCA, with a diminutive right P1 and prominent bilateral posterior communicating arteries. PCAs perfused to their distal aspects  with moderate narrowing in the proximal right P2 section (series 11, images 109-110), similar to prior, and otherwise diffuse mild multifocal narrowing in the left-greater-than-right PCA. Venous sinuses: As permitted by contrast timing, patent. Anatomic variants: None significant. Review of the MIP images confirms the above findings IMPRESSION: 1. No intracranial large vessel occlusion. Moderate narrowing in the proximal right P2, mild-to-moderate stenosis in the bilateral cavernous and supraclinoid ICAs, and mild multifocal narrowing in the left-greater-than-right PCA. 2. 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 3.  No acute intracranial process. Electronically Signed   By: Merilyn Baba M.D.   On: 05/26/2022 21:33   DG HIP UNILAT WITH PELVIS 2-3 VIEWS RIGHT  Result Date: 05/26/2022 CLINICAL DATA:  Hip pain.  Multiple falls. EXAM: DG HIP (WITH OR WITHOUT PELVIS) 2-3V RIGHT COMPARISON:  None Available. FINDINGS: No acute fracture of the pelvis or right hip. The femoral head is well seated in the acetabulum. Slight acetabular spurring. Intact pubic rami and bony pelvis. Pubic symphysis and sacroiliac joints are congruent. No evidence of avascular necrosis or focal bone abnormality. IMPRESSION: No fracture of the pelvis or right hip. Electronically Signed   By: Keith Rake M.D.   On: 05/26/2022 21:18   DG Shoulder Right Port  Result Date: 05/02/2022 CLINICAL DATA:  Increased right shoulder pain since surgery 1 day ago. EXAM: RIGHT SHOULDER - 1 VIEW COMPARISON:  Right shoulder radiographs 05/01/2022 FINDINGS: Postsurgical changes are again seen of reverse total right shoulder arthroplasty. No perihardware lucency is seen to indicate hardware failure or loosening. Normal alignment. There is again postoperative air within the subacromial/subdeltoid bursa. Mild degenerative changes of the acromioclavicular joint. Right  chest wall cardiac pacer. IMPRESSION: Status post recent reverse total right shoulder arthroplasty without evidence of hardware failure. Electronically Signed   By: Yvonne Kendall M.D.   On: 05/02/2022 17:25   DG Shoulder Right Port  Result Date: 05/01/2022 CLINICAL DATA:  Postoperative total right shoulder replacement. EXAM: RIGHT SHOULDER - 1 VIEW COMPARISON:  Right shoulder radiographs 01/07/2022 and CT right shoulder 03/25/2022 FINDINGS: Interval reverse total right shoulder arthroplasty. No perihardware  lucency is seen to indicate hardware failure or loosening. Expected postoperative changes including lateral shoulder subcutaneous air and soft tissue swelling. No acute fracture or dislocation. Mild acromioclavicular joint space narrowing and peripheral osteophytosis. Right chest wall cardiac generator with leads extending towards the heart. Mild-to-moderate calcifications within the aortic arch. IMPRESSION: Interval reverse total right shoulder arthroplasty without evidence of hardware failure. Electronically Signed   By: Yvonne Kendall M.D.   On: 05/01/2022 09:55      Subjective:  No significant events overnight, she denies any complaints Discharge Exam: Vitals:   05/30/22 0800 05/30/22 0842  BP: (!) 121/56 127/71  Pulse: 79 88  Resp:  18  Temp:    SpO2: 98%    Vitals:   05/30/22 0033 05/30/22 0400 05/30/22 0800 05/30/22 0842  BP:  92/72 (!) 121/56 127/71  Pulse: 78 65 79 88  Resp: '19 16  18  '$ Temp: 97.9 F (36.6 C) 98.3 F (36.8 C)    TempSrc: Oral Oral    SpO2: 95% 96% 98%   Weight:      Height:        General: Pt is alert, awake, not in acute distress Cardiovascular: RRR, S1/S2 +, no rubs, no gallops Respiratory: CTA bilaterally, no wheezing, no rhonchi Abdominal: Soft, NT, ND, bowel sounds + Extremities: no edema, no cyanosis    The results of significant diagnostics from this hospitalization (including imaging, microbiology, ancillary and laboratory) are listed below  for reference.     Microbiology: No results found for this or any previous visit (from the past 240 hour(s)).   Labs: BNP (last 3 results) No results for input(s): "BNP" in the last 8760 hours. Basic Metabolic Panel: Recent Labs  Lab 05/26/22 1832 05/27/22 1925 05/30/22 0359  NA 135 137 137  K 4.9 4.9 4.1  CL 101 107 109  CO2 25 24 21*  GLUCOSE 108* 123* 120*  BUN 28* 18 32*  CREATININE 1.18* 1.02* 1.01*  CALCIUM 10.0 9.9 9.8  MG  --  1.6* 2.0  PHOS  --  2.9 4.1   Liver Function Tests: Recent Labs  Lab 05/27/22 1925  AST 18  ALT 18  ALKPHOS 93  BILITOT 0.5  PROT 6.3*  ALBUMIN 3.6   No results for input(s): "LIPASE", "AMYLASE" in the last 168 hours. Recent Labs  Lab 05/28/22 1153  AMMONIA 27   CBC: Recent Labs  Lab 05/26/22 1832 05/27/22 1925  WBC 7.7 8.0  NEUTROABS  --  3.7  HGB 12.2 11.8*  HCT 38.0 36.4  MCV 89.4 87.5  PLT 283 267   Cardiac Enzymes: Recent Labs  Lab 05/27/22 1925  CKTOTAL 29*   BNP: Invalid input(s): "POCBNP" CBG: Recent Labs  Lab 05/29/22 0838 05/29/22 1127 05/29/22 1700 05/29/22 2045 05/30/22 0910  GLUCAP 119* 163* 149* 162* 135*   D-Dimer No results for input(s): "DDIMER" in the last 72 hours. Hgb A1c Recent Labs    05/28/22 1153  HGBA1C 6.1*   Lipid Profile Recent Labs    05/27/22 2238  CHOL 206*  HDL 60  LDLCALC 112*  TRIG 171*  CHOLHDL 3.4   Thyroid function studies Recent Labs    05/27/22 1925  TSH 5.134*   Anemia work up Recent Labs    05/27/22 2238  VITAMINB12 276   Urinalysis    Component Value Date/Time   COLORURINE COLORLESS (A) 05/26/2022 1820   APPEARANCEUR CLEAR 05/26/2022 1820   LABSPEC 1.011 05/26/2022 [place] 5.5 05/26/2022 1820  GLUCOSEU NEGATIVE 05/26/2022 [place] 05/26/2022 1820   BILIRUBINUR NEGATIVE 05/26/2022 1820   KETONESUR NEGATIVE 05/26/2022 1820   PROTEINUR NEGATIVE 05/26/2022 1820   UROBILINOGEN 1.0 10/17/2010 1558   NITRITE NEGATIVE  05/26/2022 1820   LEUKOCYTESUR NEGATIVE 05/26/2022 1820   Sepsis Labs Recent Labs  Lab 05/26/22 1832 05/27/22 1925  WBC 7.7 8.0   Microbiology No results found for this or any previous visit (from the past 240 hour(s)).   Time coordinating discharge: Over 30 minutes  SIGNED:   Phillips Climes, MD  [facility] 05/30/2022, 12:16 PM Pager   If 7PM-7AM, please contact night-coverage [email or web address]

## 2022-05-30 NOTE — Progress Notes (Signed)
Physical Therapy Treatment Patient Details Name: [name] MRN: [id number] DOB: [birth year] Today's Date: 05/30/2022   History of Present Illness Pt is a 81 yr old female who presented 05/26/22 due to multiple falls and diplopia. Pt pending MRI. Pt did have on  05/01/22 R revers shoulder arthoplasty  and per chart of to come out of sling for RW use with PWB. PMH: HTN, HLD, depression, GERD, glaucoma, atrial fibrillation not on anticoagulation, pacemaker CAD, CKD, OSA , mild dementia.    PT Comments    Pt up in chair upon PT arrival to room, agreeable to PT session. Pt ambulatory for x2 short hallway distances today, overall requiring close guard for safety as well as safety cuing during. Pt experiencing dizziness and nausea in hallway, especially with head turns, which improved with pt focusing eyes on one object in front of her and seated rest. Pt with increasing anxiety throughout session, again states she is "terrified" of falling. PT to continue to follow.     Recommendations for follow up therapy are one component of a multi-disciplinary discharge planning process, led by the attending physician.  Recommendations may be updated based on patient status, additional functional criteria and insurance authorization.  Follow Up Recommendations  Skilled nursing-short term rehab (<3 hours/day) Can patient physically be transported by private vehicle: Yes   Assistance Recommended at Discharge Intermittent Supervision/Assistance  Patient can return home with the following A little help with walking and/or transfers;A little help with bathing/dressing/bathroom   Equipment Recommendations  None recommended by PT    Recommendations for Other Services       Precautions / Restrictions Precautions Precautions: Fall Restrictions RUE Weight Bearing: Partial weight bearing RUE Partial Weight Bearing Percentage or Pounds: <5 lbs     Mobility  Bed Mobility               General bed  mobility comments: up in the recliner    Transfers Overall transfer level: Needs assistance Equipment used: Rollator (4 wheels) Transfers: Sit to/from Stand Sit to Stand: Min guard           General transfer comment: for safety, STS x4 from recliner x2, toilet x1, rollator x1.    Ambulation/Gait Ambulation/Gait assistance: Min guard Gait Distance (Feet): 50 Feet (x2 - seated rest break) Assistive device: Rollator (4 wheels) Gait Pattern/deviations: Step-through pattern, Decreased stride length, Trunk flexed Gait velocity: decr     General Gait Details: cues for focusing on one object in front of her during hallway gait given dizziness and nausea onset with horizontal head turns (keeping head still improved s/s dizziness).   Stairs             Wheelchair Mobility    Modified Rankin (Stroke Patients Only)       Balance Overall balance assessment: Needs assistance Sitting-balance support: Feet supported Sitting balance-Leahy Scale: Good     Standing balance support: Reliant on assistive device for balance Standing balance-Leahy Scale: Poor                              Cognition Arousal/Alertness: Awake/alert Behavior During Therapy: WFL for tasks assessed/performed Overall Cognitive Status: History of cognitive impairments - at baseline                                 General Comments: mild dementia at baseline, anxiety worsens command following  Exercises      General Comments        Pertinent Vitals/Pain Pain Assessment Pain Assessment: Faces Faces Pain Scale: Hurts a little bit Pain Location: R shoulder during mobilty Pain Descriptors / Indicators: Discomfort Pain Intervention(s): Limited activity within patient's tolerance, Monitored during session, Repositioned    Home Living Family/patient expects to be discharged to:: Assisted living                        Prior Function            PT  Goals (current goals can now be found in the care plan section) Acute Rehab PT Goals PT Goal Formulation: With patient Time For Goal Achievement: 06/11/22 Potential to Achieve Goals: Good Progress towards PT goals: Progressing toward goals    Frequency    Min 3X/week      PT Plan Current plan remains appropriate    Co-evaluation              AM-PAC PT "6 Clicks" Mobility   Outcome Measure  Help needed turning from your back to your side while in a flat bed without using bedrails?: A Little Help needed moving from lying on your back to sitting on the side of a flat bed without using bedrails?: A Little Help needed moving to and from a bed to a chair (including a wheelchair)?: A Little Help needed standing up from a chair using your arms (e.g., wheelchair or bedside chair)?: A Little Help needed to walk in hospital room?: A Little Help needed climbing 3-5 steps with a railing? : A Lot 6 Click Score: 17    End of Session Equipment Utilized During Treatment: Gait belt Activity Tolerance: Patient tolerated treatment well Patient left: in chair;with call bell/phone within reach;with chair alarm set Nurse Communication: Mobility status PT Visit Diagnosis: Other abnormalities of gait and mobility (R26.89);Muscle weakness (generalized) (M62.81)     Time: 6389-3734 PT Time Calculation (min) (ACUTE ONLY): 18 min  Charges:  $Therapeutic Activity: 8-22 mins                    Heidi [name], PT DPT Acute Rehabilitation Services Pager [phone]  Office [phone]    Heidi [name] 05/30/2022, 11:35 AM

## 2022-05-30 NOTE — TOC Transition Note (Signed)
Transition of Care [facility]) - CM/SW Discharge Note   Patient Details  Name: Heidi [name] MRN: [id number] Date of Birth: Sep 14, 1941  Transition of Care [facility]) CM/SW Contact:  Coralee Pesa, [place] Phone Number: 05/30/2022, 2:03 PM   Clinical Narrative:    Pt to be transported to [facility] via [place]. Nurse to call report to [phone]. Pt will go to the [facility], Rm 806. Holding discharge until insurance approval received.   Final next level of care: Skilled Nursing Facility Barriers to Discharge: Insurance Authorization   Patient Goals and CMS Choice Patient states their goals for this hospitalization and ongoing recovery are:: Pt would like to get stronger and be able to return to ALF. CMS Medicare.gov Compare Post Acute Care list provided to:: Patient Choice offered to / list presented to : Patient, Adult Children  Discharge Placement              Patient chooses bed at: [facility] Patient to be transferred to facility by: [place] Name of family member notified: [name] Patient and family notified of of transfer: 05/30/22  Discharge Plan and Services     Post Acute Care Choice: [place]                               Social Determinants of Health (SDOH) Interventions     Readmission Risk Interventions     No data to display

## 2022-05-30 NOTE — Progress Notes (Signed)
PTAR picked pt up at this time to take to rehab facility/SNF Heidi [name]. Daughter took most of pt belongings including her rolllator walker. Other belongings sent with pt and PTAR.  Unable to call report to facility at this time, number given to [facility] on pt information packet requesting them to call back for report.

## 2022-05-30 NOTE — Progress Notes (Signed)
Occupational Therapy Treatment Patient Details Name: Heidi [name] MRN: [id number] DOB: 05-Jan-1941 Today's Date: 05/30/2022   History of present illness Pt is a 81 yr old female who presented 05/26/22 due to multiple falls and diplopia. Pt pending MRI. Pt did have on  05/01/22 R revers shoulder arthoplasty  and per chart of to come out of sling for RW use with PWB. PMH: HTN, HLD, depression, GERD, glaucoma, atrial fibrillation not on anticoagulation, pacemaker CAD, CKD, OSA , mild dementia.   OT comments  Patient continues to feel more confident with in room mobility.  Able to participate with stand grooming with supervision, toileting task with up to [facility], and upper body HEP described below.  OT to continue efforts with SNF recommended for post acute to maximize her functional abilities prior to returning home.     Recommendations for follow up therapy are one component of a multi-disciplinary discharge planning process, led by the attending physician.  Recommendations may be updated based on patient status, additional functional criteria and insurance authorization.    Follow Up Recommendations  Skilled nursing-short term rehab (<3 hours/day)    Assistance Recommended at Discharge Frequent or constant Supervision/Assistance  Patient can return home with the following  A little help with walking and/or transfers;A little help with bathing/dressing/bathroom;Assistance with cooking/housework;Assistance with feeding;Direct supervision/assist for medications management;Direct supervision/assist for financial management;Assist for transportation   Equipment Recommendations  None recommended by OT    Recommendations for Other Services      Precautions / Restrictions Precautions Precautions: Fall Restrictions RUE Weight Bearing: Partial weight bearing RUE Partial Weight Bearing Percentage or Pounds: <5 lbs       Mobility Bed Mobility Overal bed mobility: Needs Assistance Bed Mobility:  Supine to Sit     Supine to sit: Supervision          Transfers Overall transfer level: Needs assistance Equipment used: Rollator (4 wheels) Transfers: Sit to/from Stand Sit to Stand: Supervision                 Balance Overall balance assessment: Needs assistance Sitting-balance support: Feet supported Sitting balance-Leahy Scale: Good     Standing balance support: Reliant on assistive device for balance Standing balance-Leahy Scale: Poor Standing balance comment: patient continues to express fear of falling, generalized dizziness, but is more confident with mobility                           ADL either performed or assessed with clinical judgement   ADL       Grooming: Standing;Min guard           Upper Body Dressing : Set up;Sitting   Lower Body Dressing: Min guard;Sit to/from stand   Toilet Transfer: Min guard;Ambulation;Rolling walker (2 wheels)   Toileting- Clothing Manipulation and Hygiene: Supervision/safety;Sitting/lateral lean;Sit to/from stand              Extremity/Trunk Assessment Upper Extremity Assessment RUE Deficits / Details: R shoulder flexion 2+/5.  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   Perception Perception Perception: Within Functional Limits   Praxis Praxis  Praxis: Intact    Cognition Arousal/Alertness: Awake/alert Behavior During Therapy: WFL for tasks assessed/performed Overall Cognitive Status: Within Functional Limits for tasks assessed                                           Exercises Other Exercises Other Exercises: AA chest press: 1 set and 10 reps supine Other Exercises: AA front raise: 1 set and 10 reps supine Other Exercises: AA front circles forward and backwards: 2 set and 10 reps supine    Shoulder Instructions       General Comments      Pertinent Vitals/ Pain       Pain Assessment Pain Assessment: Faces Faces Pain Scale: Hurts little more Pain Location: R shoulder with AA ROM Pain Descriptors / Indicators: Discomfort Pain Intervention(s): Monitored during session  Home Living Family/patient expects to be discharged to:: Assisted living                                        Prior Functioning/Environment              Frequency  Min 2X/week        Progress Toward Goals  OT Goals(current goals can now be found in the care plan section)  Progress towards OT goals: Progressing toward goals  Acute Rehab OT Goals OT Goal Formulation: With patient Time For Goal Achievement: 06/11/22 Potential to Achieve Goals: Good  Plan Discharge plan remains appropriate    Co-evaluation                 AM-PAC OT "6 Clicks" Daily Activity     Outcome Measure   Help from another person eating meals?: None Help from another person taking care of personal grooming?: A Little Help from another person toileting, which includes using toliet, bedpan, or urinal?: A Little Help from another person bathing (including washing, rinsing, drying)?: A Little Help from another person to put on and taking off regular upper body clothing?: A Little Help from another person to put on and taking off regular lower body clothing?: A Little 6 Click Score: 19    End of Session Equipment Utilized During Treatment: Gait belt;Rolling walker (2 wheels)  OT Visit Diagnosis: Unsteadiness on feet (R26.81);Other abnormalities of gait and mobility (R26.89);Repeated falls (R29.6);Muscle weakness (generalized) (M62.81);History of falling  (Z91.81);Dizziness and giddiness (R42);Pain Pain - Right/Left: Right Pain - part of body: Shoulder   Activity Tolerance Patient tolerated treatment well   Patient Left in chair;with call bell/phone within reach;with chair alarm set;with nursing/sitter in room   Nurse Communication Mobility status        Time: 6948-5462 OT Time Calculation (min): 22 min  Charges: OT General Charges $OT Visit: 1 Visit OT Treatments $Self Care/Home Management : 8-22 mins  05/30/2022  RP, OTR/L  Acute Rehabilitation Services  Office:  [phone]   Metta Clines 05/30/2022, 8:54 AM

## 2022-05-31 LAB — METHYLMALONIC ACID, SERUM: Methylmalonic Acid, Quantitative: 223 nmol/L (ref 0–378)

## 2022-06-03 DIAGNOSIS — E119 Type 2 diabetes mellitus without complications: Secondary | ICD-10-CM | POA: Diagnosis not present

## 2022-06-03 DIAGNOSIS — I4891 Unspecified atrial fibrillation: Secondary | ICD-10-CM | POA: Diagnosis not present

## 2022-06-03 DIAGNOSIS — I10 Essential (primary) hypertension: Secondary | ICD-10-CM | POA: Diagnosis not present

## 2022-06-03 DIAGNOSIS — N189 Chronic kidney disease, unspecified: Secondary | ICD-10-CM | POA: Diagnosis not present

## 2022-06-05 ENCOUNTER — Ambulatory Visit (INDEPENDENT_AMBULATORY_CARE_PROVIDER_SITE_OTHER): Payer: Medicare Other

## 2022-06-05 DIAGNOSIS — I495 Sick sinus syndrome: Secondary | ICD-10-CM | POA: Diagnosis not present

## 2022-06-09 LAB — CUP PACEART REMOTE DEVICE CHECK
Battery Impedance: 1738 Ohm
Battery Remaining Longevity: 46 mo
Battery Voltage: 2.76 V
Brady Statistic AP VP Percent: 0 %
Brady Statistic AP VS Percent: 3 %
Brady Statistic AS VP Percent: 0 %
Brady Statistic AS VS Percent: 97 %
Date Time Interrogation Session: 20230830124628
Implantable Lead Implant Date: 20070710
Implantable Lead Implant Date: 20070710
Implantable Lead Location: 753859
Implantable Lead Location: 753860
Implantable Lead Model: 4469
Implantable Lead Model: 4470
Implantable Lead Serial Number: 483166
Implantable Lead Serial Number: 541525
Implantable Pulse Generator Implant Date: 20130410
Lead Channel Impedance Value: 593 Ohm
Lead Channel Impedance Value: 650 Ohm
Lead Channel Pacing Threshold Amplitude: 0.75 V
Lead Channel Pacing Threshold Amplitude: 0.75 V
Lead Channel Pacing Threshold Pulse Width: 0.4 ms
Lead Channel Pacing Threshold Pulse Width: 0.4 ms
Lead Channel Setting Pacing Amplitude: 2 V
Lead Channel Setting Pacing Amplitude: 2.5 V
Lead Channel Setting Pacing Pulse Width: 0.4 ms
Lead Channel Setting Sensing Sensitivity: 5.6 mV

## 2022-06-20 DIAGNOSIS — R296 Repeated falls: Secondary | ICD-10-CM | POA: Diagnosis not present

## 2022-06-21 DIAGNOSIS — R296 Repeated falls: Secondary | ICD-10-CM | POA: Diagnosis not present

## 2022-06-24 DIAGNOSIS — R296 Repeated falls: Secondary | ICD-10-CM | POA: Diagnosis not present

## 2022-06-25 DIAGNOSIS — E785 Hyperlipidemia, unspecified: Secondary | ICD-10-CM | POA: Diagnosis not present

## 2022-06-25 DIAGNOSIS — E559 Vitamin D deficiency, unspecified: Secondary | ICD-10-CM | POA: Diagnosis not present

## 2022-06-25 DIAGNOSIS — I10 Essential (primary) hypertension: Secondary | ICD-10-CM | POA: Diagnosis not present

## 2022-06-25 DIAGNOSIS — E039 Hypothyroidism, unspecified: Secondary | ICD-10-CM | POA: Diagnosis not present

## 2022-06-25 DIAGNOSIS — R7303 Prediabetes: Secondary | ICD-10-CM | POA: Diagnosis not present

## 2022-06-26 DIAGNOSIS — R296 Repeated falls: Secondary | ICD-10-CM | POA: Diagnosis not present

## 2022-06-27 NOTE — Progress Notes (Signed)
Remote pacemaker transmission.   

## 2022-07-01 DIAGNOSIS — R296 Repeated falls: Secondary | ICD-10-CM | POA: Diagnosis not present

## 2022-07-03 DIAGNOSIS — R296 Repeated falls: Secondary | ICD-10-CM | POA: Diagnosis not present

## 2022-07-05 ENCOUNTER — Ambulatory Visit: Payer: Medicare Other | Admitting: Neurology

## 2022-07-05 DIAGNOSIS — R296 Repeated falls: Secondary | ICD-10-CM | POA: Diagnosis not present

## 2022-07-08 DIAGNOSIS — R296 Repeated falls: Secondary | ICD-10-CM | POA: Diagnosis not present

## 2022-07-10 DIAGNOSIS — R296 Repeated falls: Secondary | ICD-10-CM | POA: Diagnosis not present

## 2022-07-15 DIAGNOSIS — R296 Repeated falls: Secondary | ICD-10-CM | POA: Diagnosis not present

## 2022-07-15 DIAGNOSIS — M6281 Muscle weakness (generalized): Secondary | ICD-10-CM | POA: Diagnosis not present

## 2022-07-15 DIAGNOSIS — R42 Dizziness and giddiness: Secondary | ICD-10-CM | POA: Diagnosis not present

## 2022-07-15 DIAGNOSIS — R4701 Aphasia: Secondary | ICD-10-CM | POA: Diagnosis not present

## 2022-07-15 DIAGNOSIS — H9313 Tinnitus, bilateral: Secondary | ICD-10-CM | POA: Diagnosis not present

## 2022-07-15 DIAGNOSIS — H903 Sensorineural hearing loss, bilateral: Secondary | ICD-10-CM | POA: Diagnosis not present

## 2022-07-16 DIAGNOSIS — M19011 Primary osteoarthritis, right shoulder: Secondary | ICD-10-CM | POA: Diagnosis not present

## 2022-07-16 DIAGNOSIS — M6281 Muscle weakness (generalized): Secondary | ICD-10-CM | POA: Diagnosis not present

## 2022-07-17 ENCOUNTER — Ambulatory Visit: Payer: Medicare Other | Admitting: Neurology

## 2022-07-17 ENCOUNTER — Encounter: Payer: Self-pay | Admitting: Neurology

## 2022-07-17 VITALS — BP 147/83 | HR 91 | Ht 61.25 in | Wt 148.0 lb

## 2022-07-17 DIAGNOSIS — R471 Dysarthria and anarthria: Secondary | ICD-10-CM | POA: Diagnosis not present

## 2022-07-17 DIAGNOSIS — H02403 Unspecified ptosis of bilateral eyelids: Secondary | ICD-10-CM

## 2022-07-17 DIAGNOSIS — H532 Diplopia: Secondary | ICD-10-CM

## 2022-07-17 DIAGNOSIS — R296 Repeated falls: Secondary | ICD-10-CM | POA: Diagnosis not present

## 2022-07-17 MED ORDER — PYRIDOSTIGMINE BROMIDE 60 MG PO TABS: ORAL_TABLET | ORAL | 5 refills | Status: DC

## 2022-07-17 NOTE — Progress Notes (Signed)
Follow-up Visit   Date: 07/17/2022    Heidi [name] MRN: [id number] DOB: 09/11/1941    Heidi [name] is a 81 y.o. 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  The patient was accompanied to the clinic by daughter who also provides collateral information.     IMPRESSION/PLAN: Intermittent dysarthria, ptosis, diplopia, and dysphagia concerning for neuromuscular junction disorder, such as myasthenia gravis.  AChR antibodies are negative.  I discussed getting electrodiagnostic testing with repetitive nerve stimulation to further investigate symptoms or offer a trial of mestinon.  She opted for the latter.   Start mestinon '60mg'$  - 1 tablet at 9a and 2p.  Side effects discussed Update in 1-2 weeks  Ocular migraine.  She is already on nortriptyline '100mg'$  qhs and gabapentin '300mg'$  TID which are used for headache prevention.  At this time, she would like to continue to monitor instead of adding more medications  RTC in 3 months   --------------------------------------------- UPDATE 07/17/2022:  She was hospitalized in August for falls and hallucinations, found to be associated with her dementia.  She was discharged to rehab and now back at [facility].  She was falling frequently prior to her hospitalization, but reports no falls over the past 2 months.  Since being home, she has noticed difficulty with speaking and words being slurred, especially as the day progresses.  She continues to have intermittent diplopia and wears prisms.  Eyelids still can be droopy, but slightly better than before.  Sometimes, her food gets stuck in her throat.  No arms or leg weakness.   She ha spells of visual distortion, described as if a jug-saw puzzle pieces are moving.  This occurs most days of the week.  She also reports having headaches at the left base of her  head.   Medications:  Current Outpatient Medications on File Prior to Visit  Medication Sig Dispense Refill   acetaminophen (TYLENOL) 325 MG tablet Take 2 tablets (650 mg total) by mouth every 6 (six) hours as needed for mild pain (or Fever >/= 101).     benzocaine-menthol (CHLORASEPTIC SORE THROAT) 6-10 MG lozenge Take 1 lozenge by mouth every 2 (two) hours as needed for sore throat.     Cholecalciferol (VITAMIN D-3) 125 MCG (5000 UT) TABS Take 5,000 Units by mouth daily.     cholestyramine (QUESTRAN) 4 g packet Take 4 g by mouth daily.  6   cyanocobalamin 1000 MCG tablet Take 1 tablet (1,000 mcg total) by mouth daily.     feeding supplement (ENSURE ENLIVE / ENSURE PLUS) LIQD Take 237 mLs by mouth daily. 237 mL 12   fenofibrate 160 MG tablet Take 160 mg by mouth daily. Take with food     ferrous sulfate 325 (65 FE) MG tablet Take 325 mg by mouth daily.     fexofenadine-pseudoephedrine (ALLEGRA-D) 60-120 MG 12 hr tablet Take 1 tablet by mouth 2 (two) times daily.     fluticasone (FLONASE) 50 MCG/ACT nasal spray Place 1 spray into the nose 2 (two) times daily.     furosemide (LASIX) 20 MG tablet TAKE 1 TABLET BY MOUTH 3 TIMES A WEEK. (Patient taking differently: Take 20 mg by mouth every Monday, Wednesday, and Friday.) 45 tablet 3   gabapentin (NEURONTIN) 300 MG capsule Take 300 mg by mouth 3 (three) times daily.     galantamine (RAZADYNE ER) 8 MG 24 hr  capsule Take 8 mg by mouth daily.  2   hydrOXYzine (ATARAX/VISTARIL) 25 MG tablet Take 25 mg by mouth at bedtime.     levothyroxine (SYNTHROID, LEVOTHROID) 75 MCG tablet Take 75 mcg by mouth daily.     melatonin 3 MG TABS tablet Take 1 tablet (3 mg total) by mouth at bedtime.  0   metFORMIN (GLUCOPHAGE) 500 MG tablet Take 500 mg by mouth 2 (two) times daily.     metoprolol succinate (TOPROL-XL) 100 MG 24 hr tablet TAKE 1 TABLET(100 MG) BY MOUTH DAILY 90 tablet 3   Multiple Vitamins-Minerals (ADULT GUMMY PO) Take 1 capsule by mouth daily.      niacin (VITAMIN B3) 100 MG tablet Take 1 tablet (100 mg total) by mouth at bedtime.     nortriptyline (PAMELOR) 50 MG capsule Take 2 capsules (100 mg total) by mouth at bedtime.     pantoprazole (PROTONIX) 40 MG tablet Take 40 mg by mouth 2 (two) times daily.     PRESCRIPTION MEDICATION Inhale into the lungs at bedtime. CPAP     sodium chloride (OCEAN) 0.65 % SOLN nasal spray Place 1 spray into both nostrils every 2 (two) hours as needed for congestion.     spironolactone (ALDACTONE) 25 MG tablet Take 1 tablet (25 mg total) by mouth daily. (Patient taking differently: Take 25 mg by mouth at bedtime.) 30 tablet 11   ezetimibe (ZETIA) 10 MG tablet Take 1 tablet (10 mg total) by mouth daily. (Patient not taking: Reported on 07/17/2022)     methocarbamol (ROBAXIN) 500 MG tablet Take 1 tablet (500 mg total) by mouth every 8 (eight) hours as needed for muscle spasms. 20 tablet 0   traMADol (ULTRAM) 50 MG tablet Take 1 tablet (50 mg total) by mouth every 8 (eight) hours as needed for severe pain. (Patient not taking: Reported on 07/17/2022) 15 tablet 0   No current facility-administered medications on file prior to visit.    Allergies:  Allergies  Allergen Reactions   Crestor [Rosuvastatin Calcium] Other (See Comments)    Caused Myalgias   Effexor [Venlafaxine Hydrochloride] Other (See Comments) and Hypertension    Caused BP to go up   Aricept [Donepezil]     Other reaction(s): diarrhea   Latex     Other reaction(s): rash   Nardil [Phenelzine]     Other reaction(s): HTN   Venlafaxine     BP problems Other reaction(s): Unkown   Atorvastatin Rash    Other reaction(s): rash   Clopidogrel Rash   Hibiclens [Chlorhexidine Gluconate] Hives   Lipitor [Atorvastatin Calcium] Rash   Lisinopril Rash    Other reaction(s): rash   Nardil Other (See Comments)    Caused BP Problems   Penicillins Swelling and Other (See Comments)    Tolerated Cephalosporin Date: 05/01/22     Plavix [Clopidogrel  Bisulfate] Rash   Simvastatin Rash    Other reaction(s): rash   Sulfa Antibiotics Rash   Sulfonamide Derivatives Rash   Tape Rash    Other reaction(s): rash    Vital Signs:  BP (!) 147/83   Pulse 91   Ht 5' 1.25" (1.556 m)   Wt 148 lb (67.1 kg)   SpO2 100%   BMI 27.74 kg/m     Neurological Exam: MENTAL STATUS including orientation to time, place, person, recent and remote memory, attention span and concentration, language, and fund of knowledge is normal.  Speech is mildly dysarthric.  CRANIAL NERVES:  No visual field defects.  Pupils  equal round and reactive to light.  Normal conjugate, extra-ocular eye movements in all directions of gaze. Mild left ptosis, no worsening with sustained upgaze .  Face is symmetric. Palate elevates symmetrically.  Tongue is midline.  MOTOR:  Motor strength is 5/5 in all extremities, no fatigability.  No atrophy, fasciculations or abnormal movements.  No pronator drift.  Tone is normal.    MSRs:  Reflexes are 2+/4 in the arms and absent in the legs.  SENSORY:  Intact to vibration at the knees, absent below the ankles.  COORDINATION/GAIT:  Normal finger-to- nose-finger.  Gait stable assisted with walker   Data: CT head 05/29/2022:  negative  Lab Results  Component Value Date   TSH 5.134 (H) 05/27/2022    Lab Results  Component Value Date   HGBA1C 6.1 (H) 05/28/2022     Thank you for allowing me to participate in patient's care.  If I can answer any additional questions, I would be pleased to do so.    Sincerely,    Jericha Bryden K. Posey Pronto, DO

## 2022-07-17 NOTE — Patient Instructions (Signed)
Start mestinon '60mg'$  1 tablet at 9am and 2pm.  Call/MyChart message in 1-2 weeks with an update  Return to clinic in 3 months

## 2022-07-18 DIAGNOSIS — M6281 Muscle weakness (generalized): Secondary | ICD-10-CM | POA: Diagnosis not present

## 2022-07-22 DIAGNOSIS — G3184 Mild cognitive impairment, so stated: Secondary | ICD-10-CM | POA: Diagnosis not present

## 2022-07-23 DIAGNOSIS — M6281 Muscle weakness (generalized): Secondary | ICD-10-CM | POA: Diagnosis not present

## 2022-07-25 DIAGNOSIS — M6281 Muscle weakness (generalized): Secondary | ICD-10-CM | POA: Diagnosis not present

## 2022-07-26 DIAGNOSIS — M6281 Muscle weakness (generalized): Secondary | ICD-10-CM | POA: Diagnosis not present

## 2022-07-29 ENCOUNTER — Encounter: Payer: Self-pay | Admitting: Neurology

## 2022-07-29 DIAGNOSIS — R296 Repeated falls: Secondary | ICD-10-CM | POA: Diagnosis not present

## 2022-07-30 DIAGNOSIS — M6281 Muscle weakness (generalized): Secondary | ICD-10-CM | POA: Diagnosis not present

## 2022-07-31 ENCOUNTER — Ambulatory Visit: Payer: Medicare Other | Admitting: Neurology

## 2022-07-31 ENCOUNTER — Other Ambulatory Visit (INDEPENDENT_AMBULATORY_CARE_PROVIDER_SITE_OTHER): Payer: Medicare Other

## 2022-07-31 ENCOUNTER — Encounter: Payer: Self-pay | Admitting: Neurology

## 2022-07-31 VITALS — BP 150/80 | HR 84 | Ht 61.25 in | Wt 147.0 lb

## 2022-07-31 DIAGNOSIS — R471 Dysarthria and anarthria: Secondary | ICD-10-CM | POA: Diagnosis not present

## 2022-07-31 DIAGNOSIS — H532 Diplopia: Secondary | ICD-10-CM | POA: Diagnosis not present

## 2022-07-31 DIAGNOSIS — H02403 Unspecified ptosis of bilateral eyelids: Secondary | ICD-10-CM

## 2022-07-31 DIAGNOSIS — R296 Repeated falls: Secondary | ICD-10-CM | POA: Diagnosis not present

## 2022-07-31 MED ORDER — PYRIDOSTIGMINE BROMIDE 60 MG PO TABS
ORAL_TABLET | ORAL | 5 refills | Status: DC
Start: 2022-07-31 — End: 2022-10-30

## 2022-07-31 NOTE — Patient Instructions (Addendum)
Start mestinon '60mg'$  at 8am, noon, and 4pm.  Check labs  EMG of the right arm and leg  Do not take mestinon in the morning on the day of testing  ELECTROMYOGRAM AND NERVE CONDUCTION STUDIES (EMG/NCS) INSTRUCTIONS  How to Prepare The neurologist conducting the EMG will need to know if you have certain medical conditions. Tell the neurologist and other EMG lab personnel if you: Have a pacemaker or any other electrical medical device Take blood-thinning medications Have hemophilia, a blood-clotting disorder that causes prolonged bleeding Bathing Take a shower or bath shortly before your exam in order to remove oils from your skin. Don't apply lotions or creams before the exam.  What to Expect You'll likely be asked to change into a hospital gown for the procedure and lie down on an examination table. The following explanations can help you understand what will happen during the exam.  Electrodes. The neurologist or a technician places surface electrodes at various locations on your skin depending on where you're experiencing symptoms. Or the neurologist may insert needle electrodes at different sites depending on your symptoms.  Sensations. The electrodes will at times transmit a tiny electrical current that you may feel as a twinge or spasm. The needle electrode may cause discomfort or pain that usually ends shortly after the needle is removed. If you are concerned about discomfort or pain, you may want to talk to the neurologist about taking a short break during the exam.  Instructions. During the needle EMG, the neurologist will assess whether there is any spontaneous electrical activity when the muscle is at rest - activity that isn't present in healthy muscle tissue - and the degree of activity when you slightly contract the muscle.  He or she will give you instructions on resting and contracting a muscle at appropriate times. Depending on what muscles and nerves the neurologist is examining,  he or she may ask you to change positions during the exam.  After your EMG You may experience some temporary, minor bruising where the needle electrode was inserted into your muscle. This bruising should fade within several days. If it persists, contact your primary care doctor.

## 2022-07-31 NOTE — Progress Notes (Signed)
Follow-up Visit   Date: 07/31/2022    Heidi [name] MRN: [id number] DOB: [birth year]    Heidi [name] is a 81 y.o. 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  The patient was accompanied to the clinic by daughter who also provides collateral information.     IMPRESSION/PLAN: Dysarthria, diplopia, ptosis, and dysphagia most concerning for myasthenia gravis.  AChR antibodies are negative.  However, she has responded to a trial of mestinon.  Her exam shows mild ptosis and otherwise motor strength is intact.  For definitive diagnosis, NCS/EMG with RNS was recommended.  Check MUSK antibody  I will also increase mestinon to '60mg'$  three times daily at 8a, noon, and 4pm. If EMG is confirmatory for NMJ disorder, then prednisone will be started.  RTC in 2 weeks   --------------------------------------------- UPDATE 07/17/2022:  She was hospitalized in August for falls and hallucinations, found to be associated with her dementia.  She was discharged to rehab and now back at [facility].  She was falling frequently prior to her hospitalization, but reports no falls over the past 2 months.  Since being home, she has noticed difficulty with speaking and words being slurred, especially as the day progresses.  She continues to have intermittent diplopia and wears prisms.  Eyelids still can be droopy, but slightly better than before.  Sometimes, her food gets stuck in her throat.  No arms or leg weakness.   She ha spells of visual distortion, described as if a jug-saw puzzle pieces are moving.  This occurs most days of the week.  She also reports having headaches at the left base of her head.   UPDATE 07/31/2022:  She started mestinon '60mg'$  1 tablet at 9am and 2pm.  She has noticed that about 4-5 hours after she takes the tablet, her speech starts to get  slurred and double vision returns.  She has some difficulty swallowing her morning medications.  No problems with eating.  She also reports having spells of leg weakness and had to use a wheelchair at one point after playing bingo.    Medications:  Current Outpatient Medications on File Prior to Visit  Medication Sig Dispense Refill   acetaminophen (TYLENOL) 325 MG tablet Take 2 tablets (650 mg total) by mouth every 6 (six) hours as needed for mild pain (or Fever >/= 101).     benzocaine-menthol (CHLORASEPTIC SORE THROAT) 6-10 MG lozenge Take 1 lozenge by mouth every 2 (two) hours as needed for sore throat.     Cholecalciferol (VITAMIN D-3) 125 MCG (5000 UT) TABS Take 5,000 Units by mouth daily.     cholestyramine (QUESTRAN) 4 g packet Take 4 g by mouth daily.  6   cyanocobalamin 1000 MCG tablet Take 1 tablet (1,000 mcg total) by mouth daily.     feeding supplement (ENSURE ENLIVE / ENSURE PLUS) LIQD Take 237 mLs by mouth daily. 237 mL 12   fenofibrate 160 MG tablet Take 160 mg by mouth daily. Take with food     ferrous sulfate 325 (65 FE) MG tablet Take 325 mg by mouth daily.     fexofenadine-pseudoephedrine (ALLEGRA-D) 60-120 MG 12 hr tablet Take 1 tablet by mouth 2 (two) times daily.     fluticasone (FLONASE) 50 MCG/ACT nasal spray Place 1 spray into the nose 2 (two) times daily.     furosemide (LASIX) 20 MG tablet TAKE 1 TABLET  BY MOUTH 3 TIMES A WEEK. (Patient taking differently: Take 20 mg by mouth every Monday, Wednesday, and Friday.) 45 tablet 3   gabapentin (NEURONTIN) 300 MG capsule Take 300 mg by mouth 3 (three) times daily.     galantamine (RAZADYNE ER) 8 MG 24 hr capsule Take 8 mg by mouth daily.  2   hydrOXYzine (ATARAX/VISTARIL) 25 MG tablet Take 25 mg by mouth at bedtime.     levothyroxine (SYNTHROID, LEVOTHROID) 75 MCG tablet Take 75 mcg by mouth daily.     melatonin 3 MG TABS tablet Take 1 tablet (3 mg total) by mouth at bedtime.  0   metFORMIN (GLUCOPHAGE) 500 MG tablet Take  500 mg by mouth 2 (two) times daily.     methocarbamol (ROBAXIN) 500 MG tablet Take 1 tablet (500 mg total) by mouth every 8 (eight) hours as needed for muscle spasms. 20 tablet 0   metoprolol succinate (TOPROL-XL) 100 MG 24 hr tablet TAKE 1 TABLET(100 MG) BY MOUTH DAILY 90 tablet 3   Multiple Vitamins-Minerals (ADULT GUMMY PO) Take 1 capsule by mouth daily.     niacin (VITAMIN B3) 100 MG tablet Take 1 tablet (100 mg total) by mouth at bedtime.     nortriptyline (PAMELOR) 50 MG capsule Take 2 capsules (100 mg total) by mouth at bedtime.     pantoprazole (PROTONIX) 40 MG tablet Take 40 mg by mouth 2 (two) times daily.     PRESCRIPTION MEDICATION Inhale into the lungs at bedtime. CPAP     pyridostigmine (MESTINON) 60 MG tablet Take 1 tablet at 9am and 1 tablet at 2pm.  Please allow patient to administer. 60 tablet 5   sodium chloride (OCEAN) 0.65 % SOLN nasal spray Place 1 spray into both nostrils every 2 (two) hours as needed for congestion.     spironolactone (ALDACTONE) 25 MG tablet Take 1 tablet (25 mg total) by mouth daily. (Patient taking differently: Take 25 mg by mouth at bedtime.) 30 tablet 11   No current facility-administered medications on file prior to visit.    Allergies:  Allergies  Allergen Reactions   Crestor [Rosuvastatin Calcium] Other (See Comments)    Caused Myalgias   Effexor [Venlafaxine Hydrochloride] Other (See Comments) and Hypertension    Caused BP to go up   Aricept [Donepezil]     Other reaction(s): diarrhea   Latex     Other reaction(s): rash   Nardil [Phenelzine]     Other reaction(s): HTN   Venlafaxine     BP problems Other reaction(s): Unkown   Atorvastatin Rash    Other reaction(s): rash   Clopidogrel Rash   Hibiclens [Chlorhexidine Gluconate] Hives   Lipitor [Atorvastatin Calcium] Rash   Lisinopril Rash    Other reaction(s): rash   Nardil Other (See Comments)    Caused BP Problems   Penicillins Swelling and Other (See Comments)    Tolerated  Cephalosporin Date: 05/01/22     Plavix [Clopidogrel Bisulfate] Rash   Simvastatin Rash    Other reaction(s): rash   Sulfa Antibiotics Rash   Sulfonamide Derivatives Rash   Tape Rash    Other reaction(s): rash    Vital Signs:  BP (!) 150/80   Pulse 84   Ht 5' 1.25" (1.556 m)   Wt 147 lb (66.7 kg)   SpO2 98%   BMI 27.55 kg/m     Neurological Exam: MENTAL STATUS including orientation to time, place, person, recent and remote memory, attention span and concentration, language, and fund of  knowledge is normal.  Speech is mildly dysarthric.  CRANIAL NERVES:  No visual field defects.  Pupils equal round and reactive to light.  Normal conjugate, extra-ocular eye movements in all directions of gaze. Mild left > right ptosis, no worsening with sustained upgaze .  Face is symmetric. Palate elevates symmetrically.  Tongue is midline.  MOTOR:  Motor strength is 5/5 in all extremities, no fatigability.  No atrophy, fasciculations or abnormal movements.  No pronator drift.  Tone is normal.    MSRs:  Reflexes are 2+/4 in the arms and absent in the legs.  SENSORY:  Intact to vibration at the knees, absent below the ankles.  COORDINATION/GAIT:  Normal finger-to- nose-finger.  Gait stable assisted with walker   Data: CT head 05/29/2022:  negative  Lab Results  Component Value Date   TSH 5.134 (H) 05/27/2022    Lab Results  Component Value Date   HGBA1C 6.1 (H) 05/28/2022     Thank you for allowing me to participate in patient's care.  If I can answer any additional questions, I would be pleased to do so.    Sincerely,    Richy Spradley K. Posey Pronto, DO

## 2022-08-01 ENCOUNTER — Ambulatory Visit: Payer: Medicare Other | Admitting: Neurology

## 2022-08-01 ENCOUNTER — Encounter: Payer: Self-pay | Admitting: Neurology

## 2022-08-01 DIAGNOSIS — H02403 Unspecified ptosis of bilateral eyelids: Secondary | ICD-10-CM

## 2022-08-01 DIAGNOSIS — G7000 Myasthenia gravis without (acute) exacerbation: Secondary | ICD-10-CM

## 2022-08-01 DIAGNOSIS — R471 Dysarthria and anarthria: Secondary | ICD-10-CM

## 2022-08-01 DIAGNOSIS — H532 Diplopia: Secondary | ICD-10-CM

## 2022-08-02 DIAGNOSIS — M6281 Muscle weakness (generalized): Secondary | ICD-10-CM | POA: Diagnosis not present

## 2022-08-02 NOTE — Procedures (Signed)
[facility]  [address], [address]  [place], [place] [address] Tel: [phone] Fax: [phone] Test Date:  08/01/2022  Patient: Heidi [name] DOB: 12/12/1940 Physician: Narda Amber, DO  Sex: Female Height: '5\' 1"'$  Ref Phys: Narda Amber, DO  ID#: [id number]    Technician:    History: This is a 81 year old female referred for evaluation of diplopia, dysphagia, and dysarthria.  NCV & EMG Findings: Extensive electrodiagnostic testing of the left upper and lower extremities shows:  Left median sensory response is within normal limits.  Left sural sensory response is absent, and most likely age-related finding. Left median motor response is within normal limits.  Left peroneal motor response shows reduced amplitude at the extensor digitorum brevis and tibialis anterior (L1.5, 2.8 mV). Repetitive nerve stimulation of the spinal accessory nerve recording at the trapezius shows abnormal decrement.  Repetitive nerve stimulation of the median and peroneal nerve recording at the abductor pollicis brevis and tibialis anterior is within normal limits. Chronic motor axonal loss changes are seen in the left rectus femoris and tibialis anterior muscles.  Impression: The electrophysiologic findings are consistent with a postsynaptic neuromuscular junction disorder, i.e. myasthenia gravis, affecting the bulbar region. 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 A large fiber sensorimotor polyneuropathy affecting the left lower extremity cannot be excluded, correlate clinically.    ___________________________ Narda Amber, DO    Nerve Conduction Studies   Stim Site NR Peak (ms) Norm Peak (ms) O-P Amp (V) Norm O-P Amp  Left Median Anti Sensory (2nd Digit)  32 C  Wrist    3.4 <3.8 27.0 >10  Left Sural Anti Sensory (Lat Mall)  32 C  Calf *NR  <4.6  >3     Stim Site NR Onset (ms) Norm Onset (ms) O-P Amp (mV) Norm O-P Amp Site1 Site2 Delta-0 (ms) Dist  (cm) Vel (m/s) Norm Vel (m/s)  Left Median Motor (Abd Poll Brev)  32 C  Wrist    3.2 <4.0 8.3 >5 Elbow Wrist 4.8 27.0 56 >50  Elbow    8.0  7.7         Left Peroneal Motor (Ext Dig Brev)  32 C  Ankle    5.3 <6.0 *1.5 >2.5 B Fib Ankle 6.3 31.0 49 >40  B Fib    11.6  1.5  Poplt B Fib 1.5 7.0 47 >40  Poplt    13.1  1.5         Left Peroneal TA Motor (Tib Ant)  32 C  Fib Head    2.9 <4.5 *2.8 >3 Poplit Fib Head 0.7 7.0 42 >40  Poplit    3.6 <5.7 2.7          Electromyography   Side Muscle Ins.Act Fibs Fasc Recrt Amp Dur Poly Activation Comment  Left AntTibialis Nml Nml Nml *1- *1+ *1+ *1+ Nml N/A  Left Gastroc Nml Nml Nml Nml Nml Nml Nml Nml N/A  Left RectFemoris Nml Nml Nml *1- *1+ *1+ *1+ Nml N/A  Left GluteusMed Nml Nml Nml Nml Nml Nml Nml Nml N/A  Left 1stDorInt Nml Nml Nml Nml Nml Nml Nml Nml N/A  Left PronatorTeres Nml Nml Nml Nml Nml Nml Nml Nml N/A  Left Biceps Nml Nml Nml Nml Nml Nml Nml Nml N/A  Left Triceps Nml Nml Nml Nml Nml Nml Nml Nml N/A  Left Deltoid Nml Nml Nml Nml Nml Nml Nml Nml N/A    Left Abductor Pollicis Brevis   Trial #  Label Amp 1 (mV)  O-P Amp 5 (mV)  O-P Amp % Dif Area 1 (mVms) Area 5 (mVms) Area % Dif Rep Rate Train Length Pause Time (min:sec) Comments  Tr 1 Baseline 8.55 8.48 -0.7 23.61 22.73 -3.7 3.00 10 00:30   Tr 2 Post Exercise 9.11 9.17 0.7 22.82 21.97 -3.7 3.00 10 01:00   Tr 3 1 Min Post 9.66 9.46 -2.1 22.63 22.22 -1.8 3.00 10 01:00   Tr 4 2 Min Post 9.67 9.65 -0.3 22.04 21.86 -0.8 3.00 10 01:00   Tr 5 3 Min Post 9.48 9.57 0.9 21.90 21.91 0.1 3.00 10 00:00   6        3.00 10 00:00     Left Trapezius   Trial # Label Amp 1 (mV)  O-P Amp 5 (mV)  O-P Amp % Dif Area 1 (mVms) Area 5 (mVms) Area % Dif Rep Rate Train Length Pause Time (min:sec) Comments  Tr 1 Baseline 2.45 2.35 -4.2 13.24 11.73 -11.4 3.00 10 00:30   Tr 2 Post Exercise 2.22 2.00 -9.9 10.12 8.52 -15.9 3.00 10 01:00   Tr 3 1 Min Post 1.99 1.62 -18.7 8.54 6.54 -23.4 3.00 10 01:00    Tr 4 2 Min Post 2.01 1.99 -1.3 8.76 8.28 -5.5 3.00 10 01:00   Tr 5 3 Min Post 2.15 1.96 -9.0 9.27 7.70 -17.0 3.00 10 00:00   6        3.00 10 00:00    Left Tibialis Anterior   Trial # Label Amp 1 (mV)  O-P Amp 5 (mV)  O-P Amp % Dif Area 1 (mVms) Area 5 (mVms) Area % Dif Rep Rate Train Length Pause Time (min:sec) Comments  Tr 1 Baseline 2.74 2.69 -1.8 19.79 19.16 -3.2 3.00 10 00:30   Tr 2 Post Exercise 2.66 2.66 -0.4 19.81 19.50 -1.5 3.00 10 01:00   Tr 3 1 Min Post 2.38 2.28 -4.3 18.27 17.21 -5.8 3.00 10 01:00   Tr 4 2 Min Post 2.68 2.60 -2.8 21.25 20.39 -4.0 3.00 10 01:00   Tr 5 3 Min Post 2.71 2.57 -5.1 20.52 18.80 -8.4 3.00 10 00:00   6        3.00 10 00:00      Waveforms:

## 2022-08-05 ENCOUNTER — Telehealth: Payer: Self-pay | Admitting: Neurology

## 2022-08-05 DIAGNOSIS — R296 Repeated falls: Secondary | ICD-10-CM | POA: Diagnosis not present

## 2022-08-05 MED ORDER — PREDNISONE 10 MG PO TABS: 10.0000 mg | ORAL_TABLET | Freq: Every day | ORAL | 3 refills | Status: DC

## 2022-08-05 NOTE — Telephone Encounter (Signed)
Called and discussed the results of EMG which is consistent with myasthenia gravis.    Plan:  - Start prednisone '10mg'$  daily  - Continue mestinon '60mg'$  three times daily  - Follow-up in 3 weeks.

## 2022-08-05 NOTE — Telephone Encounter (Signed)
Patient called and said she is returning a call from Dr. Posey Pronto  directly for EMG results.

## 2022-08-06 DIAGNOSIS — M6281 Muscle weakness (generalized): Secondary | ICD-10-CM | POA: Diagnosis not present

## 2022-08-07 DIAGNOSIS — G3184 Mild cognitive impairment, so stated: Secondary | ICD-10-CM | POA: Diagnosis not present

## 2022-08-07 LAB — MUSK ANTIBODIES

## 2022-08-08 DIAGNOSIS — M6281 Muscle weakness (generalized): Secondary | ICD-10-CM | POA: Diagnosis not present

## 2022-08-09 ENCOUNTER — Ambulatory Visit
Admission: RE | Admit: 2022-08-09 | Discharge: 2022-08-09 | Disposition: A | Discharge status: Home / Self Care | Payer: Medicare Other | Source: Ambulatory Visit | Attending: Internal Medicine | Admitting: Internal Medicine

## 2022-08-09 DIAGNOSIS — M8589 Other specified disorders of bone density and structure, multiple sites: Secondary | ICD-10-CM | POA: Diagnosis not present

## 2022-08-09 DIAGNOSIS — M8580 Other specified disorders of bone density and structure, unspecified site: Secondary | ICD-10-CM

## 2022-08-09 DIAGNOSIS — Z780 Asymptomatic menopausal state: Secondary | ICD-10-CM | POA: Diagnosis not present

## 2022-08-09 DIAGNOSIS — R296 Repeated falls: Secondary | ICD-10-CM | POA: Diagnosis not present

## 2022-08-13 DIAGNOSIS — M6281 Muscle weakness (generalized): Secondary | ICD-10-CM | POA: Diagnosis not present

## 2022-08-14 DIAGNOSIS — R296 Repeated falls: Secondary | ICD-10-CM | POA: Diagnosis not present

## 2022-08-15 DIAGNOSIS — M6281 Muscle weakness (generalized): Secondary | ICD-10-CM | POA: Diagnosis not present

## 2022-08-16 DIAGNOSIS — R296 Repeated falls: Secondary | ICD-10-CM | POA: Diagnosis not present

## 2022-08-20 DIAGNOSIS — M6281 Muscle weakness (generalized): Secondary | ICD-10-CM | POA: Diagnosis not present

## 2022-08-21 DIAGNOSIS — R296 Repeated falls: Secondary | ICD-10-CM | POA: Diagnosis not present

## 2022-08-22 DIAGNOSIS — M6281 Muscle weakness (generalized): Secondary | ICD-10-CM | POA: Diagnosis not present

## 2022-08-23 DIAGNOSIS — R296 Repeated falls: Secondary | ICD-10-CM | POA: Diagnosis not present

## 2022-08-27 NOTE — Progress Notes (Signed)
Follow-up Visit   Date: 08/28/2022    Heidi [name] MRN: [id number] DOB: May 04, 1941    Heidi [name] is a 81 y.o. 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  The patient was accompanied to the clinic by daughter who also provides collateral information.     IMPRESSION/PLAN: Seronegative myasthenia gravis manifesting with dysarthria, diplopia, ptosis, and dysphagia. AChR and MUSK antibody negative.  Clinically, she has made significant improvement after starting prednisone. Today, she has minimal evidence of disease manifestation, however, she continues to have intermittent breakthrough diplopia and dysarthria.   - Increase prednisone to 15 mg daily  - Adjust mestinon to 30-60mg  three times daily (8a, 1p, and 5pm)  - Check CT chest w contrast  - List provided of medications to avoid/use with caution  Return to clinic in 2 months  --------------------------------------------- UPDATE 07/17/2022:  She was hospitalized in August for falls and hallucinations, found to be associated with her dementia.  She was discharged to rehab and now back at [facility].  She was falling frequently prior to her hospitalization, but reports no falls over the past 2 months.  Since being home, she has noticed difficulty with speaking and words being slurred, especially as the day progresses.  She continues to have intermittent diplopia and wears prisms.  Eyelids still can be droopy, but slightly better than before.  Sometimes, her food gets stuck in her throat.  No arms or leg weakness.   She ha spells of visual distortion, described as if a jug-saw puzzle pieces are moving.  This occurs most days of the week.  She also reports having headaches at the left base of her head.   UPDATE 07/31/2022:  She started mestinon 60mg  1 tablet at 9am and  2pm.  She has noticed that about 4-5 hours after she takes the tablet, her speech starts to get slurred and double vision returns.  She has some difficulty swallowing her morning medications.  No problems with eating.  She also reports having spells of leg weakness and had to use a wheelchair at one point after playing bingo.   UPDATE 08/28/2022:  She is here for follow-up visit.  In October, she had EMG was consistent with myasthenia gravis and was started on prednisone 10mg  daily.  She noticed marked improvement in leg strength, swallowing, speech, and double vision.  She continues to have mild intermittent double vision and difficulty with getting words out.  No slurred speech.  She has been taking mestinon 60mg  three times daily, but reports having diarrhea with it.  It does help, because by around 5-6pm, she is feeling generalized weakness again.    Medications:  Current Outpatient Medications on File Prior to Visit  Medication Sig Dispense Refill   acetaminophen (TYLENOL) 325 MG tablet Take 2 tablets (650 mg total) by mouth every 6 (six) hours as needed for mild pain (or Fever >/= 101).     Cholecalciferol (VITAMIN D-3) 125 MCG (5000 UT) TABS Take 5,000 Units by mouth daily.     cholestyramine (QUESTRAN) 4 g packet Take 4 g by mouth daily.  6   cyanocobalamin 1000 MCG tablet Take 1 tablet (1,000 mcg total) by mouth daily.     feeding supplement (ENSURE ENLIVE / ENSURE PLUS) LIQD Take 237 mLs by mouth daily. 237 mL 12   fenofibrate 160 MG tablet Take 160 mg by mouth daily.  Take with food     ferrous sulfate 325 (65 FE) MG tablet Take 325 mg by mouth daily.     fexofenadine-pseudoephedrine (ALLEGRA-D) 60-120 MG 12 hr tablet Take 1 tablet by mouth 2 (two) times daily.     fluticasone (FLONASE) 50 MCG/ACT nasal spray Place 1 spray into the nose 2 (two) times daily.     furosemide (LASIX) 20 MG tablet TAKE 1 TABLET BY MOUTH 3 TIMES A WEEK. (Patient taking differently: Take 20 mg by mouth every  Monday, Wednesday, and Friday.) 45 tablet 3   gabapentin (NEURONTIN) 300 MG capsule Take 300 mg by mouth 3 (three) times daily.     galantamine (RAZADYNE ER) 8 MG 24 hr capsule Take 8 mg by mouth daily.  2   hydrOXYzine (ATARAX/VISTARIL) 25 MG tablet Take 25 mg by mouth at bedtime.     levothyroxine (SYNTHROID, LEVOTHROID) 75 MCG tablet Take 75 mcg by mouth daily.     melatonin 3 MG TABS tablet Take 1 tablet (3 mg total) by mouth at bedtime.  0   metFORMIN (GLUCOPHAGE) 500 MG tablet Take 500 mg by mouth 2 (two) times daily.     methocarbamol (ROBAXIN) 500 MG tablet Take 1 tablet (500 mg total) by mouth every 8 (eight) hours as needed for muscle spasms. 20 tablet 0   metoprolol succinate (TOPROL-XL) 100 MG 24 hr tablet TAKE 1 TABLET(100 MG) BY MOUTH DAILY 90 tablet 3   Multiple Vitamins-Minerals (ADULT GUMMY PO) Take 1 capsule by mouth daily.     niacin (VITAMIN B3) 100 MG tablet Take 1 tablet (100 mg total) by mouth at bedtime.     nortriptyline (PAMELOR) 50 MG capsule Take 2 capsules (100 mg total) by mouth at bedtime.     pantoprazole (PROTONIX) 40 MG tablet Take 40 mg by mouth 2 (two) times daily.     PRESCRIPTION MEDICATION Inhale into the lungs at bedtime. CPAP     pyridostigmine (MESTINON) 60 MG tablet Take 1 tablet at 8am, noon, and 4pm.  Please allow patient to administer. 90 tablet 5   sodium chloride (OCEAN) 0.65 % SOLN nasal spray Place 1 spray into both nostrils every 2 (two) hours as needed for congestion.     spironolactone (ALDACTONE) 25 MG tablet Take 1 tablet (25 mg total) by mouth daily. (Patient taking differently: Take 25 mg by mouth at bedtime.) 30 tablet 11   No current facility-administered medications on file prior to visit.    Allergies:  Allergies  Allergen Reactions   Crestor [Rosuvastatin Calcium] Other (See Comments)    Caused Myalgias   Effexor [Venlafaxine Hydrochloride] Other (See Comments) and Hypertension    Caused BP to go up   Aricept [Donepezil]      Other reaction(s): diarrhea   Latex     Other reaction(s): rash   Nardil [Phenelzine]     Other reaction(s): HTN   Venlafaxine     BP problems Other reaction(s): Unkown   Atorvastatin Rash    Other reaction(s): rash   Clopidogrel Rash   Hibiclens [Chlorhexidine Gluconate] Hives   Lipitor [Atorvastatin Calcium] Rash   Lisinopril Rash    Other reaction(s): rash   Nardil Other (See Comments)    Caused BP Problems   Penicillins Swelling and Other (See Comments)    Tolerated Cephalosporin Date: 05/01/22     Plavix [Clopidogrel Bisulfate] Rash   Simvastatin Rash    Other reaction(s): rash   Sulfa Antibiotics Rash   Sulfonamide Derivatives Rash  Tape Rash    Other reaction(s): rash    Vital Signs:  BP (!) 159/83   Pulse 81   Ht 5' 1.25" (1.556 m)   Wt 149 lb (67.6 kg)   SpO2 97%   BMI 27.92 kg/m     Neurological Exam: MENTAL STATUS including orientation to time, place, person, recent and remote memory, attention span and concentration, language, and fund of knowledge is normal.  Speech is mildly dysarthric.  CRANIAL NERVES:  No visual field defects.  Pupils equal round and reactive to light.  Normal conjugate, extra-ocular eye movements in all directions of gaze. No ptosis at rest or with sustained upgaze (improved) .  Face is symmetric. Palate elevates symmetrically.  Tongue is midline.  MOTOR:  Motor strength is 5/5 in all extremities, no fatigability.  No atrophy, fasciculations or abnormal movements.  No pronator drift.  Tone is normal.    MSRs:  Reflexes are 2+/4 in the arms and absent in the legs.  SENSORY:  Intact to vibration at the knees, absent below the ankles.  COORDINATION/GAIT:  Normal finger-to- nose-finger.  Gait stable assisted with walker.  She is able to stand up without using arms to push off.   Data: CT head 05/29/2022:  negative  Lab Results  Component Value Date   TSH 5.134 (H) 05/27/2022    Lab Results  Component Value Date   HGBA1C 6.1  (H) 05/28/2022     Thank you for allowing me to participate in patient's care.  If I can answer any additional questions, I would be pleased to do so.    Sincerely,    Thomasine Klutts K. Allena Katz, DO

## 2022-08-28 ENCOUNTER — Encounter: Payer: Self-pay | Admitting: Neurology

## 2022-08-28 ENCOUNTER — Ambulatory Visit: Payer: Medicare Other | Admitting: Neurology

## 2022-08-28 ENCOUNTER — Telehealth: Payer: Self-pay

## 2022-08-28 VITALS — BP 159/83 | HR 81 | Ht 61.25 in | Wt 149.0 lb

## 2022-08-28 DIAGNOSIS — R059 Cough, unspecified: Secondary | ICD-10-CM | POA: Diagnosis not present

## 2022-08-28 DIAGNOSIS — G7000 Myasthenia gravis without (acute) exacerbation: Secondary | ICD-10-CM | POA: Diagnosis not present

## 2022-08-28 DIAGNOSIS — R296 Repeated falls: Secondary | ICD-10-CM | POA: Diagnosis not present

## 2022-08-28 MED ORDER — PREDNISONE 10 MG PO TABS: 15.0000 mg | ORAL_TABLET | Freq: Every day | ORAL | 3 refills | Status: DC

## 2022-08-28 NOTE — Telephone Encounter (Signed)
Called [employer] [facility] and no PA is needed.

## 2022-08-28 NOTE — Patient Instructions (Addendum)
CT chest with contrast Increase prednisone to '15mg'$  daily Adjust mestinon to 30-'60mg'$  three times daily (8a, 1p, and 5pm)  Return to clinic in January

## 2022-09-02 DIAGNOSIS — R296 Repeated falls: Secondary | ICD-10-CM | POA: Diagnosis not present

## 2022-09-03 DIAGNOSIS — M6281 Muscle weakness (generalized): Secondary | ICD-10-CM | POA: Diagnosis not present

## 2022-09-04 ENCOUNTER — Ambulatory Visit (INDEPENDENT_AMBULATORY_CARE_PROVIDER_SITE_OTHER): Payer: Medicare Other

## 2022-09-04 DIAGNOSIS — I495 Sick sinus syndrome: Secondary | ICD-10-CM | POA: Diagnosis not present

## 2022-09-04 LAB — CUP PACEART REMOTE DEVICE CHECK
Battery Impedance: 1820 Ohm
Battery Remaining Longevity: 44 mo
Battery Voltage: 2.76 V
Brady Statistic AP VP Percent: 1 %
Brady Statistic AP VS Percent: 9 %
Brady Statistic AS VP Percent: 0 %
Brady Statistic AS VS Percent: 90 %
Date Time Interrogation Session: 20231129100458
Implantable Lead Connection Status: 753985
Implantable Lead Connection Status: 753985
Implantable Lead Implant Date: 20070710
Implantable Lead Implant Date: 20070710
Implantable Lead Location: 753859
Implantable Lead Location: 753860
Implantable Lead Model: 4469
Implantable Lead Model: 4470
Implantable Lead Serial Number: 483166
Implantable Lead Serial Number: 541525
Implantable Pulse Generator Implant Date: 20130410
Lead Channel Impedance Value: 633 Ohm
Lead Channel Impedance Value: 684 Ohm
Lead Channel Pacing Threshold Amplitude: 0.625 V
Lead Channel Pacing Threshold Amplitude: 0.625 V
Lead Channel Pacing Threshold Pulse Width: 0.4 ms
Lead Channel Pacing Threshold Pulse Width: 0.4 ms
Lead Channel Setting Pacing Amplitude: 2 V
Lead Channel Setting Pacing Amplitude: 2.5 V
Lead Channel Setting Pacing Pulse Width: 0.4 ms
Lead Channel Setting Sensing Sensitivity: 5.6 mV
Zone Setting Status: 755011
Zone Setting Status: 755011

## 2022-09-05 DIAGNOSIS — M6281 Muscle weakness (generalized): Secondary | ICD-10-CM | POA: Diagnosis not present

## 2022-09-09 DIAGNOSIS — R296 Repeated falls: Secondary | ICD-10-CM | POA: Diagnosis not present

## 2022-09-10 DIAGNOSIS — M6281 Muscle weakness (generalized): Secondary | ICD-10-CM | POA: Diagnosis not present

## 2022-09-11 ENCOUNTER — Ambulatory Visit (HOSPITAL_COMMUNITY)
Admission: RE | Admit: 2022-09-11 | Discharge: 2022-09-11 | Disposition: A | Discharge status: Home / Self Care | Payer: Medicare Other | Source: Ambulatory Visit | Attending: Neurology | Admitting: Neurology

## 2022-09-11 DIAGNOSIS — G7000 Myasthenia gravis without (acute) exacerbation: Secondary | ICD-10-CM | POA: Diagnosis not present

## 2022-09-11 DIAGNOSIS — R059 Cough, unspecified: Secondary | ICD-10-CM | POA: Diagnosis not present

## 2022-09-11 DIAGNOSIS — I700 Atherosclerosis of aorta: Secondary | ICD-10-CM | POA: Diagnosis not present

## 2022-09-11 LAB — POCT I-STAT CREATININE: Creatinine, Ser: 0.6 mg/dL (ref 0.44–1.00)

## 2022-09-11 LAB — POCT ACTIVATED CLOTTING TIME: Activated Clotting Time: 120 seconds

## 2022-09-11 MED ORDER — IOHEXOL 350 MG/ML SOLN
60.0000 mL | Freq: Once | INTRAVENOUS | Status: AC | PRN
Start: 2022-09-11 — End: 2022-09-11
  Administered 2022-09-11: 60 mL via INTRAVENOUS

## 2022-09-18 ENCOUNTER — Telehealth: Payer: Self-pay | Admitting: Neurology

## 2022-09-18 NOTE — Telephone Encounter (Signed)
Patient left VM stating she is returning a call to someone about the results of the scan

## 2022-09-18 NOTE — Telephone Encounter (Signed)
I advised to patient of results

## 2022-10-02 NOTE — Progress Notes (Signed)
Remote pacemaker transmission.   

## 2022-10-14 ENCOUNTER — Telehealth: Payer: Self-pay | Admitting: Anesthesiology

## 2022-10-14 NOTE — Telephone Encounter (Signed)
Pt called stating she believes the medication Mestinon is causing her to have diarrhea and weakness. Pt requests call back.

## 2022-10-15 ENCOUNTER — Other Ambulatory Visit: Payer: Self-pay | Admitting: Neurology

## 2022-10-15 NOTE — Telephone Encounter (Signed)
Called and informed Pt of answer Per Dr. Posey Pronto . She understood

## 2022-10-15 NOTE — Telephone Encounter (Signed)
Recommend she trying lower the dose to half tablet twice daily at 9am and 3pm and see if that helps.  Please have her call with update in 1 week.  Thanks.

## 2022-10-21 ENCOUNTER — Ambulatory Visit: Payer: Medicare Other | Admitting: Neurology

## 2022-10-30 ENCOUNTER — Ambulatory Visit (INDEPENDENT_AMBULATORY_CARE_PROVIDER_SITE_OTHER): Payer: HMO | Admitting: Neurology

## 2022-10-30 ENCOUNTER — Encounter: Payer: Self-pay | Admitting: Neurology

## 2022-10-30 VITALS — BP 178/91 | HR 92 | Ht 61.25 in | Wt 154.0 lb

## 2022-10-30 DIAGNOSIS — G7001 Myasthenia gravis with (acute) exacerbation: Secondary | ICD-10-CM

## 2022-10-30 MED ORDER — PREDNISONE 20 MG PO TABS: 20.0000 mg | ORAL_TABLET | Freq: Every day | ORAL | 3 refills | Status: DC

## 2022-10-30 MED ORDER — PYRIDOSTIGMINE BROMIDE 60 MG PO TABS: ORAL_TABLET | ORAL | 5 refills | Status: AC

## 2022-10-30 NOTE — Patient Instructions (Addendum)
Increase prednisone to '20mg'$  daily  Reduce mestinon to '30mg'$  10am and 4pm  Return to clinic in 2 months

## 2022-10-30 NOTE — Progress Notes (Signed)
Follow-up Visit   Date: 10/30/2022    Heidi [name] MRN: [id number] DOB: [birth year]    Heidi [name] is a 82 y.o. 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  The patient was accompanied to the clinic by daughter who also provides collateral information.     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  Symptoms initially presented with dysarthria, diplopia, ptosis, and dysphagia. AChR and MUSK antibody negative, thymoma negative.  Despite her exam showing minimal evidence of disease manifestation, she reports symptoms of dysphagia, diplopia, and weakness.  She reports noticing benefit with mestinon but has been unable to tolerate it due to GI side effects.   - Increase prednisone to '20mg'$  daily  - Reduce mestinon to '30mg'$  twice daily at 10a and 4p due to GI side effects  Return to clinic in 2 months  --------------------------------------------- UPDATE 07/17/2022:  She was hospitalized in August for falls and hallucinations, found to be associated with her dementia.  She was discharged to rehab and now back at [facility].  She was falling frequently prior to her hospitalization, but reports no falls over the past 2 months.  Since being home, she has noticed difficulty with speaking and words being slurred, especially as the day progresses.  She continues to have intermittent diplopia and wears prisms.  Eyelids still can be droopy, but slightly better than before.  Sometimes, her food gets stuck in her throat.  No arms or leg weakness.   She ha spells of visual distortion, described as if a jug-saw puzzle pieces are moving.  This occurs most days of the week.  She also reports having headaches at the left base of her head.   UPDATE 07/31/2022:  She started mestinon '60mg'$  1 tablet at  9am and 2pm.  She has noticed that about 4-5 hours after she takes the tablet, her speech starts to get slurred and double vision returns.  She has some difficulty swallowing her morning medications.  No problems with eating.  She also reports having spells of leg weakness and had to use a wheelchair at one point after playing bingo.   UPDATE 08/28/2022:    In October, she had EMG was consistent with myasthenia gravis and was started on prednisone '10mg'$  daily.  She noticed marked improvement in leg strength, swallowing, speech, and double vision.  She continues to have mild intermittent double vision and difficulty with getting words out.  No slurred speech.  She has been taking mestinon '60mg'$  three times daily, but reports having diarrhea with it.  It does help, because by around 5-6pm, she is feeling generalized weakness again.   UPDATE 10/30/2022:  She is here for follow-up visit.  She reports no significant change in her double vision or difficulty talking since her last visit, despite increasing prednisone to '15mg'$  daily.  She also feels that her legs are weak and has started PT for balance.    Due to GI upset, she reduced mestinon to '30mg'$  at 9am, '60mg'$  at 2pm, and '30mg'$  at 5pm, which has helped diarrhea, but she continues to have cramps.  Overall, she is not feeling well.    Medications:  Current Outpatient Medications on File Prior to Visit  Medication Sig Dispense Refill   acetaminophen (TYLENOL) 325 MG tablet Take 2 tablets (650 mg total) by mouth every 6 (six) hours as needed for mild pain (or  Fever >/= 101).     Cholecalciferol (VITAMIN D-3) 125 MCG (5000 UT) TABS Take 5,000 Units by mouth daily.     cholestyramine (QUESTRAN) 4 g packet Take 4 g by mouth daily.  6   cyanocobalamin 1000 MCG tablet Take 1 tablet (1,000 mcg total) by mouth daily.     feeding supplement (ENSURE ENLIVE / ENSURE PLUS) LIQD Take 237 mLs by mouth daily. 237 mL 12   fenofibrate 160 MG tablet Take 160 mg by mouth daily.  Take with food     ferrous sulfate 325 (65 FE) MG tablet Take 325 mg by mouth daily.     fexofenadine-pseudoephedrine (ALLEGRA-D) 60-120 MG 12 hr tablet Take 1 tablet by mouth 2 (two) times daily.     fluticasone (FLONASE) 50 MCG/ACT nasal spray Place 1 spray into the nose 2 (two) times daily.     furosemide (LASIX) 20 MG tablet TAKE 1 TABLET BY MOUTH 3 TIMES A WEEK. (Patient taking differently: Take 20 mg by mouth every Monday, Wednesday, and Friday.) 45 tablet 3   gabapentin (NEURONTIN) 300 MG capsule Take 300 mg by mouth 3 (three) times daily.     galantamine (RAZADYNE ER) 8 MG 24 hr capsule Take 8 mg by mouth daily.  2   hydrOXYzine (ATARAX/VISTARIL) 25 MG tablet Take 25 mg by mouth at bedtime.     levothyroxine (SYNTHROID, LEVOTHROID) 75 MCG tablet Take 75 mcg by mouth daily.     melatonin 3 MG TABS tablet Take 1 tablet (3 mg total) by mouth at bedtime.  0   metFORMIN (GLUCOPHAGE) 500 MG tablet Take 500 mg by mouth 2 (two) times daily.     methocarbamol (ROBAXIN) 500 MG tablet Take 1 tablet (500 mg total) by mouth every 8 (eight) hours as needed for muscle spasms. 20 tablet 0   metoprolol succinate (TOPROL-XL) 100 MG 24 hr tablet TAKE 1 TABLET(100 MG) BY MOUTH DAILY 90 tablet 3   Multiple Vitamins-Minerals (ADULT GUMMY PO) Take 1 capsule by mouth daily.     niacin (VITAMIN B3) 100 MG tablet Take 1 tablet (100 mg total) by mouth at bedtime.     nortriptyline (PAMELOR) 50 MG capsule Take 2 capsules (100 mg total) by mouth at bedtime.     pantoprazole (PROTONIX) 40 MG tablet Take 40 mg by mouth 2 (two) times daily.     predniSONE (DELTASONE) 10 MG tablet Take 1.5 tablets (15 mg total) by mouth daily with breakfast. 45 tablet 3   PRESCRIPTION MEDICATION Inhale into the lungs at bedtime. CPAP     pyridostigmine (MESTINON) 60 MG tablet Take 1 tablet at 8am, noon, and 4pm.  Please allow patient to administer. 90 tablet 5   sodium chloride (OCEAN) 0.65 % SOLN nasal spray Place 1 spray into both  nostrils every 2 (two) hours as needed for congestion.     spironolactone (ALDACTONE) 25 MG tablet Take 1 tablet (25 mg total) by mouth daily. (Patient taking differently: Take 25 mg by mouth at bedtime.) 30 tablet 11   No current facility-administered medications on file prior to visit.    Allergies:  Allergies  Allergen Reactions   Crestor [Rosuvastatin Calcium] Other (See Comments)    Caused Myalgias   Effexor [Venlafaxine Hydrochloride] Other (See Comments) and Hypertension    Caused BP to go up   Aricept [Donepezil]     Other reaction(s): diarrhea   Latex     Other reaction(s): rash   Nardil [Phenelzine]     Other  reaction(s): HTN   Venlafaxine     BP problems Other reaction(s): Unkown   Atorvastatin Rash    Other reaction(s): rash   Clopidogrel Rash   Hibiclens [Chlorhexidine Gluconate] Hives   Lipitor [Atorvastatin Calcium] Rash   Lisinopril Rash    Other reaction(s): rash   Nardil Other (See Comments)    Caused BP Problems   Penicillins Swelling and Other (See Comments)    Tolerated Cephalosporin Date: 05/01/22     Plavix [Clopidogrel Bisulfate] Rash   Simvastatin Rash    Other reaction(s): rash   Sulfa Antibiotics Rash   Sulfonamide Derivatives Rash   Tape Rash    Other reaction(s): rash    Vital Signs:  BP (!) 178/91   Pulse 92   Ht 5' 1.25" (1.556 m)   Wt 154 lb (69.9 kg)   SpO2 100%   BMI 28.86 kg/m     Neurological Exam: MENTAL STATUS including orientation to time, place, person, recent and remote memory, attention span and concentration, language, and fund of knowledge is normal.  Speech is mildly dysarthric.  CRANIAL NERVES:  No visual field defects.  Pupils equal round and reactive to light.  Normal conjugate, extra-ocular eye movements in all directions of gaze. No ptosis at rest or with sustained upgaze .  Face is symmetric. Palate elevates symmetrically.  Tongue is midline.  MOTOR:  Motor strength is 5/5 in all extremities, no  fatigability.  No atrophy, fasciculations or abnormal movements.  No pronator drift.  Tone is normal.    MSRs:  Reflexes are 2+/4 in the arms and absent in the legs.  SENSORY:  Intact to vibration at the knees, absent below the ankles.  COORDINATION/GAIT:  Normal finger-to- nose-finger.  Gait stable assisted with walker.   Data: CT head 05/29/2022:  negative  Lab Results  Component Value Date   TSH 5.134 (H) 05/27/2022    Lab Results  Component Value Date   HGBA1C 6.1 (H) 05/28/2022     Thank you for allowing me to participate in patient's care.  If I can answer any additional questions, I would be pleased to do so.    Sincerely,    Livvy Spilman K. Posey Pronto, DO

## 2022-11-11 ENCOUNTER — Emergency Department (HOSPITAL_COMMUNITY): Payer: HMO

## 2022-11-11 ENCOUNTER — Inpatient Hospital Stay (HOSPITAL_COMMUNITY)
Admission: EM | Admit: 2022-11-11 | Discharge: 2022-11-16 | DRG: 057 | Disposition: A | Discharge status: Skilled Nursing Facility | Payer: HMO | Attending: Internal Medicine | Admitting: Internal Medicine

## 2022-11-11 ENCOUNTER — Encounter (HOSPITAL_COMMUNITY): Payer: Self-pay

## 2022-11-11 ENCOUNTER — Telehealth: Payer: Self-pay

## 2022-11-11 ENCOUNTER — Other Ambulatory Visit: Payer: Self-pay

## 2022-11-11 DIAGNOSIS — K219 Gastro-esophageal reflux disease without esophagitis: Secondary | ICD-10-CM | POA: Diagnosis present

## 2022-11-11 DIAGNOSIS — N183 Chronic kidney disease, stage 3 unspecified: Secondary | ICD-10-CM | POA: Diagnosis present

## 2022-11-11 DIAGNOSIS — R29898 Other symptoms and signs involving the musculoskeletal system: Principal | ICD-10-CM

## 2022-11-11 DIAGNOSIS — I4891 Unspecified atrial fibrillation: Secondary | ICD-10-CM | POA: Diagnosis present

## 2022-11-11 DIAGNOSIS — Z79890 Hormone replacement therapy (postmenopausal): Secondary | ICD-10-CM

## 2022-11-11 DIAGNOSIS — I10 Essential (primary) hypertension: Secondary | ICD-10-CM | POA: Diagnosis present

## 2022-11-11 DIAGNOSIS — I2510 Atherosclerotic heart disease of native coronary artery without angina pectoris: Secondary | ICD-10-CM | POA: Diagnosis present

## 2022-11-11 DIAGNOSIS — M545 Low back pain, unspecified: Secondary | ICD-10-CM

## 2022-11-11 DIAGNOSIS — Z96611 Presence of right artificial shoulder joint: Secondary | ICD-10-CM | POA: Diagnosis present

## 2022-11-11 DIAGNOSIS — Z880 Allergy status to penicillin: Secondary | ICD-10-CM

## 2022-11-11 DIAGNOSIS — Z800 Family history of malignant neoplasm of digestive organs: Secondary | ICD-10-CM

## 2022-11-11 DIAGNOSIS — E1140 Type 2 diabetes mellitus with diabetic neuropathy, unspecified: Secondary | ICD-10-CM | POA: Diagnosis present

## 2022-11-11 DIAGNOSIS — F329 Major depressive disorder, single episode, unspecified: Secondary | ICD-10-CM | POA: Diagnosis present

## 2022-11-11 DIAGNOSIS — Z1152 Encounter for screening for COVID-19: Secondary | ICD-10-CM

## 2022-11-11 DIAGNOSIS — G7001 Myasthenia gravis with (acute) exacerbation: Secondary | ICD-10-CM | POA: Diagnosis not present

## 2022-11-11 DIAGNOSIS — I509 Heart failure, unspecified: Secondary | ICD-10-CM | POA: Diagnosis present

## 2022-11-11 DIAGNOSIS — R296 Repeated falls: Secondary | ICD-10-CM

## 2022-11-11 DIAGNOSIS — I130 Hypertensive heart and chronic kidney disease with heart failure and stage 1 through stage 4 chronic kidney disease, or unspecified chronic kidney disease: Secondary | ICD-10-CM | POA: Diagnosis present

## 2022-11-11 DIAGNOSIS — E039 Hypothyroidism, unspecified: Secondary | ICD-10-CM | POA: Diagnosis present

## 2022-11-11 DIAGNOSIS — Z882 Allergy status to sulfonamides status: Secondary | ICD-10-CM

## 2022-11-11 DIAGNOSIS — E1142 Type 2 diabetes mellitus with diabetic polyneuropathy: Secondary | ICD-10-CM | POA: Diagnosis present

## 2022-11-11 DIAGNOSIS — Z7952 Long term (current) use of systemic steroids: Secondary | ICD-10-CM

## 2022-11-11 DIAGNOSIS — E1122 Type 2 diabetes mellitus with diabetic chronic kidney disease: Secondary | ICD-10-CM | POA: Diagnosis present

## 2022-11-11 DIAGNOSIS — Z7984 Long term (current) use of oral hypoglycemic drugs: Secondary | ICD-10-CM

## 2022-11-11 DIAGNOSIS — E1129 Type 2 diabetes mellitus with other diabetic kidney complication: Secondary | ICD-10-CM | POA: Diagnosis present

## 2022-11-11 DIAGNOSIS — E785 Hyperlipidemia, unspecified: Secondary | ICD-10-CM | POA: Diagnosis present

## 2022-11-11 DIAGNOSIS — Z950 Presence of cardiac pacemaker: Secondary | ICD-10-CM

## 2022-11-11 DIAGNOSIS — I480 Paroxysmal atrial fibrillation: Secondary | ICD-10-CM | POA: Diagnosis present

## 2022-11-11 DIAGNOSIS — G7000 Myasthenia gravis without (acute) exacerbation: Secondary | ICD-10-CM | POA: Diagnosis present

## 2022-11-11 DIAGNOSIS — Z87891 Personal history of nicotine dependence: Secondary | ICD-10-CM

## 2022-11-11 DIAGNOSIS — Z91040 Latex allergy status: Secondary | ICD-10-CM

## 2022-11-11 DIAGNOSIS — N1831 Chronic kidney disease, stage 3a: Secondary | ICD-10-CM | POA: Diagnosis present

## 2022-11-11 DIAGNOSIS — N179 Acute kidney failure, unspecified: Secondary | ICD-10-CM | POA: Diagnosis present

## 2022-11-11 DIAGNOSIS — Z888 Allergy status to other drugs, medicaments and biological substances status: Secondary | ICD-10-CM

## 2022-11-11 DIAGNOSIS — R1310 Dysphagia, unspecified: Secondary | ICD-10-CM | POA: Diagnosis present

## 2022-11-11 DIAGNOSIS — Z90710 Acquired absence of both cervix and uterus: Secondary | ICD-10-CM

## 2022-11-11 DIAGNOSIS — M5441 Lumbago with sciatica, right side: Secondary | ICD-10-CM | POA: Diagnosis present

## 2022-11-11 DIAGNOSIS — Z66 Do not resuscitate: Secondary | ICD-10-CM | POA: Diagnosis present

## 2022-11-11 DIAGNOSIS — Z79899 Other long term (current) drug therapy: Secondary | ICD-10-CM

## 2022-11-11 LAB — CBC
HCT: 45.7 % (ref 36.0–46.0)
Hemoglobin: 14.8 g/dL (ref 12.0–15.0)
MCH: 29.4 pg (ref 26.0–34.0)
MCHC: 32.4 g/dL (ref 30.0–36.0)
MCV: 90.9 fL (ref 80.0–100.0)
Platelets: 257 10*3/uL (ref 150–400)
RBC: 5.03 MIL/uL (ref 3.87–5.11)
RDW: 14.1 % (ref 11.5–15.5)
WBC: 9.8 10*3/uL (ref 4.0–10.5)
nRBC: 0 % (ref 0.0–0.2)

## 2022-11-11 LAB — BASIC METABOLIC PANEL
Anion gap: 13 (ref 5–15)
BUN: 16 mg/dL (ref 8–23)
CO2: 24 mmol/L (ref 22–32)
Calcium: 10.1 mg/dL (ref 8.9–10.3)
Chloride: 100 mmol/L (ref 98–111)
Creatinine, Ser: 1.25 mg/dL — ABNORMAL HIGH (ref 0.44–1.00)
GFR, Estimated: 43 mL/min — ABNORMAL LOW (ref >60)
Glucose, Bld: 304 mg/dL — ABNORMAL HIGH (ref 70–99)
Potassium: 4.3 mmol/L (ref 3.5–5.1)
Sodium: 137 mmol/L (ref 135–145)

## 2022-11-11 MED ORDER — MORPHINE SULFATE (PF) 2 MG/ML IV SOLN
2.0000 mg | Freq: Once | INTRAVENOUS | Status: AC
  Administered 2022-11-11: 2 mg via INTRAVENOUS
  Filled 2022-11-11: qty 1

## 2022-11-11 MED ORDER — ONDANSETRON HCL 4 MG/2ML IJ SOLN
4.0000 mg | Freq: Once | INTRAMUSCULAR | Status: AC
  Administered 2022-11-11: 4 mg via INTRAVENOUS
  Filled 2022-11-11: qty 2

## 2022-11-11 NOTE — ED Provider Notes (Signed)
[facility] Provider Note   CSN: [id number] Arrival date & time: 11/11/22  1551     History {Add pertinent medical, surgical, social history, OB history to HPI:1} Chief Complaint  Patient presents with   Back Pain   Fall    Heidi [name] is a 82 y.o. female.  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 Posey Pronto with [facility] neurology, for evaluation of myasthenia gravis exacerbation.  Patient reports increasing lower extremity weakness bilaterally over the past 1 week.  She has had increasing falls at home, stating that she has fallen 5 times in the past week.  After a fall on January 30, she developed pain in her lower back and into her right leg.  This has continued to give her problems, especially with movement.  No fevers, nausea, vomiting.  She has been having some diarrhea which she related to one of her medications for myasthenia gravis.  Her dosage was recently adjusted.  No urinary symptoms, bladder dysfunction or urinary retention.       Home Medications Prior to Admission medications   Medication Sig Start Date End Date Taking? Authorizing Provider  acetaminophen (TYLENOL) 325 MG tablet Take 2 tablets (650 mg total) by mouth every 6 (six) hours as needed for mild pain (or Fever >/= 101). 05/30/22   Elgergawy, Silver Huguenin, MD  Cholecalciferol (VITAMIN D-3) 125 MCG (5000 UT) TABS Take 5,000 Units by mouth daily.    [provider]  cholestyramine (QUESTRAN) 4 g packet Take 4 g by mouth daily. 08/13/18   [provider]  cyanocobalamin 1000 MCG tablet Take 1 tablet (1,000 mcg total) by mouth daily. 05/31/22   Elgergawy, Silver Huguenin, MD  feeding supplement  (ENSURE ENLIVE / ENSURE PLUS) LIQD Take 237 mLs by mouth daily. 05/30/22   Elgergawy, Silver Huguenin, MD  fenofibrate 160 MG tablet Take 160 mg by mouth daily. Take with food 12/30/18   [provider]  ferrous sulfate 325 (65 FE) MG tablet Take 325 mg by mouth daily.    [provider]  fexofenadine-pseudoephedrine (ALLEGRA-D) 60-120 MG 12 hr tablet Take 1 tablet by mouth 2 (two) times daily.    [provider]  fluticasone (FLONASE) 50 MCG/ACT nasal spray Place 1 spray into the nose 2 (two) times daily.    [provider]  furosemide (LASIX) 20 MG tablet TAKE 1 TABLET BY MOUTH 3 TIMES A WEEK. Patient taking differently: Take 20 mg by mouth every Monday, Wednesday, and Friday. 05/07/21   Jerline Pain, MD  gabapentin (NEURONTIN) 300 MG capsule Take 300 mg by mouth 3 (three) times daily.    [provider]  galantamine (RAZADYNE ER) 8 MG 24 hr capsule Take 8 mg by mouth daily. 08/13/18   [provider]  hydrOXYzine (ATARAX/VISTARIL) 25 MG tablet Take 25 mg by mouth at bedtime.    [provider]  levothyroxine (SYNTHROID, LEVOTHROID) 75 MCG tablet Take 75 mcg by mouth daily.    [provider]  melatonin 3 MG TABS tablet Take 1 tablet (3 mg total) by mouth at bedtime. 05/30/22   Elgergawy, Silver Huguenin, MD  metFORMIN (GLUCOPHAGE) 500 MG tablet Take 500 mg by mouth 2 (two) times daily.    [provider]  methocarbamol (ROBAXIN)  500 MG tablet Take 1 tablet (500 mg total) by mouth every 8 (eight) hours as needed for muscle spasms. 05/03/22   McBane, Maylene Roes, PA-C  metoprolol succinate (TOPROL-XL) 100 MG 24 hr tablet TAKE 1 TABLET(100 MG) BY MOUTH DAILY 04/25/21   Jerline Pain, MD  Multiple Vitamins-Minerals (ADULT GUMMY PO) Take 1 capsule by mouth daily.    [provider]  niacin (VITAMIN B3) 100 MG tablet Take 1 tablet (100 mg total) by mouth at bedtime. 05/30/22   Elgergawy, Silver Huguenin, MD  nortriptyline (PAMELOR) 50 MG  capsule Take 2 capsules (100 mg total) by mouth at bedtime. 05/30/22   Elgergawy, Silver Huguenin, MD  pantoprazole (PROTONIX) 40 MG tablet Take 40 mg by mouth 2 (two) times daily. 06/01/18   [provider]  predniSONE (DELTASONE) 20 MG tablet Take 1 tablet (20 mg total) by mouth daily with breakfast. 10/30/22   Narda Amber K, DO  PRESCRIPTION MEDICATION Inhale into the lungs at bedtime. CPAP    [provider]  pyridostigmine (MESTINON) 60 MG tablet Take half-tablet at 10a and 4pm. 10/30/22   Narda Amber K, DO  sodium chloride (OCEAN) 0.65 % SOLN nasal spray Place 1 spray into both nostrils every 2 (two) hours as needed for congestion.    [provider]  spironolactone (ALDACTONE) 25 MG tablet Take 1 tablet (25 mg total) by mouth daily. Patient taking differently: Take 25 mg by mouth at bedtime. 10/03/17   Deboraha Sprang, MD      Allergies    Crestor [rosuvastatin calcium], Effexor [venlafaxine hydrochloride], Aricept [donepezil], Latex, Nardil [phenelzine], Venlafaxine, Atorvastatin, Clopidogrel, Hibiclens [chlorhexidine gluconate], Lipitor [atorvastatin calcium], Lisinopril, Nardil, Penicillins, Plavix [clopidogrel bisulfate], Simvastatin, Sulfa antibiotics, Sulfonamide derivatives, and Tape    Review of Systems   Review of Systems  Physical Exam Updated Vital Signs BP 132/71   Pulse 99   Temp 98.4 F (36.9 C)   Resp 16   Ht '5\' 1"'$  (1.549 m)   Wt 69.9 kg   SpO2 96%   BMI 29.10 kg/m   Physical Exam Vitals and nursing note reviewed.  Constitutional:      General: She is not in acute distress.    Appearance: She is well-developed.  HENT:     Head: Normocephalic and atraumatic.     Right Ear: External ear normal.     Left Ear: External ear normal.     Nose: Nose normal.     Mouth/Throat:     Mouth: Mucous membranes are moist.  Eyes:     Conjunctiva/sclera: Conjunctivae normal.  Cardiovascular:     Rate and Rhythm: Normal rate and regular rhythm.      Heart sounds: No murmur heard. Pulmonary:     Effort: No respiratory distress.     Breath sounds: No wheezing, rhonchi or rales.  Abdominal:     Palpations: Abdomen is soft.     Tenderness: There is no abdominal tenderness. There is no guarding or rebound.  Musculoskeletal:     Cervical back: Normal range of motion and neck supple.     Thoracic back: No tenderness.     Lumbar back: Tenderness and bony tenderness present.       Back:     Right lower leg: No edema.     Left lower leg: No edema.     Comments: Tenderness to palpation in the right lower lumbar and sacral area  Skin:    General: Skin is warm and dry.  Findings: No rash.  Neurological:     Mental Status: She is alert. Mental status is at baseline.     Cranial Nerves: No cranial nerve deficit.     Sensory: No sensory deficit.     Comments: Patellar reflexes seem decreased  Psychiatric:        Mood and Affect: Mood normal.     ED Results / Procedures / Treatments   Labs (all labs ordered are listed, but only abnormal results are displayed) Labs Reviewed  BASIC METABOLIC PANEL - Abnormal; Notable for the following components:      Result Value   Glucose, Bld 304 (*)    Creatinine, Ser 1.25 (*)    GFR, Estimated 43 (*)    All other components within normal limits  CBC  URINALYSIS, ROUTINE W REFLEX MICROSCOPIC  CBG MONITORING, ED    EKG None  Radiology CT Head Wo Contrast  Result Date: 11/11/2022 CLINICAL DATA:  Neurological deficit.  Evaluate for acute stroke. EXAM: CT HEAD WITHOUT CONTRAST TECHNIQUE: Contiguous axial images were obtained from the base of the skull through the vertex without intravenous contrast. RADIATION DOSE REDUCTION: This exam was performed according to the departmental dose-optimization program which includes automated exposure control, adjustment of the mA and/or kV according to patient size and/or use of iterative reconstruction technique. COMPARISON:  May 29, 2022 FINDINGS: Brain:  There is mild cerebral atrophy with widening of the extra-axial spaces and ventricular dilatation. There are areas of decreased attenuation within the white matter tracts of the supratentorial brain, consistent with microvascular disease changes. Vascular: There is moderate severity calcification of the bilateral cavernous carotid arteries. Skull: Normal. Negative for fracture or focal lesion. Sinuses/Orbits: No acute finding. Other: None. IMPRESSION: 1. No acute intracranial abnormality. 2. Generalized cerebral atrophy and microvascular disease changes of the supratentorial brain. Electronically Signed   By: Virgina Norfolk M.D.   On: 11/11/2022 19:23    Procedures Procedures  {Document cardiac monitor, telemetry assessment procedure when appropriate:1}  Medications Ordered in ED Medications - No data to display  ED Course/ Medical Decision Making/ A&P    Patient seen and examined. History obtained directly from patient. I also reviewed external neurology notes.    Labs/EKG: Personally reviewed and interpreted CBC unremarkable; BMP with glucose elevated at 304 with normal anion gap, creatinine mildly elevated at 1.25.  Imaging: Personally reviewed and interpreted CT head, agree no acute abnormality.  Medications/Fluids: None ordered  Most recent vital signs reviewed and are as follows: BP (!) 145/78 (BP Location: Right Arm)   Pulse 86   Temp 98.6 F (37 C) (Oral)   Resp 15   Ht '5\' 1"'$  (1.549 m)   Wt 69.9 kg   SpO2 96%   BMI 29.10 kg/m   Initial impression: Lower extremity weakness and increasing falls, eval for myasthenia gravis exacerbation.    {   Click here for ABCD2, HEART and other calculatorsREFRESH Note before signing :1}                          Medical Decision Making Amount and/or Complexity of Data Reviewed Radiology: ordered.   ***  {Document critical care time when appropriate:1} {Document review of labs and clinical decision tools ie heart score,  Chads2Vasc2 etc:1}  {Document your independent review of radiology images, and any outside records:1} {Document your discussion with family members, caretakers, and with consultants:1} {Document social determinants of health affecting pt's care:1} {Document your decision making why  or why not admission, treatments were needed:1} Final Clinical Impression(s) / ED Diagnoses Final diagnoses:  None    Rx / DC Orders ED Discharge Orders     None

## 2022-11-11 NOTE — ED Provider Triage Note (Signed)
Emergency Medicine Provider Triage Evaluation Note  Heidi [name] , a 82 y.o. female  was evaluated in triage.  Pt complains of multiple recent falls due to weakness in her bilateral lower extremities.  Patient states that she has had increasing weakness in her legs for at least 3 years now and uses a walker to ambulate at baseline.  She was diagnosed with myasthenia gravis by outpatient neurology and placed on pyridostigmine and steroids but has not noticed any improvement in her symptoms.  States that over the last week she has become unable to ambulate even with her walker.  She resides in a rehab facility but is still having frequent falls and with worsening of her symptoms despite being on appropriate medications patient's daughter contacted her outpatient neurologist who recommended that she come to the ED to be admitted for treatment of a myasthenia gravis crisis.  Patient has never required admission for this before.  She states that she remembers all of the falls and they always occur because she is so weak in her legs.  She has had some lower back pain that radiates around to right hip and down her right leg since one of the recent falls but otherwise denies any new injuries including hitting her head as well as syncope, dizziness, nausea, vomiting, vision changes, SOB, chest pain, fever, or chills.   Review of Systems  Positive: See HPI Negative: See HPI  Physical Exam  BP 132/71   Pulse 99   Temp 98.4 F (36.9 C)   Resp 16   Ht '5\' 1"'$  (1.549 m)   Wt 69.9 kg   SpO2 96%   BMI 29.10 kg/m  Gen:   Awake, no distress   Resp:  Normal effort LCTA MSK:   Moves extremities without difficulty  Other:  RRR, 4/5 strength bilateral LE appears symmetrical, 5/5 strength bilateral upper extremities, CNI, normal speech  Medical Decision Making  Medically screening exam initiated at 4:18 PM.  Appropriate orders placed.  [name] was informed that the remainder of the evaluation will be completed  by another provider, this initial triage assessment does not replace that evaluation, and the importance of remaining in the ED until their evaluation is complete.     Suzzette Righter, PA-C 11/11/22 1624

## 2022-11-11 NOTE — ED Provider Notes (Incomplete)
[facility] Provider Note   CSN: [id number] Arrival date & time: 11/11/22  1551     History {Add pertinent medical, surgical, social history, OB history to HPI:1} Chief Complaint  Patient presents with  . Back Pain  . Fall    Heidi [name] is a 82 y.o. female.  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 Posey Pronto with [facility] neurology, for evaluation of myasthenia gravis exacerbation.  Patient reports increasing lower extremity weakness bilaterally over the past 1 week.  She has had increasing falls at home, stating that she has fallen 5 times in the past week.  After a fall on January 30, she developed pain in her lower back and into her right leg.  This has continued to give her problems, especially with movement.  No fevers, nausea, vomiting.  She has been having some diarrhea which she related to one of her medications for myasthenia gravis.  Her dosage was recently adjusted.  No urinary symptoms, bladder dysfunction or urinary retention.       Home Medications Prior to Admission medications   Medication Sig Start Date End Date Taking? Authorizing Provider  acetaminophen (TYLENOL) 325 MG tablet Take 2 tablets (650 mg total) by mouth every 6 (six) hours as needed for mild pain (or Fever >/= 101). 05/30/22   Elgergawy, Silver Huguenin, MD  Cholecalciferol (VITAMIN D-3) 125 MCG (5000 UT) TABS Take 5,000 Units by mouth daily.    [provider]  cholestyramine (QUESTRAN) 4 g packet Take 4 g by mouth daily. 08/13/18   [provider]  cyanocobalamin 1000 MCG tablet Take 1 tablet (1,000 mcg total) by mouth daily. 05/31/22   Elgergawy, Silver Huguenin, MD   feeding supplement (ENSURE ENLIVE / ENSURE PLUS) LIQD Take 237 mLs by mouth daily. 05/30/22   Elgergawy, Silver Huguenin, MD  fenofibrate 160 MG tablet Take 160 mg by mouth daily. Take with food 12/30/18   [provider]  ferrous sulfate 325 (65 FE) MG tablet Take 325 mg by mouth daily.    [provider]  fexofenadine-pseudoephedrine (ALLEGRA-D) 60-120 MG 12 hr tablet Take 1 tablet by mouth 2 (two) times daily.    [provider]  fluticasone (FLONASE) 50 MCG/ACT nasal spray Place 1 spray into the nose 2 (two) times daily.    [provider]  furosemide (LASIX) 20 MG tablet TAKE 1 TABLET BY MOUTH 3 TIMES A WEEK. Patient taking differently: Take 20 mg by mouth every Monday, Wednesday, and Friday. 05/07/21   Jerline Pain, MD  gabapentin (NEURONTIN) 300 MG capsule Take 300 mg by mouth 3 (three) times daily.    [provider]  galantamine (RAZADYNE ER) 8 MG 24 hr capsule Take 8 mg by mouth daily. 08/13/18   [provider]  hydrOXYzine (ATARAX/VISTARIL) 25 MG tablet Take 25 mg by mouth at bedtime.    [provider]  levothyroxine (SYNTHROID, LEVOTHROID) 75 MCG tablet Take 75 mcg by mouth daily.    [provider]  melatonin 3 MG TABS tablet Take 1 tablet (3 mg total) by mouth at bedtime. 05/30/22   Elgergawy, Silver Huguenin, MD  metFORMIN (GLUCOPHAGE) 500 MG tablet Take 500 mg by mouth 2 (two) times daily.    [provider]  methocarbamol (ROBAXIN) 500 MG tablet Take 1 tablet (500 mg total) by mouth every 8 (eight) hours as needed for muscle spasms. 05/03/22   McBane, Maylene Roes, PA-C  metoprolol succinate (TOPROL-XL) 100 MG 24 hr tablet TAKE 1 TABLET(100 MG) BY MOUTH DAILY 04/25/21   Jerline Pain, MD  Multiple Vitamins-Minerals (ADULT GUMMY PO) Take 1 capsule by mouth daily.    [provider]  niacin (VITAMIN B3) 100 MG tablet Take 1 tablet (100 mg total) by mouth at bedtime. 05/30/22   Elgergawy, Silver Huguenin, MD   nortriptyline (PAMELOR) 50 MG capsule Take 2 capsules (100 mg total) by mouth at bedtime. 05/30/22   Elgergawy, Silver Huguenin, MD  pantoprazole (PROTONIX) 40 MG tablet Take 40 mg by mouth 2 (two) times daily. 06/01/18   [provider]  predniSONE (DELTASONE) 20 MG tablet Take 1 tablet (20 mg total) by mouth daily with breakfast. 10/30/22   Narda Amber K, DO  PRESCRIPTION MEDICATION Inhale into the lungs at bedtime. CPAP    [provider]  pyridostigmine (MESTINON) 60 MG tablet Take half-tablet at 10a and 4pm. 10/30/22   Narda Amber K, DO  sodium chloride (OCEAN) 0.65 % SOLN nasal spray Place 1 spray into both nostrils every 2 (two) hours as needed for congestion.    [provider]  spironolactone (ALDACTONE) 25 MG tablet Take 1 tablet (25 mg total) by mouth daily. Patient taking differently: Take 25 mg by mouth at bedtime. 10/03/17   Deboraha Sprang, MD      Allergies    Crestor [rosuvastatin calcium], Effexor [venlafaxine hydrochloride], Aricept [donepezil], Latex, Nardil [phenelzine], Venlafaxine, Atorvastatin, Clopidogrel, Hibiclens [chlorhexidine gluconate], Lipitor [atorvastatin calcium], Lisinopril, Nardil, Penicillins, Plavix [clopidogrel bisulfate], Simvastatin, Sulfa antibiotics, Sulfonamide derivatives, and Tape    Review of Systems   Review of Systems  Physical Exam Updated Vital Signs BP 132/71   Pulse 99   Temp 98.4 F (36.9 C)   Resp 16   Ht '5\' 1"'$  (1.549 m)   Wt 69.9 kg   SpO2 96%   BMI 29.10 kg/m   Physical Exam Vitals and nursing note reviewed.  Constitutional:      General: She is not in acute distress.    Appearance: She is well-developed.  HENT:     Head: Normocephalic and atraumatic.     Right Ear: External ear normal.     Left Ear: External ear normal.     Nose: Nose normal.     Mouth/Throat:     Mouth: Mucous membranes are moist.  Eyes:     Conjunctiva/sclera: Conjunctivae normal.  Cardiovascular:     Rate and Rhythm: Normal  rate and regular rhythm.     Heart sounds: No murmur heard. Pulmonary:     Effort: No respiratory distress.     Breath sounds: No wheezing, rhonchi or rales.  Abdominal:     Palpations: Abdomen is soft.     Tenderness: There is no abdominal tenderness. There is no guarding or rebound.  Musculoskeletal:     Cervical back: Normal range of motion and neck supple.     Thoracic back: No tenderness.     Lumbar back: Tenderness and bony tenderness present.       Back:     Right lower leg: No edema.     Left lower leg: No edema.     Comments: Tenderness to palpation in the right lower lumbar and sacral area  Skin:    General: Skin is warm and dry.  Findings: No rash.  Neurological:     Mental Status: She is alert. Mental status is at baseline.     Cranial Nerves: No cranial nerve deficit.     Sensory: No sensory deficit.     Comments: Patellar reflexes seem decreased  Psychiatric:        Mood and Affect: Mood normal.     ED Results / Procedures / Treatments   Labs (all labs ordered are listed, but only abnormal results are displayed) Labs Reviewed  BASIC METABOLIC PANEL - Abnormal; Notable for the following components:      Result Value   Glucose, Bld 304 (*)    Creatinine, Ser 1.25 (*)    GFR, Estimated 43 (*)    All other components within normal limits  CBC  URINALYSIS, ROUTINE W REFLEX MICROSCOPIC  CBG MONITORING, ED    EKG None  Radiology CT Head Wo Contrast  Result Date: 11/11/2022 CLINICAL DATA:  Neurological deficit.  Evaluate for acute stroke. EXAM: CT HEAD WITHOUT CONTRAST TECHNIQUE: Contiguous axial images were obtained from the base of the skull through the vertex without intravenous contrast. RADIATION DOSE REDUCTION: This exam was performed according to the departmental dose-optimization program which includes automated exposure control, adjustment of the mA and/or kV according to patient size and/or use of iterative reconstruction technique. COMPARISON:   May 29, 2022 FINDINGS: Brain: There is mild cerebral atrophy with widening of the extra-axial spaces and ventricular dilatation. There are areas of decreased attenuation within the white matter tracts of the supratentorial brain, consistent with microvascular disease changes. Vascular: There is moderate severity calcification of the bilateral cavernous carotid arteries. Skull: Normal. Negative for fracture or focal lesion. Sinuses/Orbits: No acute finding. Other: None. IMPRESSION: 1. No acute intracranial abnormality. 2. Generalized cerebral atrophy and microvascular disease changes of the supratentorial brain. Electronically Signed   By: Virgina Norfolk M.D.   On: 11/11/2022 19:23    Procedures Procedures  {Document cardiac monitor, telemetry assessment procedure when appropriate:1}  Medications Ordered in ED Medications  ondansetron [facility]) injection 4 mg (4 mg Intravenous Given 11/11/22 2208)  morphine (PF) 2 MG/ML injection 2 mg (2 mg Intravenous Given 11/11/22 2208)    ED Course/ Medical Decision Making/ A&P    Patient seen and examined. History obtained directly from patient. I also reviewed external neurology notes.    Labs/EKG: Personally reviewed and interpreted CBC unremarkable; BMP with glucose elevated at 304 with normal anion gap, creatinine mildly elevated at 1.25.  Imaging: Personally reviewed and interpreted CT head, agree no acute abnormality.  Medications/Fluids: None ordered  Most recent vital signs reviewed and are as follows: BP (!) 145/78 (BP Location: Right Arm)   Pulse 86   Temp 98.6 F (37 C) (Oral)   Resp 15   Ht '5\' 1"'$  (1.549 m)   Wt 69.9 kg   SpO2 96%   BMI 29.10 kg/m   Initial impression: Lower extremity weakness and increasing falls, eval for myasthenia gravis exacerbation.  10:30 PM I have consulted with Dr. Curly Shores by telephone and requested consult. Currently awaiting recommendations.   Imaging personally visualized and interpreted including: CT  lumbar spine, agree no compression fractures but she does have advanced disc space narrowing at L2-L3.  Most current vital signs reviewed and are as follows: BP (!) 143/83 (BP Location: Right Arm)   Pulse 86   Temp 98.6 F (37 C) (Oral)   Resp 16   Ht '5\' 1"'$  (1.549 m)   Wt 69.9 kg  SpO2 98%   BMI 29.10 kg/m   Plan: Patient was requesting medication for pain and 2 mg IV morphine and 4 mg IV Zofran ordered.  12:00 AM Reccs from neurology -- admit for treatment. Currently finalizing recommendations. Will admit to hospitalist service.    Click here for ABCD2, HEART and other calculatorsREFRESH Note before signing :1}                          Medical Decision Making Amount and/or Complexity of Data Reviewed Radiology: ordered.  Risk Prescription drug management.   Patient here with progressive weakness in her legs, concern for myasthenia gravis exacerbation.  She is currently being treated for this and followed by neurology.  She has had some back pain after a fall.  No spinal fractures noted on CT.  {Document critical care time when appropriate:1} {Document review of labs and clinical decision tools ie heart score, Chads2Vasc2 etc:1}  {Document your independent review of radiology images, and any outside records:1} {Document your discussion with family members, caretakers, and with consultants:1} {Document social determinants of health affecting pt's care:1} {Document your decision making why or why not admission, treatments were needed:1} Final Clinical Impression(s) / ED Diagnoses Final diagnoses:  Weakness of both lower extremities  Frequent falls  Acute midline low back pain without sciatica    Rx / DC Orders ED Discharge Orders     None

## 2022-11-11 NOTE — ED Triage Notes (Signed)
Pt BIB EMS for back pain and multiple falls in the last 5 days. Pt endorses intermittent dizziness. Pt has myasthenia gravis.    140/80 HR 100 CBG 319

## 2022-11-11 NOTE — Consult Note (Incomplete)
Neurology Consultation Reason for Consult: c/f MG Flare Requesting Physician: Lacretia Leigh  CC: Increasing falls  History is obtained from: Patient and chart review  HPI: Heidi [name] is a 82 y.o. female with a past medical history significant for hypertension, hyperlipidemia, diabetes, obstructive sleep apnea on CPAP, hypothyroidism, tachy/bradycardia syndrome s/p pacemaker, CKD stage III, neuropathy, mild cognitive impairment, seronegative myasthenia gravis (follows with Dr. Posey Pronto, EMG diagnostic)  Her myasthenia was diagnosed after she developed symptoms of right eye ptosis, double vision predominantly on the lateral gaze, dysarthria, swallowing difficulty and leg weakness/falls.   AChR and MuSK antibody negative, thymoma negative, EMG consistent with myasthenia gravis (October 2023)   Patient reports symptoms improved with initiation of Mestinon and then transiently improved further with initiation of prednisone however she feels that the Mestinon was more helpful than the prednisone with prednisone losing efficacy after a few days.  Unfortunately she has not tolerated Mestinon well due to GI side effects of frequent diarrhea and abdominal cramping.  Therefore her dose of Mestinon was most recently decreased from 30/60/60 mg (9 AM, 2 PM, 5 PM) to 30/30 mg (10 AM and 4 PM).  Her prednisone was increased from 15 mg to 20 mg and she feels like she is having some swelling and rashes related to this as well as weight gain.  She has had some cough and congestion for the past 2 weeks which is actually been improving the last 2 days.  She has noted an increase in her myasthenia symptoms of worsening double vision and worsening falls although her swallowing and breathing have been stable.  She denies any new medications  She describes several example falls to me: 1) when getting up from the commode fell to the left on Saturday which was followed by development of severe right-sided sciatica the next day  2)  Earlier in the week when she had an episode while she was getting dressed sitting on her rollator went to pick something up slid and tumbled 3) while in the elevator she was trying to turn around to face the doors after getting in and suddenly fell.  Noting DNR paperwork at bedside we specifically discussed that at times with myasthenia temporary intubation is required and expected to be reversible with immunomodulatory treatment.  She indicates that in this case she would agree to intubation.  She does not want CPR.  She confirms her daughter would be her decision-maker if she is unable to make decisions for herself  ROS: All other review of systems was negative except as noted in the HPI.   Past Medical History:  Diagnosis Date  . Anxiety   . Atrial fibrillation (HCC)    Intermittent  . CAD (coronary artery disease)    non obstructive  . CKD (chronic kidney disease)   . Diabetes mellitus without complication ([place])   . Dysrhythmia    PAF  . GERD (gastroesophageal reflux disease)   . Glaucoma    [place]  . Heart murmur   . HTN (hypertension)   . Hyperlipidemia   . Hypothyroid   . Major depression    10-17-2010: hospitalized for suicidal, [name] (D/C 10-30-2010)  . Migraine headache   . OSA (obstructive sleep apnea)    CPAP nightly  . Peripheral neuropathy   . Presence of permanent cardiac pacemaker   . Seasonal allergies   . Sleep apnea   . Tachy-brady syndrome ([place]) 04/06/2006   PPM placed   Past Surgical History:  Procedure Laterality Date  . ABDOMINAL ADHESION SURGERY  2008   exploratory to remove attached to the abdominal wall, stomach and intestines.   . ABDOMINAL HYSTERECTOMY  1976  . APPENDECTOMY    . BREAST EXCISIONAL BIOPSY    . BREAST SURGERY     fibroids removed, benign  . CARDIAC CATHETERIZATION  02-12-2006   Minor irregularities: RCA-mid 40%, LM-normal, LAD-normal. EF 65%.  . CHOLECYSTECTOMY    . ECTOPIC PREGNANCY SURGERY      treatment x3, last one 11-02-2009 at [facility]  . goiter    . goiter removed    . MASS EXCISION Right 10/18/2015   Procedure: EXCISION OF 6CM MASS ON RIGHT  NECK/BACK;  Surgeon: [name], MD;  Location: [place];  Service: General;  Laterality: Right;  . PACEMAKER GENERATOR CHANGE N/A 01/15/2012   Procedure: PACEMAKER GENERATOR CHANGE;  Surgeon: Deboraha Sprang, MD;  Location: [facility] CATH LAB;  Service: Cardiovascular;  Laterality: N/A;  . PACEMAKER INSERTION     Permanent. [employer] EnRhyth 04/2006, set as DDDR  . REVERSE SHOULDER ARTHROPLASTY Right 05/01/2022   Procedure: REVERSE SHOULDER ARTHROPLASTY;  Surgeon: Hiram Gash, MD;  Location: [facility] ORS;  Service: Orthopedics;  Laterality: Right;  . TUMOR REMOVAL  2000   Fibroid from breast x4-most recent  . WRIST FRACTURE SURGERY  11-2007   right, had metal plate inserted   Current Outpatient Medications  Medication Instructions  . acetaminophen (TYLENOL) 650 mg, Oral, Every 6 hours PRN  . cholestyramine (QUESTRAN) 4 g, Oral, Daily  . cyanocobalamin 1,000 mcg, Oral, Daily  . feeding supplement (ENSURE ENLIVE / ENSURE PLUS) LIQD 237 mLs, Oral, Every 24 hours  . fenofibrate 160 mg, Oral, Daily, Take with food  . ferrous sulfate 325 mg, Oral, Daily  . fexofenadine-pseudoephedrine (ALLEGRA-D) 60-120 MG 12 hr tablet 1 tablet, Oral, 2 times daily  . fluticasone (FLONASE) 50 MCG/ACT nasal spray 1 spray, Nasal, 2 times daily  . furosemide (LASIX) 20 MG tablet TAKE 1 TABLET BY MOUTH 3 TIMES A WEEK.  . gabapentin (NEURONTIN) 300 mg, Oral, 3 times daily,    . galantamine (RAZADYNE ER) 8 mg, Oral, Daily  . hydrOXYzine (ATARAX) 25 mg, Oral, Daily at bedtime  . levothyroxine (SYNTHROID) 75 mcg, Oral, Daily,    . melatonin 3 mg, Oral, Daily at bedtime  . metFORMIN (GLUCOPHAGE) 500 mg, Oral, 2 times daily  . methocarbamol (ROBAXIN) 500 mg, Oral, Every 8 hours PRN  . metoprolol succinate (TOPROL-XL) 100 MG 24 hr tablet TAKE 1  TABLET(100 MG) BY MOUTH DAILY  . Multiple Vitamins-Minerals (ADULT GUMMY PO) 1 capsule, Oral, Daily  . niacin (VITAMIN B3) 100 mg, Oral, Daily at bedtime  . nortriptyline (PAMELOR) 100 mg, Oral, Daily at bedtime  . pantoprazole (PROTONIX) 40 mg, Oral, 2 times daily  . predniSONE (DELTASONE) 20 mg, Oral, Daily with breakfast  . PRESCRIPTION MEDICATION Inhalation, Daily at bedtime, CPAP  . pyridostigmine (MESTINON) 60 MG tablet Take half-tablet at 10a and 4pm.  . sodium chloride (OCEAN) 0.65 % SOLN nasal spray 1 spray, Each Nare, Every 2 hours PRN  . spironolactone (ALDACTONE) 25 mg, Oral, Daily  . Vitamin D-3 5,000 Units, Oral, Daily     Family History  Problem Relation Age of Onset  . Stomach cancer Father   . Atrial fibrillation Brother     Social History:  reports that she has quit smoking. She has never used smokeless tobacco. She reports that she does not drink alcohol and does  not use drugs.   Exam: Current vital signs: BP (!) 143/83 (BP Location: Right Arm)   Pulse 86   Temp 98.6 F (37 C) (Oral)   Resp 16   Ht '5\' 1"'$  (1.549 m)   Wt 69.9 kg   SpO2 98%   BMI 29.10 kg/m  Vital signs in last 24 hours: Temp:  [98.4 F (36.9 C)-98.6 F (37 C)] 98.6 F (37 C) (02/05 2043) Pulse Rate:  [86-99] 86 (02/05 2211) Resp:  [15-16] 16 (02/05 2211) BP: (132-145)/(71-83) 143/83 (02/05 2211) SpO2:  [96 %-98 %] 98 % (02/05 2211) Weight:  [69.9 kg] 69.9 kg (02/05 1600)   Physical Exam  Constitutional: Appears well-developed and well-nourished.  Very uncomfortable in the bed due to her right-sided sciatica for which she has just received morphine prior to my evaluation Psych: Affect appropriate to situation, pleasant and cooperative, mildly anxious Eyes: No scleral injection HENT: No oropharyngeal obstruction.  MSK: no joint deformities.  Cardiovascular: Perfusing extremities well Respiratory: Effort normal, non-labored breathing.  single breath test 21 GI: Soft.  No  distension. There is no tenderness.  Skin: Warm dry and intact visible skin  Neuro: Mental Status: Patient is awake, alert, oriented to person, place, month, year, and situation. Patient is able to give a clear and coherent history. No signs of aphasia or neglect Fluent speech, speaking in full sentences Cranial Nerves: II: Visual Fields are full. Pupils are equal, round, and reactive to light.   III,IV, VI: No diplopia on sustained upgaze but she does start to have right eye worsening ptosis within 2 minutes.  She does have diplopia on lateral gaze bilaterally. V: Facial sensation is symmetric to temperature VII: Facial movement is symmetric.  VIII: hearing is intact to voice X: Uvula elevates symmetrically XI: Shoulder shrug is symmetric. XII: tongue is midline without atrophy or fasciculations.  Motor: Tone is normal. Bulk is normal.  Mild deltoid weakness 4+/5 bilaterally.  Right leg motor testing deferred by patient secondary to sciatica.  Left leg testing with 4+/5 hip flexor strength otherwise 5/5.  Neck flexion 4-/5 Sensory: Sensation is symmetric to light touch and temperature in the arms and legs. Deep Tendon Reflexes: 2+ and symmetric in the brachioradialis and patellae.  Cerebellar: Finger-nose is intact bilaterally Gait:  Deferred in acute setting    I have reviewed labs in epic and the results pertinent to this consultation are:  Basic Metabolic Panel: Recent Labs  Lab 11/11/22 1627  NA 137  K 4.3  CL 100  CO2 24  GLUCOSE 304*  BUN 16  CREATININE 1.25*  CALCIUM 10.1    CBC: Recent Labs  Lab 11/11/22 1627  WBC 9.8  HGB 14.8  HCT 45.7  MCV 90.9  PLT 257    Coagulation Studies: No results for input(s): "LABPROT", "INR" in the last 72 hours.    I have reviewed the images obtained:  CT head personally reviewed, agree with radiology:  1. No acute intracranial abnormality. 2. Generalized cerebral atrophy and microvascular disease changes of the  supratentorial brain.  CT L-spine personally reviewed, agree with radiology:   1. No acute osseous injury within the lumbar spine. 2. 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 3. 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  Impression: 82 year old woman with past medical history as detailed above presenting with worsening myasthenia gravis symptoms of increased double vision and bilateral lower extremity weakness.  Potential trigger of recent upper  respiratory infection which is now improving.  Laboratory evaluation additionally noted for AKI on CKD.  Given her difficulty tolerating Mestinon and worsening symptoms despite increase of prednisone I do think it is reasonable to admit her for acute inpatient treatment for exacerbation to reduce the risk of further falls and injury  Recommendations: -Every 8 hour NIF and vital capacity testing -RVP to assess for etiology of exacerbation -*** -Admit, neurology will follow along in consultation  Drugs to avoid if you have Myasthenia Gravis:   Many drugs have been reported to have adverse effects in patients with MG (see below). However, not all patients react adversely to all these drugs. Conversely, not all "safe" drugs can be used with impunity in patients with MG.As a rule, the listed drugs should be avoided whenever possible, and patients with MG should be followed closely when any new drug is introduced.  Drugs that may exacerbate MG  Antibiotics  Aminoglycosides: e.g., streptomycin, tobramycin, kanamycin  Quinolones: e.g., ciprofloxacin, levofloxacin, ofloxacin, gatifloxacin  Macrolides: e.g., erythromycin, azithromycin, telithromycin  Nondepolarizing muscle relaxants for surgery  d-Tubocurarine (curare), pancuronium, vecuronium, atracurium  Beta-blocking agents  Propranolol, atenolol, metoprolol  Local anesthetics and related agents  Procaine,  Xylocaine in large amounts  Procainamide (for arrhythmias)  Botulinum toxin  Botox exacerbates weakness.  Quinine derivatives  Quinine, quinidine, chloroquine, mefloquine (Lariam)  Magnesium  Decreases ACh release  Penicillamine  May cause MG  Drugs with important interactions in MG  Cyclosporine  Broad range of drug interactions, which may raise or lower cyclosporine levels  Azathioprine  Avoid allopurinol; combination may result in myelosuppression.        Lesleigh Noe MD-PhD [facility] [phone] Available 7 PM to 7 AM, outside of these hours please call Neurologist on call as listed on Amion.

## 2022-11-11 NOTE — Telephone Encounter (Signed)
Recommend she go to the ER where she can be assessed for myasthenia exacerbation and be treated with IV medications, if needed.

## 2022-11-11 NOTE — Telephone Encounter (Signed)
[name] called in with some concerns with Heidi [name], patient is having worsening weakness in legs. Falling multiple times a day, now she has sciatic pain from a recent home. [name] is saying she doesn't feel comfortable with [name] being at the assisted living with how weak she is becoming. [name] is asking for advice on if she should make an appointment with [place] or take her to the hospital to really get to the root cause.

## 2022-11-11 NOTE — Consult Note (Signed)
Neurology Consultation Reason for Consult: c/f MG Flare Requesting Physician: Lacretia Leigh  CC: Increasing falls  History is obtained from: Patient and chart review  HPI: Heidi [name] is a 82 y.o. female with a past medical history significant for hypertension, hyperlipidemia, diabetes, obstructive sleep apnea on CPAP, hypothyroidism, tachy/bradycardia syndrome s/p pacemaker, CKD stage III, neuropathy, mild cognitive impairment, seronegative myasthenia gravis (follows with Dr. Posey Pronto, EMG diagnostic)  Her myasthenia was diagnosed after she developed symptoms of right eye ptosis, double vision predominantly on the lateral gaze, dysarthria, swallowing difficulty and leg weakness/falls.   AChR and MuSK antibody negative, thymoma negative, EMG consistent with myasthenia gravis (October 2023)   Patient reports symptoms improved with initiation of Mestinon and then transiently improved further with initiation of prednisone however she feels that the Mestinon was more helpful than the prednisone with prednisone losing efficacy after a few days.  Unfortunately she has not tolerated Mestinon well due to GI side effects of frequent diarrhea and abdominal cramping.  Therefore her dose of Mestinon was most recently decreased from 30/60/60 mg (9 AM, 2 PM, 5 PM) to 30/30 mg (10 AM and 4 PM).  Her prednisone was increased from 15 mg to 20 mg and she feels like she is having some swelling and rashes related to this as well as weight gain.  She has had some cough and congestion for the past 2 weeks which is actually been improving the last 2 days.  She has noted an increase in her myasthenia symptoms of worsening double vision and worsening falls although her swallowing and breathing have been stable.  She denies any new medications  She describes several example falls to me: 1) when getting up from the commode fell to the left on Saturday which was followed by development of severe right-sided sciatica the next day  2)  Earlier in the week when she had an episode while she was getting dressed sitting on her rollator went to pick something up slid and tumbled 3) while in the elevator she was trying to turn around to face the doors after getting in and suddenly fell.  Noting DNR paperwork at bedside we specifically discussed that at times with myasthenia temporary intubation is required and expected to be reversible with immunomodulatory treatment.  She indicates that in this case she would agree to intubation.  She does not want CPR.  She confirms her daughter would be her decision-maker if she is unable to make decisions for herself  ROS: All other review of systems was negative except as noted in the HPI.   Past Medical History:  Diagnosis Date   Anxiety    Atrial fibrillation (HCC)    Intermittent   CAD (coronary artery disease)    non obstructive   CKD (chronic kidney disease)    Diabetes mellitus without complication (HCC)    Dysrhythmia    PAF   GERD (gastroesophageal reflux disease)    Glaucoma    Narrow Angle   Heart murmur    HTN (hypertension)    Hyperlipidemia    Hypothyroid    Major depression    10-17-2010: hospitalized for suicidal, [facility], [place] (D/C 10-30-2010)   Migraine headache    OSA (obstructive sleep apnea)    CPAP nightly   Peripheral neuropathy    Presence of permanent cardiac pacemaker    Seasonal allergies    Sleep apnea    Tachy-brady syndrome ([place]) 04/06/2006   PPM placed   Past Surgical History:  Procedure Laterality Date   ABDOMINAL ADHESION SURGERY  2008   exploratory to remove attached to the abdominal wall, stomach and intestines.    ABDOMINAL HYSTERECTOMY  1976   APPENDECTOMY     BREAST EXCISIONAL BIOPSY     BREAST SURGERY     fibroids removed, benign   CARDIAC CATHETERIZATION  02-12-2006   Minor irregularities: RCA-mid 40%, LM-normal, LAD-normal. EF 65%.   CHOLECYSTECTOMY     ECTOPIC PREGNANCY SURGERY     treatment x3, last one  11-02-2009 at [facility]   goiter     goiter removed     MASS EXCISION Right 10/18/2015   Procedure: EXCISION OF 6CM MASS ON RIGHT  NECK/BACK;  Surgeon: [name], MD;  Location: [place];  Service: General;  Laterality: Right;   PACEMAKER GENERATOR CHANGE N/A 01/15/2012   Procedure: PACEMAKER GENERATOR CHANGE;  Surgeon: Deboraha Sprang, MD;  Location: [facility] CATH LAB;  Service: Cardiovascular;  Laterality: N/A;   PACEMAKER INSERTION     Permanent. [employer] EnRhyth 04/2006, set as DDDR   REVERSE SHOULDER ARTHROPLASTY Right 05/01/2022   Procedure: REVERSE SHOULDER ARTHROPLASTY;  Surgeon: Hiram Gash, MD;  Location: [facility];  Service: Orthopedics;  Laterality: Right;   TUMOR REMOVAL  2000   Fibroid from breast x4-most recent   WRIST FRACTURE SURGERY  11-2007   right, had metal plate inserted   Current Outpatient Medications  Medication Instructions   acetaminophen (TYLENOL) 650 mg, Oral, Every 6 hours PRN   cholestyramine (QUESTRAN) 4 g, Oral, Daily   cyanocobalamin 1,000 mcg, Oral, Daily   feeding supplement (ENSURE ENLIVE / ENSURE PLUS) LIQD 237 mLs, Oral, Every 24 hours   fenofibrate 160 mg, Oral, Daily, Take with food   ferrous sulfate 325 mg, Oral, Daily   fexofenadine-pseudoephedrine (ALLEGRA-D) 60-120 MG 12 hr tablet 1 tablet, Oral, 2 times daily   fluticasone (FLONASE) 50 MCG/ACT nasal spray 1 spray, Nasal, 2 times daily   furosemide (LASIX) 20 MG tablet TAKE 1 TABLET BY MOUTH 3 TIMES A WEEK.   gabapentin (NEURONTIN) 300 mg, Oral, 3 times daily,     galantamine (RAZADYNE ER) 8 mg, Oral, Daily   hydrOXYzine (ATARAX) 25 mg, Oral, Daily at bedtime   levothyroxine (SYNTHROID) 75 mcg, Oral, Daily,     melatonin 3 mg, Oral, Daily at bedtime   metFORMIN (GLUCOPHAGE) 500 mg, Oral, 2 times daily   methocarbamol (ROBAXIN) 500 mg, Oral, Every 8 hours PRN   metoprolol succinate (TOPROL-XL) 100 MG 24 hr tablet TAKE 1 TABLET(100 MG) BY MOUTH DAILY   Multiple  Vitamins-Minerals (ADULT GUMMY PO) 1 capsule, Oral, Daily   niacin (VITAMIN B3) 100 mg, Oral, Daily at bedtime   nortriptyline (PAMELOR) 100 mg, Oral, Daily at bedtime   pantoprazole (PROTONIX) 40 mg, Oral, 2 times daily   predniSONE (DELTASONE) 20 mg, Oral, Daily with breakfast   PRESCRIPTION MEDICATION Inhalation, Daily at bedtime, CPAP   pyridostigmine (MESTINON) 60 MG tablet Take half-tablet at 10a and 4pm.   sodium chloride (OCEAN) 0.65 % SOLN nasal spray 1 spray, Each Nare, Every 2 hours PRN   spironolactone (ALDACTONE) 25 mg, Oral, Daily   Vitamin D-3 5,000 Units, Oral, Daily     Family History  Problem Relation Age of Onset   Stomach cancer Father    Atrial fibrillation Brother     Social History:  reports that she has quit smoking. She has never used smokeless tobacco. She reports that she does not drink alcohol and does  not use drugs.   Exam: Current vital signs: BP (!) 143/83 (BP Location: Right Arm)   Pulse 86   Temp 98.6 F (37 C) (Oral)   Resp 16   Ht '5\' 1"'$  (1.549 m)   Wt 69.9 kg   SpO2 98%   BMI 29.10 kg/m  Vital signs in last 24 hours: Temp:  [98.4 F (36.9 C)-98.6 F (37 C)] 98.6 F (37 C) (02/05 2043) Pulse Rate:  [86-99] 86 (02/05 2211) Resp:  [15-16] 16 (02/05 2211) BP: (132-145)/(71-83) 143/83 (02/05 2211) SpO2:  [96 %-98 %] 98 % (02/05 2211) Weight:  [69.9 kg] 69.9 kg (02/05 1600)   Physical Exam  Constitutional: Appears well-developed and well-nourished.  Very uncomfortable in the bed due to her right-sided sciatica for which she has just received morphine prior to my evaluation Psych: Affect appropriate to situation, pleasant and cooperative, mildly anxious Eyes: No scleral injection HENT: No oropharyngeal obstruction.  MSK: no joint deformities.  Cardiovascular: Perfusing extremities well Respiratory: Effort normal, non-labored breathing.  single breath test 21 GI: Soft.  No distension. There is no tenderness.  Skin: Warm dry and intact  visible skin  Neuro: Mental Status: Patient is awake, alert, oriented to person, place, month, year, and situation. Patient is able to give a clear and coherent history. No signs of aphasia or neglect Fluent speech, speaking in full sentences Cranial Nerves: II: Visual Fields are full. Pupils are equal, round, and reactive to light.   III,IV, VI: No diplopia on sustained upgaze but she does start to have right eye worsening ptosis within 2 minutes.  She does have diplopia on lateral gaze bilaterally. V: Facial sensation is symmetric to temperature VII: Facial movement is symmetric.  VIII: hearing is intact to voice X: Uvula elevates symmetrically XI: Shoulder shrug is symmetric. XII: tongue is midline without atrophy or fasciculations.  Motor: Tone is normal. Bulk is normal.  Mild deltoid weakness 4+/5 bilaterally.  Right leg motor testing deferred by patient secondary to sciatica.  Left leg testing with 4+/5 hip flexor strength otherwise 5/5.  Neck flexion 4-/5 Sensory: Sensation is symmetric to light touch and temperature in the arms and legs. Deep Tendon Reflexes: 2+ and symmetric in the brachioradialis and patellae.  Cerebellar: Finger-nose is intact bilaterally Gait:  Deferred in acute setting    I have reviewed labs in epic and the results pertinent to this consultation are:  Basic Metabolic Panel: Recent Labs  Lab 11/11/22 1627  NA 137  K 4.3  CL 100  CO2 24  GLUCOSE 304*  BUN 16  CREATININE 1.25*  CALCIUM 10.1    CBC: Recent Labs  Lab 11/11/22 1627  WBC 9.8  HGB 14.8  HCT 45.7  MCV 90.9  PLT 257    Coagulation Studies: No results for input(s): "LABPROT", "INR" in the last 72 hours.    I have reviewed the images obtained:  CT head personally reviewed, agree with radiology:  1. No acute intracranial abnormality. 2. Generalized cerebral atrophy and microvascular disease changes of the supratentorial brain.  CT L-spine personally reviewed, agree  with radiology:   1. No acute osseous injury within the lumbar spine. 2. 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 3. 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  Impression: 82 year old woman with past medical history as detailed above presenting with worsening myasthenia gravis symptoms of increased double vision and bilateral lower extremity weakness.  Potential trigger of recent upper  respiratory infection which is now improving.  Laboratory evaluation additionally noted for AKI on CKD.  Given her difficulty tolerating Mestinon and worsening symptoms despite increase of prednisone I do think it is reasonable to admit her for acute inpatient treatment for exacerbation to reduce the risk of further falls and injury  Recommendations: -Admit, neurology will follow along in consultation -Every 8 hour NIF and vital capacity testing -RVP to assess for etiology of exacerbation -IVIG 2 g/kg divided over 5 days, may be adjusted 4 hours earlier daily (q20 hours) to facilitate earlier discharge.  -500 cc Normal saline pre and post for renal protection -Avoid meds listed below  Drugs to avoid if you have Myasthenia Gravis:   Many drugs have been reported to have adverse effects in patients with MG (see below). However, not all patients react adversely to all these drugs. Conversely, not all "safe" drugs can be used with impunity in patients with MG.As a rule, the listed drugs should be avoided whenever possible, and patients with MG should be followed closely when any new drug is introduced.  Drugs that may exacerbate MG  Antibiotics  Aminoglycosides: e.g., streptomycin, tobramycin, kanamycin  Quinolones: e.g., ciprofloxacin, levofloxacin, ofloxacin, gatifloxacin  Macrolides: e.g., erythromycin, azithromycin, telithromycin  Nondepolarizing muscle relaxants for surgery  d-Tubocurarine (curare), pancuronium,  vecuronium, atracurium  Beta-blocking agents  Propranolol, atenolol, metoprolol  Local anesthetics and related agents  Procaine, Xylocaine in large amounts  Procainamide (for arrhythmias)  Botulinum toxin  Botox exacerbates weakness.  Quinine derivatives  Quinine, quinidine, chloroquine, mefloquine (Lariam)  Magnesium  Decreases ACh release  Penicillamine  May cause MG  Drugs with important interactions in MG  Cyclosporine  Broad range of drug interactions, which may raise or lower cyclosporine levels  Azathioprine  Avoid allopurinol; combination may result in myelosuppression.    Lesleigh Noe MD-PhD [facility] [phone] Available 7 PM to 7 AM, outside of these hours please call Neurologist on call as listed on Amion.

## 2022-11-11 NOTE — Telephone Encounter (Signed)
Called and spoke to patients daughter. Informed her of Dr. Serita Grit advice of Recommending she go to the ER where she can be assessed for myasthenia exacerbation and be treated with IV medications, if needed.  Patients daughter was agreeable to this and will take her mother to [facility]. Patients daughter will update [place] on what happens and how patient is doing.

## 2022-11-12 DIAGNOSIS — N179 Acute kidney failure, unspecified: Secondary | ICD-10-CM | POA: Diagnosis present

## 2022-11-12 DIAGNOSIS — I2510 Atherosclerotic heart disease of native coronary artery without angina pectoris: Secondary | ICD-10-CM | POA: Diagnosis present

## 2022-11-12 DIAGNOSIS — Z7952 Long term (current) use of systemic steroids: Secondary | ICD-10-CM | POA: Diagnosis not present

## 2022-11-12 DIAGNOSIS — G7001 Myasthenia gravis with (acute) exacerbation: Secondary | ICD-10-CM | POA: Diagnosis present

## 2022-11-12 DIAGNOSIS — Z880 Allergy status to penicillin: Secondary | ICD-10-CM | POA: Diagnosis not present

## 2022-11-12 DIAGNOSIS — I480 Paroxysmal atrial fibrillation: Secondary | ICD-10-CM | POA: Diagnosis present

## 2022-11-12 DIAGNOSIS — E1140 Type 2 diabetes mellitus with diabetic neuropathy, unspecified: Secondary | ICD-10-CM | POA: Diagnosis present

## 2022-11-12 DIAGNOSIS — Z882 Allergy status to sulfonamides status: Secondary | ICD-10-CM | POA: Diagnosis not present

## 2022-11-12 DIAGNOSIS — E785 Hyperlipidemia, unspecified: Secondary | ICD-10-CM | POA: Diagnosis present

## 2022-11-12 DIAGNOSIS — F329 Major depressive disorder, single episode, unspecified: Secondary | ICD-10-CM | POA: Diagnosis present

## 2022-11-12 DIAGNOSIS — Z66 Do not resuscitate: Secondary | ICD-10-CM | POA: Diagnosis present

## 2022-11-12 DIAGNOSIS — N1831 Chronic kidney disease, stage 3a: Secondary | ICD-10-CM

## 2022-11-12 DIAGNOSIS — I130 Hypertensive heart and chronic kidney disease with heart failure and stage 1 through stage 4 chronic kidney disease, or unspecified chronic kidney disease: Secondary | ICD-10-CM | POA: Diagnosis present

## 2022-11-12 DIAGNOSIS — I10 Essential (primary) hypertension: Secondary | ICD-10-CM

## 2022-11-12 DIAGNOSIS — Z888 Allergy status to other drugs, medicaments and biological substances status: Secondary | ICD-10-CM | POA: Diagnosis not present

## 2022-11-12 DIAGNOSIS — Z1152 Encounter for screening for COVID-19: Secondary | ICD-10-CM | POA: Diagnosis not present

## 2022-11-12 DIAGNOSIS — E1142 Type 2 diabetes mellitus with diabetic polyneuropathy: Secondary | ICD-10-CM | POA: Diagnosis present

## 2022-11-12 DIAGNOSIS — E039 Hypothyroidism, unspecified: Secondary | ICD-10-CM | POA: Diagnosis present

## 2022-11-12 DIAGNOSIS — E1121 Type 2 diabetes mellitus with diabetic nephropathy: Secondary | ICD-10-CM

## 2022-11-12 DIAGNOSIS — Z7984 Long term (current) use of oral hypoglycemic drugs: Secondary | ICD-10-CM | POA: Diagnosis not present

## 2022-11-12 DIAGNOSIS — Z79890 Hormone replacement therapy (postmenopausal): Secondary | ICD-10-CM | POA: Diagnosis not present

## 2022-11-12 DIAGNOSIS — Z87891 Personal history of nicotine dependence: Secondary | ICD-10-CM | POA: Diagnosis not present

## 2022-11-12 DIAGNOSIS — E1122 Type 2 diabetes mellitus with diabetic chronic kidney disease: Secondary | ICD-10-CM | POA: Diagnosis present

## 2022-11-12 DIAGNOSIS — Z79899 Other long term (current) drug therapy: Secondary | ICD-10-CM | POA: Diagnosis not present

## 2022-11-12 DIAGNOSIS — G7000 Myasthenia gravis without (acute) exacerbation: Secondary | ICD-10-CM | POA: Diagnosis present

## 2022-11-12 DIAGNOSIS — I509 Heart failure, unspecified: Secondary | ICD-10-CM | POA: Diagnosis present

## 2022-11-12 DIAGNOSIS — R296 Repeated falls: Secondary | ICD-10-CM | POA: Diagnosis present

## 2022-11-12 LAB — RESPIRATORY PANEL BY PCR

## 2022-11-12 LAB — CBC
HCT: 43.5 % (ref 36.0–46.0)
Hemoglobin: 14.3 g/dL (ref 12.0–15.0)
MCH: 29.9 pg (ref 26.0–34.0)
MCHC: 32.9 g/dL (ref 30.0–36.0)
MCV: 90.8 fL (ref 80.0–100.0)
Platelets: 234 10*3/uL (ref 150–400)
RBC: 4.79 MIL/uL (ref 3.87–5.11)
RDW: 14.1 % (ref 11.5–15.5)
WBC: 11.3 10*3/uL — ABNORMAL HIGH (ref 4.0–10.5)
nRBC: 0 % (ref 0.0–0.2)

## 2022-11-12 LAB — BASIC METABOLIC PANEL
Anion gap: 9 (ref 5–15)
BUN: 16 mg/dL (ref 8–23)
CO2: 31 mmol/L (ref 22–32)
Calcium: 10.1 mg/dL (ref 8.9–10.3)
Chloride: 100 mmol/L (ref 98–111)
Creatinine, Ser: 1.01 mg/dL — ABNORMAL HIGH (ref 0.44–1.00)
GFR, Estimated: 56 mL/min — ABNORMAL LOW (ref >60)
Glucose, Bld: 127 mg/dL — ABNORMAL HIGH (ref 70–99)
Potassium: 3.8 mmol/L (ref 3.5–5.1)
Sodium: 140 mmol/L (ref 135–145)

## 2022-11-12 LAB — URINALYSIS, ROUTINE W REFLEX MICROSCOPIC
Bilirubin Urine: NEGATIVE
Glucose, UA: NEGATIVE mg/dL
Hgb urine dipstick: NEGATIVE
Ketones, ur: NEGATIVE mg/dL
Leukocytes,Ua: NEGATIVE
Nitrite: NEGATIVE
Protein, ur: NEGATIVE mg/dL
Specific Gravity, Urine: 1.012 (ref 1.005–1.030)
pH: 6 (ref 5.0–8.0)

## 2022-11-12 LAB — CBG MONITORING, ED
Glucose-Capillary: 120 mg/dL — ABNORMAL HIGH (ref 70–99)
Glucose-Capillary: 116 mg/dL — ABNORMAL HIGH (ref 70–99)
Glucose-Capillary: 177 mg/dL — ABNORMAL HIGH (ref 70–99)

## 2022-11-12 LAB — GLUCOSE, CAPILLARY
Glucose-Capillary: 181 mg/dL — ABNORMAL HIGH (ref 70–99)
Glucose-Capillary: 141 mg/dL — ABNORMAL HIGH (ref 70–99)

## 2022-11-12 MED ORDER — SODIUM CHLORIDE 0.9 % IV BOLUS
500.0000 mL | INTRAVENOUS | Status: AC
  Administered 2022-11-13 – 2022-11-15 (×3): 500 mL via INTRAVENOUS

## 2022-11-12 MED ORDER — ASPIRIN 81 MG PO CHEW
81.0000 mg | CHEWABLE_TABLET | Freq: Every day | ORAL | Status: DC
  Administered 2022-11-12 – 2022-11-16 (×5): 81 mg via ORAL
  Filled 2022-11-12 (×5): qty 1

## 2022-11-12 MED ORDER — ONDANSETRON HCL 4 MG PO TABS: 4.0000 mg | ORAL_TABLET | Freq: Four times a day (QID) | ORAL | Status: DC | PRN

## 2022-11-12 MED ORDER — HYDROXYZINE HCL 25 MG PO TABS
25.0000 mg | ORAL_TABLET | Freq: Every day | ORAL | Status: DC
  Administered 2022-11-12 – 2022-11-15 (×5): 25 mg via ORAL
  Filled 2022-11-12 (×8): qty 1

## 2022-11-12 MED ORDER — ACETAMINOPHEN 325 MG PO TABS
650.0000 mg | ORAL_TABLET | Freq: Four times a day (QID) | ORAL | Status: DC | PRN
  Administered 2022-11-12 – 2022-11-15 (×8): 650 mg via ORAL
  Filled 2022-11-12 (×8): qty 2

## 2022-11-12 MED ORDER — PANTOPRAZOLE SODIUM 40 MG PO TBEC
40.0000 mg | DELAYED_RELEASE_TABLET | Freq: Two times a day (BID) | ORAL | Status: DC
  Administered 2022-11-12 – 2022-11-16 (×9): 40 mg via ORAL
  Filled 2022-11-12 (×9): qty 1

## 2022-11-12 MED ORDER — METOPROLOL SUCCINATE ER 50 MG PO TB24
100.0000 mg | ORAL_TABLET | Freq: Every day | ORAL | Status: DC
  Administered 2022-11-12 – 2022-11-16 (×5): 100 mg via ORAL
  Filled 2022-11-12: qty 2
  Filled 2022-11-12: qty 1
  Filled 2022-11-12 (×3): qty 2

## 2022-11-12 MED ORDER — ONDANSETRON HCL 4 MG/2ML IJ SOLN: 4.0000 mg | Freq: Four times a day (QID) | INTRAMUSCULAR | Status: DC | PRN

## 2022-11-12 MED ORDER — SODIUM CHLORIDE 0.9 % IV BOLUS
500.0000 mL | INTRAVENOUS | Status: DC
  Administered 2022-11-12: 500 mL via INTRAVENOUS

## 2022-11-12 MED ORDER — GALANTAMINE HYDROBROMIDE ER 8 MG PO CP24
8.0000 mg | ORAL_CAPSULE | Freq: Every day | ORAL | Status: DC
  Administered 2022-11-16: 8 mg via ORAL
  Filled 2022-11-12 (×5): qty 1

## 2022-11-12 MED ORDER — IMMUNE GLOBULIN (HUMAN) 10 GM/100ML IV SOLN
400.0000 mg/kg | INTRAVENOUS | Status: AC
  Administered 2022-11-13 – 2022-11-16 (×4): 25 g via INTRAVENOUS
  Filled 2022-11-12 (×4): qty 250

## 2022-11-12 MED ORDER — CHOLESTYRAMINE 4 G PO PACK
4.0000 g | PACK | Freq: Every day | ORAL | Status: DC
  Administered 2022-11-14 – 2022-11-16 (×3): 4 g via ORAL
  Filled 2022-11-12 (×6): qty 1

## 2022-11-12 MED ORDER — ENOXAPARIN SODIUM 40 MG/0.4ML IJ SOSY
40.0000 mg | PREFILLED_SYRINGE | INTRAMUSCULAR | Status: DC
  Administered 2022-11-12 – 2022-11-16 (×5): 40 mg via SUBCUTANEOUS
  Filled 2022-11-12 (×5): qty 0.4

## 2022-11-12 MED ORDER — FENOFIBRATE 160 MG PO TABS
160.0000 mg | ORAL_TABLET | Freq: Every day | ORAL | Status: DC
  Administered 2022-11-13 – 2022-11-16 (×4): 160 mg via ORAL
  Filled 2022-11-12 (×5): qty 1

## 2022-11-12 MED ORDER — HYDRALAZINE HCL 20 MG/ML IJ SOLN
5.0000 mg | INTRAMUSCULAR | Status: DC | PRN
  Administered 2022-11-16: 10 mg via INTRAVENOUS
  Filled 2022-11-12: qty 1

## 2022-11-12 MED ORDER — LEVOTHYROXINE SODIUM 75 MCG PO TABS
75.0000 ug | ORAL_TABLET | Freq: Every day | ORAL | Status: DC
  Administered 2022-11-12 – 2022-11-16 (×5): 75 ug via ORAL
  Filled 2022-11-12 (×5): qty 1

## 2022-11-12 MED ORDER — ACETAMINOPHEN 650 MG RE SUPP: 650.0000 mg | Freq: Four times a day (QID) | RECTAL | Status: DC | PRN

## 2022-11-12 MED ORDER — SPIRONOLACTONE 25 MG PO TABS
25.0000 mg | ORAL_TABLET | Freq: Every day | ORAL | Status: DC
  Administered 2022-11-12 – 2022-11-15 (×5): 25 mg via ORAL
  Filled 2022-11-12: qty 1
  Filled 2022-11-12: qty 2
  Filled 2022-11-12 (×3): qty 1

## 2022-11-12 MED ORDER — NORTRIPTYLINE HCL 25 MG PO CAPS
100.0000 mg | ORAL_CAPSULE | Freq: Every day | ORAL | Status: DC
  Administered 2022-11-12 – 2022-11-15 (×5): 100 mg via ORAL
  Filled 2022-11-12 (×6): qty 4

## 2022-11-12 MED ORDER — MELATONIN 3 MG PO TABS
3.0000 mg | ORAL_TABLET | Freq: Every day | ORAL | Status: DC
  Administered 2022-11-12 – 2022-11-15 (×5): 3 mg via ORAL
  Filled 2022-11-12 (×5): qty 1

## 2022-11-12 MED ORDER — INSULIN ASPART 100 UNIT/ML IJ SOLN
0.0000 [IU] | Freq: Three times a day (TID) | INTRAMUSCULAR | Status: DC
  Administered 2022-11-12 – 2022-11-13 (×3): 3 [IU] via SUBCUTANEOUS
  Administered 2022-11-13: 5 [IU] via SUBCUTANEOUS
  Administered 2022-11-14: 3 [IU] via SUBCUTANEOUS
  Administered 2022-11-14 – 2022-11-15 (×2): 5 [IU] via SUBCUTANEOUS
  Administered 2022-11-15: 3 [IU] via SUBCUTANEOUS
  Administered 2022-11-16: 5 [IU] via SUBCUTANEOUS

## 2022-11-12 MED ORDER — GABAPENTIN 300 MG PO CAPS
300.0000 mg | ORAL_CAPSULE | Freq: Three times a day (TID) | ORAL | Status: DC
  Administered 2022-11-12 – 2022-11-16 (×14): 300 mg via ORAL
  Filled 2022-11-12 (×14): qty 1

## 2022-11-12 MED ORDER — FERROUS SULFATE 325 (65 FE) MG PO TABS
325.0000 mg | ORAL_TABLET | Freq: Every day | ORAL | Status: DC
  Administered 2022-11-12 – 2022-11-16 (×5): 325 mg via ORAL
  Filled 2022-11-12 (×5): qty 1

## 2022-11-12 MED ORDER — SALINE SPRAY 0.65 % NA SOLN: 1.0000 | NASAL | Status: DC | PRN

## 2022-11-12 MED ORDER — OXYCODONE HCL 5 MG PO TABS
2.5000 mg | ORAL_TABLET | Freq: Two times a day (BID) | ORAL | Status: DC | PRN
  Administered 2022-11-12 – 2022-11-13 (×2): 2.5 mg via ORAL
  Filled 2022-11-12 (×2): qty 1

## 2022-11-12 MED ORDER — IMMUNE GLOBULIN (HUMAN) 10 GM/100ML IV SOLN
400.0000 mg/kg | INTRAVENOUS | Status: DC
  Administered 2022-11-12: 25 g via INTRAVENOUS
  Filled 2022-11-12 (×2): qty 250

## 2022-11-12 MED ORDER — PYRIDOSTIGMINE BROMIDE 60 MG PO TABS
30.0000 mg | ORAL_TABLET | Freq: Two times a day (BID) | ORAL | Status: DC
  Administered 2022-11-12 – 2022-11-16 (×9): 30 mg via ORAL
  Filled 2022-11-12 (×11): qty 0.5

## 2022-11-12 MED ORDER — SODIUM CHLORIDE 0.9 % IV BOLUS
500.0000 mL | INTRAVENOUS | Status: AC
  Administered 2022-11-13 – 2022-11-16 (×4): 500 mL via INTRAVENOUS

## 2022-11-12 MED ORDER — PREDNISONE 20 MG PO TABS
20.0000 mg | ORAL_TABLET | Freq: Every day | ORAL | Status: DC
  Administered 2022-11-12 – 2022-11-16 (×5): 20 mg via ORAL
  Filled 2022-11-12 (×5): qty 1

## 2022-11-12 NOTE — ED Notes (Signed)
MD at bedside. 

## 2022-11-12 NOTE — ED Notes (Signed)
Pt used the bedpan to void urine, pt requested to sit on side of bed to eat breakfast

## 2022-11-12 NOTE — Progress Notes (Signed)
NIF -40 VC 1.3L  With great effort

## 2022-11-12 NOTE — Assessment & Plan Note (Signed)
Creat 1.2 today just slightly above her baseline of 1.0. Monitor daily BMP

## 2022-11-12 NOTE — Hospital Course (Addendum)
82 y.o. 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 She had no fevers, nausea, vomiting,has been having some diarrhea from mestinone.  No urinary symptoms. Currently on prednisone and mestinone. Seen in the ED, hemodynamically stable on room air afebrile Labs with creatinine 1.2 otherwise fairly stable CBC and BMP UA negative for UTI, respiratory virus panel negative underwent CT head and CT lumbar spine-multifactorial DJD no acute finding,, Generalized cerebral atrophy.  Neurology was consulted admitted for myasthenia crisis-started  on IVIG dose close 6/24

## 2022-11-12 NOTE — ED Notes (Signed)
Pt placed on bedpan and ready for room upstairs, belongings on bed transport called

## 2022-11-12 NOTE — ED Notes (Signed)
ED TO INPATIENT HANDOFF REPORT  ED Nurse Name and Phone #: Vincente Liberty [phone]  S Name/Age/Gender Heidi [name] 82 y.o. female Room/Bed: 038C/038C  Code Status   Code Status: DNR  Home/SNF/Other Skilled nursing facility Patient oriented to: self, place, time, and situation Is this baseline? Yes   Triage Complete: Triage complete  Chief Complaint Myasthenic crisis [facility]) [G70.01]  Triage Note Pt BIB EMS for back pain and multiple falls in the last 5 days. Pt endorses intermittent dizziness. Pt has myasthenia gravis.    140/80 HR 100 CBG 319   Allergies Allergies  Allergen Reactions   Crestor [Rosuvastatin Calcium] Other (See Comments)    Caused Myalgias   Effexor [Venlafaxine Hydrochloride] Other (See Comments) and Hypertension    Caused BP to go up   Aricept [Donepezil]     Other reaction(s): diarrhea   Latex     Other reaction(s): rash   Milk Protein Hives   Nardil [Phenelzine]     Other reaction(s): HTN   Pork-Derived Products    Hibiclens [Chlorhexidine Gluconate] Hives   Lipitor [Atorvastatin Calcium] Rash   Lisinopril Rash    Other reaction(s): rash   Penicillins Swelling and Other (See Comments)    Tolerated Cephalosporin Date: 05/01/22     Plavix [Clopidogrel Bisulfate] Rash   Simvastatin Rash    Other reaction(s): rash   Sulfa Antibiotics Rash   Sulfonamide Derivatives Rash   Tape Rash    Other reaction(s): rash    Level of Care/Admitting Diagnosis ED Disposition     ED Disposition  Admit   Condition  --   [place]: [place] [[id number]]  Level of Care: Progressive [102]  Admit to Progressive based on following criteria: NEUROLOGICAL AND NEUROSURGICAL complex patients with significant risk of instability, who do not meet ICU criteria, yet require close observation or frequent assessment (< / = every 2 - 4 hours) with medical / nursing intervention.  May admit patient to [facility] or [facility] if equivalent  level of care is available:: No  Covid Evaluation: Asymptomatic - no recent exposure (last 10 days) testing not required  Diagnosis: Myasthenic crisis [facility]) [[id number]]  Admitting Physician: [name] Marquez  Attending Physician: Antonieta Pert [[id number]]  Certification:: I certify this patient will need inpatient services for at least 2 midnights  Estimated Length of Stay: 2          B Medical/Surgery History Past Medical History:  Diagnosis Date   Anxiety    Atrial fibrillation ([place])    Intermittent   CAD (coronary artery disease)    non obstructive   CKD (chronic kidney disease)    Diabetes mellitus without complication ([place])    Dysrhythmia    PAF   GERD (gastroesophageal reflux disease)    Glaucoma    Narrow Angle   Heart murmur    HTN (hypertension)    Hyperlipidemia    Hypothyroid    Major depression    10-17-2010: hospitalized for suicidal, [facility]ly, [place] (D/C 10-30-2010)   Migraine headache    OSA (obstructive sleep apnea)    CPAP nightly   Peripheral neuropathy    Presence of permanent cardiac pacemaker    Seasonal allergies    Sleep apnea    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  ABDOMINAL HYSTERECTOMY  1976   APPENDECTOMY     BREAST EXCISIONAL BIOPSY     BREAST SURGERY     fibroids removed, benign   CARDIAC CATHETERIZATION  02-12-2006   Minor irregularities: RCA-mid 40%, LM-normal, LAD-normal. EF 65%.   CHOLECYSTECTOMY     ECTOPIC PREGNANCY SURGERY     treatment x3, last one 11-02-2009 at [facility]   goiter     goiter removed     MASS EXCISION Right 10/18/2015   Procedure: EXCISION OF 6CM MASS ON RIGHT  NECK/BACK;  Surgeon: [name], MD;  Location: [place];  Service: General;  Laterality: Right;   PACEMAKER GENERATOR  CHANGE N/A 01/15/2012   Procedure: PACEMAKER GENERATOR CHANGE;  Surgeon: Deboraha Sprang, MD;  Location: [facility];  Service: Cardiovascular;  Laterality: N/A;   PACEMAKER INSERTION     Permanent. [employer] EnRhyth 04/2006, set as DDDR   REVERSE SHOULDER ARTHROPLASTY Right 05/01/2022   Procedure: REVERSE SHOULDER ARTHROPLASTY;  Surgeon: Hiram Gash, MD;  Location: [facility];  Service: Orthopedics;  Laterality: Right;   TUMOR REMOVAL  2000   Fibroid from breast x4-most recent   WRIST FRACTURE SURGERY  11-2007   right, had metal plate inserted     A IV Location/Drains/Wounds Patient Lines/Drains/Airways Status     Active Line/Drains/Airways     Name Placement date Placement time Site Days   Peripheral IV 11/11/22 20 G Anterior;Right Forearm 11/11/22  2206  Forearm  1   External Urinary Catheter 11/12/22  0356  --  less than 1   Incision (Closed) 05/01/22 Shoulder Right 05/01/22  0836  -- 195            Intake/Output Last 24 hours  Intake/Output Summary (Last 24 hours) at 11/12/2022 1249 Last data filed at 11/12/2022 0744 Gross per 24 hour  Intake 1250 ml  Output --  Net 1250 ml    Labs/Imaging Results for orders placed or performed during the hospital encounter of 11/11/22 (from the past 48 hour(s))  Urinalysis, Routine w reflex microscopic -Urine, Clean Catch     Status: None   Collection Time: 11/11/22  8:21 AM  Result Value Ref Range   Color, Urine YELLOW YELLOW   APPearance CLEAR CLEAR   Specific Gravity, Urine 1.012 1.005 - 1.030   pH 6.0 5.0 - 8.0   Glucose, UA NEGATIVE NEGATIVE mg/dL   Hgb urine dipstick NEGATIVE NEGATIVE   Bilirubin Urine NEGATIVE NEGATIVE   Ketones, ur NEGATIVE NEGATIVE mg/dL   Protein, ur NEGATIVE NEGATIVE mg/dL   Nitrite NEGATIVE NEGATIVE   Leukocytes,Ua NEGATIVE NEGATIVE    Comment: Performed at [facility] [address], [place], [place] [address]  Basic metabolic panel     Status: Abnormal   Collection Time: 11/11/22  4:27 PM   Result Value Ref Range   Sodium 137 135 - 145 mmol/L   Potassium 4.3 3.5 - 5.1 mmol/L   Chloride 100 98 - 111 mmol/L   CO2 24 22 - 32 mmol/L   Glucose, Bld 304 (H) 70 - 99 mg/dL    Comment: Glucose reference range applies only to samples taken after fasting for at least 8 hours.   BUN 16 8 - 23 mg/dL   Creatinine, Ser 1.25 (H) 0.44 - 1.00 mg/dL   Calcium 10.1 8.9 - 10.3 mg/dL   GFR, Estimated 43 (L) >60 mL/min    Comment: (NOTE) Calculated using the CKD-EPI Creatinine Equation (2021)    Anion gap  13 5 - 15    Comment: Performed at [facility], [place] [address] [place] [address]  CBC     Status: None   Collection Time: 11/11/22  4:27 PM  Result Value Ref Range   WBC 9.8 4.0 - 10.5 K/uL   RBC 5.03 3.87 - 5.11 MIL/uL   Hemoglobin 14.8 12.0 - 15.0 g/dL   HCT 45.7 36.0 - 46.0 %   MCV 90.9 80.0 - 100.0 fL   MCH 29.4 26.0 - 34.0 pg   MCHC 32.4 30.0 - 36.0 g/dL   RDW 14.1 11.5 - 15.5 %   Platelets 257 150 - 400 K/uL   nRBC 0.0 0.0 - 0.2 %    Comment: Performed at [facility], [place] [address], [place], [place] [address]  Respiratory (~20 pathogens) panel by PCR     Status: None   Collection Time: 11/12/22 12:10 AM   Specimen: Nasopharyngeal Swab; Respiratory  Result Value Ref Range   Adenovirus NOT DETECTED NOT DETECTED   Coronavirus 229E NOT DETECTED NOT DETECTED    Comment: (NOTE) The Coronavirus on the Respiratory Panel, DOES NOT test for the novel  Coronavirus (2019 nCoV)    Coronavirus HKU1 NOT DETECTED NOT DETECTED   Coronavirus NL63 NOT DETECTED NOT DETECTED   Coronavirus OC43 NOT DETECTED NOT DETECTED   Metapneumovirus NOT DETECTED NOT DETECTED   Rhinovirus / Enterovirus NOT DETECTED NOT DETECTED   Influenza A NOT DETECTED NOT DETECTED   Influenza B NOT DETECTED NOT DETECTED   Parainfluenza Virus 1 NOT DETECTED NOT DETECTED   Parainfluenza Virus 2 NOT DETECTED NOT DETECTED   Parainfluenza Virus 3 NOT DETECTED NOT DETECTED   Parainfluenza Virus 4  NOT DETECTED NOT DETECTED   Respiratory Syncytial Virus NOT DETECTED NOT DETECTED   Bordetella pertussis NOT DETECTED NOT DETECTED   Bordetella Parapertussis NOT DETECTED NOT DETECTED   Chlamydophila pneumoniae NOT DETECTED NOT DETECTED   Mycoplasma pneumoniae NOT DETECTED NOT DETECTED    Comment: Performed at [facility], [place] [address], [place], [place] [address]  CBG monitoring, ED     Status: Abnormal   Collection Time: 11/12/22  1:58 AM  Result Value Ref Range   Glucose-Capillary 120 (H) 70 - 99 mg/dL    Comment: Glucose reference range applies only to samples taken after fasting for at least 8 hours.  CBC     Status: Abnormal   Collection Time: 11/12/22  1:59 AM  Result Value Ref Range   WBC 11.3 (H) 4.0 - 10.5 K/uL   RBC 4.79 3.87 - 5.11 MIL/uL   Hemoglobin 14.3 12.0 - 15.0 g/dL   HCT 43.5 36.0 - 46.0 %   MCV 90.8 80.0 - 100.0 fL   MCH 29.9 26.0 - 34.0 pg   MCHC 32.9 30.0 - 36.0 g/dL   RDW 14.1 11.5 - 15.5 %   Platelets 234 150 - 400 K/uL   nRBC 0.0 0.0 - 0.2 %    Comment: Performed at [facility], [place] [address], [place], [place] [address]  Basic metabolic panel     Status: Abnormal   Collection Time: 11/12/22  1:59 AM  Result Value Ref Range   Sodium 140 135 - 145 mmol/L   Potassium 3.8 3.5 - 5.1 mmol/L   Chloride 100 98 - 111 mmol/L   CO2 31 22 - 32 mmol/L   Glucose, Bld 127 (H) 70 - 99 mg/dL    Comment: Glucose reference range  applies only to samples taken after fasting for at least 8 hours.   BUN 16 8 - 23 mg/dL   Creatinine, Ser 1.01 (H) 0.44 - 1.00 mg/dL   Calcium 10.1 8.9 - 10.3 mg/dL   GFR, Estimated 56 (L) >60 mL/min    Comment: (NOTE) Calculated using the CKD-EPI Creatinine Equation (2021)    Anion gap 9 5 - 15    Comment: Performed at [facility] [address] [place], [place] [address]  CBG monitoring, ED     Status: Abnormal   Collection Time: 11/12/22  7:57 AM  Result Value Ref Range   Glucose-Capillary 116 (H) 70 - 99 mg/dL     Comment: Glucose reference range applies only to samples taken after fasting for at least 8 hours.  CBG monitoring, ED     Status: Abnormal   Collection Time: 11/12/22 11:42 AM  Result Value Ref Range   Glucose-Capillary 177 (H) 70 - 99 mg/dL    Comment: Glucose reference range applies only to samples taken after fasting for at least 8 hours.   CT Lumbar Spine Wo Contrast  Result Date: 11/11/2022 CLINICAL DATA:  Initial evaluation for acute trauma.  Pain. EXAM: CT LUMBAR SPINE WITHOUT CONTRAST TECHNIQUE: Multidetector CT imaging of the lumbar spine was performed without intravenous contrast administration. Multiplanar CT image reconstructions were also generated. RADIATION DOSE REDUCTION: This exam was performed according to the departmental dose-optimization program which includes automated exposure control, adjustment of the mA and/or kV according to patient size and/or use of iterative reconstruction technique. COMPARISON:  None available. FINDINGS: Segmentation: Standard. Lowest well-formed disc space labeled the L5-S1 level. Alignment: Moderate levoscoliosis, apex at L2-3. No significant listhesis. Vertebrae: Chronic endplate Schmorl's node deformity with mild height loss present at the T12 vertebral body. Otherwise, vertebral body height maintained without acute or chronic fracture. Visualized sacrum and pelvis intact. No worrisome osseous lesions. Paraspinal and other soft tissues: Paraspinous soft tissues demonstrate no acute finding. Moderate aorto bi-iliac atherosclerotic disease. Hepatic steatosis noted. Colonic diverticulosis noted as well. Disc levels: L1-2:  Mild disc bulge.  No canal or foraminal stenosis. L2-3: Advanced degenerative intervertebral disc space narrowing with disc desiccation and diffuse disc bulge, asymmetric to the right. Associated reactive endplate spurring. Moderate right worse than left facet hypertrophy. Resultant mild to moderate right lateral recess stenosis.  Central canal remains patent. Moderate right L2 foraminal narrowing. No significant left foraminal stenosis. L3-4: Diffuse disc bulge, asymmetric to the right. Moderate bilateral facet hypertrophy. Resultant mild spinal stenosis. Moderate right with mild left L3 foraminal narrowing. L4-5: Diffuse disc bulge. Superimposed broad left subarticular disc protrusion indents the ventral thecal sac. Moderate facet hypertrophy. Resultant moderate to severe canal with left worse than right lateral recess stenosis. Mild bilateral L4 foraminal narrowing. L5-S1: Mild disc bulge. Moderate facet hypertrophy. No canal or foraminal stenosis. IMPRESSION: 1. No acute osseous injury within the lumbar spine. 2. 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 3. 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 Aortic Atherosclerosis (ICD10-I70.0). / Electronically Signed   By: Jeannine Boga M.D.   On: 11/11/2022 22:19   CT Head Wo Contrast  Result Date: 11/11/2022 CLINICAL DATA:  Neurological deficit.  Evaluate for acute stroke. EXAM: CT HEAD WITHOUT CONTRAST TECHNIQUE: Contiguous axial images were obtained from the base of the skull through the vertex without intravenous contrast. RADIATION DOSE REDUCTION: This exam was performed according to the departmental dose-optimization program which  includes automated exposure control, adjustment of the mA and/or kV according to patient size and/or use of iterative reconstruction technique. COMPARISON:  May 29, 2022 FINDINGS: Brain: There is mild cerebral atrophy with widening of the extra-axial spaces and ventricular dilatation. There are areas of decreased attenuation within the white matter tracts of the supratentorial brain, consistent with microvascular disease changes. Vascular: There is moderate severity calcification of the bilateral cavernous carotid arteries. Skull: Normal. Negative for fracture  or focal lesion. Sinuses/Orbits: No acute finding. Other: None. IMPRESSION: 1. No acute intracranial abnormality. 2. Generalized cerebral atrophy and microvascular disease changes of the supratentorial brain. Electronically Signed   By: Virgina Norfolk M.D.   On: 11/11/2022 19:23    Pending Labs Unresulted Labs (From admission, onward)     Start     Ordered   11/13/22 1610  Basic metabolic panel  Daily,   R      11/12/22 1153   11/13/22 0500  CBC  Daily,   R      11/12/22 1153            Vitals/Pain Today's Vitals   11/12/22 0854 11/12/22 0900 11/12/22 1030 11/12/22 1115  BP: (!) 157/74 (!) 151/74 (!) 145/57 (!) 131/56  Pulse: 91 87 73 81  Resp: '15 13 15 19  '$ Temp:   98 F (36.7 C)   TempSrc:      SpO2: 97% 95% 94% 98%  Weight:      Height:      PainSc:        Isolation Precautions No active isolations  Medications Medications  Immune Globulin 10% (PRIVIGEN) IV infusion 25 g (0 g Intravenous Stopped 11/12/22 0638)  sodium chloride 0.9 % bolus 500 mL (0 mLs Intravenous Stopped 11/12/22 0352)    And  sodium chloride 0.9 % bolus 500 mL (0 mLs Intravenous Stopped 11/12/22 0744)  enoxaparin (LOVENOX) injection 40 mg (has no administration in time range)  ondansetron (ZOFRAN) tablet 4 mg (has no administration in time range)    Or  ondansetron (ZOFRAN) injection 4 mg (has no administration in time range)  acetaminophen (TYLENOL) tablet 650 mg (650 mg Oral Given 11/12/22 0153)    Or  acetaminophen (TYLENOL) suppository 650 mg ( Rectal See Alternative 11/12/22 0153)  insulin aspart (novoLOG) injection 0-15 Units (3 Units Subcutaneous Given 11/12/22 1209)  cholestyramine (QUESTRAN) packet 4 g (4 g Oral Not Given 11/12/22 1248)  gabapentin (NEURONTIN) capsule 300 mg (300 mg Oral Given 11/12/22 0938)  galantamine (RAZADYNE ER) 24 hr capsule 8 mg (8 mg Oral Not Given 11/12/22 0940)  hydrOXYzine (ATARAX) tablet 25 mg (25 mg Oral Given 11/12/22 0153)  levothyroxine (SYNTHROID) tablet 75 mcg (75  mcg Oral Given 11/12/22 0640)  melatonin tablet 3 mg (3 mg Oral Given 11/12/22 0153)  oxyCODONE (Oxy IR/ROXICODONE) immediate release tablet 2.5 mg (2.5 mg Oral Given 11/12/22 0153)  predniSONE (DELTASONE) tablet 20 mg (20 mg Oral Given 11/12/22 0814)  metoprolol succinate (TOPROL-XL) 24 hr tablet 100 mg (100 mg Oral Given 11/12/22 0938)  nortriptyline (PAMELOR) capsule 100 mg (100 mg Oral Given 11/12/22 0220)  pyridostigmine (MESTINON) tablet 30 mg (30 mg Oral Not Given 11/12/22 0941)  spironolactone (ALDACTONE) tablet 25 mg (25 mg Oral Given 11/12/22 0153)  sodium chloride (OCEAN) 0.65 % nasal spray 1 spray (has no administration in time range)  pantoprazole (PROTONIX) EC tablet 40 mg (40 mg Oral Given 11/12/22 0938)  fenofibrate tablet 160 mg (160 mg Oral Not Given 11/12/22 0941)  ferrous sulfate tablet 325 mg (325 mg Oral Given 11/12/22 3646)  aspirin chewable tablet 81 mg (81 mg Oral Given 11/12/22 0938)  hydrALAZINE (APRESOLINE) injection 5-10 mg (has no administration in time range)  ondansetron [facility]) injection 4 mg (4 mg Intravenous Given 11/11/22 2208)  morphine (PF) 2 MG/ML injection 2 mg (2 mg Intravenous Given 11/11/22 2208)    Mobility walks with device pt uses a walker has not gotten up because no walker here uses bedpan      Focused Assessments     R Recommendations: See Admitting Provider Note  Report given to:   Additional Notes:  takes meds whole, alert, family at bedside, room air, gets privigen infusions once a day

## 2022-11-12 NOTE — Assessment & Plan Note (Addendum)
Cont home metoprolol (also okayed by neurologist), aldactone Hydralazine PRN if needed

## 2022-11-12 NOTE — Progress Notes (Signed)
Patient seen and examined personally, I reviewed the chart, history and physical and admission note, done by admitting physician this morning and agree with the same with following addendum.  Please refer to the morning admission note for more detailed plan of care.  She feels somewhat stronger today Denies nausea vomiting, feels comfortable. Lungs are clear on exam, equal strength bilateral upper extremity lower extremities   Briefly,  82 y.o. 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 She had no fevers, nausea, vomiting,has been having some diarrhea from mestinone.  No urinary symptoms. Currently on prednisone and mestinone. Seen in the ED, hemodynamically stable on room air afebrile Labs with creatinine 1.2 otherwise fairly stable CBC and BMP UA negative for UTI, respiratory virus panel negative underwent CT head and CT lumbar spine-multifactorial DJD no acute finding,, Generalized cerebral atrophy.  Neurology was consulted admitted for myasthenia crisis   issues Myasthenia crisis: Appreciate neurology plan continue NIF/vital capacity every 8 IR ordered placed nursing staff informed.  Currently no obvious evidence of infection.  Continue IVIG per neurology.  Continue predinsone 20 mg, . Her galantamine, pyridostigmine, Neurontin.  CKD stage IIIa creatinine about the baseline. Hypothyroidism on Synthroid Type 2 diabetes mellitus holding metformin keep on SSI Hypertension BP stable on metoprolol Aldactone Atrial fibrillation:One episode for a couple of seconds in hospital once, none since. Cont home metoprolol Not on chronic AC

## 2022-11-12 NOTE — Assessment & Plan Note (Addendum)
NIF Q8H RVP given recent URI symptoms this past week to look for cause of exacerbation Neuro consult IVIG per neurology Cont current doses of prednisone and pyridostigmine unless neurology instructs otherwise.

## 2022-11-12 NOTE — ED Notes (Signed)
RT at bedside.

## 2022-11-12 NOTE — ED Notes (Signed)
Pt placed on bedpan

## 2022-11-12 NOTE — ED Notes (Addendum)
Pt transferred over placed on monitor and purewick, call light within reach pt belongings on stretcher

## 2022-11-12 NOTE — Assessment & Plan Note (Signed)
Hold metformin Mod scale SSI AC

## 2022-11-12 NOTE — H&P (Addendum)
History and Physical    Patient: Heidi [name] KHT:[id number] DOB: November 15, 1940 DOA: 11/11/2022 DOS: the patient was seen and examined on 11/12/2022 PCP: Heidi Phoenix, NP  Patient coming from: Home  Chief Complaint:  Chief Complaint  Patient presents with   Back Pain   Fall   HPI: [name] is a 82 y.o. 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  Pt in to ED with c/o BLE weakness for past 1 week.  Increasing falls at home.  No fevers, nausea, vomiting.  Has been having some diarrhea from mestinone.  No urinary symptoms.  Currently on prednisone and mestinone.    Review of Systems: As mentioned in the history of present illness. All other systems reviewed and are negative. Past Medical History:  Diagnosis Date   Anxiety    Atrial fibrillation ([place])    Intermittent   CAD (coronary artery disease)    non obstructive   CKD (chronic kidney disease)    Diabetes mellitus without complication (HCC)    Dysrhythmia    PAF   GERD (gastroesophageal reflux disease)    Glaucoma    N[place]   Heart murmur    HTN (hypertension)    Hyperlipidemia    Hypothyroid    Major depression    10-17-2010: hospitalized for suicidal, [name] (D/C 10-30-2010)   Migraine headache    OSA (obstructive sleep apnea)    CPAP nightly   Peripheral neuropathy    Presence of permanent cardiac pacemaker    Seasonal allergies    Sleep apnea    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    ABDOMINAL HYSTERECTOMY  1976   APPENDECTOMY     BREAST EXCISIONAL BIOPSY     BREAST SURGERY     fibroids removed, benign   CARDIAC CATHETERIZATION  02-12-2006   Minor irregularities: RCA-mid 40%, LM-normal, LAD-normal. EF 65%.   CHOLECYSTECTOMY      ECTOPIC PREGNANCY SURGERY     treatment x3, last one 11-02-2009 at [facility]   goiter     goiter removed     MASS EXCISION Right 10/18/2015   Procedure: EXCISION OF 6CM MASS ON RIGHT  NECK/BACK;  Surgeon: [name], MD;  Location: [place];  Service: General;  Laterality: Right;   PACEMAKER GENERATOR CHANGE N/A 01/15/2012   Procedure: PACEMAKER GENERATOR CHANGE;  Surgeon: Deboraha Sprang, MD;  Location: [facility] CATH [facility];  Service: Cardiovascular;  Laterality: N/A;   PACEMAKER INSERTION     Permanent. [employer] EnRhyth 04/2006, set as DDDR   REVERSE SHOULDER ARTHROPLASTY Right 05/01/2022   Procedure: REVERSE SHOULDER ARTHROPLASTY;  Surgeon: Hiram Gash, MD;  Location: [facility];  Service: Orthopedics;  Laterality: Right;   TUMOR REMOVAL  2000   Fibroid from breast x4-most recent   WRIST FRACTURE SURGERY  11-2007   right, had metal plate inserted   Social History:  reports that she has quit smoking. She has never used smokeless tobacco. She reports that she does not drink alcohol and does not use drugs.  Allergies  Allergen Reactions   Crestor [Rosuvastatin Calcium] Other (See Comments)    Caused Myalgias   Effexor [Venlafaxine Hydrochloride] Other (See Comments) and Hypertension    Caused BP to go up  Aricept [Donepezil]     Other reaction(s): diarrhea   Latex     Other reaction(s): rash   Nardil [Phenelzine]     Other reaction(s): HTN   Venlafaxine     BP problems Other reaction(s): Unkown   Atorvastatin Rash    Other reaction(s): rash   Clopidogrel Rash   Hibiclens [Chlorhexidine Gluconate] Hives   Lipitor [Atorvastatin Calcium] Rash   Lisinopril Rash    Other reaction(s): rash   Nardil Other (See Comments)    Caused BP Problems   Penicillins Swelling and Other (See Comments)    Tolerated Cephalosporin Date: 05/01/22     Plavix [Clopidogrel Bisulfate] Rash   Simvastatin Rash    Other reaction(s): rash   Sulfa Antibiotics Rash   Sulfonamide  Derivatives Rash   Tape Rash    Other reaction(s): rash    Family History  Problem Relation Age of Onset   Stomach cancer Father    Atrial fibrillation Brother     Prior to Admission medications   Medication Sig Start Date End Date Taking? Authorizing Provider  acetaminophen (TYLENOL) 325 MG tablet Take 2 tablets (650 mg total) by mouth every 6 (six) hours as needed for mild pain (or Fever >/= 101). 05/30/22   Elgergawy, Silver Huguenin, MD  Cholecalciferol (VITAMIN D-3) 125 MCG (5000 UT) TABS Take 5,000 Units by mouth daily.    [provider]  cholestyramine (QUESTRAN) 4 g packet Take 4 g by mouth daily. 08/13/18   [provider]  cyanocobalamin 1000 MCG tablet Take 1 tablet (1,000 mcg total) by mouth daily. 05/31/22   Elgergawy, Silver Huguenin, MD  feeding supplement (ENSURE ENLIVE / ENSURE PLUS) LIQD Take 237 mLs by mouth daily. 05/30/22   Elgergawy, Silver Huguenin, MD  fenofibrate 160 MG tablet Take 160 mg by mouth daily. Take with food 12/30/18   [provider]  ferrous sulfate 325 (65 FE) MG tablet Take 325 mg by mouth daily.    [provider]  fexofenadine-pseudoephedrine (ALLEGRA-D) 60-120 MG 12 hr tablet Take 1 tablet by mouth 2 (two) times daily.    [provider]  fluticasone (FLONASE) 50 MCG/ACT nasal spray Place 1 spray into the nose 2 (two) times daily.    [provider]  furosemide (LASIX) 20 MG tablet TAKE 1 TABLET BY MOUTH 3 TIMES A WEEK. Patient taking differently: Take 20 mg by mouth every Monday, Wednesday, and Friday. 05/07/21   Jerline Pain, MD  gabapentin (NEURONTIN) 300 MG capsule Take 300 mg by mouth 3 (three) times daily.    [provider]  galantamine (RAZADYNE ER) 8 MG 24 hr capsule Take 8 mg by mouth daily. 08/13/18   [provider]  hydrOXYzine (ATARAX/VISTARIL) 25 MG tablet Take 25 mg by mouth at bedtime.    [provider]  levothyroxine (SYNTHROID, LEVOTHROID) 75 MCG tablet Take 75 mcg by mouth  daily.    [provider]  melatonin 3 MG TABS tablet Take 1 tablet (3 mg total) by mouth at bedtime. 05/30/22   Elgergawy, Silver Huguenin, MD  metFORMIN (GLUCOPHAGE) 500 MG tablet Take 500 mg by mouth 2 (two) times daily.    [provider]  methocarbamol (ROBAXIN) 500 MG tablet Take 1 tablet (500 mg total) by mouth every 8 (eight) hours as needed for muscle spasms. 05/03/22   Ethelda Chick, PA-C  metoprolol succinate (TOPROL-XL) 100 MG 24 hr tablet TAKE 1 TABLET(100 MG) BY MOUTH DAILY 04/25/21   Jerline Pain, MD  Multiple Vitamins-Minerals (ADULT GUMMY PO) Take 1 capsule by mouth daily.    [provider]  niacin (VITAMIN B3) 100 MG tablet Take 1 tablet (100 mg total) by mouth at bedtime. 05/30/22   Elgergawy, Silver Huguenin, MD  nortriptyline (PAMELOR) 50 MG capsule Take 2 capsules (100 mg total) by mouth at bedtime. 05/30/22   Elgergawy, Silver Huguenin, MD  pantoprazole (PROTONIX) 40 MG tablet Take 40 mg by mouth 2 (two) times daily. 06/01/18   [provider]  predniSONE (DELTASONE) 20 MG tablet Take 1 tablet (20 mg total) by mouth daily with breakfast. 10/30/22   Narda Amber K, DO  PRESCRIPTION MEDICATION Inhale into the lungs at bedtime. CPAP    [provider]  pyridostigmine (MESTINON) 60 MG tablet Take half-tablet at 10a and 4pm. 10/30/22   Narda Amber K, DO  sodium chloride (OCEAN) 0.65 % SOLN nasal spray Place 1 spray into both nostrils every 2 (two) hours as needed for congestion.    [provider]  spironolactone (ALDACTONE) 25 MG tablet Take 1 tablet (25 mg total) by mouth daily. Patient taking differently: Take 25 mg by mouth at bedtime. 10/03/17   Deboraha Sprang, MD    Physical Exam: Vitals:   11/11/22 1555 11/11/22 1600 11/11/22 2043 11/11/22 2211  BP: 132/71  (!) 145/78 (!) 143/83  Pulse: 99  86 86  Resp: '16  15 16  '$ Temp: 98.4 F (36.9 C)  98.6 F (37 C)   TempSrc:   Oral   SpO2: 96%  96% 98%  Weight:  69.9 kg    Height:  '5\' 1"'$   (1.549 m)     Constitutional: NAD, calm, comfortable Respiratory: clear to auscultation bilaterally, no wheezing, no crackles. Normal respiratory effort. No accessory muscle use.  Cardiovascular: Regular rate and rhythm, no murmurs / rubs / gallops. No extremity edema. 2+ pedal pulses. No carotid bruits.  Abdomen: no tenderness, no masses palpated. No hepatosplenomegaly. Bowel sounds positive.  Neurologic: 4+/5 strength in deltoid bilaterally.  RLE testing deferred by pt due to sciatica, LLE 4+/5 hip flexor, otherwise 5/5.  Neck flexion 4-/5 Psychiatric: Normal judgment and insight. Alert and oriented x 3. Normal mood.   Data Reviewed:       Latest Ref Rng & Units 11/11/2022    4:27 PM 05/27/2022    7:25 PM 05/26/2022    6:32 PM  CBC  WBC 4.0 - 10.5 K/uL 9.8  8.0  7.7   Hemoglobin 12.0 - 15.0 g/dL 14.8  11.8  12.2   Hematocrit 36.0 - 46.0 % 45.7  36.4  38.0   Platelets 150 - 400 K/uL 257  267  283       Latest Ref Rng & Units 11/11/2022    4:27 PM 09/11/2022    5:31 PM 05/30/2022    3:59 AM  CMP  Glucose 70 - 99 mg/dL 304   120   BUN 8 - 23 mg/dL 16   32   Creatinine 0.44 - 1.00 mg/dL 1.25  0.60  1.01   Sodium 135 - 145 mmol/L 137   137   Potassium 3.5 - 5.1 mmol/L 4.3   4.1   Chloride 98 - 111 mmol/L 100   109   CO2 22 - 32 mmol/L 24   21   Calcium 8.9 - 10.3 mg/dL 10.1   9.8      Assessment and Plan: * Myasthenic crisis ([place]) NIF Q8H RVP given recent URI symptoms this past week to look  for cause of exacerbation Neuro consult IVIG per neurology Cont current doses of prednisone and pyridostigmine unless neurology instructs otherwise.  CKD (chronic kidney disease), stage III (HCC) Creat 1.2 today just slightly above her baseline of 1.0. Monitor daily BMP  Hypothyroidism Cont synthroid  Type II diabetes mellitus with renal manifestations (HCC) Hold metformin Mod scale SSI AC  HTN (hypertension) Cont home metoprolol (also okayed by neurologist), aldactone Hydralazine  PRN if needed  A-fib [employer]) One episode for a couple of seconds in hospital once, none since. Cont home metoprolol Not on chronic [facility]      Advance Care Planning:   Code Status: Full Code Pt is DNR but would want intubation if needed for myasthenic crisis.  Consults: Dr. Curly [place]  Family Communication: No family in room  Severity of Illness: The appropriate patient status for this patient is OBSERVATION. Observation status is judged to be reasonable and necessary in order to provide the required intensity of service to ensure the patient's safety. The patient's presenting symptoms, physical exam findings, and initial radiographic and laboratory data in the context of their medical condition is felt to place them at decreased risk for further clinical deterioration. Furthermore, it is anticipated that the patient will be medically stable for discharge from the hospital within 2 midnights of admission.   Author: [name]., DO 11/12/2022 12:13 AM  For on call review [email or web address].

## 2022-11-12 NOTE — Progress Notes (Signed)
NIF >-40 VC 1.3L  Pt had great effort!

## 2022-11-12 NOTE — ED Notes (Signed)
Pt reports good breakfast, reclined the bed per request call light within reach

## 2022-11-12 NOTE — ED Notes (Signed)
Daughter at bedside.

## 2022-11-12 NOTE — Assessment & Plan Note (Signed)
Cont synthroid

## 2022-11-12 NOTE — ED Notes (Signed)
Kitchen called and tray is out for delivery

## 2022-11-12 NOTE — ED Notes (Signed)
RT called and will come by and look in on pt

## 2022-11-12 NOTE — Assessment & Plan Note (Addendum)
One episode for a couple of seconds in hospital once, none since. Cont home metoprolol Not on chronic AC

## 2022-11-12 NOTE — ED Notes (Signed)
Pt reports being allergic to pork, sour cream and swiss cheese, updated allergy list

## 2022-11-13 LAB — BASIC METABOLIC PANEL
Anion gap: 9 (ref 5–15)
BUN: 14 mg/dL (ref 8–23)
CO2: 28 mmol/L (ref 22–32)
Calcium: 10 mg/dL (ref 8.9–10.3)
Chloride: 102 mmol/L (ref 98–111)
Creatinine, Ser: 1 mg/dL (ref 0.44–1.00)
GFR, Estimated: 57 mL/min — ABNORMAL LOW (ref >60)
Glucose, Bld: 131 mg/dL — ABNORMAL HIGH (ref 70–99)
Potassium: 4.2 mmol/L (ref 3.5–5.1)
Sodium: 139 mmol/L (ref 135–145)

## 2022-11-13 LAB — GLUCOSE, CAPILLARY
Glucose-Capillary: 116 mg/dL — ABNORMAL HIGH (ref 70–99)
Glucose-Capillary: 190 mg/dL — ABNORMAL HIGH (ref 70–99)
Glucose-Capillary: 225 mg/dL — ABNORMAL HIGH (ref 70–99)
Glucose-Capillary: 202 mg/dL — ABNORMAL HIGH (ref 70–99)

## 2022-11-13 LAB — CBC
HCT: 42.4 % (ref 36.0–46.0)
Hemoglobin: 13.9 g/dL (ref 12.0–15.0)
MCH: 29.4 pg (ref 26.0–34.0)
MCHC: 32.8 g/dL (ref 30.0–36.0)
MCV: 89.8 fL (ref 80.0–100.0)
Platelets: 198 10*3/uL (ref 150–400)
RBC: 4.72 MIL/uL (ref 3.87–5.11)
RDW: 13.9 % (ref 11.5–15.5)
WBC: 8.1 10*3/uL (ref 4.0–10.5)
nRBC: 0 % (ref 0.0–0.2)

## 2022-11-13 MED ORDER — KETOROLAC TROMETHAMINE 15 MG/ML IJ SOLN
15.0000 mg | Freq: Once | INTRAMUSCULAR | Status: AC
  Administered 2022-11-13: 15 mg via INTRAVENOUS
  Filled 2022-11-13: qty 1

## 2022-11-13 MED ORDER — KETOROLAC TROMETHAMINE 15 MG/ML IJ SOLN
15.0000 mg | Freq: Four times a day (QID) | INTRAMUSCULAR | Status: DC | PRN
  Administered 2022-11-14 – 2022-11-15 (×4): 15 mg via INTRAVENOUS
  Filled 2022-11-13 (×4): qty 1

## 2022-11-13 MED ORDER — TIZANIDINE HCL 4 MG PO TABS
2.0000 mg | ORAL_TABLET | Freq: Three times a day (TID) | ORAL | Status: DC | PRN
  Administered 2022-11-13 – 2022-11-15 (×5): 2 mg via ORAL
  Filled 2022-11-13 (×6): qty 1

## 2022-11-13 MED ORDER — MORPHINE SULFATE (PF) 2 MG/ML IV SOLN: 2.0000 mg | INTRAVENOUS | Status: DC | PRN

## 2022-11-13 NOTE — Progress Notes (Signed)
NIF -40 + VC .8 Pt performed with great effort

## 2022-11-13 NOTE — Progress Notes (Addendum)
Neurology Progress Note   S://  Patient was seen this morning. She reports pain in his extremities when she exerts effort. She also noticed some dysphagia and dysarthria. She feels her extremity weakness is improving. She reports that pyridostigmine gives her diarrhea and prefers to take the IVIG.    O:// Current vital signs: BP (!) 148/72   Pulse 91   Temp 97.6 F (36.4 C) (Oral)   Resp 19   Ht '5\' 1"'$  (1.549 m)   Wt 69.9 kg   SpO2 95%   BMI 29.10 kg/m  Vital signs in last 24 hours: Temp:  [97.6 F (36.4 C)-98.3 F (36.8 C)] 97.6 F (36.4 C) (02/07 0855) Pulse Rate:  [60-97] 91 (02/07 0925) Resp:  [13-21] 19 (02/07 0925) BP: (131-178)/(56-124) 148/72 (02/07 0925) SpO2:  [92 %-99 %] 95 % (02/07 0925) GENERAL: Awake, alert in NAD HEENT: - Normocephalic and atraumatic, dry mm, no LN++, no Thyromegally LUNGS - Clear to auscultation bilaterally with no wheezes CV - S1S2 RRR, no m/r/g, equal pulses bilaterally. ABDOMEN - Soft, nontender, nondistended with normoactive BS Ext: warm, well perfused, intact peripheral pulses, no edema NEURO:  Mental Status: AA&Ox3  Language: speech is fluent.  Naming, repetition, fluency, and comprehension intact. Cranial Nerves: PERRL. EOMI, visual fields full, no diplopia/nystagmus/ptosis, no facial asymmetry, facial sensation intact, hearing intact, tongue/uvula/soft palate midline, normal sternocleidomastoid and trapezius muscle strength.  Motor: some fatigable weakness in the RLE but exam limited due to arthritic pain, 5/5 strength in LUE, RUE, LLE Tone: is normal and bulk is normal Sensation- Intact to light touch bilaterally Coordination: FTN intact bilaterally, no ataxia in BLE. Gait- deferred    Medications  Current Facility-Administered Medications:    acetaminophen (TYLENOL) tablet 650 mg, 650 mg, Oral, Q6H PRN, 650 mg at 11/13/22 8841 **OR** acetaminophen (TYLENOL) suppository 650 mg, 650 mg, Rectal, Q6H PRN, [name], [name], DO    aspirin chewable tablet 81 mg, 81 mg, Oral, Daily, [name], [name], DO, 81 mg at 11/13/22 6606   cholestyramine (QUESTRAN) packet 4 g, 4 g, Oral, Q1200, [name], [name], DO   enoxaparin (LOVENOX) injection 40 mg, 40 mg, Subcutaneous, Q24H, [name], [name], DO, 40 mg at 11/12/22 1542   fenofibrate tablet 160 mg, 160 mg, Oral, Daily, [name], [name], DO, 160 mg at 11/13/22 3016   ferrous sulfate tablet 325 mg, 325 mg, Oral, Daily, [name], [name], DO, 325 mg at 11/13/22 0109   gabapentin (NEURONTIN) capsule 300 mg, 300 mg, Oral, TID, [name], DO, 300 mg at 11/13/22 3235   galantamine (RAZADYNE ER) 24 hr capsule 8 mg, 8 mg, Oral, Daily, [name], [name], DO   hydrALAZINE (APRESOLINE) injection 5-10 mg, 5-10 mg, Intravenous, Q4H PRN, [name], [name], DO   hydrOXYzine (ATARAX) tablet 25 mg, 25 mg, Oral, QHS, [name], [name], DO, 25 mg at 11/12/22 2153   Immune Globulin 10% (PRIVIGEN) IV infusion 25 g, 400 mg/kg (Adjusted), Intravenous, Q24 Hr x 5, Kc, Ramesh, MD, 25 g at 11/13/22 0843   insulin aspart (novoLOG) injection 0-15 Units, 0-15 Units, Subcutaneous, TID WC, [name], DO, 3 Units at 11/12/22 1717   levothyroxine (SYNTHROID) tablet 75 mcg, 75 mcg, Oral, Q0600, [name], DO, 75 mcg at 11/13/22 0600   melatonin tablet 3 mg, 3 mg, Oral, QHS, [name], [name], DO, 3 mg at 11/12/22 2153   metoprolol succinate (TOPROL-XL) 24 hr tablet 100 mg, 100 mg, Oral, Daily, [name], [name], DO, 100 mg  at 11/13/22 0923   nortriptyline (PAMELOR) capsule 100 mg, 100 mg, Oral, QHS, [name], DO, 100 mg at 11/12/22 2153   ondansetron (ZOFRAN) tablet 4 mg, 4 mg, Oral, Q6H PRN **OR** ondansetron (ZOFRAN) injection 4 mg, 4 mg, Intravenous, Q6H PRN, [name], DO   oxyCODONE (Oxy IR/ROXICODONE) immediate release tablet 2.5 mg, 2.5 mg, Oral, BID PRN, [name], DO, 2.5 mg at 11/12/22 0153   pantoprazole (PROTONIX) EC tablet 40 mg, 40 mg, Oral, BID, Jennette Kettle M, DO,  40 mg at 11/13/22 7867   predniSONE (DELTASONE) tablet 20 mg, 20 mg, Oral, Q breakfast, Jennette Kettle M, DO, 20 mg at 11/13/22 0759   pyridostigmine (MESTINON) tablet 30 mg, 30 mg, Oral, BID, Jennette Kettle M, DO, 30 mg at 11/13/22 6720   sodium chloride (OCEAN) 0.65 % nasal spray 1 spray, 1 spray, Each Nare, Q2H PRN, [name], DO   sodium chloride 0.9 % bolus 500 mL, 500 mL, Intravenous, Q24 Hr x 5, Last Rate: 999 mL/hr at 11/13/22 0803, 500 mL at 11/13/22 0803 **AND** sodium chloride 0.9 % bolus 500 mL, 500 mL, Intravenous, Q24 Hr x 5, Kc, Ramesh, MD   spironolactone (ALDACTONE) tablet 25 mg, 25 mg, Oral, QHS, [name], DO, 25 mg at 11/12/22 2153  Labs CBC    Component Value Date/Time   WBC 8.1 11/13/2022 0310   RBC 4.72 11/13/2022 0310   HGB 13.9 11/13/2022 0310   HCT 42.4 11/13/2022 0310   PLT 198 11/13/2022 0310   MCV 89.8 11/13/2022 0310   MCH 29.4 11/13/2022 0310   MCHC 32.8 11/13/2022 0310   RDW 13.9 11/13/2022 0310   LYMPHSABS 3.0 05/27/2022 1925   MONOABS 0.5 05/27/2022 1925   EOSABS 0.8 (H) 05/27/2022 1925   BASOSABS 0.0 05/27/2022 1925    CMP     Component Value Date/Time   NA 139 11/13/2022 0310   K 4.2 11/13/2022 0310   CL 102 11/13/2022 0310   CO2 28 11/13/2022 0310   GLUCOSE 131 (H) 11/13/2022 0310   BUN 14 11/13/2022 0310   CREATININE 1.00 11/13/2022 0310   CREATININE 0.93 06/18/2016 1528   CALCIUM 10.0 11/13/2022 0310   PROT 6.3 (L) 05/27/2022 1925   ALBUMIN 3.6 05/27/2022 1925   AST 18 05/27/2022 1925   ALT 18 05/27/2022 1925   ALKPHOS 93 05/27/2022 1925   BILITOT 0.5 05/27/2022 1925   GFRNONAA 57 (L) 11/13/2022 0310   GFRAA >60 10/16/2018 1916    glycosylated hemoglobin  Lipid Panel     Component Value Date/Time   CHOL 206 (H) 05/27/2022 2238   TRIG 171 (H) 05/27/2022 2238   HDL 60 05/27/2022 2238   CHOLHDL 3.4 05/27/2022 2238   VLDL 34 05/27/2022 2238   LDLCALC 112 (H) 05/27/2022 2238    Respiratory Panel  Negative   Imaging  CT-scan of the brain IMPRESSION: 1. No acute intracranial abnormality. 2. Generalized cerebral atrophy and microvascular disease changes of the supratentorial brain  CT lumbar spine  IMPRESSION: 1. No acute osseous injury within the lumbar spine. 2. 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 3. 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  Assessment:  Heidi [name] is a 82 y.o. female with a past medical history significant for hypertension, hyperlipidemia, diabetes, obstructive sleep apnea on CPAP, hypothyroidism, tachy/bradycardia syndrome s/p pacemaker, CKD stage III, neuropathy, mild cognitive impairment, seronegative myasthenia gravis (follows with Dr.  Patel, EMG diagnostic)   Impression: Patient shows improving strength in her extremities and does not show signs of diplopia/nystagmus/ptosis on neurological exam. Will continue IVIG treatment and monitor for symptomatic improvement.  Impression: Possible myasthenia exacerbation  Recommendations: -Every 8 hour NIF and vital capacity testing -Continue home prednisone -Continue pyridostigmine 30 mg qAM and qPM as tolerated-will not increase because of diarrhea side effects -Continue IVIG 2 g/kg divided over 5 days (started 2/6), may be adjusted 4 hours earlier daily (q20 hours) to facilitate earlier discharge.  -500 cc Normal saline pre and post for renal protection -Avoid meds listed below   Drugs to avoid if you have Myasthenia Gravis:   Many drugs have been reported to have adverse effects in patients with MG (see below). However, not all patients react adversely to all these drugs. Conversely, not all "safe" drugs can be used with impunity in patients with MG.As a rule, the listed drugs should be avoided whenever possible, and patients with MG should be followed closely when any new drug is introduced.  Drugs that may  exacerbate MG  Antibiotics  Aminoglycosides: e.g., streptomycin, tobramycin, kanamycin  Quinolones: e.g., ciprofloxacin, levofloxacin, ofloxacin, gatifloxacin  Macrolides: e.g., erythromycin, azithromycin, telithromycin  Nondepolarizing muscle relaxants for surgery  d-Tubocurarine (curare), pancuronium, vecuronium, atracurium  Beta-blocking agents  Propranolol, atenolol, metoprolol  Local anesthetics and related agents  Procaine, Xylocaine in large amounts  Procainamide (for arrhythmias)  Botulinum toxin  Botox exacerbates weakness.  Quinine derivatives  Quinine, quinidine, chloroquine, mefloquine (Lariam)  Magnesium  Decreases ACh release  Penicillamine  May cause MG  Drugs with important interactions in MG  Cyclosporine  Broad range of drug interactions, which may raise or lower cyclosporine levels  Azathioprine  Avoid allopurinol; combination may result in myelosuppression.     Coralyn Pear, MD PGY-1 Psychiatry   Attending Neurohospitalist Addendum Patient seen and examined with APP/Resident. Agree with the history and physical as documented above. Agree with the plan as documented, which I helped formulate. I have independently reviewed the chart, obtained history, review of systems and examined the patient.I have personally reviewed pertinent head/neck/spine imaging (CT/MRI). Please feel free to call with any questions.  -- Amie Portland, MD Neurologist [facility] Pager: [phone]

## 2022-11-13 NOTE — Progress Notes (Signed)
PROGRESS NOTE Heidi [name]  EZM:[id number] DOB: 08/06/1941 DOA: 11/11/2022 PCP: Earmon Phoenix, NP   Brief Narrative/Hospital Course: 82 y.o. 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 She had no fevers, nausea, vomiting,has been having some diarrhea from mestinone.  No urinary symptoms. Currently on prednisone and mestinone. Seen in the ED, hemodynamically stable on room air afebrile Labs with creatinine 1.2 otherwise fairly stable CBC and BMP UA negative for UTI, respiratory virus panel negative underwent CT head and CT lumbar spine-multifactorial DJD no acute finding,, Generalized cerebral atrophy.  Neurology was consulted admitted for myasthenia crisis   Subjective: Patient seen and examined, daughter at the bedside reports having back pain-added muscle relaxant. Not much of  swallowing issues but complains of some dysarthria lower leg extremity weakness improving    Assessment and Plan: Principal Problem:   Myasthenic crisis ([place]) Active Problems:   A-fib (HCC)   HTN (hypertension)   Type II diabetes mellitus with renal manifestations (HCC)   Hypothyroidism   CKD (chronic kidney disease), stage III ([place])  Myasthenia crisis: Appreciate neurology plan continue NIF/vital capacity every 8 IR. Continue IVIG x 5 days -started 2.6, cont  current predinsone 20 mg, galantamine, pyridostigmine, Neurontin.  Continue to monitor neurostatus closely appreciate neurology input on board and defer further plan to neurology  CKD stage IIIa creatinine about the baseline.  Monitor Recent Labs    04/22/22 1433 05/26/22 1832 05/27/22 1925 05/30/22 0359 09/11/22 1731 11/11/22 1627 11/12/22 0159 11/13/22 0310  BUN 19 28* 18 32*  --  '16 16 14  '$ CREATININE 1.03* 1.18* 1.02* 1.01* 0.60 1.25* 1.01* 1.00    Hypothyroidism continue on Synthroid Type 2 diabetes mellitus  holding metformin keep on SSI blood sugar well-controlled, Recent Labs  Lab 11/12/22 0757 11/12/22 1142 11/12/22 1603 11/12/22 2047 11/13/22 0802  GLUCAP 116* 177* 181* 141* 116*    Hypertension BP stable on metoprolol Aldactone, continue the same Paroxysmal atrial fibrillation:One episode for a couple of seconds in hospital once, none since. Cont home metoprolol Not on chronic AC  DVT prophylaxis: enoxaparin (LOVENOX) injection 40 mg Start: 11/12/22 1400 Code Status:   Code Status: DNR Family Communication: plan of care discussed with patient at bedside. Patient status is: Inpatient because of myasthenia crisis Level of care: Progressive   Dispo: The patient is from: home            Anticipated disposition: home-once cleared by neurology on coming Saturday or Sunday    Objective: Vitals last 24 hrs: Vitals:   11/13/22 0940 11/13/22 0955 11/13/22 1010 11/13/22 1110  BP: (!) 144/61 (!) 146/65 (!) 103/47 (!) 144/70  Pulse: 81 81 87 89  Resp: '18 20 17 19  '$ Temp:    98.5 F (36.9 C)  TempSrc:    Oral  SpO2: 97% 98% 97% 98%  Weight:      Height:       Weight change:   Physical Examination: General exam: alert awake, older than stated age HEENT:Oral mucosa moist, Ear/Nose WNL grossly Respiratory system: bilaterally clear BS, no use of accessory muscle Cardiovascular system: S1 & S2 +, No JVD. Gastrointestinal system: Abdomen soft,NT,ND, BS+ Nervous System:Alert, awake, moving extremities. Extremities: LE edema neg,distal peripheral pulses palpable.  Skin: No rashes,no icterus. MSK: Normal muscle bulk,tone, power  Medications reviewed:  Scheduled Meds:  aspirin  81 mg Oral Daily  cholestyramine  4 g Oral Q1200   enoxaparin (LOVENOX) injection  40 mg Subcutaneous Q24H   fenofibrate  160 mg Oral Daily   ferrous sulfate  325 mg Oral Daily   gabapentin  300 mg Oral TID   galantamine  8 mg Oral Daily   hydrOXYzine  25 mg Oral QHS   insulin aspart  0-15 Units  Subcutaneous TID WC   levothyroxine  75 mcg Oral Q0600   melatonin  3 mg Oral QHS   metoprolol succinate  100 mg Oral Daily   nortriptyline  100 mg Oral QHS   pantoprazole  40 mg Oral BID   predniSONE  20 mg Oral Q breakfast   pyridostigmine  30 mg Oral BID   spironolactone  25 mg Oral QHS   Continuous Infusions:  Immune Globulin 10%     sodium chloride 10 mL/hr at 11/13/22 1000   And   sodium chloride      Diet Order             Diet Carb Modified Fluid consistency: Thin; Room service appropriate? Yes  Diet effective now                  Intake/Output Summary (Last 24 hours) at 11/13/2022 1149 Last data filed at 11/13/2022 1000 Gross per 24 hour  Intake 563.82 ml  Output 300 ml  Net 263.82 ml   Net IO Since Admission: 1,513.82 mL [11/13/22 1149]  Wt Readings from Last 3 Encounters:  11/11/22 69.9 kg  10/30/22 69.9 kg  08/28/22 67.6 kg     Unresulted Labs (From admission, onward)     Start     Ordered   11/13/22 6063  Basic metabolic panel  Daily,   R      11/12/22 1153   11/13/22 0500  CBC  Daily,   R      11/12/22 1153          Data Reviewed: I have personally reviewed following labs and imaging studies CBC: Recent Labs  Lab 11/11/22 1627 11/12/22 0159 11/13/22 0310  WBC 9.8 11.3* 8.1  HGB 14.8 14.3 13.9  HCT 45.7 43.5 42.4  MCV 90.9 90.8 89.8  PLT 257 234 016   Basic Metabolic Panel: Recent Labs  Lab 11/11/22 1627 11/12/22 0159 11/13/22 0310  NA 137 140 139  K 4.3 3.8 4.2  CL 100 100 102  CO2 '24 31 28  '$ GLUCOSE 304* 127* 131*  BUN '16 16 14  '$ CREATININE 1.25* 1.01* 1.00  CALCIUM 10.1 10.1 10.0   Recent Results (from the past 240 hour(s))  Respiratory (~20 pathogens) panel by PCR     Status: None   Collection Time: 11/12/22 12:10 AM   Specimen: Nasopharyngeal Swab; Respiratory  Result Value Ref Range Status   Adenovirus NOT DETECTED NOT DETECTED Final   Coronavirus 229E NOT DETECTED NOT DETECTED Final    Comment: (NOTE) The  Coronavirus on the Respiratory Panel, DOES NOT test for the novel  Coronavirus (2019 nCoV)    Coronavirus HKU1 NOT DETECTED NOT DETECTED Final   Coronavirus NL63 NOT DETECTED NOT DETECTED Final   Coronavirus OC43 NOT DETECTED NOT DETECTED Final   Metapneumovirus NOT DETECTED NOT DETECTED Final   Rhinovirus / Enterovirus NOT DETECTED NOT DETECTED Final   Influenza A NOT DETECTED NOT DETECTED Final   Influenza B NOT DETECTED NOT DETECTED Final   Parainfluenza Virus 1 NOT DETECTED NOT DETECTED Final   Parainfluenza Virus 2 NOT DETECTED NOT DETECTED  Final   Parainfluenza Virus 3 NOT DETECTED NOT DETECTED Final   Parainfluenza Virus 4 NOT DETECTED NOT DETECTED Final   Respiratory Syncytial Virus NOT DETECTED NOT DETECTED Final   Bordetella pertussis NOT DETECTED NOT DETECTED Final   Bordetella Parapertussis NOT DETECTED NOT DETECTED Final   Chlamydophila pneumoniae NOT DETECTED NOT DETECTED Final   Mycoplasma pneumoniae NOT DETECTED NOT DETECTED Final    Comment: Performed at [facility], [place] [address], [place], [place] [address]    Antimicrobials: Anti-infectives (From admission, onward)    None      Culture/Microbiology    Component Value Date/Time   SDES URINE, CLEAN CATCH 05/31/2017 1051   SPECREQUEST NONE 05/31/2017 1051   CULT  05/31/2017 1051    NO GROWTH Performed at [facility], [place] [address], [place], [place] [address]    REPTSTATUS 06/01/2017 FINAL 05/31/2017 1051    Other culture-see note  Radiology Studies: CT Lumbar Spine Wo Contrast  Result Date: 11/11/2022 CLINICAL DATA:  Initial evaluation for acute trauma.  Pain. EXAM: CT LUMBAR SPINE WITHOUT CONTRAST TECHNIQUE: Multidetector CT imaging of the lumbar spine was performed without intravenous contrast administration. Multiplanar CT image reconstructions were also generated. RADIATION DOSE REDUCTION: This exam was performed according to the departmental dose-optimization program which includes  automated exposure control, adjustment of the mA and/or kV according to patient size and/or use of iterative reconstruction technique. COMPARISON:  None available. FINDINGS: Segmentation: Standard. Lowest well-formed disc space labeled the L5-S1 level. Alignment: Moderate levoscoliosis, apex at L2-3. No significant listhesis. Vertebrae: Chronic endplate Schmorl's node deformity with mild height loss present at the T12 vertebral body. Otherwise, vertebral body height maintained without acute or chronic fracture. Visualized sacrum and pelvis intact. No worrisome osseous lesions. Paraspinal and other soft tissues: Paraspinous soft tissues demonstrate no acute finding. Moderate aorto bi-iliac atherosclerotic disease. Hepatic steatosis noted. Colonic diverticulosis noted as well. Disc levels: L1-2:  Mild disc bulge.  No canal or foraminal stenosis. L2-3: Advanced degenerative intervertebral disc space narrowing with disc desiccation and diffuse disc bulge, asymmetric to the right. Associated reactive endplate spurring. Moderate right worse than left facet hypertrophy. Resultant mild to moderate right lateral recess stenosis. Central canal remains patent. Moderate right L2 foraminal narrowing. No significant left foraminal stenosis. L3-4: Diffuse disc bulge, asymmetric to the right. Moderate bilateral facet hypertrophy. Resultant mild spinal stenosis. Moderate right with mild left L3 foraminal narrowing. L4-5: Diffuse disc bulge. Superimposed broad left subarticular disc protrusion indents the ventral thecal sac. Moderate facet hypertrophy. Resultant moderate to severe canal with left worse than right lateral recess stenosis. Mild bilateral L4 foraminal narrowing. L5-S1: Mild disc bulge. Moderate facet hypertrophy. No canal or foraminal stenosis. IMPRESSION: 1. No acute osseous injury within the lumbar spine. 2. 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 3. 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 Aortic Atherosclerosis (ICD10-I70.0). / Electronically Signed   By: Jeannine Boga M.D.   On: 11/11/2022 22:19   CT Head Wo Contrast  Result Date: 11/11/2022 CLINICAL DATA:  Neurological deficit.  Evaluate for acute stroke. EXAM: CT HEAD WITHOUT CONTRAST TECHNIQUE: Contiguous axial images were obtained from the base of the skull through the vertex without intravenous contrast. RADIATION DOSE REDUCTION: This exam was performed according to the departmental dose-optimization program which includes automated exposure control, adjustment of the mA and/or kV according to patient size and/or use of iterative reconstruction technique. COMPARISON:  May 29, 2022 FINDINGS: Brain: There is mild cerebral atrophy with widening of the extra-axial spaces and ventricular dilatation. There are areas of decreased attenuation within the white matter tracts of the supratentorial brain, consistent with microvascular disease changes. Vascular: There is moderate severity calcification of the bilateral cavernous carotid arteries. Skull: Normal. Negative for fracture or focal lesion. Sinuses/Orbits: No acute finding. Other: None. IMPRESSION: 1. No acute intracranial abnormality. 2. Generalized cerebral atrophy and microvascular disease changes of the supratentorial brain. Electronically Signed   By: Virgina Norfolk M.D.   On: 11/11/2022 19:23     LOS: 1 day   Antonieta Pert, MD [facility]  11/13/2022, 11:49 AM

## 2022-11-13 NOTE — Progress Notes (Signed)
RT attempted to obtain patient NIF and VC twice, but patient is unavailable this morning due to pain. RT will attempt again later.

## 2022-11-14 DIAGNOSIS — G7001 Myasthenia gravis with (acute) exacerbation: Secondary | ICD-10-CM | POA: Diagnosis not present

## 2022-11-14 LAB — BASIC METABOLIC PANEL
Anion gap: 6 (ref 5–15)
BUN: 27 mg/dL — ABNORMAL HIGH (ref 8–23)
CO2: 27 mmol/L (ref 22–32)
Calcium: 9.6 mg/dL (ref 8.9–10.3)
Chloride: 104 mmol/L (ref 98–111)
Creatinine, Ser: 1.04 mg/dL — ABNORMAL HIGH (ref 0.44–1.00)
GFR, Estimated: 54 mL/min — ABNORMAL LOW (ref >60)
Glucose, Bld: 190 mg/dL — ABNORMAL HIGH (ref 70–99)
Potassium: 3.9 mmol/L (ref 3.5–5.1)
Sodium: 137 mmol/L (ref 135–145)

## 2022-11-14 LAB — GLUCOSE, CAPILLARY
Glucose-Capillary: 119 mg/dL — ABNORMAL HIGH (ref 70–99)
Glucose-Capillary: 215 mg/dL — ABNORMAL HIGH (ref 70–99)
Glucose-Capillary: 188 mg/dL — ABNORMAL HIGH (ref 70–99)
Glucose-Capillary: 142 mg/dL — ABNORMAL HIGH (ref 70–99)

## 2022-11-14 LAB — CBC
HCT: 38.5 % (ref 36.0–46.0)
Hemoglobin: 12.1 g/dL (ref 12.0–15.0)
MCH: 29.1 pg (ref 26.0–34.0)
MCHC: 31.4 g/dL (ref 30.0–36.0)
MCV: 92.5 fL (ref 80.0–100.0)
Platelets: 175 10*3/uL (ref 150–400)
RBC: 4.16 MIL/uL (ref 3.87–5.11)
RDW: 13.9 % (ref 11.5–15.5)
WBC: 6.9 10*3/uL (ref 4.0–10.5)
nRBC: 0 % (ref 0.0–0.2)

## 2022-11-14 MED ORDER — VITAMIN B-12 1000 MCG PO TABS
1000.0000 ug | ORAL_TABLET | Freq: Every day | ORAL | Status: DC
  Administered 2022-11-14 – 2022-11-16 (×3): 1000 ug via ORAL
  Filled 2022-11-14 (×3): qty 1

## 2022-11-14 NOTE — Progress Notes (Signed)
VC .85 NIF >-40 With Great Effort

## 2022-11-14 NOTE — Progress Notes (Signed)
PROGRESS NOTE Heidi [name]  ZYS:[id number] DOB: 1941-01-03 DOA: 11/11/2022 PCP: Earmon Phoenix, NP   Brief Narrative/Hospital Course: 82 y.o. 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 She had no fevers, nausea, vomiting,has been having some diarrhea from mestinone.  No urinary symptoms. Currently on prednisone and mestinone. Seen in the ED, hemodynamically stable on room air afebrile Labs with creatinine 1.2 otherwise fairly stable CBC and BMP UA negative for UTI, respiratory virus panel negative underwent CT head and CT lumbar spine-multifactorial DJD no acute finding,, Generalized cerebral atrophy.  Neurology was consulted admitted for myasthenia crisis   Subjective: Seen and examined.   Patient reports her body ache and back pain significantly improved with Toradol.   Denies dysphagia or dysarthria    Assessment and Plan: Principal Problem:   Myasthenic crisis ([place]) Active Problems:   A-fib (HCC)   HTN (hypertension)   Type II diabetes mellitus with renal manifestations (HCC)   Hypothyroidism   CKD (chronic kidney disease), stage III ([place])  Myasthenia crisis: Appreciate neurology input.  Patient is clinically feeling better.continue to monitor NIF/vital capacity every 8 hr pr RT.Continue IVIG #3/5, predinsone 20 mg, galantamine, pyridostigmine, Neurontin. Continue to monitor neuro-status closely  CKD stage IIIa creatinine stable and at baseline.   Hypothyroidism continue on Synthroid Type 2 diabetes mellitus holding metformin, blood sugar with hypoglycemia managing with sliding scale insulin.  Recent Labs  Lab 11/13/22 0802 11/13/22 1152 11/13/22 1603 11/13/22 2118 11/14/22 0805  GLUCAP 116* 190* 225* 202* 119*   Hypertension BP stable on metoprolol Aldactone, continue the same Paroxysmal atrial fibrillation:One episode for a couple of seconds  in hospital once, none since. Cont home metoprolol Not on chronic AC  Drugs to avoid if you have Myasthenia Gravis:  Many drugs have been reported to have adverse effects in patients with MG (see below). However, not all patients react adversely to all these drugs. Conversely, not all "safe" drugs can be used with impunity in patients with MG.As a rule, the listed drugs should be avoided whenever possible, and patients with MG should be followed closely when any new drug is introduced.  Drugs that may exacerbate MG  Antibiotics  Aminoglycosides: e.g., streptomycin, tobramycin, kanamycin  Quinolones: e.g., ciprofloxacin, levofloxacin, ofloxacin, gatifloxacin  Macrolides: e.g., erythromycin, azithromycin, telithromycin  Nondepolarizing muscle relaxants for surgery  d-Tubocurarine (curare), pancuronium, vecuronium, atracurium  Beta-blocking agents  Propranolol, atenolol, metoprolol  Local anesthetics and related agents  Procaine, Xylocaine in large amounts  Procainamide (for arrhythmias)  Botulinum toxin  Botox exacerbates weakness.  Quinine derivatives  Quinine, quinidine, chloroquine, mefloquine ([place])  Magnesium  Decreases ACh release  Penicillamine  May cause MG  Drugs with important interactions in MG  Cyclosporine  Broad range of drug interactions, which may raise or lower cyclosporine levels  Azathioprine  Avoid allopurinol; combination may result in myelosuppression.  DVT prophylaxis: enoxaparin (LOVENOX) injection 40 mg Start: 11/12/22 1400 Code Status:   Code Status: DNR Family Communication: plan of care discussed with patient at bedside.  Daughter at the bedside 7 Patient status is: Inpatient because of myasthenia crisis Level of care: Progressive   Dispo: The patient is from: home with family            Anticipated disposition: home-once cleared by neurology on coming Saturday or Sunday  Objective: Vitals last 24 hrs: Vitals:   11/14/22 0914 11/14/22  0930  11/14/22 0945 11/14/22 0953  BP: 103/81 113/61 (!) 151/67 (!) 151/67  Pulse: 84 73 82 82  Resp: (!) '21 19 20   '$ Temp: 97.8 F (36.6 C) 97.7 F (36.5 C) 98 F (36.7 C)   TempSrc: Oral  Oral   SpO2: 100% 97% 98%   Weight:      Height:       Weight change:   Physical Examination: General exam: alert awake, pleasant, on room air.  HEENT:Oral mucosa moist, Ear/Nose WNL grossly Respiratory system: Bilaterally clear BS, no use of accessory muscle Cardiovascular system: S1 & S2 +, No JVD. Gastrointestinal system: Abdomen soft,NT,ND, BS+ Nervous System: Alert, awake, moving extremities, shefollows commands. Extremities: LE edema neg,distal peripheral pulses palpable.  Skin: No rashes,no icterus. MSK: Normal muscle bulk,tone, power   Medications reviewed:  Scheduled Meds:  aspirin  81 mg Oral Daily   cholestyramine  4 g Oral Q1200   enoxaparin (LOVENOX) injection  40 mg Subcutaneous Q24H   fenofibrate  160 mg Oral Daily   ferrous sulfate  325 mg Oral Daily   gabapentin  300 mg Oral TID   galantamine  8 mg Oral Daily   hydrOXYzine  25 mg Oral QHS   insulin aspart  0-15 Units Subcutaneous TID WC   levothyroxine  75 mcg Oral Q0600   melatonin  3 mg Oral QHS   metoprolol succinate  100 mg Oral Daily   nortriptyline  100 mg Oral QHS   pantoprazole  40 mg Oral BID   predniSONE  20 mg Oral Q breakfast   pyridostigmine  30 mg Oral BID   spironolactone  25 mg Oral QHS   Continuous Infusions:  Immune Globulin 10% 165 mL/hr at 11/14/22 0930   sodium chloride 500 mL (11/14/22 0802)   And   sodium chloride 500 mL (11/13/22 1239)    Diet Order             Diet Carb Modified Fluid consistency: Thin; Room service appropriate? Yes  Diet effective now                  Intake/Output Summary (Last 24 hours) at 11/14/2022 1040 Last data filed at 11/13/2022 1316 Gross per 24 hour  Intake 700.04 ml  Output --  Net 700.04 ml    Net IO Since Admission: 2,453.85 mL [11/14/22 1040]  Wt  Readings from Last 3 Encounters:  11/14/22 68.9 kg  10/30/22 69.9 kg  08/28/22 67.6 kg     Unresulted Labs (From admission, onward)     Start     Ordered   11/13/22 7902  Basic metabolic panel  Daily,   R      11/12/22 1153   11/13/22 0500  CBC  Daily,   R      11/12/22 1153          Data Reviewed: I have personally reviewed following labs and imaging studies CBC: Recent Labs  Lab 11/11/22 1627 11/12/22 0159 11/13/22 0310 11/14/22 0254  WBC 9.8 11.3* 8.1 6.9  HGB 14.8 14.3 13.9 12.1  HCT 45.7 43.5 42.4 38.5  MCV 90.9 90.8 89.8 92.5  PLT 257 234 198 409    Basic Metabolic Panel: Recent Labs  Lab 11/11/22 1627 11/12/22 0159 11/13/22 0310 11/14/22 0254  NA 137 140 139 137  K 4.3 3.8 4.2 3.9  CL 100 100 102 104  CO2 '24 31 28 27  '$ GLUCOSE 304* 127* 131* 190*  BUN '16 16 14 '$ 27*  CREATININE 1.25* 1.01* 1.00 1.04*  CALCIUM 10.1 10.1 10.0 9.6    Recent Results (from the past 240 hour(s))  Respiratory (~20 pathogens) panel by PCR     Status: None   Collection Time: 11/12/22 12:10 AM   Specimen: Nasopharyngeal Swab; Respiratory  Result Value Ref Range Status   Adenovirus NOT DETECTED NOT DETECTED Final   Coronavirus 229E NOT DETECTED NOT DETECTED Final    Comment: (NOTE) The Coronavirus on the Respiratory Panel, DOES NOT test for the novel  Coronavirus (2019 nCoV)    Coronavirus HKU1 NOT DETECTED NOT DETECTED Final   Coronavirus NL63 NOT DETECTED NOT DETECTED Final   Coronavirus OC43 NOT DETECTED NOT DETECTED Final   Metapneumovirus NOT DETECTED NOT DETECTED Final   Rhinovirus / Enterovirus NOT DETECTED NOT DETECTED Final   Influenza A NOT DETECTED NOT DETECTED Final   Influenza B NOT DETECTED NOT DETECTED Final   Parainfluenza Virus 1 NOT DETECTED NOT DETECTED Final   Parainfluenza Virus 2 NOT DETECTED NOT DETECTED Final   Parainfluenza Virus 3 NOT DETECTED NOT DETECTED Final   Parainfluenza Virus 4 NOT DETECTED NOT DETECTED Final   Respiratory Syncytial  Virus NOT DETECTED NOT DETECTED Final   Bordetella pertussis NOT DETECTED NOT DETECTED Final   Bordetella Parapertussis NOT DETECTED NOT DETECTED Final   Chlamydophila pneumoniae NOT DETECTED NOT DETECTED Final   Mycoplasma pneumoniae NOT DETECTED NOT DETECTED Final    Comment: Performed at [facility], [place]. [address] [place], [place] [address]    Antimicrobials: Anti-infectives (From admission, onward)    None      Culture/Microbiology    Component Value Date/Time   SDES URINE, CLEAN CATCH 05/31/2017 1051   SPECREQUEST NONE 05/31/2017 1051   CULT  05/31/2017 1051    NO GROWTH Performed at [facility], [place] [address] [place],  [address]    REPTSTATUS 06/01/2017 FINAL 05/31/2017 1051    Other culture-see note  Radiology Studies: No results found.   LOS: 2 days   Antonieta Pert, MD [facility]  11/14/2022, 10:40 AM

## 2022-11-14 NOTE — Progress Notes (Addendum)
Neurology Progress Note   S://  Patient was seen this morning. Patient reports improving pain when she takes the Tizanidine. She also reports aphasia throughout the day. She also endorses some diplopia during the day when she is watching tv. She reported an episode of diarrhea last night. She notes taking cholestyramine at home for diarrhea.    O:// Current vital signs: BP 138/65 (BP Location: Right Arm)   Pulse 73   Temp 97.7 F (36.5 C) (Oral)   Resp 18   Ht '5\' 1"'$  (1.549 m)   Wt 68.9 kg   SpO2 99%   BMI 28.70 kg/m  Vital signs in last 24 hours: Temp:  [97.7 F (36.5 C)-98.7 F (37.1 C)] 97.7 F (36.5 C) (02/08 0857) Pulse Rate:  [61-91] 73 (02/08 0857) Resp:  [8-22] 18 (02/08 0857) BP: (103-188)/(42-124) 138/65 (02/08 0857) SpO2:  [93 %-100 %] 99 % (02/08 0857) Weight:  [68.9 kg] 68.9 kg (02/08 0821)  GENERAL: Awake, alert in NAD HEENT: - Normocephalic and atraumatic, dry mm, no LN++, no Thyromegally LUNGS - Clear to auscultation bilaterally with no wheezes CV - S1S2 RRR, no m/r/g, equal pulses bilaterally. ABDOMEN - Soft, nontender, nondistended with normoactive BS Ext: warm, well perfused, intact peripheral pulses, no edema NEURO:  Mental Status: A&Ox3  Language: speech is normal initially but fluency got worse towards the end of assessment.  Naming, repetition, and comprehension intact. Cranial Nerves: PERRL. EOMI, visual fields full, diplopia present after prolonged upwards gaze, no nystagmus/ptosis, no facial asymmetry, facial sensation intact, hearing intact, tongue/uvula/soft palate midline, normal sternocleidomastoid and trapezius muscle strength.  Motor: fatigable weakness in the RLE but exam limited due to arthritic pain, 4/5 RUE, 5/5 strength in LUE, LLE Tone: is normal and bulk is normal Sensation- Intact to light touch bilaterally Coordination: FTN intact bilaterally, no ataxia in BLE. Gait- deferred    Medications  Current Facility-Administered  Medications:    acetaminophen (TYLENOL) tablet 650 mg, 650 mg, Oral, Q6H PRN, 650 mg at 11/14/22 0819 **OR** acetaminophen (TYLENOL) suppository 650 mg, 650 mg, Rectal, Q6H PRN, [name], [name], DO   aspirin chewable tablet 81 mg, 81 mg, Oral, Daily, [name], [name], DO, 81 mg at 11/13/22 6962   cholestyramine (QUESTRAN) packet 4 g, 4 g, Oral, Q1200, [name], [name], DO   enoxaparin (LOVENOX) injection 40 mg, 40 mg, Subcutaneous, Q24H, [name], [name], DO, 40 mg at 11/13/22 1310   fenofibrate tablet 160 mg, 160 mg, Oral, Daily, [name], [name], DO, 160 mg at 11/13/22 9528   ferrous sulfate tablet 325 mg, 325 mg, Oral, Daily, [name], [name], DO, 325 mg at 11/13/22 4132   gabapentin (NEURONTIN) capsule 300 mg, 300 mg, Oral, TID, [name], [name], DO, 300 mg at 11/13/22 2259   galantamine (RAZADYNE ER) 24 hr capsule 8 mg, 8 mg, Oral, Daily, [name], [name], DO   hydrALAZINE (APRESOLINE) injection 5-10 mg, 5-10 mg, Intravenous, Q4H PRN, [name], [name], DO   hydrOXYzine (ATARAX) tablet 25 mg, 25 mg, Oral, QHS, [name], [name], DO, 25 mg at 11/13/22 2259   Immune Globulin 10% (PRIVIGEN) IV infusion 25 g, 400 mg/kg (Adjusted), Intravenous, Q24 Hr x 5, Kc, Ramesh, MD, Last Rate: 41.3 mL/hr at 11/14/22 0857, Rate Change at 11/14/22 0857   insulin aspart (novoLOG) injection 0-15 Units, 0-15 Units, Subcutaneous, TID WC, [name], DO, 5 Units at 11/13/22 1659   ketorolac (TORADOL) 15 MG/ML injection 15 mg, 15 mg, Intravenous, Q6H PRN, Antonieta Pert, MD  levothyroxine (SYNTHROID) tablet 75 mcg, 75 mcg, Oral, Q0600, [name], DO, 75 mcg at 11/14/22 0544   melatonin tablet 3 mg, 3 mg, Oral, QHS, [name], [name], DO, 3 mg at 11/13/22 2211   metoprolol succinate (TOPROL-XL) 24 hr tablet 100 mg, 100 mg, Oral, Daily, Jennette Kettle M, DO, 100 mg at 11/13/22 6222   morphine (PF) 2 MG/ML injection 2 mg, 2 mg, Intravenous, Q4H PRN, Antonieta Pert, MD   nortriptyline (PAMELOR) capsule 100 mg, 100  mg, Oral, QHS, [name], [name], DO, 100 mg at 11/13/22 2211   ondansetron (ZOFRAN) tablet 4 mg, 4 mg, Oral, Q6H PRN **OR** ondansetron (ZOFRAN) injection 4 mg, 4 mg, Intravenous, Q6H PRN, [name], [name], DO   oxyCODONE (Oxy IR/ROXICODONE) immediate release tablet 2.5 mg, 2.5 mg, Oral, BID PRN, [name], DO, 2.5 mg at 11/13/22 1309   pantoprazole (PROTONIX) EC tablet 40 mg, 40 mg, Oral, BID, [name], [name], DO, 40 mg at 11/13/22 2211   predniSONE (DELTASONE) tablet 20 mg, 20 mg, Oral, Q breakfast, Jennette Kettle M, DO, 20 mg at 11/14/22 9798   pyridostigmine (MESTINON) tablet 30 mg, 30 mg, Oral, BID, Jennette Kettle M, DO, 30 mg at 11/13/22 1700   sodium chloride (OCEAN) 0.65 % nasal spray 1 spray, 1 spray, Each Nare, Q2H PRN, [name], DO   sodium chloride 0.9 % bolus 500 mL, 500 mL, Intravenous, Q24 Hr x 5, Last Rate: 999 mL/hr at 11/14/22 0802, 500 mL at 11/14/22 0802 **AND** sodium chloride 0.9 % bolus 500 mL, 500 mL, Intravenous, Q24 Hr x 5, Kc, Ramesh, MD, Last Rate: 999 mL/hr at 11/13/22 1239, 500 mL at 11/13/22 1239   spironolactone (ALDACTONE) tablet 25 mg, 25 mg, Oral, QHS, Jennette Kettle M, DO, 25 mg at 11/13/22 2211   tiZANidine (ZANAFLEX) tablet 2 mg, 2 mg, Oral, Q8H PRN, Antonieta Pert, MD, 2 mg at 11/14/22 0819  Labs CBC    Component Value Date/Time   WBC 6.9 11/14/2022 0254   RBC 4.16 11/14/2022 0254   HGB 12.1 11/14/2022 0254   HCT 38.5 11/14/2022 0254   PLT 175 11/14/2022 0254   MCV 92.5 11/14/2022 0254   MCH 29.1 11/14/2022 0254   MCHC 31.4 11/14/2022 0254   RDW 13.9 11/14/2022 0254   LYMPHSABS 3.0 05/27/2022 1925   MONOABS 0.5 05/27/2022 1925   EOSABS 0.8 (H) 05/27/2022 1925   BASOSABS 0.0 05/27/2022 1925    CMP     Component Value Date/Time   NA 137 11/14/2022 0254   K 3.9 11/14/2022 0254   CL 104 11/14/2022 0254   CO2 27 11/14/2022 0254   GLUCOSE 190 (H) 11/14/2022 0254   BUN 27 (H) 11/14/2022 0254   CREATININE 1.04 (H) 11/14/2022 0254    CREATININE 0.93 06/18/2016 1528   CALCIUM 9.6 11/14/2022 0254   PROT 6.3 (L) 05/27/2022 1925   ALBUMIN 3.6 05/27/2022 1925   AST 18 05/27/2022 1925   ALT 18 05/27/2022 1925   ALKPHOS 93 05/27/2022 1925   BILITOT 0.5 05/27/2022 1925   GFRNONAA 54 (L) 11/14/2022 0254   GFRAA >60 10/16/2018 1916    glycosylated hemoglobin  Lipid Panel     Component Value Date/Time   CHOL 206 (H) 05/27/2022 2238   TRIG 171 (H) 05/27/2022 2238   HDL 60 05/27/2022 2238   CHOLHDL 3.4 05/27/2022 2238   VLDL 34 05/27/2022 2238   LDLCALC 112 (H) 05/27/2022 2238    Respiratory Panel Negative   Imaging  CT-scan of the brain IMPRESSION: 1. No acute intracranial abnormality. 2. Generalized cerebral atrophy and microvascular disease changes of the supratentorial brain  CT lumbar spine  IMPRESSION: 1. No acute osseous injury within the lumbar spine. 2. 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 3. 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  Assessment:  Heidi [name] is a 82 y.o. female with a past medical history significant for hypertension, hyperlipidemia, diabetes, obstructive sleep apnea on CPAP, hypothyroidism, tachy/bradycardia syndrome s/p pacemaker, CKD stage III, neuropathy, mild cognitive impairment, seronegative myasthenia gravis (follows with Dr. Posey Pronto, EMG diagnostic)   Impression: Patient shows improving strength in her extremities. Will continue IVIG treatment and monitor for symptomatic improvement.  Impression: Possible myasthenia exacerbation  Recommendations: -Every 8 hour NIF and vital capacity testing -Continue home prednisone -Continue pyridostigmine 30 mg qAM and qPM as tolerated-will not increase because of diarrhea side effects -Continue IVIG 2 g/kg divided over 5 days (started 2/6), may be adjusted 4 hours earlier daily (q20 hours) to facilitate earlier discharge.  -500 cc Normal  saline pre and post for renal protection -Avoid meds listed below   Drugs to avoid if you have Myasthenia Gravis:   Many drugs have been reported to have adverse effects in patients with MG (see below). However, not all patients react adversely to all these drugs. Conversely, not all "safe" drugs can be used with impunity in patients with MG.As a rule, the listed drugs should be avoided whenever possible, and patients with MG should be followed closely when any new drug is introduced.  Drugs that may exacerbate MG  Antibiotics  Aminoglycosides: e.g., streptomycin, tobramycin, kanamycin  Quinolones: e.g., ciprofloxacin, levofloxacin, ofloxacin, gatifloxacin  Macrolides: e.g., erythromycin, azithromycin, telithromycin  Nondepolarizing muscle relaxants for surgery  d-Tubocurarine (curare), pancuronium, vecuronium, atracurium  Beta-blocking agents  Propranolol, atenolol, metoprolol  Local anesthetics and related agents  Procaine, Xylocaine in large amounts  Procainamide (for arrhythmias)  Botulinum toxin  Botox exacerbates weakness.  Quinine derivatives  Quinine, quinidine, chloroquine, mefloquine (Lariam)  Magnesium  Decreases ACh release  Penicillamine  May cause MG  Drugs with important interactions in MG  Cyclosporine  Broad range of drug interactions, which may raise or lower cyclosporine levels  Azathioprine  Avoid allopurinol; combination may result in myelosuppression.     Coralyn Pear, MD PGY-1 Psychiatry    Attending Neurohospitalist Addendum Patient seen and examined with APP/Resident. Agree with the history and physical as documented above. Agree with the plan as documented, which I helped formulate. I have independently reviewed the chart, obtained history, review of systems and examined the patient.I have personally reviewed pertinent head/neck/spine imaging (CT/MRI). Please feel free to call with any questions.  -- Amie Portland, MD Neurologist [facility] Pager: [phone]

## 2022-11-14 NOTE — Progress Notes (Signed)
VC .94 NIF -40

## 2022-11-14 NOTE — Evaluation (Signed)
Physical Therapy Evaluation Patient Details Name: Heidi [name] MRN: [id number] DOB: [birth year] Today's Date: 11/14/2022  History of Present Illness  82 yo female presents to ED on 2/5 with falls, weakness x1 week. PMH: MG, HTN, HLD, depression, GERD, glaucoma, atrial fibrillation not on anticoagulation, pacemaker, CAD, CKD, OSA, R TSA 04/2022.  Clinical Impression   Pt presents with generalized weakness worsening with fatigue, decreased activity tolerance, impaired standing balance with history of frequent falls. Pt to benefit from acute PT to address deficits. Pt ambulated short room distance, limited by LE fatigue and overall requiring light physical assist for mobility. PT discussed d/c option with pt and family, given lack of support at ALF PT recommending ST-SNF to return to PLOF. PT to progress mobility as tolerated, and will continue to follow acutely.          Recommendations for follow up therapy are one component of a multi-disciplinary discharge planning process, led by the attending physician.  Recommendations may be updated based on patient status, additional functional criteria and insurance authorization.  Follow Up Recommendations Skilled nursing-short term rehab (<3 hours/day) Can patient physically be transported by private vehicle: Yes    Assistance Recommended at Discharge Frequent or constant Supervision/Assistance  Patient can return home with the following  A little help with walking and/or transfers;A little help with bathing/dressing/bathroom    Equipment Recommendations None recommended by PT  Recommendations for Other Services       Functional Status Assessment Patient has had a recent decline in their functional status and demonstrates the ability to make significant improvements in function in a reasonable and predictable amount of time.     Precautions / Restrictions Precautions Precautions: Fall Restrictions Weight Bearing Restrictions: No       Mobility  Bed Mobility Overal bed mobility: Needs Assistance Bed Mobility: Supine to Sit     Supine to sit: Min assist, HOB elevated     General bed mobility comments: light trunk assist    Transfers Overall transfer level: Needs assistance Equipment used: Rolling walker (2 wheels) Transfers: Sit to/from Stand Sit to Stand: Min assist           General transfer comment: assist for power up and steadying, improper hand placement when rising    Ambulation/Gait Ambulation/Gait assistance: Min assist Gait Distance (Feet): 35 Feet Assistive device: Rolling walker (2 wheels) Gait Pattern/deviations: Step-through pattern, Decreased stride length, Trunk flexed Gait velocity: decr     General Gait Details: very increased time, periods of decreased toe clearance during stepping, steadying assist  Stairs            Wheelchair Mobility    Modified Rankin (Stroke Patients Only)       Balance Overall balance assessment: Needs assistance, History of Falls Sitting-balance support: No upper extremity supported, Feet supported Sitting balance-Leahy Scale: Good     Standing balance support: Bilateral upper extremity supported, During functional activity, Reliant on assistive device for balance Standing balance-Leahy Scale: Poor                               Pertinent Vitals/Pain Pain Assessment Pain Assessment: Faces Faces Pain Scale: Hurts even more Pain Location: RLE, sciactic Pain Descriptors / Indicators: Sore Pain Intervention(s): Limited activity within patient's tolerance, Monitored during session, Repositioned, Premedicated before session    Home Living Family/patient expects to be discharged to:: Assisted living  Home Equipment: Shower seat;Grab bars - tub/shower;Grab bars - toilet;Rollator (4 wheels);Wheelchair - manual;Hand held shower head      Prior Function Prior Level of Function : Needs assist;History of  Falls (last six months)             Mobility Comments: pt reports using rollator to get to/from dining hall, occasionally needs someone to push her in w/c. x5 recent falls ADLs Comments: indep prior to falling, was unable to shower with x5 falls in the past week     Hand Dominance   Dominant Hand: Right    Extremity/Trunk Assessment   Upper Extremity Assessment Upper Extremity Assessment: Defer to OT evaluation    Lower Extremity Assessment Lower Extremity Assessment: Generalized weakness    Cervical / Trunk Assessment Cervical / Trunk Assessment: Kyphotic  Communication   Communication: Other (comment) (expressive difficulties with fatigue)  Cognition Arousal/Alertness: Awake/alert Behavior During Therapy: WFL for tasks assessed/performed Overall Cognitive Status: Impaired/Different from baseline Area of Impairment: Safety/judgement, Problem solving                         Safety/Judgement: Decreased awareness of deficits   Problem Solving: Requires verbal cues, Requires tactile cues          General Comments      Exercises     Assessment/Plan    PT Assessment Patient needs continued PT services  PT Problem List Decreased strength;Decreased mobility;Decreased activity tolerance;Decreased balance;Decreased knowledge of use of DME;Pain;Decreased safety awareness       PT Treatment Interventions DME instruction;Therapeutic activities;Gait training;Therapeutic exercise;Balance training;Functional mobility training;Neuromuscular re-education;Patient/family education    PT Goals (Current goals can be found in the Care Plan section)  Acute Rehab PT Goals Patient Stated Goal: back to ALF eventually PT Goal Formulation: With patient Time For Goal Achievement: 11/28/22 Potential to Achieve Goals: Good    Frequency Min 2X/week     Co-evaluation               AM-PAC PT "6 Clicks" Mobility  Outcome Measure Help needed turning from your back  to your side while in a flat bed without using bedrails?: A Little Help needed moving from lying on your back to sitting on the side of a flat bed without using bedrails?: A Little Help needed moving to and from a bed to a chair (including a wheelchair)?: A Little Help needed standing up from a chair using your arms (e.g., wheelchair or bedside chair)?: A Little Help needed to walk in hospital room?: A Little Help needed climbing 3-5 steps with a railing? : A Lot 6 Click Score: 17    End of Session Equipment Utilized During Treatment: Gait belt Activity Tolerance: Patient tolerated treatment well Patient left: in bed;with call bell/phone within reach;with bed alarm set;Other (comment);with family/visitor present (sitting EOB) Nurse Communication: Mobility status PT Visit Diagnosis: Other abnormalities of gait and mobility (R26.89);Muscle weakness (generalized) (M62.81)    Time: 0272-5366 PT Time Calculation (min) (ACUTE ONLY): 24 min   Charges:   PT Evaluation $PT Eval Low Complexity: 1 Low PT Treatments $Therapeutic Activity: 8-22 mins       Stacie Glaze, PT DPT Acute Rehabilitation Services Pager [phone]  Office [phone]   Louis Matte 11/14/2022, 5:36 PM

## 2022-11-15 DIAGNOSIS — G7001 Myasthenia gravis with (acute) exacerbation: Secondary | ICD-10-CM | POA: Diagnosis not present

## 2022-11-15 LAB — HEMOGLOBIN A1C
Hgb A1c MFr Bld: 7.4 % — ABNORMAL HIGH (ref 4.8–5.6)
Mean Plasma Glucose: 165.68 mg/dL

## 2022-11-15 LAB — BASIC METABOLIC PANEL
Anion gap: 9 (ref 5–15)
BUN: 28 mg/dL — ABNORMAL HIGH (ref 8–23)
CO2: 23 mmol/L (ref 22–32)
Calcium: 9.1 mg/dL (ref 8.9–10.3)
Chloride: 105 mmol/L (ref 98–111)
Creatinine, Ser: 1.1 mg/dL — ABNORMAL HIGH (ref 0.44–1.00)
GFR, Estimated: 50 mL/min — ABNORMAL LOW (ref >60)
Glucose, Bld: 143 mg/dL — ABNORMAL HIGH (ref 70–99)
Potassium: 4.2 mmol/L (ref 3.5–5.1)
Sodium: 137 mmol/L (ref 135–145)

## 2022-11-15 LAB — CBC
HCT: 35.1 % — ABNORMAL LOW (ref 36.0–46.0)
Hemoglobin: 11.5 g/dL — ABNORMAL LOW (ref 12.0–15.0)
MCH: 29.9 pg (ref 26.0–34.0)
MCHC: 32.8 g/dL (ref 30.0–36.0)
MCV: 91.4 fL (ref 80.0–100.0)
Platelets: 161 10*3/uL (ref 150–400)
RBC: 3.84 MIL/uL — ABNORMAL LOW (ref 3.87–5.11)
RDW: 13.8 % (ref 11.5–15.5)
WBC: 5.7 10*3/uL (ref 4.0–10.5)
nRBC: 0 % (ref 0.0–0.2)

## 2022-11-15 LAB — GLUCOSE, CAPILLARY
Glucose-Capillary: 116 mg/dL — ABNORMAL HIGH (ref 70–99)
Glucose-Capillary: 195 mg/dL — ABNORMAL HIGH (ref 70–99)

## 2022-11-15 MED ORDER — HYDROCODONE-ACETAMINOPHEN 5-325 MG PO TABS
1.0000 | ORAL_TABLET | Freq: Four times a day (QID) | ORAL | Status: DC | PRN
  Administered 2022-11-15 – 2022-11-16 (×2): 1 via ORAL
  Filled 2022-11-15 (×2): qty 1

## 2022-11-15 MED ORDER — GLUCERNA SHAKE PO LIQD
237.0000 mL | Freq: Three times a day (TID) | ORAL | Status: DC
  Administered 2022-11-15 – 2022-11-16 (×4): 237 mL via ORAL

## 2022-11-15 NOTE — Progress Notes (Addendum)
Neurology Progress Note   S://  Patient seen this morning. Continues to reports muscle pain. Denies diarrhea yesterday.   O:// Current vital signs: BP (!) 157/102 (BP Location: Left Arm)   Pulse 92   Temp 98.5 F (36.9 C) (Oral)   Resp 14   Ht 5' 1"$  (1.549 m)   Wt 68.9 kg   SpO2 97%   BMI 28.70 kg/m  Vital signs in last 24 hours: Temp:  [97.5 F (36.4 C)-98.5 F (36.9 C)] 98.5 F (36.9 C) (02/09 0958) Pulse Rate:  [61-92] 92 (02/09 0958) Resp:  [13-20] 14 (02/09 0958) BP: (134-161)/(63-108) 157/102 (02/09 0958) SpO2:  [94 %-99 %] 97 % (02/09 0958)  GENERAL: Awake, alert in NAD HEENT: - Normocephalic and atraumatic, dry mm, no LN++, no Thyromegally LUNGS - Clear to auscultation bilaterally with no wheezes CV - S1S2 RRR, no m/r/g, equal pulses bilaterally. ABDOMEN - Soft, nontender, nondistended with normoactive BS Ext: warm, well perfused, intact peripheral pulses, no edema  NEURO:  Mental Status: A&Ox3  Language: Naming, repetition, and comprehension intact. Speech more fluent today. Cranial Nerves: PERRL. EOMI, visual fields full, diplopia present after prolonged upwards gaze, no nystagmus/ptosis, no facial asymmetry, facial sensation intact, hearing intact, tongue/uvula/soft palate midline, normal sternocleidomastoid and trapezius muscle strength.  Motor: 5/5 strength in upper and lower extremities Tone: is normal and bulk is normal Sensation- Intact to light touch bilaterally Coordination: FTN intact bilaterally, no ataxia in BLE. Gait- deferred    Medications  Current Facility-Administered Medications:    acetaminophen (TYLENOL) tablet 650 mg, 650 mg, Oral, Q6H PRN, 650 mg at 11/15/22 0927 **OR** acetaminophen (TYLENOL) suppository 650 mg, 650 mg, Rectal, Q6H PRN, [name], [name], DO   aspirin chewable tablet 81 mg, 81 mg, Oral, Daily, [name], [name], DO, 81 mg at 11/15/22 [id number]   cholestyramine (QUESTRAN) packet 4 g, 4 g, Oral, Q1200, [name], DO,  4 g at 11/14/22 1115   cyanocobalamin (VITAMIN B12) tablet 1,000 mcg, 1,000 mcg, Oral, Daily, Kc, Ramesh, MD, 1,000 mcg at 11/15/22 0924   enoxaparin (LOVENOX) injection 40 mg, 40 mg, Subcutaneous, Q24H, [name], [name], DO, 40 mg at 11/14/22 1410   fenofibrate tablet 160 mg, 160 mg, Oral, Daily, [name], [name], DO, 160 mg at 11/15/22 [id number]   ferrous sulfate tablet 325 mg, 325 mg, Oral, Daily, [name], [name], DO, 325 mg at 11/15/22 [id number]   gabapentin (NEURONTIN) capsule 300 mg, 300 mg, Oral, TID, [name], [name], DO, 300 mg at 11/15/22 [id number]   galantamine (RAZADYNE ER) 24 hr capsule 8 mg, 8 mg, Oral, Daily, [name], [name], DO   hydrALAZINE (APRESOLINE) injection 5-10 mg, 5-10 mg, Intravenous, Q4H PRN, [name], [name], DO   hydrOXYzine (ATARAX) tablet 25 mg, 25 mg, Oral, QHS, [name], [name], DO, 25 mg at 11/14/22 2104   Immune Globulin 10% (PRIVIGEN) IV infusion 25 g, 400 mg/kg (Adjusted), Intravenous, Q24 Hr x 5, Kc, Ramesh, MD, Last Rate: 165 mL/hr at 11/15/22 0955, Rate Change at 11/15/22 0955   insulin aspart (novoLOG) injection 0-15 Units, 0-15 Units, Subcutaneous, TID WC, [name], DO, 3 Units at 11/14/22 1631   ketorolac (TORADOL) 15 MG/ML injection 15 mg, 15 mg, Intravenous, Q6H PRN, Kc, Ramesh, MD, 15 mg at 11/15/22 0813   levothyroxine (SYNTHROID) tablet 75 mcg, 75 mcg, Oral, Q0600, [name], DO, 75 mcg at 11/15/22 0510   melatonin tablet 3 mg, 3 mg, Oral, QHS, [name], [name], DO, 3 mg at  11/14/22 2105   metoprolol succinate (TOPROL-XL) 24 hr tablet 100 mg, 100 mg, Oral, Daily, Jennette Kettle M, DO, 100 mg at 11/15/22 [id number]   morphine (PF) 2 MG/ML injection 2 mg, 2 mg, Intravenous, Q4H PRN, Kc, Ramesh, MD   nortriptyline (PAMELOR) capsule 100 mg, 100 mg, Oral, QHS, [name], [name], DO, 100 mg at 11/14/22 2105   ondansetron (ZOFRAN) tablet 4 mg, 4 mg, Oral, Q6H PRN **OR** ondansetron (ZOFRAN) injection 4 mg, 4 mg, Intravenous, Q6H PRN, [name], [name], DO    oxyCODONE (Oxy IR/ROXICODONE) immediate release tablet 2.5 mg, 2.5 mg, Oral, BID PRN, [name], DO, 2.5 mg at 11/13/22 1309   pantoprazole (PROTONIX) EC tablet 40 mg, 40 mg, Oral, BID, Jennette Kettle M, DO, 40 mg at 11/15/22 [id number]   predniSONE (DELTASONE) tablet 20 mg, 20 mg, Oral, Q breakfast, Jennette Kettle M, DO, 20 mg at 11/15/22 0813   pyridostigmine (MESTINON) tablet 30 mg, 30 mg, Oral, BID, Jennette Kettle M, DO, 30 mg at 11/15/22 [id number]   sodium chloride (OCEAN) 0.65 % nasal spray 1 spray, 1 spray, Each Nare, Q2H PRN, [name], DO   sodium chloride 0.9 % bolus 500 mL, 500 mL, Intravenous, Q24 Hr x 5, Last Rate: 999 mL/hr at 11/15/22 0815, 500 mL at 11/15/22 0815 **AND** sodium chloride 0.9 % bolus 500 mL, 500 mL, Intravenous, Q24 Hr x 5, Kc, Ramesh, MD, Last Rate: 999 mL/hr at 11/14/22 1112, 500 mL at 11/14/22 1112   spironolactone (ALDACTONE) tablet 25 mg, 25 mg, Oral, QHS, Jennette Kettle M, DO, 25 mg at 11/14/22 2105   tiZANidine (ZANAFLEX) tablet 2 mg, 2 mg, Oral, Q8H PRN, Kc, Ramesh, MD, 2 mg at 11/15/22 0256  Labs CBC    Component Value Date/Time   WBC 5.7 11/15/2022 0317   RBC 3.84 (L) 11/15/2022 0317   HGB 11.5 (L) 11/15/2022 0317   HCT 35.1 (L) 11/15/2022 0317   PLT 161 11/15/2022 0317   MCV 91.4 11/15/2022 0317   MCH 29.9 11/15/2022 0317   MCHC 32.8 11/15/2022 0317   RDW 13.8 11/15/2022 0317   LYMPHSABS 3.0 05/27/2022 1925   MONOABS 0.5 05/27/2022 1925   EOSABS 0.8 (H) 05/27/2022 1925   BASOSABS 0.0 05/27/2022 1925    CMP     Component Value Date/Time   NA 137 11/15/2022 0317   K 4.2 11/15/2022 0317   CL 105 11/15/2022 0317   CO2 23 11/15/2022 0317   GLUCOSE 143 (H) 11/15/2022 0317   BUN 28 (H) 11/15/2022 0317   CREATININE 1.10 (H) 11/15/2022 0317   CREATININE 0.93 06/18/2016 1528   CALCIUM 9.1 11/15/2022 0317   PROT 6.3 (L) 05/27/2022 1925   ALBUMIN 3.6 05/27/2022 1925   AST 18 05/27/2022 1925   ALT 18 05/27/2022 1925   ALKPHOS 93 05/27/2022  1925   BILITOT 0.5 05/27/2022 1925   GFRNONAA 50 (L) 11/15/2022 0317   GFRAA >60 10/16/2018 1916    glycosylated hemoglobin  Lipid Panel     Component Value Date/Time   CHOL 206 (H) 05/27/2022 2238   TRIG 171 (H) 05/27/2022 2238   HDL 60 05/27/2022 2238   CHOLHDL 3.4 05/27/2022 2238   VLDL 34 05/27/2022 2238   LDLCALC 112 (H) 05/27/2022 2238    Respiratory Panel Negative   Imaging  CT-scan of the brain IMPRESSION: 1. No acute intracranial abnormality. 2. Generalized cerebral atrophy and microvascular disease changes of the supratentorial brain  CT lumbar spine  IMPRESSION: 1. No acute  osseous injury within the lumbar spine. 2. 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 3. 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  Assessment:  Heidi [name] is a 82 y.o. female with a past medical history significant for hypertension, hyperlipidemia, diabetes, obstructive sleep apnea on CPAP, hypothyroidism, tachy/bradycardia syndrome s/p pacemaker, CKD stage III, neuropathy, mild cognitive impairment, seronegative myasthenia gravis (follows with Dr. Posey Pronto, EMG diagnostic)   Impression: Patient continues to improve in strength in her lower extremities and less limited to pain.  Impression: Possible myasthenia exacerbation  Recommendations: -Every 8 hour NIF and vital capacity testing -Continue home prednisone -Continue pyridostigmine 30 mg qAM and qPM as tolerated-will not increase because of diarrhea side effects -Continue IVIG 2 g/kg divided over 5 days (started 2/6), may be adjusted 4 hours earlier daily (q20 hours) to facilitate earlier discharge.  -500 cc Normal saline pre and post for renal protection -Avoid meds listed below   Drugs to avoid if you have Myasthenia Gravis:   Many drugs have been reported to have adverse effects in patients with MG (see below). However, not all patients  react adversely to all these drugs. Conversely, not all "safe" drugs can be used with impunity in patients with MG.As a rule, the listed drugs should be avoided whenever possible, and patients with MG should be followed closely when any new drug is introduced.  Drugs that may exacerbate MG  Antibiotics  Aminoglycosides: e.g., streptomycin, tobramycin, kanamycin  Quinolones: e.g., ciprofloxacin, levofloxacin, ofloxacin, gatifloxacin  Macrolides: e.g., erythromycin, azithromycin, telithromycin  Nondepolarizing muscle relaxants for surgery  d-Tubocurarine (curare), pancuronium, vecuronium, atracurium  Beta-blocking agents  Propranolol, atenolol, metoprolol  Local anesthetics and related agents  Procaine, Xylocaine in large amounts  Procainamide (for arrhythmias)  Botulinum toxin  Botox exacerbates weakness.  Quinine derivatives  Quinine, quinidine, chloroquine, mefloquine (Lariam)  Magnesium  Decreases ACh release  Penicillamine  May cause MG  Drugs with important interactions in MG  Cyclosporine  Broad range of drug interactions, which may raise or lower cyclosporine levels  Azathioprine  Avoid allopurinol; combination may result in myelosuppression.     Coralyn Pear, MD PGY-1 Psychiatry      Attending Neurohospitalist Addendum Patient seen and examined with APP/Resident. Agree with the history and physical as documented above. Agree with the plan as documented, which I helped formulate. I have independently reviewed the chart, obtained history, review of systems and examined the patient.I have personally reviewed pertinent head/neck/spine imaging (CT/MRI). Please feel free to call with any questions.  -- Amie Portland, MD Neurologist [facility] Pager: [phone]

## 2022-11-15 NOTE — Progress Notes (Signed)
Initial Nutrition Assessment  DOCUMENTATION CODES:   Not applicable  INTERVENTION:   - Glucerna Shake po TID, each supplement provides 220 kcal and 10 grams of protein  - Mighty Shake BID with lunch and dinner meals, each supplement provides 330 kcals and 9 grams of protein  NUTRITION DIAGNOSIS:   Increased nutrient needs related to acute illness as evidenced by estimated needs.  GOAL:   Patient will meet greater than or equal to 90% of their needs  MONITOR:   PO intake, Supplement acceptance, Labs, Weight trends  REASON FOR ASSESSMENT:   Consult Assessment of nutrition requirement/status  ASSESSMENT:   82 year old female who presented to the ED on 2/05 with back pain and multiple falls. PMH of T2DM, GERD, HTN, HLD, OSA on CPAP, hypothyroidism, tachy/bradycardia syndrome s/p pacemaker, CKD stage III, neuropathy, mild cognitive impairment, seronegative myasthenia gravis. Pt admitted with myasthenia crisis.  Spoke with pt's daughter at bedside. Pt sleeping at time of RD visit. Pt's daughter reports concern regarding pt experiencing fatigue while eating later in the day and asking about oral nutrition supplement options. Noted pt had consumed ~75% of lunch meal tray that included burger, baked potato chips, and ice cream.  Pt's daughter is not sure how pt has been eating at ALF where she resides. She does know that pt reported increase in appetite and weight gain after she started on prednisone. Reviewed weight history in chart. Weight trending up over the last 6-7 months.  Discussed plan for Glucerna supplements between meals given pt has consistently been eating >/= 50% of meals. Discussed that pt can consume regular Ensure or Boost in place of a meal if needed to provide adequate kcal and protein. RD to add [employer] with lunch and dinner meals as another supplement alternative.  Meal Completion: 60-100%  Medications reviewed and include: questran, vitamin B12 1000 mcg  daily, fenofibrate, ferrous sulfate, SSI, melatonin, protonix, prednisone, spironolactone, IVIG  Labs reviewed: BUN 28, creatinine 1.10, hemoglobin A1C 7.4 CBG's: 116-195 x 24 hours  NUTRITION - FOCUSED PHYSICAL EXAM:  Deferred.  Diet Order:   Diet Order             Diet Carb Modified Fluid consistency: Thin; Room service appropriate? Yes  Diet effective now                   EDUCATION NEEDS:   Education needs have been addressed  Skin:  Skin Assessment: Reviewed RN Assessment  Last BM:  11/13/22 smear  Height:   Ht Readings from Last 1 Encounters:  11/11/22 5' 1"$  (1.549 m)    Weight:   Wt Readings from Last 1 Encounters:  11/14/22 68.9 kg    BMI:  Body mass index is 28.7 kg/m.  Estimated Nutritional Needs:   Kcal:  1650-1850  Protein:  80-95 grams  Fluid:  1.6-1.8 L    Gustavus Bryant, MS, RD, LDN Inpatient Clinical Dietitian Please see AMiON for contact information.

## 2022-11-15 NOTE — Progress Notes (Signed)
NIF >-40 VC 1.18L

## 2022-11-15 NOTE — NC FL2 (Signed)
[place] LEVEL OF CARE FORM     IDENTIFICATION  Patient Name: Heidi [name] Birthdate: Jun 24, 1941 Sex: female Admission Date (Current Location): 11/11/2022  [facility] and [place] Number:  Herbalist and Address:  [facility] [place], [place] [address] [place], [place] [address]      Provider Number: [id number]  Attending Physician Name and Address:  Antonieta Pert, MD  Relative Name and Phone Number:       Current Level of Care: Hospital Recommended Level of Care: [place] Prior Approval Number:    Date Approved/Denied:   PASRR Number: [id number]  Discharge Plan: SNF    Current Diagnoses: Patient Active Problem List   Diagnosis Date Noted   Myasthenic crisis ([place]) 11/12/2022   Stroke-like symptoms 05/30/2022   Hypomagnesemia 05/28/2022   Ataxia 05/28/2022   Falls, initial encounter 05/27/2022   TIA (transient ischemic attack) 05/26/2022   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   HLD (hyperlipidemia) 05/30/2017   GERD (gastroesophageal reflux disease) 05/30/2017   UTI (urinary tract infection) [id number]   Acute metabolic encephalopathy Q000111Q   Type II diabetes mellitus with renal manifestations ([place]) 05/30/2017   Hypothyroidism 05/30/2017   Depression 05/30/2017   CAD (coronary artery disease) 05/30/2017   CKD (chronic kidney disease), stage III (HCC) 05/30/2017   Chronic diastolic CHF (congestive heart failure) ([place]) 05/30/2017   Dementia with behavioral disturbance ([place]) 05/30/2017   Essential hypertension, benign 06/15/2014   Allergic reaction 04/28/2012   Tachycardia-bradycardia ([place]) 12/24/2011   Pacemaker-mdt 12/24/2011   HTN (hypertension) 12/24/2011   A-fib ([place]) 11/07/2011    Orientation RESPIRATION BLADDER Height & Weight     Self, Time, Situation, Place  Normal Continent Weight: 151 lb 14.4 oz (68.9 kg) Height:  5' 1"$  (154.9 cm)  BEHAVIORAL SYMPTOMS/MOOD  NEUROLOGICAL BOWEL NUTRITION STATUS      Continent Diet (please see discharge summary)  AMBULATORY STATUS COMMUNICATION OF NEEDS Skin     Verbally Normal                       Personal Care Assistance Level of Assistance  Bathing, Feeding, Dressing Bathing Assistance: Limited assistance Feeding assistance: Independent Dressing Assistance: Limited assistance     Functional Limitations Info  Sight, Hearing, Speech Sight Info: Impaired Hearing Info: Impaired Speech Info: Adequate    SPECIAL CARE FACTORS FREQUENCY  PT (By licensed PT), OT (By licensed OT)     PT Frequency: 5x per week OT Frequency: 5x per week            Contractures Contractures Info: Not present    Additional Factors Info  Code Status, Allergies Code Status Info: DNR Allergies Info: Crestor,effexor,aricept,latex,nadril, pork derived products,hibiclens,lipitor,lisinopril,pencillins,plavix,simvastin,sulfa antibiotics,tape,Sulfonamide Derivatives           Current Medications (11/15/2022):  This is the current hospital active medication list Current Facility-Administered Medications  Medication Dose Route Frequency Provider Last Rate Last Admin   acetaminophen (TYLENOL) tablet 650 mg  650 mg Oral Q6H PRN [name], DO   650 mg at 11/15/22 [id number]   Or   acetaminophen (TYLENOL) suppository 650 mg  650 mg Rectal Q6H PRN [name], DO       aspirin chewable tablet 81 mg  81 mg Oral Daily Jennette Kettle M, DO   81 mg at 11/15/22 [id number]   cholestyramine (QUESTRAN) packet 4 g  4 g Oral Q1200 [name], DO   4  g at 11/15/22 1109   cyanocobalamin (VITAMIN B12) tablet 1,000 mcg  1,000 mcg Oral Daily Kc, Maren Beach, MD   1,000 mcg at 11/15/22 0924   enoxaparin (LOVENOX) injection 40 mg  40 mg Subcutaneous Q24H Jennette Kettle M, DO   40 mg at 11/14/22 1410   fenofibrate tablet 160 mg  160 mg Oral Daily [name], DO   160 mg at 11/15/22 [id number]   ferrous sulfate tablet 325 mg  325 mg Oral Daily  [name], DO   325 mg at 11/15/22 [id number]   gabapentin (NEURONTIN) capsule 300 mg  300 mg Oral TID [name], DO   300 mg at 11/15/22 [id number]   galantamine (RAZADYNE ER) 24 hr capsule 8 mg  8 mg Oral Daily Jennette Kettle M, DO       hydrALAZINE (APRESOLINE) injection 5-10 mg  5-10 mg Intravenous Q4H PRN [name], DO       HYDROcodone-acetaminophen (NORCO/VICODIN) 5-325 MG per tablet 1 tablet  1 tablet Oral Q6H PRN Antonieta Pert, MD       hydrOXYzine (ATARAX) tablet 25 mg  25 mg Oral QHS Jennette Kettle M, DO   25 mg at 11/14/22 2104   Immune Globulin 10% (PRIVIGEN) IV infusion 25 g  400 mg/kg (Adjusted) Intravenous Q24 Hr x 5 Kc, Ramesh, MD   Stopped at 11/15/22 1135   insulin aspart (novoLOG) injection 0-15 Units  0-15 Units Subcutaneous TID WC [name], DO   3 Units at 11/15/22 1140   ketorolac (TORADOL) 15 MG/ML injection 15 mg  15 mg Intravenous Q6H PRN Antonieta Pert, MD   15 mg at 11/15/22 0813   levothyroxine (SYNTHROID) tablet 75 mcg  75 mcg Oral Q0600 [name], DO   75 mcg at 11/15/22 0510   melatonin tablet 3 mg  3 mg Oral QHS Jennette Kettle M, DO   3 mg at 11/14/22 2105   metoprolol succinate (TOPROL-XL) 24 hr tablet 100 mg  100 mg Oral Daily Jennette Kettle M, DO   100 mg at 11/15/22 [id number]   morphine (PF) 2 MG/ML injection 2 mg  2 mg Intravenous Q4H PRN Antonieta Pert, MD       nortriptyline (PAMELOR) capsule 100 mg  100 mg Oral QHS Jennette Kettle M, DO   100 mg at 11/14/22 2105   ondansetron (ZOFRAN) tablet 4 mg  4 mg Oral Q6H PRN [name], DO       Or   ondansetron [facility]) injection 4 mg  4 mg Intravenous Q6H PRN [name], DO       pantoprazole (PROTONIX) EC tablet 40 mg  40 mg Oral BID Jennette Kettle M, DO   40 mg at 11/15/22 [id number]   predniSONE (DELTASONE) tablet 20 mg  20 mg Oral Q breakfast [name], DO   20 mg at 11/15/22 0813   pyridostigmine (MESTINON) tablet 30 mg  30 mg Oral BID Jennette Kettle M, DO   30 mg at 11/15/22 [id number]   sodium  chloride (OCEAN) 0.65 % nasal spray 1 spray  1 spray Each Nare Q2H PRN [name], DO       sodium chloride 0.9 % bolus 500 mL  500 mL Intravenous Q24 Hr x 5 Kc, Ramesh, MD 999 mL/hr at 11/15/22 0815 500 mL at 11/15/22 0815   And   [COMPLETED] sodium chloride 0.9 % bolus 500 mL  500 mL Intravenous Q24 Hr x 5 Kc, Maren Beach, MD  999 mL/hr at 11/15/22 1142 500 mL at 11/15/22 1142   spironolactone (ALDACTONE) tablet 25 mg  25 mg Oral QHS Jennette Kettle M, DO   25 mg at 11/14/22 2105   tiZANidine (ZANAFLEX) tablet 2 mg  2 mg Oral Q8H PRN Antonieta Pert, MD   2 mg at 11/15/22 1107     Discharge Medications: Please see discharge summary for a list of discharge medications.  Relevant Imaging Results:  Relevant Lab Results:   Additional Information SSN [id number]  Vinie Sill, LCSW

## 2022-11-15 NOTE — TOC Progression Note (Signed)
Transition of Care [facility]) - Progression Note    Patient Details  Name: Heidi [name] MRN: [id number] Date of Birth: December 08, 1940  Transition of Care [facility]) CM/SW [place], [place] Phone Number: 11/15/2022, 5:23 PM  Clinical Narrative:     Received insurance authorization for [name] # [id number]- PTAR approval remains pending. CSW sent message to patient's daughter, of approval and informed transportation still pending- informed family may have to transport to SNF.   SNF updated on potential d/c tomorrow.  TOC will continue to follow and assist with discharge planning.   Thurmond Butts, MSW, LCSW Clinical Social Worker      Expected Discharge Plan: Skilled Nursing Facility Barriers to Discharge: Continued Medical Work up  Expected Discharge Plan and Services In-house Referral: Clinical Social Work                                             Social Determinants of Health (SDOH) Interventions SDOH Screenings   Food Insecurity: No Food Insecurity (11/12/2022)  Housing: Low Risk  (11/12/2022)  Transportation Needs: No Transportation Needs (11/12/2022)  Utilities: Not At Risk (11/12/2022)  Tobacco Use: Medium Risk (11/11/2022)    Readmission Risk Interventions     No data to display

## 2022-11-15 NOTE — TOC Initial Note (Signed)
Transition of Care [facility]) - Initial/Assessment Note    Patient Details  Name: Heidi [name] MRN: [id number] Date of Birth: 1941-05-20  Transition of Care [facility]) CM/SW Contact:    Vinie Sill, LCSW Phone Number: 11/15/2022, 11:00 AM  Clinical Narrative:                  CSW spoke with patient's daughter,[name]. CSW introduced self and explained role. She confirmed patient is resident at [facility]. She also informed she is aware of PT/OT recommendation of short term rehab at [facility]. She agrees with recommendation. CSW explained the SNF process. Preferred SNF choice is [facility]. CSW was given permission to send to other SNFs in the area as back up choices. All question answered.   TOC will provide bed offer once available. TOC will continue to follow and assist with discharge planning.  Thurmond Butts, MSW, LCSW Clinical Social Worker    Expected Discharge Plan: Skilled Nursing Facility Barriers to Discharge: Insurance Authorization, SNF Pending bed offer   Patient Goals and CMS Choice            Expected Discharge Plan and Services In-house Referral: Clinical Social Work                                            Prior Living Arrangements/Services   Lives with:: Self Patient language and need for interpreter reviewed:: No        Need for Family Participation in Patient Care: Yes (Comment) Care giver support system in place?: Yes (comment)      Activities of Daily Living Home Assistive Devices/Equipment: Walker (specify type) ADL Screening (condition at time of admission) Patient's cognitive ability adequate to safely complete daily activities?: Yes Is the patient deaf or have difficulty hearing?: Yes Does the patient have difficulty seeing, even when wearing glasses/contacts?: No Does the patient have difficulty concentrating, remembering, or making decisions?: No Patient able to express need for assistance with ADLs?: Yes Does the  patient have difficulty dressing or bathing?: Yes Independently performs ADLs?: No Does the patient have difficulty walking or climbing stairs?: Yes Weakness of Legs: Both Weakness of Arms/Hands: None  Permission Sought/Granted                  Emotional Assessment       Orientation: : Oriented to Self, Oriented to Place, Oriented to  Time, Oriented to Situation Alcohol / Substance Use: Not Applicable Psych Involvement: No (comment)  Admission diagnosis:  Myasthenic crisis ([place]) [G70.01] Frequent falls [R29.6] Weakness of both lower extremities [R29.898] Acute midline low back pain without sciatica [M54.50] Patient Active Problem List   Diagnosis Date Noted   Myasthenic crisis ([place]) 11/12/2022   Stroke-like symptoms 05/30/2022   Hypomagnesemia 05/28/2022   Ataxia 05/28/2022   Falls, initial encounter 05/27/2022   TIA (transient ischemic attack) 05/26/2022   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   HLD (hyperlipidemia) 05/30/2017   GERD (gastroesophageal reflux disease) 05/30/2017   UTI (urinary tract infection) Q000111Q   Acute metabolic encephalopathy Q000111Q   Type II diabetes mellitus with renal manifestations ([place]) 05/30/2017   Hypothyroidism 05/30/2017   Depression 05/30/2017   CAD (coronary artery disease) 05/30/2017   CKD (chronic kidney disease), stage III ([place]) 05/30/2017   Chronic diastolic CHF (congestive heart failure) ([place]) 05/30/2017   Dementia with behavioral disturbance ([place])  05/30/2017   Essential hypertension, benign 06/15/2014   Allergic reaction 04/28/2012   Tachycardia-bradycardia ([place]) 12/24/2011   Pacemaker-mdt 12/24/2011   HTN (hypertension) 12/24/2011   A-fib ([place]) 11/07/2011   PCP:  Earmon Phoenix, NP Pharmacy:   [facility], [place] [address] Phone: [phone] Fax: [phone]     Social Determinants of Health (SDOH) Social  History: [place]: No Food Insecurity (11/12/2022)  Housing: Low Risk  (11/12/2022)  Transportation Needs: No Transportation Needs (11/12/2022)  Utilities: Not At Risk (11/12/2022)  Tobacco Use: Medium Risk (11/11/2022)   SDOH Interventions:     Readmission Risk Interventions     No data to display

## 2022-11-15 NOTE — Progress Notes (Signed)
NIF >-40 VC 1.18 Pt had [employer], Denies SOB, no increased WOB.

## 2022-11-15 NOTE — TOC Progression Note (Signed)
Transition of Care [facility]) - Progression Note    Patient Details  Name: Heidi [name] MRN: [id number] Date of Birth: Sep 09, 1941  Transition of Care [facility]) CM/SW [place], [place] Phone Number: 11/15/2022, 2:29 PM  Clinical Narrative:     [facility] has confirmed bed offer- CSW requested HTA start authorization for insurance and PTAR.  SNF can admit over the weekend , if [facility] is received and patient is stable.  Thurmond Butts, MSW, LCSW Clinical Social Worker    Expected Discharge Plan: Skilled Nursing Facility Barriers to Discharge: Ship broker, Continued Medical Work up  Expected Discharge Plan and Services In-house Referral: Clinical Social Work                                             Social Determinants of Health (SDOH) Interventions SDOH Screenings   Food Insecurity: No Food Insecurity (11/12/2022)  Housing: Low Risk  (11/12/2022)  Transportation Needs: No Transportation Needs (11/12/2022)  Utilities: Not At Risk (11/12/2022)  Tobacco Use: Medium Risk (11/11/2022)    Readmission Risk Interventions     No data to display

## 2022-11-15 NOTE — Progress Notes (Signed)
PROGRESS NOTE Heidi [name]  [id number] DOB: [birth year] DOA: 11/11/2022 PCP: Earmon Phoenix, NP   Brief Narrative/Hospital Course: 82 y.o. 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 She had no fevers, nausea, vomiting,has been having some diarrhea from mestinone.  No urinary symptoms. Currently on prednisone and mestinone. Seen in the ED, hemodynamically stable on room air afebrile Labs with creatinine 1.2 otherwise fairly stable CBC and BMP UA negative for UTI, respiratory virus panel negative underwent CT head and CT lumbar spine-multifactorial DJD no acute finding,, Generalized cerebral atrophy.  Neurology was consulted admitted for myasthenia crisis-started  on IVIG dose close 6/24   Subjective: Seen and examined this morning, resting comfortably Also complains of ongoing muscle pain previously relieved with Toradol but not this morning Overnight afebrile, normotensive  Labs reviewed stable BMP CBC   Assessment and Plan: Principal Problem:   Myasthenic crisis ([place]) Active Problems:   A-fib ([place])   HTN (hypertension)   Type II diabetes mellitus with renal manifestations ([place])   Hypothyroidism   CKD (chronic kidney disease), stage III ([place])  Myasthenia crisis: Appreciate neurology input.  Patient overall doing well but complains of muscle pain> on Toradol which is not helping today add hydrocodone, on IV morphine, may need muscle relaxant if okay with neurology. Continue to monitor NIF/vital capacity every 8 hr pr RT.Continue IVIG #4/5 w/ 500 cc normal saline pre and post infusion for renal protection., predinsone 20 mg, pyridostigmine (dose increase limited due to diarrhea).Continue to monitor neuro-status closely  Mild dysphagia requested speech eval  CKD stage IIIa creatinine stable and at baseline.  Monitor while getting IVIG Hypothyroidism  continue on Synthroid.  Clinically euthyroid Type 2 diabetes mellitus A1c still well at 7.4, holding metformin, blood sugar overall stable continue SSI  Recent Labs  Lab 11/13/22 2118 11/14/22 0805 11/14/22 1152 11/14/22 1624 11/14/22 2112 11/15/22 0317  GLUCAP 202* 119* 215* 188* 142*  --   HGBA1C  --   --   --   --   --  7.4*   Hypertension BP well-controlled, continue metoprolol Aldactone Paroxysmal atrial fibrillation:One episode for a couple of seconds in hospital once, none since. Cont home metoprolol Not on chronic AC  Drugs to avoid if you have Myasthenia Gravis:  Many drugs have been reported to have adverse effects in patients with MG (see below). However, not all patients react adversely to all these drugs. Conversely, not all "safe" drugs can be used with impunity in patients with MG.As a rule, the listed drugs should be avoided whenever possible, and patients with MG should be followed closely when any new drug is introduced.  Drugs that may exacerbate MG  Antibiotics  Aminoglycosides: e.g., streptomycin, tobramycin, kanamycin  Quinolones: e.g., ciprofloxacin, levofloxacin, ofloxacin, gatifloxacin  Macrolides: e.g., erythromycin, azithromycin, telithromycin  Nondepolarizing muscle relaxants for surgery  d-Tubocurarine (curare), pancuronium, vecuronium, atracurium  Beta-blocking agents  Propranolol, atenolol, metoprolol  Local anesthetics and related agents  Procaine, Xylocaine in large amounts  Procainamide (for arrhythmias)  Botulinum toxin  Botox exacerbates weakness.  Quinine derivatives  Quinine, quinidine, chloroquine, mefloquine ([place])  Magnesium  Decreases ACh release  Penicillamine  May cause MG  Drugs with important interactions in MG  Cyclosporine  Broad range of drug interactions, which may raise or lower cyclosporine levels  Azathioprine  Avoid allopurinol; combination may result in myelosuppression.  DVT prophylaxis: enoxaparin (  LOVENOX)  injection 40 mg Start: 11/12/22 1400 Code Status:   Code Status: DNR Family Communication: plan of care discussed with patient at bedside.  Daughter at the bedside 2/7 7 Patient status is: Inpatient because of myasthenia crisis Level of care: Progressive   Dispo: The patient is from: home with family            Anticipated disposition: home-once cleared by neurology on coming Saturday or Sunday  Objective: Vitals last 24 hrs: Vitals:   11/14/22 2300 11/15/22 0257 11/15/22 0810 11/15/22 0812  BP: (!) 145/63   (!) 151/88  Pulse: 61 68  72  Resp: 15 13  17  Temp:  97.6 F (36.4 C) 97.7 F (36.5 C)   TempSrc: Oral Oral Oral   SpO2: 94% 96%  96%  Weight:      Height:       Weight change:   Physical Examination: General exam: alert awake oriented, pleasant, on room air. HEENT:Oral mucosa moist, Ear/Nose WNL grossly Respiratory system: Bilaterally clear BS,no use of accessory muscle Cardiovascular system: S1 & S2 +, No JVD. Gastrointestinal system: Abdomen soft,NT,ND, BS+ Nervous System: Alert, awake, moving extremities, nonfocal and follows commands. Extremities: LE edema neg,distal peripheral pulses palpable.  Skin: No rashes,no icterus. MSK: Normal muscle bulk,tone, power  Medications reviewed:  Scheduled Meds:  aspirin  81 mg Oral Daily   cholestyramine  4 g Oral Q1200   cyanocobalamin  1,000 mcg Oral Daily   enoxaparin (LOVENOX) injection  40 mg Subcutaneous Q24H   fenofibrate  160 mg Oral Daily   ferrous sulfate  325 mg Oral Daily   gabapentin  300 mg Oral TID   galantamine  8 mg Oral Daily   hydrOXYzine  25 mg Oral QHS   insulin aspart  0-15 Units Subcutaneous TID WC   levothyroxine  75 mcg Oral Q0600   melatonin  3 mg Oral QHS   metoprolol succinate  100 mg Oral Daily   nortriptyline  100 mg Oral QHS   pantoprazole  40 mg Oral BID   predniSONE  20 mg Oral Q breakfast   pyridostigmine  30 mg Oral BID   spironolactone  25 mg Oral QHS   Continuous Infusions:   Immune Globulin 10% Stopped (11/14/22 1108)   sodium chloride 500 mL (11/15/22 0815)   And   sodium chloride 500 mL (11/14/22 1112)    Diet Order             Diet Carb Modified Fluid consistency: Thin; Room service appropriate? Yes  Diet effective now                  Intake/Output Summary (Last 24 hours) at 11/15/2022 0853 Last data filed at 11/14/2022 2120 Gross per 24 hour  Intake 1090 ml  Output 300 ml  Net 790 ml   Net IO Since Admission: 3,243.85 mL [11/15/22 0853]  Wt Readings from Last 3 Encounters:  11/14/22 68.9 kg  10/30/22 69.9 kg  08/28/22 67.6 kg    Unresulted Labs (From admission, onward)     Start     Ordered   11/13/22 0500  Basic metabolic panel  Daily,   R      11/12/22 1153   11/13/22 0500  CBC  Daily,   R      02$ /06/24 1153          Data Reviewed: I have personally reviewed following labs and imaging studies CBC: Recent Labs  Lab 11/11/22 1627 11/12/22 0159  11/13/22 0310 11/14/22 0254 11/15/22 0317  WBC 9.8 11.3* 8.1 6.9 5.7  HGB 14.8 14.3 13.9 12.1 11.5*  HCT 45.7 43.5 42.4 38.5 35.1*  MCV 90.9 90.8 89.8 92.5 91.4  PLT 257 234 198 175 Q000111Q    Basic Metabolic Panel: Recent Labs  Lab 11/11/22 1627 11/12/22 0159 11/13/22 0310 11/14/22 0254 11/15/22 0317  NA 137 140 139 137 137  K 4.3 3.8 4.2 3.9 4.2  CL 100 100 102 104 105  CO2 24 31 28 27 23  $ GLUCOSE 304* 127* 131* 190* 143*  BUN 16 16 14 $ 27* 28*  CREATININE 1.25* 1.01* 1.00 1.04* 1.10*  CALCIUM 10.1 10.1 10.0 9.6 9.1    Recent Results (from the past 240 hour(s))  Respiratory (~20 pathogens) panel by PCR     Status: None   Collection Time: 11/12/22 12:10 AM   Specimen: Nasopharyngeal Swab; Respiratory  Result Value Ref Range Status   Adenovirus NOT DETECTED NOT DETECTED Final   Coronavirus 229E NOT DETECTED NOT DETECTED Final    Comment: (NOTE) The Coronavirus on the Respiratory Panel, DOES NOT test for the novel  Coronavirus (2019 nCoV)    Coronavirus HKU1 NOT  DETECTED NOT DETECTED Final   Coronavirus NL63 NOT DETECTED NOT DETECTED Final   Coronavirus OC43 NOT DETECTED NOT DETECTED Final   Metapneumovirus NOT DETECTED NOT DETECTED Final   Rhinovirus / Enterovirus NOT DETECTED NOT DETECTED Final   Influenza A NOT DETECTED NOT DETECTED Final   Influenza B NOT DETECTED NOT DETECTED Final   Parainfluenza Virus 1 NOT DETECTED NOT DETECTED Final   Parainfluenza Virus 2 NOT DETECTED NOT DETECTED Final   Parainfluenza Virus 3 NOT DETECTED NOT DETECTED Final   Parainfluenza Virus 4 NOT DETECTED NOT DETECTED Final   Respiratory Syncytial Virus NOT DETECTED NOT DETECTED Final   Bordetella pertussis NOT DETECTED NOT DETECTED Final   Bordetella Parapertussis NOT DETECTED NOT DETECTED Final   Chlamydophila pneumoniae NOT DETECTED NOT DETECTED Final   Mycoplasma pneumoniae NOT DETECTED NOT DETECTED Final    Comment: Performed at [facility], [place] [address] [place], [place] [address]    Antimicrobials: Anti-infectives (From admission, onward)    None      Culture/Microbiology    Component Value Date/Time   SDES URINE, CLEAN CATCH 05/31/2017 1051   SPECREQUEST NONE 05/31/2017 1051   CULT  05/31/2017 1051    NO GROWTH Performed at [facility], [place] [address], [place], [place] [address]    REPTSTATUS 06/01/2017 FINAL 05/31/2017 1051    Other culture-see note  Radiology Studies: No results found.   LOS: 3 days   Antonieta Pert, MD [facility]  11/15/2022, 8:53 AM

## 2022-11-15 NOTE — Social Work (Signed)
CSW called patient's daughter, [name], left voice message to return call.   Thurmond Butts, MSW, LCSW Clinical Social Worker

## 2022-11-15 NOTE — Evaluation (Signed)
Occupational Therapy Evaluation Patient Details Name: Heidi [name] MRN: [id number] DOB: Feb 06, 1941 Today's Date: 11/15/2022   History of Present Illness 82 yo female presents to ED on 2/5 with falls, weakness x1 week. PMH: MG, HTN, HLD, depression, GERD, glaucoma, atrial fibrillation not on anticoagulation, pacemaker, CAD, CKD, OSA, R TSA 04/2022.   Clinical Impression   Pt admitted with the above diagnoses and presents with below problem list. Pt will benefit from continued acute OT to address the below listed deficits and maximize independence with basic ADLs prior to d/c to venue below. At baseline, pt is mod I with basic ADLs, uses rollator vs w/c to access the dining hall at her ALF. Pt presents with decreased activity tolerance, generalized weakness, and impaired balance impacting current assist levels. Pt currently needs up to mod A with UB/LB ADLs and functional transfers.       Recommendations for follow up therapy are one component of a multi-disciplinary discharge planning process, led by the attending physician.  Recommendations may be updated based on patient status, additional functional criteria and insurance authorization.   Follow Up Recommendations  Skilled nursing-short term rehab (<3 hours/day)     Assistance Recommended at Discharge Frequent or constant Supervision/Assistance  Patient can return home with the following A little help with walking and/or transfers;A little help with bathing/dressing/bathroom    Functional Status Assessment  Patient has had a recent decline in their functional status and demonstrates the ability to make significant improvements in function in a reasonable and predictable amount of time.  Equipment Recommendations  None recommended by OT    Recommendations for Other Services       Precautions / Restrictions Precautions Precautions: Fall Restrictions Weight Bearing Restrictions: No      Mobility Bed Mobility Overal bed mobility:  Needs Assistance Bed Mobility: Supine to Sit, Sit to Supine     Supine to sit: Min assist, HOB elevated Sit to supine: Min guard   General bed mobility comments: steadying assist    Transfers Overall transfer level: Needs assistance Equipment used: Rolling walker (2 wheels) Transfers: Sit to/from Stand, Bed to chair/wheelchair/BSC Sit to Stand: Min assist     Step pivot transfers: Min guard, Min assist     General transfer comment: EOB <>BSC. steadying assist.      Balance Overall balance assessment: Needs assistance, History of Falls Sitting-balance support: No upper extremity supported, Feet supported Sitting balance-Leahy Scale: Good     Standing balance support: Bilateral upper extremity supported, During functional activity, Reliant on assistive device for balance Standing balance-Leahy Scale: Poor                             ADL either performed or assessed with clinical judgement   ADL Overall ADL's : Needs assistance/impaired Eating/Feeding: Set up;Sitting   Grooming: Minimal assistance;Sitting   Upper Body Bathing: Moderate assistance;Sitting   Lower Body Bathing: Moderate assistance;Sit to/from stand   Upper Body Dressing : Moderate assistance;Sitting   Lower Body Dressing: Moderate assistance;Sit to/from stand   Toilet Transfer: Minimal assistance   Toileting- Clothing Manipulation and Hygiene: Moderate assistance Toileting - Clothing Manipulation Details (indicate cue type and reason): min A to steady, total A for pericare in standing       General ADL Comments: Pt completed toilet transfer and pericare. Extra time and effort, increased pain with movement.     Vision         Perception  Praxis      Pertinent Vitals/Pain Pain Assessment Pain Assessment: Faces Faces Pain Scale: Hurts even more Pain Location: back and RLE Pain Descriptors / Indicators: Aching, Sore Pain Intervention(s): Limited activity within patient's  tolerance, Monitored during session, Repositioned     Hand Dominance Right   Extremity/Trunk Assessment Upper Extremity Assessment Upper Extremity Assessment: Generalized weakness;RUE deficits/detail RUE Deficits / Details: s/p R TSA 04/2022   Lower Extremity Assessment Lower Extremity Assessment: Defer to PT evaluation   Cervical / Trunk Assessment Cervical / Trunk Assessment: Kyphotic   Communication Communication Communication: Other (comment) (expressive difficulties with fatigue)   Cognition Arousal/Alertness: Awake/alert Behavior During Therapy: WFL for tasks assessed/performed Overall Cognitive Status: Impaired/Different from baseline Area of Impairment: Safety/judgement, Problem solving                         Safety/Judgement: Decreased awareness of deficits   Problem Solving: Requires verbal cues, Requires tactile cues       General Comments  daughter present during session    Exercises     Shoulder Instructions      Home Living Family/patient expects to be discharged to:: Assisted living                             Home Equipment: Shower seat;Grab bars - tub/shower;Grab bars - toilet;Rollator (4 wheels);Wheelchair - manual;Hand held shower head          Prior Functioning/Environment Prior Level of Function : Needs assist;History of Falls (last six months)       Physical Assist : Mobility (physical)     Mobility Comments: pt reports using rollator to get to/from dining hall, occasionally needs someone to push her in w/c. x5 recent falls ADLs Comments: indep prior to falling, was unable to shower with x5 falls in the past week        OT Problem List: Decreased strength;Decreased activity tolerance;Impaired balance (sitting and/or standing);Decreased knowledge of use of DME or AE;Decreased knowledge of precautions;Pain      OT Treatment/Interventions: Self-care/ADL training;Therapeutic exercise;DME and/or AE  instruction;Balance training;Patient/family education;Therapeutic activities    OT Goals(Current goals can be found in the care plan section) Acute Rehab OT Goals Patient Stated Goal: reduce pain OT Goal Formulation: With patient/family Time For Goal Achievement: 11/29/22 Potential to Achieve Goals: Good ADL Goals Pt Will Perform Grooming: with set-up;with supervision;sitting;standing Pt Will Perform Upper Body Dressing: with set-up;with supervision;sitting Pt Will Perform Lower Body Dressing: with min guard assist;sit to/from stand Pt Will Transfer to Toilet: with min guard assist;ambulating Pt Will Perform Toileting - Clothing Manipulation and hygiene: with min assist;sit to/from stand  OT Frequency: Min 2X/week    Co-evaluation              AM-PAC OT "6 Clicks" Daily Activity     Outcome Measure Help from another person eating meals?: A Little Help from another person taking care of personal grooming?: A Little Help from another person toileting, which includes using toliet, bedpan, or urinal?: A Lot Help from another person bathing (including washing, rinsing, drying)?: A Lot Help from another person to put on and taking off regular upper body clothing?: A Little Help from another person to put on and taking off regular lower body clothing?: A Little 6 Click Score: 16   End of Session Equipment Utilized During Treatment: Rolling walker (2 wheels) Nurse Communication: Other (comment) (nurse present for part of session)  Activity Tolerance: Patient limited by pain;Patient limited by fatigue Patient left: in bed;with call bell/phone within reach;with family/visitor present  OT Visit Diagnosis: Unsteadiness on feet (R26.81);Muscle weakness (generalized) (M62.81);History of falling (Z91.81);Pain Pain - Right/Left: Right Pain - part of body: Leg;Knee                Time: [id number] OT Time Calculation (min): 25 min Charges:  OT General Charges $OT Visit: 1 Visit OT  Evaluation $OT Eval Moderate Complexity: 1 Mod OT Treatments $Self Care/Home Management : 8-22 mins  Tyrone Schimke, OT Acute Rehabilitation Services Office: [phone]   Hortencia Pilar 11/15/2022, 12:40 PM

## 2022-11-15 NOTE — Evaluation (Signed)
Clinical/Bedside Swallow Evaluation Patient Details  Name: Heidi [name] MRN: [id number] Date of Birth: 1941/07/26  Today's Date: 11/15/2022 Time: SLP Start Time (ACUTE ONLY): 0930 SLP Stop Time (ACUTE ONLY): 1000 SLP Time Calculation (min) (ACUTE ONLY): 30 min  Past Medical History:  Past Medical History:  Diagnosis Date   Anxiety    Atrial fibrillation ([place])    Intermittent   CAD (coronary artery disease)    non obstructive   CKD (chronic kidney disease)    Diabetes mellitus without complication (HCC)    Dysrhythmia    PAF   GERD (gastroesophageal reflux disease)    Glaucoma    [place]   Heart murmur    HTN (hypertension)    Hyperlipidemia    Hypothyroid    Major depression    10-17-2010: hospitalized for suicidal, [name] (D/C 10-30-2010)   Migraine headache    OSA (obstructive sleep apnea)    CPAP nightly   Peripheral neuropathy    Presence of permanent cardiac pacemaker    Seasonal allergies    Sleep apnea    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    ABDOMINAL HYSTERECTOMY  1976   APPENDECTOMY     BREAST EXCISIONAL BIOPSY     BREAST SURGERY     fibroids removed, benign   CARDIAC CATHETERIZATION  02-12-2006   Minor irregularities: RCA-mid 40%, LM-normal, LAD-normal. EF 65%.   CHOLECYSTECTOMY     ECTOPIC PREGNANCY SURGERY     treatment x3, last one 11-02-2009 at [facility]   goiter     goiter removed     MASS EXCISION Right 10/18/2015   Procedure: EXCISION OF 6CM MASS ON RIGHT  NECK/BACK;  Surgeon: [name], MD;  Location: [place];  Service: General;  Laterality: Right;   PACEMAKER GENERATOR CHANGE N/A 01/15/2012   Procedure: PACEMAKER GENERATOR CHANGE;  Surgeon: Deboraha Sprang, MD;  [facility] CATH LAB;   Service: Cardiovascular;  Laterality: N/A;   PACEMAKER INSERTION     Permanent. [employer] EnRhyth 04/2006, set as DDDR   REVERSE SHOULDER ARTHROPLASTY Right 05/01/2022   Procedure: REVERSE SHOULDER ARTHROPLASTY;  Surgeon: Hiram Gash, MD;  Location: [facility] OR[facility];  Service: Orthopedics;  Laterality: Right;   TUMOR REMOVAL  2000   Fibroid from breast x4-most recent   WRIST FRACTURE SURGERY  11-2007   right, had metal plate inserted   HPI:  Heidi [name] is a 82 y.o. female with seronegative myasthenia gravis; came to ED w/ o BLE weakness for past 1 week, Increasing falls at home, diarrhea. So far NIF has remained WNL, pt improving. At some point pt demonstrated/reported dysarthria and dysphagia. CXR clear.  Pt with a past medical history significant for hypertension, hyperlipidemia, diabetes, obstructive sleep apnea on CPAP, hypothyroidism, tachy/bradycardia syndrome s/p pacemaker, CKD stage III, neuropathy, mild cognitive impairment.    Assessment / Plan / Recommendation  Clinical Impression  Pt demonstrates no signs of aspiration; she was observed with her breakfast meal and was able to masticate and swallow without complaint. Pt had some difficulty with meat at dinner yesterday and suspect fatigue is affecting function in the evening. SLP reviewed energy conservation strategies, signs of syaphgia and compensatory strategies appropriate for MG. Daughter listening and can help with carry over. Daughter also  had MG and is aware of strategies. Pt to continue regular diet and thin liquids. Recommend dietitian consult for access to easy to consume calorie dense nutrition supplements pt can drink when too fatigued to masticate solids. SLP will sign off. SLP Visit Diagnosis: Dysphagia, unspecified (R13.10)    Aspiration Risk  Risk for inadequate nutrition/hydration;Mild aspiration risk    Diet Recommendation Regular;Thin liquid   Liquid Administration via: Cup;Straw Medication Administration: Whole meds  with liquid Supervision: Patient able to self feed Compensations: Minimize environmental distractions Postural Changes: Seated upright at 90 degrees    Other  Recommendations      Recommendations for follow up therapy are one component of a multi-disciplinary discharge planning process, led by the attending physician.  Recommendations may be updated based on patient status, additional functional criteria and insurance authorization.  Follow up Recommendations No SLP follow up      Assistance Recommended at Discharge    Functional Status Assessment    Frequency and Duration            Prognosis        Swallow Study   General HPI: Heidi [name] is a 82 y.o. female with seronegative myasthenia gravis; came to ED w/ o BLE weakness for past 1 week, Increasing falls at home, diarrhea. So far NIF has remained WNL, pt improving. At some point pt demonstrated/reported dysarthria and dysphagia. CXR clear.  Pt with a past medical history significant for hypertension, hyperlipidemia, diabetes, obstructive sleep apnea on CPAP, hypothyroidism, tachy/bradycardia syndrome s/p pacemaker, CKD stage III, neuropathy, mild cognitive impairment. Type of Study: Bedside Swallow Evaluation Previous Swallow Assessment: 2023 - WNL Diet Prior to this Study: Regular;Thin liquids (Level 0) Temperature Spikes Noted: No Respiratory Status: Room air History of Recent Intubation: No Behavior/Cognition: Alert;Cooperative;Pleasant mood Oral Cavity Assessment: Within Functional Limits Oral Care Completed by SLP: No Oral Cavity - Dentition: Adequate natural dentition Vision: Functional for self-feeding Self-Feeding Abilities: Able to feed self Patient Positioning: Upright in bed Baseline Vocal Quality: Normal Volitional Cough: Strong Volitional Swallow: Able to elicit    Oral/Motor/Sensory Function Overall Oral Motor/Sensory Function: Within functional limits   Ice Chips     Thin Liquid Thin Liquid: Within  functional limits Presentation: Cup;Self Fed    Nectar Thick Nectar Thick Liquid: Not tested   Honey Thick Honey Thick Liquid: Not tested   Puree Puree: Not tested   Solid     Solid: Within functional limits      Gadiel John, Katherene Ponto 11/15/2022,12:19 PM

## 2022-11-15 NOTE — TOC Progression Note (Signed)
Transition of Care [facility]) - Progression Note    Patient Details  Name: Heidi [name] MRN: [id number] Date of Birth: 06-Feb-1941  Transition of Care [facility]) CM/SW Contact  Vinie Sill, [place] Phone Number: 11/15/2022, 12:01 PM  Clinical Narrative:     CSW reached spoke with Soy @ [facility]- informed family is interested in [facility]- waiting on determination.  Thurmond Butts, MSW, LCSW Clinical Social Worker    Expected Discharge Plan: Skilled Nursing Facility Barriers to Discharge: Insurance Authorization, SNF Pending bed offer  Expected Discharge Plan and Services In-house Referral: Clinical Social Work                                             Social Determinants of Health (SDOH) Interventions SDOH Screenings   Food Insecurity: No Food Insecurity (11/12/2022)  Housing: Low Risk  (11/12/2022)  Transportation Needs: No Transportation Needs (11/12/2022)  Utilities: Not At Risk (11/12/2022)  Tobacco Use: Medium Risk (11/11/2022)    Readmission Risk Interventions     No data to display

## 2022-11-16 DIAGNOSIS — G7001 Myasthenia gravis with (acute) exacerbation: Secondary | ICD-10-CM | POA: Diagnosis not present

## 2022-11-16 LAB — BASIC METABOLIC PANEL
Anion gap: 6 (ref 5–15)
BUN: 30 mg/dL — ABNORMAL HIGH (ref 8–23)
CO2: 23 mmol/L (ref 22–32)
Calcium: 9 mg/dL (ref 8.9–10.3)
Chloride: 107 mmol/L (ref 98–111)
Creatinine, Ser: 1.06 mg/dL — ABNORMAL HIGH (ref 0.44–1.00)
GFR, Estimated: 53 mL/min — ABNORMAL LOW (ref >60)
Glucose, Bld: 198 mg/dL — ABNORMAL HIGH (ref 70–99)
Potassium: 4.2 mmol/L (ref 3.5–5.1)
Sodium: 136 mmol/L (ref 135–145)

## 2022-11-16 LAB — CBC
HCT: 34.6 % — ABNORMAL LOW (ref 36.0–46.0)
Hemoglobin: 11.3 g/dL — ABNORMAL LOW (ref 12.0–15.0)
MCH: 29.9 pg (ref 26.0–34.0)
MCHC: 32.7 g/dL (ref 30.0–36.0)
MCV: 91.5 fL (ref 80.0–100.0)
Platelets: 160 10*3/uL (ref 150–400)
RBC: 3.78 MIL/uL — ABNORMAL LOW (ref 3.87–5.11)
RDW: 14 % (ref 11.5–15.5)
WBC: 5.8 10*3/uL (ref 4.0–10.5)
nRBC: 0 % (ref 0.0–0.2)

## 2022-11-16 LAB — GLUCOSE, CAPILLARY
Glucose-Capillary: 111 mg/dL — ABNORMAL HIGH (ref 70–99)
Glucose-Capillary: 206 mg/dL — ABNORMAL HIGH (ref 70–99)

## 2022-11-16 MED ORDER — SODIUM CHLORIDE 0.9 % IV BOLUS
500.0000 mL | Freq: Once | INTRAVENOUS | Status: AC
  Administered 2022-11-16: 500 mL via INTRAVENOUS

## 2022-11-16 MED ORDER — ALUM & MAG HYDROXIDE-SIMETH 200-200-20 MG/5ML PO SUSP
30.0000 mL | Freq: Once | ORAL | Status: AC
  Administered 2022-11-16: 30 mL via ORAL
  Filled 2022-11-16: qty 30

## 2022-11-16 MED ORDER — OXYCODONE HCL 5 MG PO TABS: 2.5000 mg | ORAL_TABLET | Freq: Two times a day (BID) | ORAL | 0 refills | Status: DC | PRN

## 2022-11-16 NOTE — Progress Notes (Signed)
[facility] here to transport patient to SNF.

## 2022-11-16 NOTE — Progress Notes (Signed)
NIF >-40 VC 1.34L

## 2022-11-16 NOTE — TOC Transition Note (Signed)
Transition of Care [facility]) - CM/SW Discharge Note   Patient Details  Name: Heidi [name] MRN: [id number] Date of Birth: [birth year]  Transition of [facility]) CM/SW Contact:  Amador Cunas, [place] Phone Number: 11/16/2022, 2:49 PM   Clinical Narrative:  Pt for dc to [facility]. [employer] auth received for SNF and PTAR. Pt and pt's dtr aware of dc and report agreeable. Confirmed with Soy at [facility] they are prepared to admit pt to room 704. RN provided with number for report and PTAR arranged for transport. SW signing off at dc.  Wandra Feinstein, MSW, LCSW [phone] (coverage)       Final next level of care: Skilled Nursing Facility Barriers to Discharge: No Barriers Identified   Patient Goals and CMS Choice CMS Medicare.gov Compare Post Acute Care list provided to:: Patient Choice offered to / list presented to : Patient, Adult Children  Discharge Placement                Patient chooses bed at: [facility] Patient to be transferred to facility by: [facility] Name of family member notified: [name]/dtr Patient and family notified of of transfer: 11/16/22  Discharge Plan and Services Additional resources added to the After Visit Summary for   In-house Referral: Clinical Social Work                                   Social Determinants of Health ([place]) Interventions SDOH Screenings   Food Insecurity: No Food Insecurity (11/12/2022)  Housing: Low Risk  (11/12/2022)  Transportation Needs: No Transportation Needs (11/12/2022)  Utilities: Not At Risk (11/12/2022)  Tobacco Use: Medium Risk (11/11/2022)     Readmission Risk Interventions     No data to display

## 2022-11-16 NOTE — Progress Notes (Signed)
Neurology Progress Note   S:// Patient seen this morning. Continues to reports muscle pain.  O:// Current vital signs: BP (!) 147/75 (BP Location: Left Arm)   Pulse 67   Temp 98.4 F (36.9 C) (Oral)   Resp 15   Ht 5' 1"$  (1.549 [name])   Wt 68.9 kg   SpO2 91%   BMI 28.70 kg/[name]  Vital signs in last 24 hours: Temp:  [97.5 F (36.4 C)-98.5 F (36.9 C)] 98.4 F (36.9 C) (02/10 0303) Pulse Rate:  [60-92] 67 (02/10 0702) Resp:  [13-19] 15 (02/10 0702) BP: (134-183)/(50-108) 147/75 (02/10 0702) SpO2:  [91 %-99 %] 91 % (02/10 0702) GENERAL: Awake, alert in NAD HEENT: - Normocephalic and atraumatic, dry mm, no LN++, no Thyromegally LUNGS - Clear to auscultation bilaterally with no wheezes CV - S1S2 RRR, no [name]/r/g, equal pulses bilaterally. ABDOMEN - Soft, nontender, nondistended with normoactive BS Ext: warm, well perfused, intact peripheral pulses, no edema  NEURO:  Mental Status: A&Ox3  Language: Naming, repetition, and comprehension intact. Speech more fluent today. Cranial Nerves: PERRL. EOMI, visual fields full, no diplopia today,no nystagmus/ptosis, no facial asymmetry, facial sensation intact, hearing intact, tongue/uvula/soft palate midline, normal sternocleidomastoid and trapezius muscle strength.  Motor: 5/5 strength in upper and lower extremities Tone: is normal and bulk is normal Sensation- Intact to light touch bilaterally Coordination: FTN intact bilaterally, no ataxia in BLE. Gait- deferred  Medications  Current Facility-Administered Medications:    acetaminophen (TYLENOL) tablet 650 mg, 650 mg, Oral, Q6H PRN, 650 mg at 11/15/22 0927 **OR** acetaminophen (TYLENOL) suppository 650 mg, 650 mg, Rectal, Q6H PRN, [name], Heidi M, DO   aspirin chewable tablet 81 mg, 81 mg, Oral, Daily, [name], Heidi M, DO, 81 mg at 11/16/22 [id number]   cholestyramine (QUESTRAN) packet 4 g, 4 g, Oral, Q1200, Heidi Quill, DO, 4 g at 11/15/22 1109   cyanocobalamin (VITAMIN B12) tablet 1,000  mcg, 1,000 mcg, Oral, Daily, [name], Ramesh, MD, 1,000 mcg at 11/16/22 0839   enoxaparin (LOVENOX) injection 40 mg, 40 mg, Subcutaneous, Q24H, [name], Heidi M, DO, 40 mg at 11/15/22 1532   feeding supplement (GLUCERNA SHAKE) (GLUCERNA SHAKE) liquid 237 mL, 237 mL, Oral, TID BM, [name], Ramesh, MD, 237 mL at 11/16/22 0841   fenofibrate tablet 160 mg, 160 mg, Oral, Daily, [name], Heidi M, DO, 160 mg at 11/16/22 0840   ferrous sulfate tablet 325 mg, 325 mg, Oral, Daily, [name], Heidi M, DO, 325 mg at 11/16/22 [id number]   gabapentin (NEURONTIN) capsule 300 mg, 300 mg, Oral, TID, [name], Heidi M, DO, 300 mg at 11/16/22 [id number]   galantamine (RAZADYNE ER) 24 hr capsule 8 mg, 8 mg, Oral, Daily, [name], Heidi M, DO, 8 mg at 11/16/22 0840   hydrALAZINE (APRESOLINE) injection 5-10 mg, 5-10 mg, Intravenous, Q4H PRN, [name], Heidi M, DO   HYDROcodone-acetaminophen (NORCO/VICODIN) 5-325 MG per tablet 1 tablet, 1 tablet, Oral, Q6H PRN, [name], Ramesh, MD, 1 tablet at 11/15/22 2111   hydrOXYzine (ATARAX) tablet 25 mg, 25 mg, Oral, QHS, [name], Heidi M, DO, 25 mg at 11/15/22 2111   Immune Globulin 10% (PRIVIGEN) IV infusion 25 g, 400 mg/kg (Adjusted), Intravenous, Q24 Hr x 5, [name], Ramesh, MD, Stopped at 11/15/22 1135   insulin aspart (novoLOG) injection 0-15 Units, 0-15 Units, Subcutaneous, TID WC, Heidi Quill, DO, 5 Units at 11/15/22 1657   ketorolac (TORADOL) 15 MG/ML injection 15 mg, 15 mg, Intravenous, Q6H PRN, [name], Ramesh, MD, 15 mg at 11/15/22 1657   levothyroxine (SYNTHROID) tablet  75 mcg, 75 mcg, Oral, Q0600, Heidi Quill, DO, 75 mcg at 11/16/22 0617   melatonin tablet 3 mg, 3 mg, Oral, QHS, [name], Heidi M, DO, 3 mg at 11/15/22 2111   metoprolol succinate (TOPROL-XL) 24 hr tablet 100 mg, 100 mg, Oral, Daily, Jennette Kettle M, DO, 100 mg at 11/16/22 [id number]   morphine (PF) 2 MG/ML injection 2 mg, 2 mg, Intravenous, Q4H PRN, Antonieta Pert, MD   nortriptyline (PAMELOR) capsule 100 mg, 100 mg, Oral, QHS, [name], Heidi  M, DO, 100 mg at 11/15/22 2111   ondansetron (ZOFRAN) tablet 4 mg, 4 mg, Oral, Q6H PRN **OR** ondansetron (ZOFRAN) injection 4 mg, 4 mg, Intravenous, Q6H PRN, [name], Heidi M, DO   pantoprazole (PROTONIX) EC tablet 40 mg, 40 mg, Oral, BID, [name], Heidi M, DO, 40 mg at 11/16/22 0840   predniSONE (DELTASONE) tablet 20 mg, 20 mg, Oral, Q breakfast, Jennette Kettle M, DO, 20 mg at 11/16/22 [id number]   pyridostigmine (MESTINON) tablet 30 mg, 30 mg, Oral, BID, Jennette Kettle M, DO, 30 mg at 11/16/22 0840   sodium chloride (OCEAN) 0.65 % nasal spray 1 spray, 1 spray, Each Nare, Q2H PRN, Heidi Quill, DO   spironolactone (ALDACTONE) tablet 25 mg, 25 mg, Oral, QHS, [name], Heidi M, DO, 25 mg at 11/15/22 2111   tiZANidine (ZANAFLEX) tablet 2 mg, 2 mg, Oral, Q8H PRN, [name], Ramesh, MD, 2 mg at 11/15/22 1107  Labs CBC    Component Value Date/Time   WBC 5.8 11/16/2022 0219   RBC 3.78 (L) 11/16/2022 0219   HGB 11.3 (L) 11/16/2022 0219   HCT 34.6 (L) 11/16/2022 0219   PLT 160 11/16/2022 0219   MCV 91.5 11/16/2022 0219   MCH 29.9 11/16/2022 0219   MCHC 32.7 11/16/2022 0219   RDW 14.0 11/16/2022 0219   LYMPHSABS 3.0 05/27/2022 1925   MONOABS 0.5 05/27/2022 1925   EOSABS 0.8 (H) 05/27/2022 1925   BASOSABS 0.0 05/27/2022 1925    CMP     Component Value Date/Time   NA 136 11/16/2022 0219   K 4.2 11/16/2022 0219   CL 107 11/16/2022 0219   CO2 23 11/16/2022 0219   GLUCOSE 198 (H) 11/16/2022 0219   BUN 30 (H) 11/16/2022 0219   CREATININE 1.06 (H) 11/16/2022 0219   CREATININE 0.93 06/18/2016 1528   CALCIUM 9.0 11/16/2022 0219   PROT 6.3 (L) 05/27/2022 1925   ALBUMIN 3.6 05/27/2022 1925   AST 18 05/27/2022 1925   ALT 18 05/27/2022 1925   ALKPHOS 93 05/27/2022 1925   BILITOT 0.5 05/27/2022 1925   GFRNONAA 53 (L) 11/16/2022 0219   GFRAA >60 10/16/2018 1916   Lipid Panel     Component Value Date/Time   CHOL 206 (H) 05/27/2022 2238   TRIG 171 (H) 05/27/2022 2238   HDL 60 05/27/2022 2238    CHOLHDL 3.4 05/27/2022 2238   VLDL 34 05/27/2022 2238   LDLCALC 112 (H) 05/27/2022 2238   Respiratory Panel Negative  Imaging CT-scan of the brain IMPRESSION: 1. No acute intracranial abnormality. 2. Generalized cerebral atrophy and microvascular disease changes of the supratentorial brain  CT lumbar spine  IMPRESSION: 1. No acute osseous injury within the lumbar spine. 2. 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 3. 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  Assessment:   Heidi [name] is a 82 y.o. female with a past medical history significant for hypertension, hyperlipidemia, diabetes, obstructive sleep apnea  on CPAP, hypothyroidism, tachy/bradycardia syndrome s/p pacemaker, CKD stage III, neuropathy, mild cognitive impairment, seronegative myasthenia gravis (follows with Dr. Posey Pronto, EMG diagnostic) presenting with worsening weakness concern for MG exacerbation Received IVIG - 5th day today - clinically stable to improving.  Impression: Possible myasthenia exacerbation, much improved  Recommendations: -Every 12 hour NIF and vital capacity testing -Continue home prednisone -Continue pyridostigmine 30 mg qAM and qPM as tolerated-will not increase because of diarrhea side effects -Continue IVIG 2 g/kg divided over 5 days (started 2/6), last dose should be today. Once it is complete, discharge home with follow up with Dr. Posey Pronto -Pain management per primary team  -- Amie Portland, MD Neurologist [facility] Pager: [phone]

## 2022-11-16 NOTE — Progress Notes (Signed)
Report called to [place] at [employer].

## 2022-11-16 NOTE — Discharge Summary (Signed)
Physician Discharge Summary  Heidi [name] [id number] DOB: 11/18/1940 DOA: 11/11/2022  PCP: Earmon Phoenix, NP  Admit date: 11/11/2022 Discharge date: 11/16/2022 Recommendations for Outpatient Follow-up:  Follow up with PCP in 1 weeks-call for appointment Please obtain BMP/CBC in one week  Discharge Dispo: [facility] Discharge Condition: Stable Code Status:   Code Status: DNR Diet recommendation:  Diet Order             Diet Carb Modified Fluid consistency: Thin; Room service appropriate? Yes  Diet effective now                    Brief/Interim Summary: 82 y.o. 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 She had no fevers, nausea, vomiting,has been having some diarrhea from mestinone.  No urinary symptoms. Currently on prednisone and mestinone. Seen in the ED, hemodynamically stable on room air afebrile Labs with creatinine 1.2 otherwise fairly stable CBC and BMP UA negative for UTI, respiratory virus panel negative underwent CT head and CT lumbar spine-multifactorial DJD no acute finding,, Generalized cerebral atrophy.  Neurology was consulted admitted for myasthenia crisis-started  on IVIG dose close 6/24 Patient completed 5 doses of IVIG at this time she is clinically improved no dysphagia dysarthria muscle pain has resolved.  She remains weak and deconditioned PT OT recommending skilled nursing facility and has a bed at rehab today and is being discharged in medically stable condition  Discharge Diagnoses:  Principal Problem:   Myasthenic crisis ([place]) Active Problems:   A-fib ([place])   HTN (hypertension)   Type II diabetes mellitus with renal manifestations ([place])   Hypothyroidism   CKD (chronic kidney disease), stage III ([place])  Myasthenia crisis: Appreciate neurology input.  Patient completed IVIG #5/5 .vital capacity and I was monitored  regularly, at this time seen by neurology and cleared for discharge to skilled nursing facility.  She will continue on her home  predinsone, pyridostigmine (dose increase limited due to diarrhea).Continue to monitor neuro-status closely  Mild dysphagia resolved.  Seen by speech  CKD stage IIIa creatinine stable  Hypothyroidism continue on Synthroid.  Clinically euthyroid Type 2 diabetes mellitus A1c still well at 7.4, resume metformin on discharge. Recent Labs  Lab 11/14/22 2112 11/15/22 0317 11/15/22 0819 11/15/22 1119 11/16/22 0829 11/16/22 1133  GLUCAP 142*  --  116* 195* 111* 206*  HGBA1C  --  7.4*  --   --   --   --   Hypertension BP well-controlled, at times high needing hydralazine, continue metoprolol Aldactone Paroxysmal atrial fibrillation:One episode for a couple of seconds in hospital once, none since. Cont home metoprolol Not on chronic AC  Drugs to avoid if you have Myasthenia Gravis:  Many drugs have been reported to have adverse effects in patients with MG (see below). However, not all patients react adversely to all these drugs. Conversely, not all "safe" drugs can be used with impunity in patients with MG.As a rule, the listed drugs should be avoided whenever possible, and patients with MG should be followed closely when any new drug is introduced.  Drugs that may exacerbate MG  Antibiotics  Aminoglycosides: e.g., streptomycin, tobramycin, kanamycin  Quinolones: e.g., ciprofloxacin, levofloxacin, ofloxacin, gatifloxacin  Macrolides: e.g., erythromycin, azithromycin, telithromycin  Nondepolarizing muscle relaxants for surgery  d-Tubocurarine (curare), pancuronium, vecuronium, atracurium  Beta-blocking agents  Propranolol, atenolol, metoprolol  Local anesthetics  and related agents  Procaine, Xylocaine in large amounts  Procainamide (for arrhythmias)  Botulinum toxin  Botox exacerbates weakness.  Quinine derivatives  Quinine, quinidine, chloroquine, mefloquine  ([place])  Magnesium  Decreases ACh release  Penicillamine  May cause MG  Drugs with important interactions in MG  Cyclosporine  Broad range of drug interactions, which may raise or lower cyclosporine levels  Azathioprine  Avoid allopurinol; combination may result in myelosuppression.       Consults: Neurology Subjective: Alert awake oriented resting comfortably Would like to go to rehab today  Discharge Exam: Vitals:   11/16/22 1230 11/16/22 1325  BP: (!) 186/91 (!) 147/70  Pulse:    Resp:    Temp: 98.6 F (37 C)   SpO2: 98%    General: Pt is alert, awake, not in acute distress Cardiovascular: RRR, S1/S2 +, no rubs, no gallops Respiratory: CTA bilaterally, no wheezing, no rhonchi Abdominal: Soft, NT, ND, bowel sounds + Extremities: no edema, no cyanosis  Discharge Instructions  Discharge Instructions     Discharge instructions   Complete by: As directed    Please call call MD or return to ER for similar or worsening recurring problem that brought you to hospital or if any fever,nausea/vomiting,abdominal pain, uncontrolled pain, chest pain,  shortness of breath or any other alarming symptoms.  Please follow-up your doctor as instructed in a week time and call the office for appointment.  Please avoid alcohol, smoking, or any other illicit substance and maintain healthy habits including taking your regular medications as prescribed.  You were cared for by a hospitalist during your hospital stay. If you have any questions about your discharge medications or the care you received while you were in the hospital after you are discharged, you can call the unit and ask to speak with the hospitalist on call if the hospitalist that took care of you is not available.  Once you are discharged, your primary care physician will handle any further medical issues. Please note that NO REFILLS for any discharge medications will be authorized once you are discharged, as it is  imperative that you return to your primary care physician (or establish a relationship with a primary care physician if you do not have one) for your aftercare needs so that they can reassess your need for medications and monitor your lab values   Increase activity slowly   Complete by: As directed       Allergies as of 11/16/2022       Reactions   Crestor [rosuvastatin Calcium] Other (See Comments)   Caused Myalgias   Effexor [venlafaxine Hydrochloride] Other (See Comments), Hypertension   Caused BP to go up   Aricept [donepezil]    Other reaction(s): diarrhea   Cream Base Other (See Comments)   SOUR CREAM ONLY   Latex    Other reaction(s): rash   Nardil [phenelzine]    Other reaction(s): HTN   Pork-derived Products    Hibiclens [chlorhexidine Gluconate] Hives   Lipitor [atorvastatin Calcium] Rash   Lisinopril Rash   Other reaction(s): rash   Penicillins Swelling, Other (See Comments)   Tolerated Cephalosporin Date: 05/01/22   Plavix [clopidogrel Bisulfate] Rash   Simvastatin Rash   Other reaction(s): rash   Sulfa Antibiotics Rash   Sulfonamide Derivatives Rash   Tape Rash   Other reaction(s): rash        Medication List     TAKE these medications    acetaminophen 325 MG tablet Commonly known as: TYLENOL Take  2 tablets (650 mg total) by mouth every 6 (six) hours as needed for mild pain (or Fever >/= 101).   ADULT GUMMY PO Take 1 capsule by mouth daily.   Aspirin Low Dose 81 MG chewable tablet Generic drug: aspirin Chew 81 mg by mouth daily.   cholestyramine 4 g packet Commonly known as: QUESTRAN Take 4 g by mouth daily. Make take 1 additional packet as needed for diarrhea   cyanocobalamin 1000 MCG tablet Take 1 tablet (1,000 mcg total) by mouth daily.   fenofibrate 160 MG tablet Take 160 mg by mouth daily. Take with food   ferrous sulfate 325 (65 FE) MG tablet Take 325 mg by mouth daily.   fexofenadine-pseudoephedrine 60-120 MG 12 hr  tablet Commonly known as: ALLEGRA-D Take 1 tablet by mouth 2 (two) times daily.   fluticasone 50 MCG/ACT nasal spray Commonly known as: FLONASE Place 1 spray into the nose 2 (two) times daily.   furosemide 20 MG tablet Commonly known as: LASIX TAKE 1 TABLET BY MOUTH 3 TIMES A WEEK. What changed:  how much to take how to take this when to take this additional instructions   gabapentin 300 MG capsule Commonly known as: NEURONTIN Take 300 mg by mouth 3 (three) times daily.   galantamine 8 MG 24 hr capsule Commonly known as: RAZADYNE ER Take 8 mg by mouth daily.   hydrOXYzine 25 MG tablet Commonly known as: ATARAX Take 25 mg by mouth at bedtime.   levothyroxine 75 MCG tablet Commonly known as: SYNTHROID Take 75 mcg by mouth daily.   melatonin 3 MG Tabs tablet Take 1 tablet (3 mg total) by mouth at bedtime.   metFORMIN 500 MG tablet Commonly known as: GLUCOPHAGE Take 500 mg by mouth 2 (two) times daily.   metoprolol succinate 100 MG 24 hr tablet Commonly known as: TOPROL-XL TAKE 1 TABLET(100 MG) BY MOUTH DAILY What changed: See the new instructions.   niacin 100 MG tablet Commonly known as: VITAMIN B3 Take 1 tablet (100 mg total) by mouth at bedtime.   nortriptyline 50 MG capsule Commonly known as: PAMELOR Take 2 capsules (100 mg total) by mouth at bedtime.   oxyCODONE 5 MG immediate release tablet Commonly known as: Oxy IR/ROXICODONE Take 0.5 tablets (2.5 mg total) by mouth 2 (two) times daily as needed for up to 4 doses for moderate pain.   pantoprazole 40 MG tablet Commonly known as: PROTONIX Take 40 mg by mouth 2 (two) times daily.   predniSONE 20 MG tablet Commonly known as: DELTASONE Take 1 tablet (20 mg total) by mouth daily with breakfast.   PRESCRIPTION MEDICATION Inhale into the lungs at bedtime. CPAP   pyridostigmine 60 MG tablet Commonly known as: Mestinon Take half-tablet at 10a and 4pm. What changed:  how much to take how to take  this when to take this additional instructions   sodium chloride 0.65 % Soln nasal spray Commonly known as: OCEAN Place 1 spray into both nostrils every 2 (two) hours as needed for congestion.   spironolactone 25 MG tablet Commonly known as: ALDACTONE Take 1 tablet (25 mg total) by mouth daily. What changed: when to take this   Vitamin D-3 125 MCG (5000 UT) Tabs Take 5,000 Units by mouth daily.        Contact information for follow-up providers     Earmon Phoenix, NP Follow up in 1 week(s).   Specialty: Gerontology Contact information: [address] [address] [phone]  Contact information for after-discharge care     Destination     [facility] .   Service: Skilled Nursing Contact information: [address] [place] [phone]                    Allergies  Allergen Reactions   Crestor [Rosuvastatin Calcium] Other (See Comments)    Caused Myalgias   Effexor [Venlafaxine Hydrochloride] Other (See Comments) and Hypertension    Caused BP to go up   Aricept [Donepezil]     Other reaction(s): diarrhea   Cream Base Other (See Comments)    SOUR CREAM ONLY   Latex     Other reaction(s): rash   Nardil [Phenelzine]     Other reaction(s): HTN   Pork-Derived Products    Hibiclens [Chlorhexidine Gluconate] Hives   Lipitor [Atorvastatin Calcium] Rash   Lisinopril Rash    Other reaction(s): rash   Penicillins Swelling and Other (See Comments)    Tolerated Cephalosporin Date: 05/01/22     Plavix [Clopidogrel Bisulfate] Rash   Simvastatin Rash    Other reaction(s): rash   Sulfa Antibiotics Rash   Sulfonamide Derivatives Rash   Tape Rash    Other reaction(s): rash    The results of significant diagnostics from this hospitalization (including imaging, microbiology, ancillary and laboratory) are listed below for reference.    Microbiology: Recent Results (from the past 240  hour(s))  Respiratory (~20 pathogens) panel by PCR     Status: None   Collection Time: 11/12/22 12:10 AM   Specimen: Nasopharyngeal Swab; Respiratory  Result Value Ref Range Status   Adenovirus NOT DETECTED NOT DETECTED Final   Coronavirus 229E NOT DETECTED NOT DETECTED Final    Comment: (NOTE) The Coronavirus on the Respiratory Panel, DOES NOT test for the novel  Coronavirus (2019 nCoV)    Coronavirus HKU1 NOT DETECTED NOT DETECTED Final   Coronavirus NL63 NOT DETECTED NOT DETECTED Final   Coronavirus OC43 NOT DETECTED NOT DETECTED Final   Metapneumovirus NOT DETECTED NOT DETECTED Final   Rhinovirus / Enterovirus NOT DETECTED NOT DETECTED Final   Influenza A NOT DETECTED NOT DETECTED Final   Influenza B NOT DETECTED NOT DETECTED Final   Parainfluenza Virus 1 NOT DETECTED NOT DETECTED Final   Parainfluenza Virus 2 NOT DETECTED NOT DETECTED Final   Parainfluenza Virus 3 NOT DETECTED NOT DETECTED Final   Parainfluenza Virus 4 NOT DETECTED NOT DETECTED Final   Respiratory Syncytial Virus NOT DETECTED NOT DETECTED Final   Bordetella pertussis NOT DETECTED NOT DETECTED Final   Bordetella Parapertussis NOT DETECTED NOT DETECTED Final   Chlamydophila pneumoniae NOT DETECTED NOT DETECTED Final   Mycoplasma pneumoniae NOT DETECTED NOT DETECTED Final    Comment: Performed at [facility], [place]. [address], [place], [place] [address]    Procedures/Studies: CT Lumbar Spine Wo Contrast  Result Date: 11/11/2022 CLINICAL DATA:  Initial evaluation for acute trauma.  Pain. EXAM: CT LUMBAR SPINE WITHOUT CONTRAST TECHNIQUE: Multidetector CT imaging of the lumbar spine was performed without intravenous contrast administration. Multiplanar CT image reconstructions were also generated. RADIATION DOSE REDUCTION: This exam was performed according to the departmental dose-optimization program which includes automated exposure control, adjustment of the mA and/or kV according to patient size and/or use of  iterative reconstruction technique. COMPARISON:  None available. FINDINGS: Segmentation: Standard. Lowest well-formed disc space labeled the L5-S1 level. Alignment: Moderate levoscoliosis, apex at L2-3. No significant listhesis. Vertebrae: Chronic endplate Schmorl's node deformity with  mild height loss present at the T12 vertebral body. Otherwise, vertebral body height maintained without acute or chronic fracture. Visualized sacrum and pelvis intact. No worrisome osseous lesions. Paraspinal and other soft tissues: Paraspinous soft tissues demonstrate no acute finding. Moderate aorto bi-iliac atherosclerotic disease. Hepatic steatosis noted. Colonic diverticulosis noted as well. Disc levels: L1-2:  Mild disc bulge.  No canal or foraminal stenosis. L2-3: Advanced degenerative intervertebral disc space narrowing with disc desiccation and diffuse disc bulge, asymmetric to the right. Associated reactive endplate spurring. Moderate right worse than left facet hypertrophy. Resultant mild to moderate right lateral recess stenosis. Central canal remains patent. Moderate right L2 foraminal narrowing. No significant left foraminal stenosis. L3-4: Diffuse disc bulge, asymmetric to the right. Moderate bilateral facet hypertrophy. Resultant mild spinal stenosis. Moderate right with mild left L3 foraminal narrowing. L4-5: Diffuse disc bulge. Superimposed broad left subarticular disc protrusion indents the ventral thecal sac. Moderate facet hypertrophy. Resultant moderate to severe canal with left worse than right lateral recess stenosis. Mild bilateral L4 foraminal narrowing. L5-S1: Mild disc bulge. Moderate facet hypertrophy. No canal or foraminal stenosis. IMPRESSION: 1. No acute osseous injury within the lumbar spine. 2. 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 3. 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 Aortic Atherosclerosis  (ICD10-I70.0). / Electronically Signed   By: Jeannine Boga M.D.   On: 11/11/2022 22:19   CT Head Wo Contrast  Result Date: 11/11/2022 CLINICAL DATA:  Neurological deficit.  Evaluate for acute stroke. EXAM: CT HEAD WITHOUT CONTRAST TECHNIQUE: Contiguous axial images were obtained from the base of the skull through the vertex without intravenous contrast. RADIATION DOSE REDUCTION: This exam was performed according to the departmental dose-optimization program which includes automated exposure control, adjustment of the mA and/or kV according to patient size and/or use of iterative reconstruction technique. COMPARISON:  May 29, 2022 FINDINGS: Brain: There is mild cerebral atrophy with widening of the extra-axial spaces and ventricular dilatation. There are areas of decreased attenuation within the white matter tracts of the supratentorial brain, consistent with microvascular disease changes. Vascular: There is moderate severity calcification of the bilateral cavernous carotid arteries. Skull: Normal. Negative for fracture or focal lesion. Sinuses/Orbits: No acute finding. Other: None. IMPRESSION: 1. No acute intracranial abnormality. 2. Generalized cerebral atrophy and microvascular disease changes of the supratentorial brain. Electronically Signed   By: Virgina Norfolk M.D.   On: 11/11/2022 19:23    Labs: BNP (last 3 results) No results for input(s): "BNP" in the last 8760 hours. Basic Metabolic Panel: Recent Labs  Lab 11/12/22 0159 11/13/22 0310 11/14/22 0254 11/15/22 0317 11/16/22 0219  NA 140 139 137 137 136  K 3.8 4.2 3.9 4.2 4.2  CL 100 102 104 105 107  CO2 31 28 27 23 23  $ GLUCOSE 127* 131* 190* 143* 198*  BUN 16 14 27* 28* 30*  CREATININE 1.01* 1.00 1.04* 1.10* 1.06*  CALCIUM 10.1 10.0 9.6 9.1 9.0   Liver Function Tests: No results for input(s): "AST", "ALT", "ALKPHOS", "BILITOT", "PROT", "ALBUMIN" in the last 168 hours. No results for input(s): "LIPASE", "AMYLASE" in the  last 168 hours. No results for input(s): "AMMONIA" in the last 168 hours. CBC: Recent Labs  Lab 11/12/22 0159 11/13/22 0310 11/14/22 0254 11/15/22 0317 11/16/22 0219  WBC 11.3* 8.1 6.9 5.7 5.8  HGB 14.3 13.9 12.1 11.5* 11.3*  HCT 43.5 42.4 38.5 35.1* 34.6*  MCV 90.8 89.8 92.5 91.4 91.5  PLT 234 198  175 161 160   Cardiac Enzymes: No results for input(s): "CKTOTAL", "CKMB", "CKMBINDEX", "TROPONINI" in the last 168 hours. BNP: Invalid input(s): "POCBNP" CBG: Recent Labs  Lab 11/14/22 2112 11/15/22 0819 11/15/22 1119 11/16/22 0829 11/16/22 1133  GLUCAP 142* 116* 195* 111* 206*   D-Dimer No results for input(s): "DDIMER" in the last 72 hours. Hgb A1c Recent Labs    11/15/22 0317  HGBA1C 7.4*   Lipid Profile No results for input(s): "CHOL", "HDL", "LDLCALC", "TRIG", "CHOLHDL", "LDLDIRECT" in the last 72 hours. Thyroid function studies No results for input(s): "TSH", "T4TOTAL", "T3FREE", "THYROIDAB" in the last 72 hours.  Invalid input(s): "FREET3" Anemia work up No results for input(s): "VITAMINB12", "FOLATE", "FERRITIN", "TIBC", "IRON", "RETICCTPCT" in the last 72 hours. Urinalysis    Component Value Date/Time   COLORURINE YELLOW 11/11/2022 0821   APPEARANCEUR CLEAR 11/11/2022 0821   LABSPEC 1.012 11/11/2022 0821   PHURINE 6.0 11/11/2022 0821   GLUCOSEU NEGATIVE 11/11/2022 0821   HGBUR NEGATIVE 11/11/2022 0821   BILIRUBINUR NEGATIVE 11/11/2022 0821   KETONESUR NEGATIVE 11/11/2022 0821   PROTEINUR NEGATIVE 11/11/2022 0821   UROBILINOGEN 1.0 10/17/2010 1558   NITRITE NEGATIVE 11/11/2022 0821   LEUKOCYTESUR NEGATIVE 11/11/2022 0821   Sepsis Labs Recent Labs  Lab 11/13/22 0310 11/14/22 0254 11/15/22 0317 11/16/22 0219  WBC 8.1 6.9 5.7 5.8   Microbiology Recent Results (from the past 240 hour(s))  Respiratory (~20 pathogens) panel by PCR     Status: None   Collection Time: 11/12/22 12:10 AM   Specimen: Nasopharyngeal Swab; Respiratory  Result Value  Ref Range Status   Adenovirus NOT DETECTED NOT DETECTED Final   Coronavirus 229E NOT DETECTED NOT DETECTED Final    Comment: (NOTE) The Coronavirus on the Respiratory Panel, DOES NOT test for the novel  Coronavirus (2019 nCoV)    Coronavirus HKU1 NOT DETECTED NOT DETECTED Final   Coronavirus NL63 NOT DETECTED NOT DETECTED Final   Coronavirus OC43 NOT DETECTED NOT DETECTED Final   Metapneumovirus NOT DETECTED NOT DETECTED Final   Rhinovirus / Enterovirus NOT DETECTED NOT DETECTED Final   Influenza A NOT DETECTED NOT DETECTED Final   Influenza B NOT DETECTED NOT DETECTED Final   Parainfluenza Virus 1 NOT DETECTED NOT DETECTED Final   Parainfluenza Virus 2 NOT DETECTED NOT DETECTED Final   Parainfluenza Virus 3 NOT DETECTED NOT DETECTED Final   Parainfluenza Virus 4 NOT DETECTED NOT DETECTED Final   Respiratory Syncytial Virus NOT DETECTED NOT DETECTED Final   Bordetella pertussis NOT DETECTED NOT DETECTED Final   Bordetella Parapertussis NOT DETECTED NOT DETECTED Final   Chlamydophila pneumoniae NOT DETECTED NOT DETECTED Final   Mycoplasma pneumoniae NOT DETECTED NOT DETECTED Final    Comment: Performed at [facility], [place]. [address] [place], [place] [address]     Time coordinating discharge: 25 minutes  SIGNED: Antonieta Pert, MD  [facility] 11/16/2022, 2:34 PM  If 7PM-7AM, please contact night-coverage [email or web address]

## 2022-11-17 ENCOUNTER — Encounter (HOSPITAL_COMMUNITY): Payer: Self-pay

## 2022-11-17 ENCOUNTER — Other Ambulatory Visit: Payer: Self-pay

## 2022-11-17 ENCOUNTER — Emergency Department (HOSPITAL_COMMUNITY)
Admission: EM | Admit: 2022-11-17 | Discharge: 2022-11-17 | Disposition: A | Discharge status: Rehabilitation Facility | Payer: HMO | Attending: Emergency Medicine | Admitting: Emergency Medicine

## 2022-11-17 DIAGNOSIS — Z7982 Long term (current) use of aspirin: Secondary | ICD-10-CM | POA: Diagnosis not present

## 2022-11-17 DIAGNOSIS — M5441 Lumbago with sciatica, right side: Secondary | ICD-10-CM | POA: Insufficient documentation

## 2022-11-17 DIAGNOSIS — M5431 Sciatica, right side: Secondary | ICD-10-CM

## 2022-11-17 DIAGNOSIS — Z91040 Latex allergy status: Secondary | ICD-10-CM | POA: Diagnosis not present

## 2022-11-17 DIAGNOSIS — M79604 Pain in right leg: Secondary | ICD-10-CM | POA: Diagnosis present

## 2022-11-17 LAB — CBC WITH DIFFERENTIAL/PLATELET
Abs Immature Granulocytes: 0.11 10*3/uL — ABNORMAL HIGH (ref 0.00–0.07)
Basophils Absolute: 0.1 10*3/uL (ref 0.0–0.1)
Basophils Relative: 0 %
Eosinophils Absolute: 0 10*3/uL (ref 0.0–0.5)
Eosinophils Relative: 0 %
HCT: 41.5 % (ref 36.0–46.0)
Hemoglobin: 13.9 g/dL (ref 12.0–15.0)
Immature Granulocytes: 1 %
Lymphocytes Relative: 13 %
Lymphs Abs: 1.4 10*3/uL (ref 0.7–4.0)
MCH: 30 pg (ref 26.0–34.0)
MCHC: 33.5 g/dL (ref 30.0–36.0)
MCV: 89.4 fL (ref 80.0–100.0)
Monocytes Absolute: 0.7 10*3/uL (ref 0.1–1.0)
Monocytes Relative: 6 %
Neutro Abs: 8.9 10*3/uL — ABNORMAL HIGH (ref 1.7–7.7)
Neutrophils Relative %: 80 %
Platelets: 197 10*3/uL (ref 150–400)
RBC: 4.64 MIL/uL (ref 3.87–5.11)
RDW: 13.8 % (ref 11.5–15.5)
WBC: 11.2 10*3/uL — ABNORMAL HIGH (ref 4.0–10.5)
nRBC: 0 % (ref 0.0–0.2)

## 2022-11-17 LAB — BASIC METABOLIC PANEL
Anion gap: 12 (ref 5–15)
BUN: 16 mg/dL (ref 8–23)
CO2: 21 mmol/L — ABNORMAL LOW (ref 22–32)
Calcium: 9.3 mg/dL (ref 8.9–10.3)
Chloride: 100 mmol/L (ref 98–111)
Creatinine, Ser: 0.76 mg/dL (ref 0.44–1.00)
GFR, Estimated: >60 mL/min (ref >60)
Glucose, Bld: 163 mg/dL — ABNORMAL HIGH (ref 70–99)
Potassium: 3.5 mmol/L (ref 3.5–5.1)
Sodium: 133 mmol/L — ABNORMAL LOW (ref 135–145)

## 2022-11-17 MED ORDER — KETOROLAC TROMETHAMINE 30 MG/ML IJ SOLN
15.0000 mg | Freq: Once | INTRAMUSCULAR | Status: AC
  Administered 2022-11-17: 15 mg via INTRAVENOUS
  Filled 2022-11-17: qty 1

## 2022-11-17 MED ORDER — LIDOCAINE 5 % EX PTCH: 1.0000 | MEDICATED_PATCH | CUTANEOUS | 0 refills | Status: DC

## 2022-11-17 MED ORDER — PREDNISONE 20 MG PO TABS
60.0000 mg | ORAL_TABLET | Freq: Once | ORAL | Status: AC
  Administered 2022-11-17: 60 mg via ORAL
  Filled 2022-11-17: qty 3

## 2022-11-17 MED ORDER — ONDANSETRON HCL 4 MG/2ML IJ SOLN
4.0000 mg | Freq: Once | INTRAMUSCULAR | Status: AC
  Administered 2022-11-17: 4 mg via INTRAVENOUS
  Filled 2022-11-17: qty 2

## 2022-11-17 MED ORDER — METHOCARBAMOL 500 MG PO TABS: 500.0000 mg | ORAL_TABLET | Freq: Two times a day (BID) | ORAL | 0 refills | Status: AC

## 2022-11-17 MED ORDER — HYDRALAZINE HCL 20 MG/ML IJ SOLN
10.0000 mg | Freq: Once | INTRAMUSCULAR | Status: AC
  Administered 2022-11-17: 10 mg via INTRAVENOUS
  Filled 2022-11-17: qty 1

## 2022-11-17 MED ORDER — LIDOCAINE 5 % EX PTCH
1.0000 | MEDICATED_PATCH | CUTANEOUS | Status: DC
  Administered 2022-11-17: 1 via TRANSDERMAL
  Filled 2022-11-17: qty 1

## 2022-11-17 MED ORDER — HYDROMORPHONE HCL 1 MG/ML IJ SOLN
0.5000 mg | Freq: Once | INTRAMUSCULAR | Status: AC
  Administered 2022-11-17: 0.5 mg via INTRAVENOUS
  Filled 2022-11-17: qty 1

## 2022-11-17 MED ORDER — MELOXICAM 7.5 MG PO TABS: 7.5000 mg | ORAL_TABLET | Freq: Every day | ORAL | 0 refills | Status: AC

## 2022-11-17 NOTE — ED Notes (Signed)
Called [facility] to transport patient to [place]

## 2022-11-17 NOTE — ED Provider Notes (Signed)
[facility] Provider Note   CSN: [id number] Arrival date & time: 11/17/22  0007     History  Chief Complaint  Patient presents with   Leg Pain    Heidi [name] is a 82 y.o. female.  Patient presents to the emergency department for evaluation of right lower back and leg pain.  Patient has been experiencing right-sided sciatica pain for several weeks after a fall.  She was just admitted this week for a myasthenia gravis crisis.  During that hospital stay she was having this pain and reports that she never achieved adequate pain control during the hospital stay.  Her myasthenia gravis crisis did resolved.  She reports that when she was in the hospital the Toradol seemed to help the most.  She was discharged with Vicodin which has not helped the pain.       Home Medications Prior to Admission medications   Medication Sig Start Date End Date Taking? Authorizing Provider  lidocaine (LIDODERM) 5 % Place 1 patch onto the skin daily. Remove & Discard patch within 12 hours or as directed by MD 11/17/22  Yes Melane Windholz, Gwenyth Allegra, MD  meloxicam (MOBIC) 7.5 MG tablet Take 1 tablet (7.5 mg total) by mouth daily. 11/17/22  Yes Charmion Hapke, Gwenyth Allegra, MD  methocarbamol (ROBAXIN) 500 MG tablet Take 1 tablet (500 mg total) by mouth 2 (two) times daily. 11/17/22  Yes Tamaira Ciriello, Gwenyth Allegra, MD  acetaminophen (TYLENOL) 325 MG tablet Take 2 tablets (650 mg total) by mouth every 6 (six) hours as needed for mild pain (or Fever >/= 101). 05/30/22   Elgergawy, Silver Huguenin, MD  ASPIRIN LOW DOSE 81 MG chewable tablet Chew 81 mg by mouth daily. 11/07/22   [provider]  Cholecalciferol (VITAMIN D-3) 125 MCG (5000 UT) TABS Take 5,000 Units by mouth daily.    [provider]  cholestyramine (QUESTRAN) 4 g packet Take 4 g by mouth daily. Make take 1 additional packet as needed for diarrhea 08/13/18   [provider]  cyanocobalamin 1000 MCG tablet  Take 1 tablet (1,000 mcg total) by mouth daily. 05/31/22   Elgergawy, Silver Huguenin, MD  fenofibrate 160 MG tablet Take 160 mg by mouth daily. Take with food 12/30/18   [provider]  ferrous sulfate 325 (65 FE) MG tablet Take 325 mg by mouth daily.    [provider]  fexofenadine-pseudoephedrine (ALLEGRA-D) 60-120 MG 12 hr tablet Take 1 tablet by mouth 2 (two) times daily.    [provider]  fluticasone (FLONASE) 50 MCG/ACT nasal spray Place 1 spray into the nose 2 (two) times daily.    [provider]  furosemide (LASIX) 20 MG tablet TAKE 1 TABLET BY MOUTH 3 TIMES A WEEK. Patient taking differently: Take 20 mg by mouth every Monday, Wednesday, and Friday. 05/07/21   Jerline Pain, MD  gabapentin (NEURONTIN) 300 MG capsule Take 300 mg by mouth 3 (three) times daily.    [provider]  galantamine (RAZADYNE ER) 8 MG 24 hr capsule Take 8 mg by mouth daily. 08/13/18   [provider]  hydrOXYzine (ATARAX/VISTARIL) 25 MG tablet Take 25 mg by mouth at bedtime.    [provider]  levothyroxine (SYNTHROID, LEVOTHROID) 75 MCG tablet Take 75 mcg by mouth daily.    [provider]  melatonin 3 MG TABS tablet Take 1 tablet (3 mg total) by mouth at bedtime. 05/30/22   Elgergawy, Silver Huguenin, MD  metFORMIN (  GLUCOPHAGE) 500 MG tablet Take 500 mg by mouth 2 (two) times daily.    [provider]  metoprolol succinate (TOPROL-XL) 100 MG 24 hr tablet TAKE 1 TABLET(100 MG) BY MOUTH DAILY Patient taking differently: Take 100 mg by mouth daily. 04/25/21   Jerline Pain, MD  Multiple Vitamins-Minerals (ADULT GUMMY PO) Take 1 capsule by mouth daily.    [provider]  niacin (VITAMIN B3) 100 MG tablet Take 1 tablet (100 mg total) by mouth at bedtime. 05/30/22   Elgergawy, Silver Huguenin, MD  nortriptyline (PAMELOR) 50 MG capsule Take 2 capsules (100 mg total) by mouth at bedtime. 05/30/22   Elgergawy, Silver Huguenin, MD  oxyCODONE (OXY IR/ROXICODONE)  5 MG immediate release tablet Take 0.5 tablets (2.5 mg total) by mouth 2 (two) times daily as needed for up to 4 doses for moderate pain. 11/16/22   Antonieta Pert, MD  pantoprazole (PROTONIX) 40 MG tablet Take 40 mg by mouth 2 (two) times daily. 06/01/18   [provider]  predniSONE (DELTASONE) 20 MG tablet Take 1 tablet (20 mg total) by mouth daily with breakfast. 10/30/22   Narda Amber K, DO  PRESCRIPTION MEDICATION Inhale into the lungs at bedtime. CPAP Patient not taking: Reported on 11/12/2022    [provider]  pyridostigmine (MESTINON) 60 MG tablet Take half-tablet at 10a and 4pm. Patient taking differently: Take 30 mg by mouth See admin instructions. Take half-tablet by mouth at 10a and 4pm. 10/30/22   Narda Amber K, DO  sodium chloride (OCEAN) 0.65 % SOLN nasal spray Place 1 spray into both nostrils every 2 (two) hours as needed for congestion.    [provider]  spironolactone (ALDACTONE) 25 MG tablet Take 1 tablet (25 mg total) by mouth daily. Patient taking differently: Take 25 mg by mouth at bedtime. 10/03/17   Deboraha Sprang, MD      Allergies    Crestor [rosuvastatin calcium], Effexor [venlafaxine hydrochloride], Aricept [donepezil], Cream base, Latex, Nardil [phenelzine], Pork-derived products, Hibiclens [chlorhexidine gluconate], Lipitor [atorvastatin calcium], Lisinopril, Penicillins, Plavix [clopidogrel bisulfate], Simvastatin, Sulfa antibiotics, Sulfonamide derivatives, and Tape    Review of Systems   Review of Systems  Physical Exam Updated Vital Signs BP (!) 173/82   Pulse 89   Temp 98.1 F (36.7 C) (Oral)   Resp 17   Ht 5' 1"$  (1.549 m)   Wt 69.9 kg   SpO2 96%   BMI 29.10 kg/m  Physical Exam Vitals and nursing note reviewed.  Constitutional:      General: She is not in acute distress.    Appearance: She is well-developed.  HENT:     Head: Normocephalic and atraumatic.     Mouth/Throat:     Mouth: Mucous membranes are moist.  Eyes:      General: Vision grossly intact. Gaze aligned appropriately.     Extraocular Movements: Extraocular movements intact.     Conjunctiva/sclera: Conjunctivae normal.  Cardiovascular:     Rate and Rhythm: Normal rate and regular rhythm.     Pulses: Normal pulses.     Heart sounds: Normal heart sounds, S1 normal and S2 normal. No murmur heard.    No friction rub. No gallop.  Pulmonary:     Effort: Pulmonary effort is normal. No respiratory distress.     Breath sounds: Normal breath sounds.  Abdominal:     General: Bowel sounds are normal.     Palpations: Abdomen is soft.     Tenderness: There is no abdominal tenderness.  There is no guarding or rebound.     Hernia: No hernia is present.  Musculoskeletal:        General: No swelling.     Cervical back: Full passive range of motion without pain, normal range of motion and neck supple. No spinous process tenderness or muscular tenderness. Normal range of motion.     Lumbar back: Positive left straight leg raise test.     Right lower leg: No edema.     Left lower leg: No edema.  Skin:    General: Skin is warm and dry.     Capillary Refill: Capillary refill takes less than 2 seconds.     Findings: No ecchymosis, erythema, rash or wound.  Neurological:     General: No focal deficit present.     Mental Status: She is alert and oriented to person, place, and time.     GCS: GCS eye subscore is 4. GCS verbal subscore is 5. GCS motor subscore is 6.     Cranial Nerves: Cranial nerves 2-12 are intact.     Sensory: Sensation is intact.     Motor: Motor function is intact.     Coordination: Coordination is intact.  Psychiatric:        Attention and Perception: Attention normal.        Mood and Affect: Mood normal.        Speech: Speech normal.        Behavior: Behavior normal.     ED Results / Procedures / Treatments   Labs (all labs ordered are listed, but only abnormal results are displayed) Labs Reviewed  CBC WITH  DIFFERENTIAL/PLATELET - Abnormal; Notable for the following components:      Result Value   WBC 11.2 (*)    Neutro Abs 8.9 (*)    Abs Immature Granulocytes 0.11 (*)    All other components within normal limits  BASIC METABOLIC PANEL - Abnormal; Notable for the following components:   Sodium 133 (*)    CO2 21 (*)    Glucose, Bld 163 (*)    All other components within normal limits    EKG None  Radiology No results found.  Procedures Procedures    Medications Ordered in ED Medications  lidocaine (LIDODERM) 5 % 1 patch (1 patch Transdermal Patch Applied 11/17/22 0137)  ketorolac (TORADOL) 30 MG/ML injection 15 mg (15 mg Intravenous Given 11/17/22 0136)  predniSONE (DELTASONE) tablet 60 mg (60 mg Oral Given 11/17/22 0137)  HYDROmorphone (DILAUDID) injection 0.5 mg (0.5 mg Intravenous Given 11/17/22 0130)  ondansetron (ZOFRAN) injection 4 mg (4 mg Intravenous Given 11/17/22 0131)  hydrALAZINE (APRESOLINE) injection 10 mg (10 mg Intravenous Given 11/17/22 0249)    ED Course/ Medical Decision Making/ A&P                             Medical Decision Making Amount and/or Complexity of Data Reviewed External Data Reviewed: labs and notes. Labs: ordered. Decision-making details documented in ED Course. Radiology: ordered and independent interpretation performed. Decision-making details documented in ED Course. ECG/medicine tests: ordered and independent interpretation performed. Decision-making details documented in ED Course.  Risk Prescription drug management.   Patient presents to the emerged department for evaluation of right-sided back and leg pain.  Patient has been suffering from sciatica since she fell several weeks ago.  Patient reports that she was just admitted to the hospital.  Records were reviewed and she was admitted for  5 days for myasthenia gravis crisis.  Patient does not have any weakness currently that would suggest recurrent crisis.  Examination reveals some  painful inhibition of raising the right leg and positive straight leg raise without saddle anesthesia or foot drop.  No red flags.  Unfortunately, patient has a non-MRI compatible pacemaker.  Additionally, with her myasthenia gravis, she is not a candidate for elective back surgery so supportive care is planned.  Patient given Dilaudid, Toradol, lidocaine patch.  She was given a dose of prednisone 60.  She is chronically on 20 mg of prednisone for her myasthenia gravis.  Increasing her dose and tapering back down to 20 can be effective in sciatica.  Will discharge with additional support and low-dose Mobic, low-dose Robaxin, lidocaine.  She already has Percocet prescribed.  Her blood pressure very elevated here.  She does take Toprol-XL daily.  She might need increased blood pressure control but would prefer to get pain better controlled and have this changed as an outpatient by her doctor.        Final Clinical Impression(s) / ED Diagnoses Final diagnoses:  Sciatica of right side    Rx / DC Orders ED Discharge Orders          Ordered    meloxicam (MOBIC) 7.5 MG tablet  Daily        11/17/22 0545    lidocaine (LIDODERM) 5 %  Every 24 hours        11/17/22 0545    methocarbamol (ROBAXIN) 500 MG tablet  2 times daily        11/17/22 0545              Orpah Greek, MD 11/17/22 [phone]

## 2022-11-17 NOTE — ED Triage Notes (Signed)
Patient arrives with [facility] from [facility], originally from [facility] c/o right leg pain. Patient was discharged earlier this afternoon for myasthenia gravis flare up with same sxs. Pt reports pain did not improve and was unable to tolerate anything by mouth. Pt currently vomiting in triage.

## 2022-11-26 LAB — GLUCOSE, CAPILLARY
Glucose-Capillary: 208 mg/dL — ABNORMAL HIGH (ref 70–99)
Glucose-Capillary: 145 mg/dL — ABNORMAL HIGH (ref 70–99)

## 2022-12-03 ENCOUNTER — Encounter: Payer: Medicare Other | Admitting: Internal Medicine

## 2022-12-04 ENCOUNTER — Ambulatory Visit: Payer: HMO

## 2022-12-04 DIAGNOSIS — I495 Sick sinus syndrome: Secondary | ICD-10-CM

## 2022-12-04 LAB — CUP PACEART REMOTE DEVICE CHECK
Battery Impedance: 1967 Ohm
Battery Remaining Longevity: 40 mo
Battery Voltage: 2.76 V
Brady Statistic AP VP Percent: 0 %
Brady Statistic AP VS Percent: 13 %
Brady Statistic AS VP Percent: 0 %
Brady Statistic AS VS Percent: 86 %
Date Time Interrogation Session: 20240228071854
Implantable Lead Connection Status: 753985
Implantable Lead Connection Status: 753985
Implantable Lead Implant Date: 20070710
Implantable Lead Implant Date: 20070710
Implantable Lead Location: 753859
Implantable Lead Location: 753860
Implantable Lead Model: 4469
Implantable Lead Model: 4470
Implantable Lead Serial Number: 483166
Implantable Lead Serial Number: 541525
Implantable Pulse Generator Implant Date: 20130410
Lead Channel Impedance Value: 613 Ohm
Lead Channel Impedance Value: 630 Ohm
Lead Channel Pacing Threshold Amplitude: 0.5 V
Lead Channel Pacing Threshold Amplitude: 0.625 V
Lead Channel Pacing Threshold Pulse Width: 0.4 ms
Lead Channel Pacing Threshold Pulse Width: 0.4 ms
Lead Channel Setting Pacing Amplitude: 2 V
Lead Channel Setting Pacing Amplitude: 2.5 V
Lead Channel Setting Pacing Pulse Width: 0.4 ms
Lead Channel Setting Sensing Sensitivity: 5.6 mV
Zone Setting Status: 755011
Zone Setting Status: 755011

## 2022-12-09 ENCOUNTER — Ambulatory Visit: Payer: HMO | Admitting: Internal Medicine

## 2022-12-09 DIAGNOSIS — Z950 Presence of cardiac pacemaker: Secondary | ICD-10-CM

## 2022-12-09 DIAGNOSIS — I495 Sick sinus syndrome: Secondary | ICD-10-CM

## 2022-12-09 DIAGNOSIS — I4891 Unspecified atrial fibrillation: Secondary | ICD-10-CM

## 2022-12-09 DIAGNOSIS — I10 Essential (primary) hypertension: Secondary | ICD-10-CM

## 2022-12-30 ENCOUNTER — Encounter: Payer: Self-pay | Admitting: Neurology

## 2022-12-30 ENCOUNTER — Ambulatory Visit (INDEPENDENT_AMBULATORY_CARE_PROVIDER_SITE_OTHER): Payer: HMO | Admitting: Neurology

## 2022-12-30 VITALS — BP 142/78 | HR 77 | Ht 61.0 in | Wt 152.0 lb

## 2022-12-30 DIAGNOSIS — G7001 Myasthenia gravis with (acute) exacerbation: Secondary | ICD-10-CM | POA: Diagnosis not present

## 2022-12-30 NOTE — Progress Notes (Signed)
Follow-up Visit   Date: 12/30/2022    Heidi [name] MRN: [id number] DOB: 06-23-1941    Heidi [name] is a 82 y.o. 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  The patient was accompanied to the clinic by daughter who also provides collateral information.     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  Symptoms initially presented with dysarthria, diplopia, ptosis, and dysphagia. AChR and MUSK antibody negative, thymoma negative.  She reports noticing benefit with mestinon but has been unable to tolerate it due to GI side effects.  She was hospitalized in February for MG exacerbation and received IVIG.   Due to ongoing symptoms of dysphagia, dysarthria, and generalized weakness, I will plan to start maintenance IVIG 1g/kg every 28 days.  Continue prednisone to 20mg  daily Continue mestinon to 30mg  twice daily at 10a and 4p due to GI side effects  Return to clinic in 2-3 months  --------------------------------------------- UPDATE 07/17/2022:  She was hospitalized in August for falls and hallucinations, found to be associated with her dementia.  She was discharged to rehab and now back at [facility].  She was falling frequently prior to her hospitalization, but reports no falls over the past 2 months.  Since being home, she has noticed difficulty with speaking and words being slurred, especially as the day progresses.  She continues to have intermittent diplopia and wears prisms.  Eyelids still can be droopy, but slightly better than before.  Sometimes, her food gets stuck in her throat.  No arms or leg weakness.   She ha spells of visual distortion, described as if a jug-saw puzzle pieces are moving.  This occurs most days of the week.  She also reports having headaches at the left  base of her head.   UPDATE 07/31/2022:  She started mestinon 60mg  1 tablet at 9am and 2pm.  She has noticed that about 4-5 hours after she takes the tablet, her speech starts to get slurred and double vision returns.  She has some difficulty swallowing her morning medications.  No problems with eating.  She also reports having spells of leg weakness and had to use a wheelchair at one point after playing bingo.   UPDATE 08/28/2022:    In October, she had EMG was consistent with myasthenia gravis and was started on prednisone 10mg  daily.  She noticed marked improvement in leg strength, swallowing, speech, and double vision.  She continues to have mild intermittent double vision and difficulty with getting words out.  No slurred speech.  She has been taking mestinon 60mg  three times daily, but reports having diarrhea with it.  It does help, because by around 5-6pm, she is feeling generalized weakness again.   UPDATE 10/30/2022:  She is here for follow-up visit.  She reports no significant change in her double vision or difficulty talking since her last visit, despite increasing prednisone to 15mg  daily.  She also feels that her legs are weak and has started PT for balance.    Due to GI upset, she reduced mestinon to 30mg  at 9am, 60mg  at 2pm, and 30mg  at 5pm, which has helped diarrhea, but she continues to have cramps.  Overall, she is not feeling well.   UPDATE 12/30/2022:  She is here for follow-up visit.  She was hospitalized in February because of increased weakness and falls and received IVIG x 5 days.  She feels it may have stabilized her symptoms, but still feels weak and takes her medications with applesauce.  She continues to have mild slurred speech.  She walks with a walker even for short distances.  She will be getting home PT/OT.    Medications:  Current Outpatient Medications on File Prior to Visit  Medication Sig Dispense Refill   acetaminophen (TYLENOL) 325 MG tablet Take 2 tablets (650 mg  total) by mouth every 6 (six) hours as needed for mild pain (or Fever >/= 101).     ASPIRIN LOW DOSE 81 MG chewable tablet Chew 81 mg by mouth daily.     Cholecalciferol (VITAMIN D-3) 125 MCG (5000 UT) TABS Take 5,000 Units by mouth daily.     cholestyramine (QUESTRAN) 4 g packet Take 4 g by mouth daily. Make take 1 additional packet as needed for diarrhea  6   cyanocobalamin 1000 MCG tablet Take 1 tablet (1,000 mcg total) by mouth daily.     fenofibrate 160 MG tablet Take 160 mg by mouth daily. Take with food     ferrous sulfate 325 (65 FE) MG tablet Take 325 mg by mouth daily.     fexofenadine-pseudoephedrine (ALLEGRA-D) 60-120 MG 12 hr tablet Take 1 tablet by mouth 2 (two) times daily.     fluticasone (FLONASE) 50 MCG/ACT nasal spray Place 1 spray into the nose 2 (two) times daily.     furosemide (LASIX) 20 MG tablet TAKE 1 TABLET BY MOUTH 3 TIMES A WEEK. (Patient taking differently: Take 20 mg by mouth every Monday, Wednesday, and Friday.) 45 tablet 3   gabapentin (NEURONTIN) 300 MG capsule Take 300 mg by mouth 3 (three) times daily.     galantamine (RAZADYNE ER) 8 MG 24 hr capsule Take 8 mg by mouth daily.  2   HYDROcodone-acetaminophen (NORCO/VICODIN) 5-325 MG tablet Take 1 tablet by mouth every 6 (six) hours as needed.     hydrOXYzine (ATARAX/VISTARIL) 25 MG tablet Take 25 mg by mouth at bedtime.     levothyroxine (SYNTHROID, LEVOTHROID) 75 MCG tablet Take 75 mcg by mouth daily.     lidocaine (LIDODERM) 5 % Place 1 patch onto the skin daily. Remove & Discard patch within 12 hours or as directed by MD 30 patch 0   melatonin 3 MG TABS tablet Take 1 tablet (3 mg total) by mouth at bedtime.  0   meloxicam (MOBIC) 7.5 MG tablet Take 1 tablet (7.5 mg total) by mouth daily. 15 tablet 0   metFORMIN (GLUCOPHAGE) 500 MG tablet Take 500 mg by mouth 2 (two) times daily.     methocarbamol (ROBAXIN) 500 MG tablet Take 1 tablet (500 mg total) by mouth 2 (two) times daily. 20 tablet 0   metoprolol  succinate (TOPROL-XL) 100 MG 24 hr tablet TAKE 1 TABLET(100 MG) BY MOUTH DAILY (Patient taking differently: Take 100 mg by mouth daily.) 90 tablet 3   Multiple Vitamins-Minerals (ADULT GUMMY PO) Take 1 capsule by mouth daily.     niacin (VITAMIN B3) 100 MG tablet Take 1 tablet (100 mg total) by mouth at bedtime.     nortriptyline (PAMELOR) 50 MG capsule Take 2 capsules (100 mg total) by mouth at bedtime.     pantoprazole (PROTONIX) 40 MG tablet Take 40 mg by mouth 2 (two) times daily.     predniSONE (DELTASONE) 20 MG tablet Take 1 tablet (20 mg total) by mouth daily with breakfast. 30 tablet 3   PRESCRIPTION MEDICATION Inhale into the lungs at  bedtime. CPAP     pyridostigmine (MESTINON) 60 MG tablet Take half-tablet at 10a and 4pm. (Patient taking differently: Take 30 mg by mouth See admin instructions. Take half-tablet by mouth at 10a and 4pm.) 90 tablet 5   sodium chloride (OCEAN) 0.65 % SOLN nasal spray Place 1 spray into both nostrils every 2 (two) hours as needed for congestion.     spironolactone (ALDACTONE) 25 MG tablet Take 1 tablet (25 mg total) by mouth daily. (Patient taking differently: Take 25 mg by mouth at bedtime.) 30 tablet 11   No current facility-administered medications on file prior to visit.    Allergies:  Allergies  Allergen Reactions   Crestor [Rosuvastatin Calcium] Other (See Comments)    Caused Myalgias   Effexor [Venlafaxine Hydrochloride] Other (See Comments) and Hypertension    Caused BP to go up   Aricept [Donepezil]     Other reaction(s): diarrhea   Cream Base Other (See Comments)    SOUR CREAM ONLY   Latex     Other reaction(s): rash   Nardil [Phenelzine]     Other reaction(s): HTN   Pork-Derived Products    Hibiclens [Chlorhexidine Gluconate] Hives   Lipitor [Atorvastatin Calcium] Rash   Lisinopril Rash    Other reaction(s): rash   Penicillins Swelling and Other (See Comments)    Tolerated Cephalosporin Date: 05/01/22     Plavix [Clopidogrel  Bisulfate] Rash   Simvastatin Rash    Other reaction(s): rash   Sulfa Antibiotics Rash   Sulfonamide Derivatives Rash   Tape Rash    Other reaction(s): rash    Vital Signs:  BP (!) 142/78   Pulse 77   Ht 5\' 1"  (1.549 m)   Wt 152 lb (68.9 kg)   SpO2 97%   BMI 28.72 kg/m     Neurological Exam: MENTAL STATUS including orientation to time, place, person, recent and remote memory, attention span and concentration, language, and fund of knowledge is normal.  Speech is mildly dysarthric.  CRANIAL NERVES:  No visual field defects.  Pupils equal round and reactive to light.  Normal conjugate, extra-ocular eye movements in all directions of gaze. No ptosis at rest or with sustained upgaze .  Face is symmetric. Palate elevates symmetrically.  Tongue is midline.  MOTOR:  Motor strength is 5/5 in all extremities, no fatigability.  No atrophy, fasciculations or abnormal movements.  No pronator drift.  Tone is normal.    COORDINATION/GAIT:  Normal finger-to- nose-finger.  Gait not tested today   Data: CT head 05/29/2022:  negative  Lab Results  Component Value Date   TSH 5.134 (H) 05/27/2022    Lab Results  Component Value Date   HGBA1C 7.4 (H) 11/15/2022   CT lumbar spine wo contrast 11/11/2022: 1. No acute osseous injury within the lumbar spine. 2. 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 3. 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  Total time spent reviewing records, interview, history/exam, documentation, and coordination of care on day of encounter:  30 min    Thank you for allowing me to participate in patient's care.  If I can answer any additional questions, I would be pleased to do so.    Sincerely,    Anthonie Lotito K. Posey Pronto, DO

## 2022-12-30 NOTE — Patient Instructions (Signed)
We will start IVIG as home infusion once per month  Continue prednisone 20mg  daily and mestinon 30mg  twice daily  I will see you back in 2-3 months

## 2022-12-31 ENCOUNTER — Telehealth: Payer: Self-pay

## 2022-12-31 NOTE — Telephone Encounter (Signed)
I have sent a email to [place] to inform her

## 2022-12-31 NOTE — Telephone Encounter (Signed)
Let's plan to infuse over 1 day.

## 2022-12-31 NOTE — Telephone Encounter (Signed)
Anderson Malta is calling in from [facility], needing clarification on a referral that was submitted if the IVIG needs to be over one day or two. Can respond by calling or back through the email.

## 2023-01-07 ENCOUNTER — Ambulatory Visit: Payer: HMO | Attending: Internal Medicine | Admitting: Internal Medicine

## 2023-01-07 ENCOUNTER — Encounter: Payer: Self-pay | Admitting: Internal Medicine

## 2023-01-07 VITALS — BP 124/72 | HR 99 | Ht 61.0 in | Wt 154.8 lb

## 2023-01-07 DIAGNOSIS — I5032 Chronic diastolic (congestive) heart failure: Secondary | ICD-10-CM | POA: Diagnosis not present

## 2023-01-07 DIAGNOSIS — I480 Paroxysmal atrial fibrillation: Secondary | ICD-10-CM

## 2023-01-07 DIAGNOSIS — Z950 Presence of cardiac pacemaker: Secondary | ICD-10-CM | POA: Diagnosis not present

## 2023-01-07 DIAGNOSIS — I495 Sick sinus syndrome: Secondary | ICD-10-CM

## 2023-01-07 NOTE — Patient Instructions (Signed)
Medication Instructions:  Your physician recommends that you continue on your current medications as directed. Please refer to the Current Medication list given to you today.  *If you need a refill on your cardiac medications before your next appointment, please call your pharmacy*   Lab Work: None ordered.  If you have labs (blood work) drawn today and your tests are completely normal, you will receive your results only by: MyChart Message (if you have MyChart) OR A paper copy in the mail If you have any lab test that is abnormal or we need to change your treatment, we will call you to review the results.   Testing/Procedures: None ordered.    Follow-Up: At [facility], you and your health needs are our priority.  As part of our continuing mission to provide you with exceptional heart care, we have created designated Provider Care Teams.  These Care Teams include your primary Cardiologist (physician) and Advanced Practice Providers (APPs -  Physician Assistants and Nurse Practitioners) who all work together to provide you with the care you need, when you need it.  We recommend signing up for the patient portal called "MyChart".  Sign up information is provided on this After Visit Summary.  MyChart is used to connect with patients for Virtual Visits (Telemedicine).  Patients are able to view lab/test results, encounter notes, upcoming appointments, etc.  Non-urgent messages can be sent to your provider as well.   To learn more about what you can do with MyChart, go to [email or web address].    Your next appointment:   12 months with Dr Klein 

## 2023-01-07 NOTE — Progress Notes (Signed)
Remote pacemaker transmission.   

## 2023-01-07 NOTE — Progress Notes (Signed)
Patient Care Team: Earmon Phoenix, NP as PCP - General (Gerontology) Jerline Pain, MD as PCP - Cardiology (Cardiology) Alda Berthold, DO as Consulting Physician (Neurology)   HPI  Heidi [name] is a 82 y.o. 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   Cardiac catheterization was performed on 02/12/2006 which showed minor irregularities, mid RCA 40%, left main normal, LAD normal, ejection fraction 65%.   DATE TEST EF   9/17 Echo   55-65 %   12/18 Myoview     % No perfusion defects  3/23 Echo  60-65%   8/23 Echo  60-64%    Hospitalized 2/24 for what was deemed a myasthenic crisis.    The patient denies chest pain , shortness of breath, nocturnal dyspnea, orthopnea or peripheral edema.  There have been no palpitations, lightheadedness or syncope.  Complains of significant back pain following a fall--2/2 myasthenic crisis.    Date Cr K Hgb  9/17 0.93 3.7   8/18 1.3 3.7   2/24 0.76 3.5 13.9          Past Medical History:  Diagnosis Date   Anxiety    Atrial fibrillation    Intermittent   CAD (coronary artery disease)    non obstructive   CKD (chronic kidney disease)    Diabetes mellitus without complication    Dysrhythmia    PAF   GERD (gastroesophageal reflux disease)    Glaucoma    Narrow Angle   Heart murmur    HTN (hypertension)    Hyperlipidemia    Hypothyroid    Major depression    10-17-2010: hospitalized for suicidal, [name] (D/C 10-30-2010)   Migraine headache    OSA (obstructive sleep apnea)    CPAP nightly   Peripheral neuropathy    Presence of permanent cardiac pacemaker    Seasonal allergies    Sleep apnea    Tachy-brady syndrome 04/06/2006   PPM placed    Past Surgical History:  Procedure Laterality Date   ABDOMINAL ADHESION SURGERY  2008   exploratory to remove attached to the  abdominal wall, stomach and intestines.    ABDOMINAL HYSTERECTOMY  1976   APPENDECTOMY     BREAST EXCISIONAL BIOPSY     BREAST SURGERY     fibroids removed, benign   CARDIAC CATHETERIZATION  02-12-2006   Minor irregularities: RCA-mid 40%, LM-normal, LAD-normal. EF 65%.   CHOLECYSTECTOMY     ECTOPIC PREGNANCY SURGERY     treatment x3, last one 11-02-2009 at [facility]   goiter     goiter removed     MASS EXCISION Right 10/18/2015   Procedure: EXCISION OF 6CM MASS ON RIGHT  NECK/BACK;  Surgeon: [name], MD;  Location: [place];  Service: General;  Laterality: Right;   PACEMAKER GENERATOR CHANGE N/A 01/15/2012   Procedure: PACEMAKER GENERATOR CHANGE;  Surgeon: Deboraha Sprang, MD;  Location: [facility] CATH LAB;  Service: Cardiovascular;  Laterality: N/A;   PACEMAKER INSERTION     Permanent. Medtronic EnRhyth 04/2006, set as DDDR   REVERSE SHOULDER ARTHROPLASTY Right 05/01/2022   Procedure: REVERSE SHOULDER ARTHROPLASTY;  Surgeon: Hiram Gash, MD;  Location: [facility];  Service: Orthopedics;  Laterality: Right;   TUMOR REMOVAL  2000   Fibroid from breast x4-most recent   WRIST FRACTURE SURGERY  11-2007   right, had metal  plate inserted    Current Outpatient Medications  Medication Sig Dispense Refill   acetaminophen (TYLENOL) 325 MG tablet Take 2 tablets (650 mg total) by mouth every 6 (six) hours as needed for mild pain (or Fever >/= 101).     ASPIRIN LOW DOSE 81 MG chewable tablet Chew 81 mg by mouth daily.     Cholecalciferol (VITAMIN D-3) 125 MCG (5000 UT) TABS Take 5,000 Units by mouth daily.     cholestyramine (QUESTRAN) 4 g packet Take 4 g by mouth daily. Make take 1 additional packet as needed for diarrhea  6   cyanocobalamin 1000 MCG tablet Take 1 tablet (1,000 mcg total) by mouth daily.     fenofibrate 160 MG tablet Take 160 mg by mouth daily. Take with food     ferrous sulfate 325 (65 FE) MG tablet Take 325 mg by mouth daily.     fexofenadine-pseudoephedrine  (ALLEGRA-D) 60-120 MG 12 hr tablet Take 1 tablet by mouth 2 (two) times daily.     fluticasone (FLONASE) 50 MCG/ACT nasal spray Place 1 spray into the nose 2 (two) times daily.     furosemide (LASIX) 20 MG tablet TAKE 1 TABLET BY MOUTH 3 TIMES A WEEK. (Patient taking differently: Take 20 mg by mouth every Monday, Wednesday, and Friday.) 45 tablet 3   gabapentin (NEURONTIN) 300 MG capsule Take 300 mg by mouth 3 (three) times daily.     galantamine (RAZADYNE ER) 8 MG 24 hr capsule Take 8 mg by mouth daily.  2   HYDROcodone-acetaminophen (NORCO/VICODIN) 5-325 MG tablet Take 1 tablet by mouth every 6 (six) hours as needed.     hydrOXYzine (ATARAX/VISTARIL) 25 MG tablet Take 25 mg by mouth at bedtime.     levothyroxine (SYNTHROID, LEVOTHROID) 75 MCG tablet Take 75 mcg by mouth daily.     lidocaine (LIDODERM) 5 % Place 1 patch onto the skin daily. Remove & Discard patch within 12 hours or as directed by MD 30 patch 0   melatonin 3 MG TABS tablet Take 1 tablet (3 mg total) by mouth at bedtime.  0   meloxicam (MOBIC) 7.5 MG tablet Take 1 tablet (7.5 mg total) by mouth daily. 15 tablet 0   metFORMIN (GLUCOPHAGE) 500 MG tablet Take 500 mg by mouth 2 (two) times daily.     methocarbamol (ROBAXIN) 500 MG tablet Take 1 tablet (500 mg total) by mouth 2 (two) times daily. 20 tablet 0   metoprolol succinate (TOPROL-XL) 100 MG 24 hr tablet TAKE 1 TABLET(100 MG) BY MOUTH DAILY (Patient taking differently: Take 100 mg by mouth daily.) 90 tablet 3   Multiple Vitamins-Minerals (ADULT GUMMY PO) Take 1 capsule by mouth daily.     niacin (VITAMIN B3) 100 MG tablet Take 1 tablet (100 mg total) by mouth at bedtime.     nortriptyline (PAMELOR) 50 MG capsule Take 2 capsules (100 mg total) by mouth at bedtime.     pantoprazole (PROTONIX) 40 MG tablet Take 40 mg by mouth 2 (two) times daily.     predniSONE (DELTASONE) 20 MG tablet Take 1 tablet (20 mg total) by mouth daily with breakfast. 30 tablet 3   PRESCRIPTION MEDICATION  Inhale into the lungs at bedtime. CPAP     pyridostigmine (MESTINON) 60 MG tablet Take half-tablet at 10a and 4pm. (Patient taking differently: Take 30 mg by mouth See admin instructions. Take half-tablet by mouth at 10a and 4pm.) 90 tablet 5   sodium chloride (OCEAN) 0.65 % SOLN nasal  spray Place 1 spray into both nostrils every 2 (two) hours as needed for congestion.     spironolactone (ALDACTONE) 25 MG tablet Take 1 tablet (25 mg total) by mouth daily. (Patient taking differently: Take 25 mg by mouth at bedtime.) 30 tablet 11   No current facility-administered medications for this visit.    Allergies  Allergen Reactions   Crestor [Rosuvastatin Calcium] Other (See Comments)    Caused Myalgias   Effexor [Venlafaxine Hydrochloride] Other (See Comments) and Hypertension    Caused BP to go up   Aricept [Donepezil]     Other reaction(s): diarrhea   Cream Base Other (See Comments)    SOUR CREAM ONLY   Latex     Other reaction(s): rash   Nardil [Phenelzine]     Other reaction(s): HTN   Pork-Derived Products    Hibiclens [Chlorhexidine Gluconate] Hives   Lipitor [Atorvastatin Calcium] Rash   Lisinopril Rash    Other reaction(s): rash   Penicillins Swelling and Other (See Comments)    Tolerated Cephalosporin Date: 05/01/22     Plavix [Clopidogrel Bisulfate] Rash   Simvastatin Rash    Other reaction(s): rash   Sulfa Antibiotics Rash   Sulfonamide Derivatives Rash   Tape Rash    Other reaction(s): rash    Review of Systems negative except from HPI and PMH  Physical Exam BP 124/72   Pulse 99   Ht 5\' 1"  (1.549 m)   Wt 154 lb 12.8 oz (70.2 kg)   SpO2 98%   BMI 29.25 kg/m  Well developed and well nourished in no acute distress HENT normal Neck supple with JVP-flat Clear Device pocket well healed; without hematoma or erythema.  There is no tethering  Regular rate and rhythm, no  gallop No murmur Abd-soft with active BS No Clubbing cyanosis  edema Skin-warm and dry A &  Oriented  Grossly normal sensory and motor function impaired speech   ECG sinus @ 99 18/09/32  Device function is normal. Programming changes none  See Paceart for details     Assessment and  Plan  Sinus tachycardia  Sinus bradycardia/tachycardia bradycardia syndrome  Pacemaker Medtronic   Atrial fibrillation-paroxysmal  Hypertension  Myasthenia Gravis with recent crisis  HFpEF  Heart rate well controlled   Continue metoprolol  Euvolemic continue 3/w furosemide     Virl Axe

## 2023-02-21 ENCOUNTER — Inpatient Hospital Stay (HOSPITAL_COMMUNITY)
Admission: EM | Admit: 2023-02-21 | Discharge: 2023-02-27 | DRG: 637 | Disposition: A | Discharge status: Skilled Nursing Facility | Payer: HMO | Attending: Internal Medicine | Admitting: Internal Medicine

## 2023-02-21 ENCOUNTER — Emergency Department (HOSPITAL_COMMUNITY): Payer: HMO

## 2023-02-21 ENCOUNTER — Encounter (HOSPITAL_COMMUNITY): Payer: Self-pay | Admitting: Emergency Medicine

## 2023-02-21 ENCOUNTER — Other Ambulatory Visit: Payer: Self-pay

## 2023-02-21 DIAGNOSIS — Z7982 Long term (current) use of aspirin: Secondary | ICD-10-CM

## 2023-02-21 DIAGNOSIS — Z880 Allergy status to penicillin: Secondary | ICD-10-CM

## 2023-02-21 DIAGNOSIS — Z882 Allergy status to sulfonamides status: Secondary | ICD-10-CM

## 2023-02-21 DIAGNOSIS — F0394 Unspecified dementia, unspecified severity, with anxiety: Secondary | ICD-10-CM | POA: Diagnosis present

## 2023-02-21 DIAGNOSIS — E1110 Type 2 diabetes mellitus with ketoacidosis without coma: Secondary | ICD-10-CM | POA: Diagnosis present

## 2023-02-21 DIAGNOSIS — Z91040 Latex allergy status: Secondary | ICD-10-CM

## 2023-02-21 DIAGNOSIS — Z888 Allergy status to other drugs, medicaments and biological substances status: Secondary | ICD-10-CM

## 2023-02-21 DIAGNOSIS — T380X5A Adverse effect of glucocorticoids and synthetic analogues, initial encounter: Secondary | ICD-10-CM | POA: Diagnosis present

## 2023-02-21 DIAGNOSIS — Z79899 Other long term (current) drug therapy: Secondary | ICD-10-CM

## 2023-02-21 DIAGNOSIS — Z91014 Allergy to mammalian meats: Secondary | ICD-10-CM

## 2023-02-21 DIAGNOSIS — G4733 Obstructive sleep apnea (adult) (pediatric): Secondary | ICD-10-CM | POA: Diagnosis present

## 2023-02-21 DIAGNOSIS — Z7952 Long term (current) use of systemic steroids: Secondary | ICD-10-CM

## 2023-02-21 DIAGNOSIS — E1122 Type 2 diabetes mellitus with diabetic chronic kidney disease: Secondary | ICD-10-CM | POA: Diagnosis present

## 2023-02-21 DIAGNOSIS — E785 Hyperlipidemia, unspecified: Secondary | ICD-10-CM | POA: Diagnosis present

## 2023-02-21 DIAGNOSIS — E0810 Diabetes mellitus due to underlying condition with ketoacidosis without coma: Secondary | ICD-10-CM | POA: Diagnosis not present

## 2023-02-21 DIAGNOSIS — Z7984 Long term (current) use of oral hypoglycemic drugs: Secondary | ICD-10-CM

## 2023-02-21 DIAGNOSIS — F0393 Unspecified dementia, unspecified severity, with mood disturbance: Secondary | ICD-10-CM | POA: Diagnosis present

## 2023-02-21 DIAGNOSIS — F32A Depression, unspecified: Secondary | ICD-10-CM | POA: Diagnosis present

## 2023-02-21 DIAGNOSIS — Z91199 Patient's noncompliance with other medical treatment and regimen due to unspecified reason: Secondary | ICD-10-CM

## 2023-02-21 DIAGNOSIS — Z950 Presence of cardiac pacemaker: Secondary | ICD-10-CM

## 2023-02-21 DIAGNOSIS — E876 Hypokalemia: Secondary | ICD-10-CM | POA: Diagnosis present

## 2023-02-21 DIAGNOSIS — I480 Paroxysmal atrial fibrillation: Secondary | ICD-10-CM | POA: Diagnosis present

## 2023-02-21 DIAGNOSIS — E039 Hypothyroidism, unspecified: Secondary | ICD-10-CM | POA: Diagnosis present

## 2023-02-21 DIAGNOSIS — E1142 Type 2 diabetes mellitus with diabetic polyneuropathy: Secondary | ICD-10-CM | POA: Diagnosis present

## 2023-02-21 DIAGNOSIS — Z87891 Personal history of nicotine dependence: Secondary | ICD-10-CM | POA: Diagnosis not present

## 2023-02-21 DIAGNOSIS — Z1152 Encounter for screening for COVID-19: Secondary | ICD-10-CM

## 2023-02-21 DIAGNOSIS — Z90710 Acquired absence of both cervix and uterus: Secondary | ICD-10-CM

## 2023-02-21 DIAGNOSIS — R1310 Dysphagia, unspecified: Secondary | ICD-10-CM | POA: Diagnosis present

## 2023-02-21 DIAGNOSIS — Z79890 Hormone replacement therapy (postmenopausal): Secondary | ICD-10-CM

## 2023-02-21 DIAGNOSIS — G7001 Myasthenia gravis with (acute) exacerbation: Secondary | ICD-10-CM | POA: Diagnosis not present

## 2023-02-21 DIAGNOSIS — I4891 Unspecified atrial fibrillation: Secondary | ICD-10-CM | POA: Diagnosis present

## 2023-02-21 DIAGNOSIS — I2510 Atherosclerotic heart disease of native coronary artery without angina pectoris: Secondary | ICD-10-CM | POA: Diagnosis not present

## 2023-02-21 DIAGNOSIS — Z791 Long term (current) use of non-steroidal anti-inflammatories (NSAID): Secondary | ICD-10-CM

## 2023-02-21 DIAGNOSIS — I495 Sick sinus syndrome: Secondary | ICD-10-CM | POA: Diagnosis present

## 2023-02-21 DIAGNOSIS — I5032 Chronic diastolic (congestive) heart failure: Secondary | ICD-10-CM | POA: Diagnosis present

## 2023-02-21 DIAGNOSIS — I10 Essential (primary) hypertension: Secondary | ICD-10-CM | POA: Diagnosis not present

## 2023-02-21 DIAGNOSIS — I110 Hypertensive heart disease with heart failure: Secondary | ICD-10-CM | POA: Diagnosis present

## 2023-02-21 DIAGNOSIS — Z96611 Presence of right artificial shoulder joint: Secondary | ICD-10-CM | POA: Diagnosis present

## 2023-02-21 DIAGNOSIS — N179 Acute kidney failure, unspecified: Secondary | ICD-10-CM | POA: Diagnosis present

## 2023-02-21 DIAGNOSIS — K219 Gastro-esophageal reflux disease without esophagitis: Secondary | ICD-10-CM | POA: Diagnosis present

## 2023-02-21 DIAGNOSIS — Z800 Family history of malignant neoplasm of digestive organs: Secondary | ICD-10-CM

## 2023-02-21 DIAGNOSIS — G7000 Myasthenia gravis without (acute) exacerbation: Secondary | ICD-10-CM | POA: Diagnosis not present

## 2023-02-21 LAB — I-STAT VENOUS BLOOD GAS, ED
Acid-base deficit: 7 mmol/L — ABNORMAL HIGH (ref 0.0–2.0)
Bicarbonate: 15.8 mmol/L — ABNORMAL LOW (ref 20.0–28.0)
Calcium, Ion: 1.31 mmol/L (ref 1.15–1.40)
HCT: 41 % (ref 36.0–46.0)
Hemoglobin: 13.9 g/dL (ref 12.0–15.0)
O2 Saturation: 74 %
Potassium: 5 mmol/L (ref 3.5–5.1)
Sodium: 133 mmol/L — ABNORMAL LOW (ref 135–145)
TCO2: 17 mmol/L — ABNORMAL LOW (ref 22–32)
pCO2, Ven: 25.7 mmHg — ABNORMAL LOW (ref 44–60)
pH, Ven: 7.396 (ref 7.25–7.43)
pO2, Ven: 39 mmHg (ref 32–45)

## 2023-02-21 LAB — BASIC METABOLIC PANEL
Anion gap: 23 — ABNORMAL HIGH (ref 5–15)
Anion gap: 19 — ABNORMAL HIGH (ref 5–15)
BUN: 25 mg/dL — ABNORMAL HIGH (ref 8–23)
BUN: 22 mg/dL (ref 8–23)
CO2: 16 mmol/L — ABNORMAL LOW (ref 22–32)
CO2: 17 mmol/L — ABNORMAL LOW (ref 22–32)
Calcium: 11.2 mg/dL — ABNORMAL HIGH (ref 8.9–10.3)
Calcium: 10.2 mg/dL (ref 8.9–10.3)
Chloride: 93 mmol/L — ABNORMAL LOW (ref 98–111)
Chloride: 100 mmol/L (ref 98–111)
Creatinine, Ser: 1.61 mg/dL — ABNORMAL HIGH (ref 0.44–1.00)
Creatinine, Ser: 1.37 mg/dL — ABNORMAL HIGH (ref 0.44–1.00)
GFR, Estimated: 32 mL/min — ABNORMAL LOW (ref >60)
GFR, Estimated: 39 mL/min — ABNORMAL LOW (ref >60)
Glucose, Bld: 737 mg/dL (ref 70–99)
Glucose, Bld: 557 mg/dL (ref 70–99)
Potassium: 5 mmol/L (ref 3.5–5.1)
Potassium: 4.7 mmol/L (ref 3.5–5.1)
Sodium: 132 mmol/L — ABNORMAL LOW (ref 135–145)
Sodium: 136 mmol/L (ref 135–145)

## 2023-02-21 LAB — URINALYSIS, ROUTINE W REFLEX MICROSCOPIC
Bacteria, UA: NONE SEEN
Bilirubin Urine: NEGATIVE
Glucose, UA: >=500 mg/dL — AB
Hgb urine dipstick: NEGATIVE
Ketones, ur: 20 mg/dL — AB
Leukocytes,Ua: NEGATIVE
Nitrite: NEGATIVE
Protein, ur: 30 mg/dL — AB
Specific Gravity, Urine: 1.03 (ref 1.005–1.030)
pH: 5 (ref 5.0–8.0)

## 2023-02-21 LAB — BETA-HYDROXYBUTYRIC ACID: Beta-Hydroxybutyric Acid: 6.85 mmol/L — ABNORMAL HIGH (ref 0.05–0.27)

## 2023-02-21 LAB — CBG MONITORING, ED
Glucose-Capillary: >600 mg/dL (ref 70–99)
Glucose-Capillary: 537 mg/dL (ref 70–99)
Glucose-Capillary: 467 mg/dL — ABNORMAL HIGH (ref 70–99)
Glucose-Capillary: 360 mg/dL — ABNORMAL HIGH (ref 70–99)

## 2023-02-21 LAB — CBC
HCT: 41.3 % (ref 36.0–46.0)
Hemoglobin: 14.3 g/dL (ref 12.0–15.0)
MCH: 30.6 pg (ref 26.0–34.0)
MCHC: 34.6 g/dL (ref 30.0–36.0)
MCV: 88.2 fL (ref 80.0–100.0)
Platelets: 389 10*3/uL (ref 150–400)
RBC: 4.68 MIL/uL (ref 3.87–5.11)
RDW: 14.7 % (ref 11.5–15.5)
WBC: 15.3 10*3/uL — ABNORMAL HIGH (ref 4.0–10.5)
nRBC: 0.2 % (ref 0.0–0.2)

## 2023-02-21 LAB — RESP PANEL BY RT-PCR (RSV, FLU A&B, COVID)  RVPGX2
Influenza A by PCR: NEGATIVE
Influenza B by PCR: NEGATIVE
Resp Syncytial Virus by PCR: NEGATIVE
SARS Coronavirus 2 by RT PCR: NEGATIVE

## 2023-02-21 LAB — OSMOLALITY: Osmolality: 354 mOsm/kg (ref 275–295)

## 2023-02-21 MED ORDER — ASPIRIN 81 MG PO CHEW
81.0000 mg | CHEWABLE_TABLET | Freq: Every day | ORAL | Status: DC
  Administered 2023-02-22 – 2023-02-27 (×6): 81 mg via ORAL
  Filled 2023-02-21 (×6): qty 1

## 2023-02-21 MED ORDER — MELATONIN 3 MG PO TABS
3.0000 mg | ORAL_TABLET | Freq: Every day | ORAL | Status: DC
  Administered 2023-02-22 – 2023-02-26 (×5): 3 mg via ORAL
  Filled 2023-02-21 (×7): qty 1

## 2023-02-21 MED ORDER — PREDNISONE 20 MG PO TABS
20.0000 mg | ORAL_TABLET | Freq: Every day | ORAL | Status: DC
  Administered 2023-02-22 – 2023-02-27 (×6): 20 mg via ORAL
  Filled 2023-02-21 (×6): qty 1

## 2023-02-21 MED ORDER — NORTRIPTYLINE HCL 25 MG PO CAPS
100.0000 mg | ORAL_CAPSULE | Freq: Every day | ORAL | Status: DC
  Administered 2023-02-22 – 2023-02-26 (×6): 100 mg via ORAL
  Filled 2023-02-21 (×7): qty 4

## 2023-02-21 MED ORDER — LACTATED RINGERS IV SOLN: INTRAVENOUS | Status: DC

## 2023-02-21 MED ORDER — GALANTAMINE HYDROBROMIDE ER 8 MG PO CP24
8.0000 mg | ORAL_CAPSULE | Freq: Every day | ORAL | Status: DC
  Administered 2023-02-22 – 2023-02-27 (×5): 8 mg via ORAL
  Filled 2023-02-21 (×6): qty 1

## 2023-02-21 MED ORDER — DEXTROSE IN LACTATED RINGERS 5 % IV SOLN: INTRAVENOUS | Status: DC

## 2023-02-21 MED ORDER — HYDROXYZINE HCL 25 MG PO TABS
25.0000 mg | ORAL_TABLET | Freq: Every day | ORAL | Status: DC
  Administered 2023-02-22 – 2023-02-26 (×5): 25 mg via ORAL
  Filled 2023-02-21 (×7): qty 1

## 2023-02-21 MED ORDER — GABAPENTIN 300 MG PO CAPS
300.0000 mg | ORAL_CAPSULE | Freq: Three times a day (TID) | ORAL | Status: DC
  Administered 2023-02-22 – 2023-02-27 (×18): 300 mg via ORAL
  Filled 2023-02-21 (×19): qty 1

## 2023-02-21 MED ORDER — POTASSIUM CHLORIDE 10 MEQ/100ML IV SOLN
10.0000 meq | INTRAVENOUS | Status: AC
  Administered 2023-02-21 (×2): 10 meq via INTRAVENOUS
  Filled 2023-02-21 (×2): qty 100

## 2023-02-21 MED ORDER — DEXTROSE 50 % IV SOLN: 0.0000 mL | INTRAVENOUS | Status: DC | PRN

## 2023-02-21 MED ORDER — LEVOTHYROXINE SODIUM 75 MCG PO TABS
75.0000 ug | ORAL_TABLET | Freq: Every day | ORAL | Status: DC
  Administered 2023-02-22 – 2023-02-26 (×5): 75 ug via ORAL
  Filled 2023-02-21 (×6): qty 1

## 2023-02-21 MED ORDER — INSULIN REGULAR(HUMAN) IN NACL 100-0.9 UT/100ML-% IV SOLN
INTRAVENOUS | Status: DC
  Administered 2023-02-21: 10.5 [IU]/h via INTRAVENOUS
  Filled 2023-02-21: qty 100

## 2023-02-21 MED ORDER — LACTATED RINGERS IV BOLUS
1500.0000 mL | Freq: Once | INTRAVENOUS | Status: AC
  Administered 2023-02-21: 1500 mL via INTRAVENOUS

## 2023-02-21 MED ORDER — RIVAROXABAN 10 MG PO TABS
10.0000 mg | ORAL_TABLET | Freq: Every day | ORAL | Status: DC
Start: 2023-02-22 — End: 2023-02-21

## 2023-02-21 MED ORDER — SODIUM CHLORIDE 0.9 % IV BOLUS
1000.0000 mL | Freq: Once | INTRAVENOUS | Status: AC
  Administered 2023-02-21: 1000 mL via INTRAVENOUS

## 2023-02-21 MED ORDER — PANTOPRAZOLE SODIUM 40 MG PO TBEC
40.0000 mg | DELAYED_RELEASE_TABLET | Freq: Two times a day (BID) | ORAL | Status: DC
  Administered 2023-02-22 – 2023-02-27 (×12): 40 mg via ORAL
  Filled 2023-02-21 (×12): qty 1

## 2023-02-21 NOTE — ED Triage Notes (Signed)
PT from home via [facility] with reports of weakness. Per EMS pts BS read high on the glucometer. PT with hx of diabetes. PT reports taking metformin. Denies pain.

## 2023-02-21 NOTE — ED Provider Notes (Signed)
[facility] EMERGENCY DEPARTMENT AT [facility] Provider Note   CSN: [id number] Arrival date & time: 02/21/23  1848     History Chief Complaint  Patient presents with   Hyperglycemia    HPI Heidi [name] is a 82 y.o. female presenting for chief complaint of elevated blood sugar.  States that she has had approximately 5 days of progressive malaise fatigue weakness poor p.o. intake and dysuria as well as polyuria.  Denies any fevers chills vomiting but has had nausea to accompany the symptoms. History of diabetes on metformin at baseline.  Has been compliant with all medications.  Ambulatory but endorses intermittent confusion where she wishes wandering around her house..   Patient's recorded medical, surgical, social, medication list and allergies were reviewed in the Snapshot window as part of the initial history.   Review of Systems   Review of Systems  Constitutional:  Negative for chills and fever.  HENT:  Negative for ear pain and sore throat.   Eyes:  Negative for pain and visual disturbance.  Respiratory:  Positive for shortness of breath. Negative for cough.   Cardiovascular:  Negative for chest pain and palpitations.  Gastrointestinal:  Negative for abdominal pain and vomiting.  Genitourinary:  Negative for dysuria and hematuria.  Musculoskeletal:  Negative for arthralgias and back pain.  Skin:  Negative for color change and rash.  Neurological:  Negative for seizures and syncope.  All other systems reviewed and are negative.   Physical Exam Updated Vital Signs BP (!) 156/92 (BP Location: Right Arm)   Pulse (!) 119   Temp 98.1 F (36.7 C) (Oral)   Resp 17   Ht 5\' 1"  (1.549 m)   Wt 66.2 kg   SpO2 97%   BMI 27.58 kg/m  Physical Exam Vitals and nursing note reviewed.  Constitutional:      General: She is not in acute distress.    Appearance: She is well-developed.  HENT:     Head: Normocephalic and atraumatic.  Eyes:     Conjunctiva/sclera:  Conjunctivae normal.  Cardiovascular:     Rate and Rhythm: Normal rate and regular rhythm.     Heart sounds: No murmur heard. Pulmonary:     Effort: Pulmonary effort is normal. No respiratory distress.     Breath sounds: Normal breath sounds.  Abdominal:     General: There is no distension.     Palpations: Abdomen is soft.     Tenderness: There is no abdominal tenderness. There is no right CVA tenderness or left CVA tenderness.  Musculoskeletal:        General: No swelling or tenderness. Normal range of motion.     Cervical back: Neck supple.  Skin:    General: Skin is warm and dry.  Neurological:     General: No focal deficit present.     Mental Status: She is alert and oriented to person, place, and time. Mental status is at baseline.     Cranial Nerves: No cranial nerve deficit.      ED Course/ Medical Decision Making/ A&P    Procedures .Critical Care  Performed by: Glyn Ade, MD Authorized by: Glyn Ade, MD   Critical care provider statement:    Critical care time (minutes):  90   Critical care was necessary to treat or prevent imminent or life-threatening deterioration of the following conditions:  Metabolic crisis and dehydration   Critical care was time spent personally by me on the following activities:  Development of treatment  plan with patient or surrogate, discussions with consultants, evaluation of patient's response to treatment, examination of patient, ordering and review of laboratory studies, ordering and review of radiographic studies, ordering and performing treatments and interventions, pulse oximetry, re-evaluation of patient's condition and review of old charts   Care discussed with: admitting provider      Medications Ordered in ED Medications  insulin regular, human (MYXREDLIN) 100 units/ 100 mL infusion (10.5 Units/hr Intravenous New Bag/Given 02/21/23 2129)  lactated ringers infusion (has no administration in time range)  dextrose 5  % in lactated ringers infusion (has no administration in time range)  dextrose 50 % solution 0-50 mL (has no administration in time range)  potassium chloride 10 mEq in 100 mL IVPB (10 mEq Intravenous New Bag/Given 02/21/23 2106)  sodium chloride 0.9 % bolus 1,000 mL ( Intravenous Stopped 02/21/23 2056)  lactated ringers bolus 1,500 mL (1,500 mLs Intravenous New Bag/Given 02/21/23 2105)    Medical Decision Making:    Heidi [name] is a 82 y.o. female who presented to the ED today with chief complaint of elevated blood glucose and developing malaise detailed above.     Complete initial physical exam performed, notably the patient  was tachycardic otherwise in no acute distress.      Reviewed and confirmed nursing documentation for past medical history, family history, social history.    Initial Assessment:   With the patient's presentation of malaise fatigue, most likely diagnosis is diabetic emergency including HHS versus DKA. Other diagnoses were considered including (but not limited to) underlying infection including UTI, pulmonary infection etc.. These are considered less likely due to history of present illness and physical exam findings.   This is most consistent with an acute life/limb threatening illness complicated by underlying chronic conditions.  Initial Plan:  Resuscitation with IV fluid, beta hydroxybutyrate level and serum osmolality for differentiation of underlying diabetic emergency Screening labs including CBC and Metabolic panel to evaluate for infectious or metabolic etiology of disease.  Urinalysis with reflex culture ordered to evaluate for UTI or relevant urologic/nephrologic pathology.  CXR to evaluate for structural/infectious intrathoracic pathology.  EKG to evaluate for cardiac pathology. Objective evaluation as below reviewed with plan for close reassessment  Initial Study Results:   Laboratory  Critical laboratory findings of hyperglycemia, metabolic anion gap  acidosis and leukocytosis. AKI also identified  EKG EKG was reviewed independently. Rate, rhythm, axis, intervals all examined and without medically relevant abnormality. ST segments without concerns for elevations.    Radiology  All images reviewed independently. Agree with radiology report at this time.   DG Chest Portable 1 View  Result Date: 02/21/2023 CLINICAL DATA:  Weakness EXAM: PORTABLE CHEST 1 VIEW COMPARISON:  03/27/2022 FINDINGS: Lungs are clear. No pneumothorax or pleural effusion. Cardiac size within normal limits. Pulmonary vascularity is normal. 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 No acute bone abnormality. IMPRESSION: No active disease. Electronically Signed   By: Helyn Numbers M.D.   On: 02/21/2023 20:51     Consults:  Case discussed with hospitalist.   Final Assessment and Plan:   Patient diagnosed with diabetic ketoacidosis of uncertain underlying etiology possibly medication failure.  No identified infectious pathology as etiology. Started on insulin drip with potassium supplementation, IV fluid resuscitation and arrange for admission to the progressive care unit.Marland Kitchen   Disposition:   Based on the above findings, I believe this patient is stable for admission.    Patient/family educated about specific findings  on our evaluation and explained exact reasons for admission.  Patient/family educated about clinical situation and time was allowed to answer questions.   Admission team communicated with and agreed with need for admission. Patient admitted. Patient ready to move at this time.     Emergency Department Medication Summary:   Medications  insulin regular, human (MYXREDLIN) 100 units/ 100 mL infusion (10.5 Units/hr Intravenous New Bag/Given 02/21/23 2129)  lactated ringers infusion (has no administration in time range)  dextrose 5 % in lactated ringers infusion (has no administration in time range)   dextrose 50 % solution 0-50 mL (has no administration in time range)  potassium chloride 10 mEq in 100 mL IVPB (10 mEq Intravenous New Bag/Given 02/21/23 2106)  sodium chloride 0.9 % bolus 1,000 mL ( Intravenous Stopped 02/21/23 2056)  lactated ringers bolus 1,500 mL (1,500 mLs Intravenous New Bag/Given 02/21/23 2105)         Clinical Impression:  1. 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)    Rx / DC Orders ED Discharge Orders     None         Glyn Ade, MD 02/21/23 2133

## 2023-02-21 NOTE — H&P (Signed)
History and Physical    Heidi [name] UJW:[id number] DOB: 10/12/1940 DOA: 02/21/2023  PCP: Uvaldo Bristle, NP   Patient coming from: ALF   Chief Complaint: General weakness, high blood sugar   HPI: Heidi [name] is a pleasant 82 y.o. female with medical history significant for type 2 diabetes mellitus, CAD, atrial fibrillation not on anticoagulation, myasthenia gravis, hypertension, depression, and OSA on CPAP who presents to the emergency department for evaluation of generalized weakness and elevated blood glucose.  Patient reports adherence with her metformin but has not been checking her glucose and a year or so.  She was started on 20 mg prednisone daily 2 or 3 months ago for myasthenia gravis.  She has some chronic dysphagia that she attributes to myasthenia gravis, and due to this she has been eating mainly ice cream and fruit, and drinking sweet tea.  She has developed progressive generalized weakness and malaise over the past 2 weeks, had her blood glucose checked today, glucometer read "high," and she was sent to the ED.  She denies abdominal pain, nausea, vomiting, diarrhea, chest pain, cough, shortness of breath, fever, or chills.  ED Course: Upon arrival to the ED, patient is found to be afebrile and saturating well on room air with mild tachycardia and stable blood pressure.  Labs are most notable for glucose of 137, bicarbonate 16, anion gap 23, and creatinine 1.61.   She was given 2.5 L of IV fluids and started on IV insulin infusion in the ED.  Review of Systems:  All other systems reviewed and apart from HPI, are negative.  Past Medical History:  Diagnosis Date   Anxiety    Atrial fibrillation (HCC)    Intermittent   CAD (coronary artery disease)    non obstructive   CKD (chronic kidney disease)    Diabetes mellitus without complication (HCC)    Dysrhythmia    PAF   GERD (gastroesophageal reflux disease)    Glaucoma    Narrow Angle   Heart murmur    HTN  (hypertension)    Hyperlipidemia    Hypothyroid    Major depression    10-17-2010: hospitalized for suicidal, [facility] (D/C 10-30-2010)   Migraine headache    OSA (obstructive sleep apnea)    CPAP nightly   Peripheral neuropathy    Presence of permanent cardiac pacemaker    Seasonal allergies    Sleep apnea    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    ABDOMINAL HYSTERECTOMY  1976   APPENDECTOMY     BREAST EXCISIONAL BIOPSY     BREAST SURGERY     fibroids removed, benign   CARDIAC CATHETERIZATION  02-12-2006   Minor irregularities: RCA-mid 40%, LM-normal, LAD-normal. EF 65%.   CHOLECYSTECTOMY     ECTOPIC PREGNANCY SURGERY     treatment x3, last one 11-02-2009 at [facility]   goiter     goiter removed     MASS EXCISION Right 10/18/2015   Procedure: EXCISION OF 6CM MASS ON RIGHT  NECK/BACK;  Surgeon: Luretha Murphy, MD;  Location: [facility];  Service: General;  Laterality: Right;   PACEMAKER GENERATOR CHANGE N/A 01/15/2012   Procedure: PACEMAKER GENERATOR CHANGE;  Surgeon: Duke Salvia, MD;  Location: [facility] CATH LAB;  Service: Cardiovascular;  Laterality: N/A;  PACEMAKER INSERTION     Permanent. [employer] EnRhyth 04/2006, set as DDDR   REVERSE SHOULDER ARTHROPLASTY Right 05/01/2022   Procedure: REVERSE SHOULDER ARTHROPLASTY;  Surgeon: Bjorn Pippin, MD;  Location: [facility];  Service: Orthopedics;  Laterality: Right;   TUMOR REMOVAL  2000   Fibroid from breast x4-most recent   WRIST FRACTURE SURGERY  11-2007   right, had metal plate inserted    Social History:   reports that she has quit smoking. She has never used smokeless tobacco. She reports that she does not drink alcohol and does not use drugs.  Allergies  Allergen Reactions   Crestor [Rosuvastatin  Calcium] Other (See Comments)    Caused Myalgias   Effexor [Venlafaxine Hydrochloride] Other (See Comments) and Hypertension    Caused BP to go up   Aricept [Donepezil]     Other reaction(s): diarrhea   Cream Base Other (See Comments)    SOUR CREAM ONLY   Latex     Other reaction(s): rash   Nardil [Phenelzine]     Other reaction(s): HTN   Pork-Derived Products    Hibiclens [Chlorhexidine Gluconate] Hives   Lipitor [Atorvastatin Calcium] Rash   Lisinopril Rash    Other reaction(s): rash   Penicillins Swelling and Other (See Comments)    Tolerated Cephalosporin Date: 05/01/22     Plavix [Clopidogrel Bisulfate] Rash   Simvastatin Rash    Other reaction(s): rash   Sulfa Antibiotics Rash   Sulfonamide Derivatives Rash   Tape Rash    Other reaction(s): rash    Family History  Problem Relation Age of Onset   Stomach cancer Father    Atrial fibrillation Brother      Prior to Admission medications   Medication Sig Start Date End Date Taking? Authorizing Provider  acetaminophen (TYLENOL) 325 MG tablet Take 2 tablets (650 mg total) by mouth every 6 (six) hours as needed for mild pain (or Fever >/= 101). 05/30/22   Elgergawy, Leana Roe, MD  ASPIRIN LOW DOSE 81 MG chewable tablet Chew 81 mg by mouth daily. 11/07/22   [provider]  Cholecalciferol (VITAMIN D-3) 125 MCG (5000 UT) TABS Take 5,000 Units by mouth daily.    [provider]  cholestyramine (QUESTRAN) 4 g packet Take 4 g by mouth daily. Make take 1 additional packet as needed for diarrhea 08/13/18   [provider]  cyanocobalamin 1000 MCG tablet Take 1 tablet (1,000 mcg total) by mouth daily. 05/31/22   Elgergawy, Leana Roe, MD  fenofibrate 160 MG tablet Take 160 mg by mouth daily. Take with food 12/30/18   [provider]  ferrous sulfate 325 (65 FE) MG tablet Take 325 mg by mouth daily.    [provider]  fexofenadine-pseudoephedrine (ALLEGRA-D) 60-120 MG 12 hr tablet Take 1 tablet  by mouth 2 (two) times daily.    [provider]  fluticasone (FLONASE) 50 MCG/ACT nasal spray Place 1 spray into the nose 2 (two) times daily.    [provider]  furosemide (LASIX) 20 MG tablet TAKE 1 TABLET BY MOUTH 3 TIMES A WEEK. Patient taking differently: Take 20 mg by mouth every Monday, Wednesday, and Friday. 05/07/21   Jake Bathe, MD  gabapentin (NEURONTIN) 300 MG capsule Take 300 mg by mouth 3 (three) times daily.    [provider]  galantamine (RAZADYNE ER) 8 MG 24 hr capsule Take 8 mg by mouth daily. 08/13/18   [provider]  HYDROcodone-acetaminophen (NORCO/VICODIN) 5-325 MG tablet Take 1  tablet by mouth every 6 (six) hours as needed. 12/17/22   [provider]  hydrOXYzine (ATARAX/VISTARIL) 25 MG tablet Take 25 mg by mouth at bedtime.    [provider]  levothyroxine (SYNTHROID, LEVOTHROID) 75 MCG tablet Take 75 mcg by mouth daily.    [provider]  lidocaine (LIDODERM) 5 % Place 1 patch onto the skin daily. Remove & Discard patch within 12 hours or as directed by MD 11/17/22   Gilda Crease, MD  melatonin 3 MG TABS tablet Take 1 tablet (3 mg total) by mouth at bedtime. 05/30/22   Elgergawy, Leana Roe, MD  meloxicam (MOBIC) 7.5 MG tablet Take 1 tablet (7.5 mg total) by mouth daily. 11/17/22   Gilda Crease, MD  metFORMIN (GLUCOPHAGE) 500 MG tablet Take 500 mg by mouth 2 (two) times daily.    [provider]  methocarbamol (ROBAXIN) 500 MG tablet Take 1 tablet (500 mg total) by mouth 2 (two) times daily. 11/17/22   Gilda Crease, MD  metoprolol succinate (TOPROL-XL) 100 MG 24 hr tablet TAKE 1 TABLET(100 MG) BY MOUTH DAILY Patient taking differently: Take 100 mg by mouth daily. 04/25/21   Jake Bathe, MD  Multiple Vitamins-Minerals (ADULT GUMMY PO) Take 1 capsule by mouth daily.    [provider]  niacin (VITAMIN B3) 100 MG tablet Take 1 tablet (100 mg total) by mouth at  bedtime. 05/30/22   Elgergawy, Leana Roe, MD  nortriptyline (PAMELOR) 50 MG capsule Take 2 capsules (100 mg total) by mouth at bedtime. 05/30/22   Elgergawy, Leana Roe, MD  pantoprazole (PROTONIX) 40 MG tablet Take 40 mg by mouth 2 (two) times daily. 06/01/18   [provider]  predniSONE (DELTASONE) 20 MG tablet Take 1 tablet (20 mg total) by mouth daily with breakfast. 10/30/22   Nita Sickle K, DO  PRESCRIPTION MEDICATION Inhale into the lungs at bedtime. CPAP    [provider]  pyridostigmine (MESTINON) 60 MG tablet Take half-tablet at 10a and 4pm. Patient taking differently: Take 30 mg by mouth See admin instructions. Take half-tablet by mouth at 10a and 4pm. 10/30/22   Nita Sickle K, DO  sodium chloride (OCEAN) 0.65 % SOLN nasal spray Place 1 spray into both nostrils every 2 (two) hours as needed for congestion.    [provider]  spironolactone (ALDACTONE) 25 MG tablet Take 1 tablet (25 mg total) by mouth daily. Patient taking differently: Take 25 mg by mouth at bedtime. 10/03/17   Duke Salvia, MD    Physical Exam: Vitals:   02/21/23 1848 02/21/23 2125  BP: (!) 156/92   Pulse: (!) 119   Resp: 17   Temp: 98.1 F (36.7 C)   TempSrc: Oral   SpO2: 97%   Weight:  66.2 kg  Height:  5\' 1"  (1.549 m)     Constitutional: NAD, calm  Eyes: PERTLA, lids and conjunctivae normal ENMT: Mucous membranes are moist. Posterior pharynx clear of any exudate or lesions.   Neck: supple, no masses  Respiratory: no wheezing, no crackles. No accessory muscle use.  Cardiovascular: S1 & S2 heard, regular rate and rhythm. No JVD. Abdomen: No distension, no tenderness, soft. Bowel sounds active.  Musculoskeletal: no clubbing / cyanosis. No joint deformity upper and lower extremities.   Skin: no significant rashes, lesions, ulcers. Warm, dry, well-perfused. Neurologic: CN 2-12 grossly intact. Moving all extremities. Alert and oriented.  Psychiatric: Pleasant. Cooperative.     Labs and Imaging on Admission: I have  personally reviewed following labs and imaging studies  CBC: Recent Labs  Lab 02/21/23 1859 02/21/23 1937  WBC 15.3*  --   HGB 14.3 13.9  HCT 41.3 41.0  MCV 88.2  --   PLT 389  --    Basic Metabolic Panel: Recent Labs  Lab 02/21/23 1859 02/21/23 1937  NA 132* 133*  K 5.0 5.0  CL 93*  --   CO2 16*  --   GLUCOSE 737*  --   BUN 25*  --   CREATININE 1.61*  --   CALCIUM 11.2*  --    GFR: Estimated Creatinine Clearance: 23.9 mL/min (A) (by C-G formula based on SCr of 1.61 mg/dL (H)). Liver Function Tests: No results for input(s): "AST", "ALT", "ALKPHOS", "BILITOT", "PROT", "ALBUMIN" in the last 168 hours. No results for input(s): "LIPASE", "AMYLASE" in the last 168 hours. No results for input(s): "AMMONIA" in the last 168 hours. Coagulation Profile: No results for input(s): "INR", "PROTIME" in the last 168 hours. Cardiac Enzymes: No results for input(s): "CKTOTAL", "CKMB", "CKMBINDEX", "TROPONINI" in the last 168 hours. BNP (last 3 results) No results for input(s): "PROBNP" in the last 8760 hours. HbA1C: No results for input(s): "HGBA1C" in the last 72 hours. CBG: Recent Labs  Lab 02/21/23 1852 02/21/23 2123 02/21/23 2229  GLUCAP >600* 537* 467*   Lipid Profile: No results for input(s): "CHOL", "HDL", "LDLCALC", "TRIG", "CHOLHDL", "LDLDIRECT" in the last 72 hours. Thyroid Function Tests: No results for input(s): "TSH", "T4TOTAL", "FREET4", "T3FREE", "THYROIDAB" in the last 72 hours. Anemia Panel: No results for input(s): "VITAMINB12", "FOLATE", "FERRITIN", "TIBC", "IRON", "RETICCTPCT" in the last 72 hours. Urine analysis:    Component Value Date/Time   COLORURINE YELLOW 02/21/2023 2015   APPEARANCEUR CLEAR 02/21/2023 2015   LABSPEC 1.030 02/21/2023 2015   PHURINE 5.0 02/21/2023 2015   GLUCOSEU >=500 (A) 02/21/2023 2015   HGBUR NEGATIVE 02/21/2023 2015   BILIRUBINUR NEGATIVE 02/21/2023 2015   KETONESUR 20 (A)  02/21/2023 2015   PROTEINUR 30 (A) 02/21/2023 2015   UROBILINOGEN 1.0 10/17/2010 1558   NITRITE NEGATIVE 02/21/2023 2015   LEUKOCYTESUR NEGATIVE 02/21/2023 2015   Sepsis Labs: @LABRCNTIP (procalcitonin:4,lacticidven:4) ) Recent Results (from the past 240 hour(s))  Resp panel by RT-PCR (RSV, Flu A&B, Covid) Anterior Nasal Swab     Status: None   Collection Time: 02/21/23  8:36 PM   Specimen: Anterior Nasal Swab  Result Value Ref Range Status   SARS Coronavirus 2 by RT PCR NEGATIVE NEGATIVE Final   Influenza A by PCR NEGATIVE NEGATIVE Final   Influenza B by PCR NEGATIVE NEGATIVE Final    Comment: (NOTE) The Xpert Xpress SARS-CoV-2/FLU/RSV plus assay is intended as an aid in the diagnosis of influenza from Nasopharyngeal swab specimens and should not be used as a sole basis for treatment. Nasal washings and aspirates are unacceptable for Xpert Xpress SARS-CoV-2/FLU/RSV testing.  Fact Sheet for Patients: BloggerCourse.com  Fact Sheet for Healthcare Providers: SeriousBroker.it  This test is not yet approved or cleared by the [place] FDA and has been authorized for detection and/or diagnosis of SARS-CoV-2 by FDA under an Emergency Use Authorization (EUA). This EUA will remain in effect (meaning this test can be used) for the duration of the COVID-19 declaration under Section 564(b)(1) of the Act, 21 U.S.C. section 360bbb-3(b)(1), unless the authorization is terminated or revoked.     Resp Syncytial Virus by PCR NEGATIVE NEGATIVE Final    Comment: (NOTE) Fact Sheet for Patients: BloggerCourse.com  Fact Sheet for Healthcare Providers:  SeriousBroker.it  This test is not yet approved or cleared by the [place] and has been authorized for detection and/or diagnosis of SARS-CoV-2 by FDA under an Emergency Use Authorization (EUA). This EUA will remain in effect (meaning  this test can be used) for the duration of the COVID-19 declaration under Section 564(b)(1) of the Act, 21 U.S.C. section 360bbb-3(b)(1), unless the authorization is terminated or revoked.  Performed at [facility], [address], [place], [place] [address]      Radiological Exams on Admission: DG Chest Portable 1 View  Result Date: 02/21/2023 CLINICAL DATA:  Weakness EXAM: PORTABLE CHEST 1 VIEW COMPARISON:  03/27/2022 FINDINGS: Lungs are clear. No pneumothorax or pleural effusion. Cardiac size within normal limits. Pulmonary vascularity is normal. 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 No acute bone abnormality. IMPRESSION: No active disease. Electronically Signed   By: Helyn Numbers M.D.   On: 02/21/2023 20:51    EKG: Independently reviewed. Sinus tachycardia, rate 120.   Assessment/Plan   1. DKA  - A1c was 7.4% in February, she uses metformin only, does not check CBGs at home, was started on 20 mg prednisone daily 2-3 mos ago, and has been consuming a lot of ice cream and sweet tea  - Continue IVF hydration and IV insulin infusion with frequent CBGs and serial chem panels    2. AKI   - SCr is 1.61 on admission, up from 0.76 in February  - Hold diuretics, continue IVF hydration, repeat chem panel   3. CAD  - No angina  - Continue ASA and statin   4. Depression, anxiety, dementia  - Continue galantamine, nortriptyline, and hydroxyzine, use delirium precautions    5. Chronic diastolic CHF  - Hold diuretics while hydrating, monitor wt and I/Os    6. Myasthenia gravis  - Not in crisis on admission  - Continue Mestinon and prednisone   7. Hypothyroidism  - Continue Synthroid    8. OSA  - CPAP qHS   9. A fib  - She had a single brief episode during a hospitalization and is not anticoagulated   - In SR on admission    DVT prophylaxis: Xarelto  Code Status: Full   Level of Care: Level of care:  Progressive Family Communication:Daughter at bedside   Disposition Plan:  Patient is from: ALF  Anticipated d/c is to: ALF  Anticipated d/c date is: 02/23/23  Patient currently: Pending glycemic-control, stable renal function  Consults called: none  Admission status: Inpatient   Briscoe Deutscher, MD [facility]  02/21/2023, 10:54 PM

## 2023-02-22 DIAGNOSIS — E0810 Diabetes mellitus due to underlying condition with ketoacidosis without coma: Secondary | ICD-10-CM | POA: Diagnosis not present

## 2023-02-22 LAB — BASIC METABOLIC PANEL
Anion gap: 9 (ref 5–15)
Anion gap: 9 (ref 5–15)
Anion gap: 9 (ref 5–15)
Anion gap: 11 (ref 5–15)
BUN: 18 mg/dL (ref 8–23)
BUN: 16 mg/dL (ref 8–23)
BUN: 18 mg/dL (ref 8–23)
BUN: 15 mg/dL (ref 8–23)
CO2: 24 mmol/L (ref 22–32)
CO2: 24 mmol/L (ref 22–32)
CO2: 24 mmol/L (ref 22–32)
CO2: 24 mmol/L (ref 22–32)
Calcium: 9.7 mg/dL (ref 8.9–10.3)
Calcium: 9.8 mg/dL (ref 8.9–10.3)
Calcium: 9.7 mg/dL (ref 8.9–10.3)
Calcium: 9.4 mg/dL (ref 8.9–10.3)
Chloride: 106 mmol/L (ref 98–111)
Chloride: 105 mmol/L (ref 98–111)
Chloride: 104 mmol/L (ref 98–111)
Chloride: 100 mmol/L (ref 98–111)
Creatinine, Ser: 0.99 mg/dL (ref 0.44–1.00)
Creatinine, Ser: 0.92 mg/dL (ref 0.44–1.00)
Creatinine, Ser: 0.88 mg/dL (ref 0.44–1.00)
Creatinine, Ser: 0.82 mg/dL (ref 0.44–1.00)
GFR, Estimated: 57 mL/min — ABNORMAL LOW (ref >60)
GFR, Estimated: >60 mL/min (ref >60)
GFR, Estimated: >60 mL/min (ref >60)
GFR, Estimated: >60 mL/min (ref >60)
Glucose, Bld: 231 mg/dL — ABNORMAL HIGH (ref 70–99)
Glucose, Bld: 188 mg/dL — ABNORMAL HIGH (ref 70–99)
Glucose, Bld: 186 mg/dL — ABNORMAL HIGH (ref 70–99)
Glucose, Bld: 342 mg/dL — ABNORMAL HIGH (ref 70–99)
Potassium: 3.8 mmol/L (ref 3.5–5.1)
Potassium: 3.4 mmol/L — ABNORMAL LOW (ref 3.5–5.1)
Potassium: 3.7 mmol/L (ref 3.5–5.1)
Potassium: 4.3 mmol/L (ref 3.5–5.1)
Sodium: 139 mmol/L (ref 135–145)
Sodium: 138 mmol/L (ref 135–145)
Sodium: 137 mmol/L (ref 135–145)
Sodium: 135 mmol/L (ref 135–145)

## 2023-02-22 LAB — CBG MONITORING, ED
Glucose-Capillary: 161 mg/dL — ABNORMAL HIGH (ref 70–99)
Glucose-Capillary: 164 mg/dL — ABNORMAL HIGH (ref 70–99)
Glucose-Capillary: 168 mg/dL — ABNORMAL HIGH (ref 70–99)
Glucose-Capillary: 174 mg/dL — ABNORMAL HIGH (ref 70–99)
Glucose-Capillary: 177 mg/dL — ABNORMAL HIGH (ref 70–99)
Glucose-Capillary: 179 mg/dL — ABNORMAL HIGH (ref 70–99)
Glucose-Capillary: 185 mg/dL — ABNORMAL HIGH (ref 70–99)
Glucose-Capillary: 201 mg/dL — ABNORMAL HIGH (ref 70–99)
Glucose-Capillary: 206 mg/dL — ABNORMAL HIGH (ref 70–99)
Glucose-Capillary: 207 mg/dL — ABNORMAL HIGH (ref 70–99)
Glucose-Capillary: 233 mg/dL — ABNORMAL HIGH (ref 70–99)
Glucose-Capillary: 316 mg/dL — ABNORMAL HIGH (ref 70–99)

## 2023-02-22 LAB — GLUCOSE, CAPILLARY
Glucose-Capillary: 108 mg/dL — ABNORMAL HIGH (ref 70–99)
Glucose-Capillary: 206 mg/dL — ABNORMAL HIGH (ref 70–99)
Glucose-Capillary: 412 mg/dL — ABNORMAL HIGH (ref 70–99)

## 2023-02-22 MED ORDER — INSULIN GLARGINE-YFGN 100 UNIT/ML ~~LOC~~ SOLN
20.0000 [IU] | Freq: Every day | SUBCUTANEOUS | Status: DC
  Administered 2023-02-22 – 2023-02-23 (×2): 20 [IU] via SUBCUTANEOUS
  Filled 2023-02-22 (×3): qty 0.2

## 2023-02-22 MED ORDER — POTASSIUM CHLORIDE 2 MEQ/ML IV SOLN
INTRAVENOUS | Status: DC
  Filled 2023-02-22 (×2): qty 1000

## 2023-02-22 MED ORDER — INSULIN ASPART 100 UNIT/ML IJ SOLN
0.0000 [IU] | INTRAMUSCULAR | Status: DC
  Administered 2023-02-22: 12 [IU] via SUBCUTANEOUS
  Administered 2023-02-22: 3 [IU] via SUBCUTANEOUS
  Administered 2023-02-22: 5 [IU] via SUBCUTANEOUS
  Administered 2023-02-23: 11 [IU] via SUBCUTANEOUS
  Administered 2023-02-23: 2 [IU] via SUBCUTANEOUS
  Administered 2023-02-23: 3 [IU] via SUBCUTANEOUS
  Administered 2023-02-23 – 2023-02-24 (×2): 11 [IU] via SUBCUTANEOUS
  Administered 2023-02-24: 5 [IU] via SUBCUTANEOUS
  Administered 2023-02-24: 3 [IU] via SUBCUTANEOUS
  Administered 2023-02-24 – 2023-02-25 (×5): 5 [IU] via SUBCUTANEOUS
  Administered 2023-02-25 – 2023-02-26 (×5): 3 [IU] via SUBCUTANEOUS
  Administered 2023-02-27: 2 [IU] via SUBCUTANEOUS
  Administered 2023-02-27: 5 [IU] via SUBCUTANEOUS

## 2023-02-22 MED ORDER — INSULIN GLARGINE-YFGN 100 UNIT/ML ~~LOC~~ SOLN
20.0000 [IU] | SUBCUTANEOUS | Status: AC
  Administered 2023-02-22: 20 [IU] via SUBCUTANEOUS
  Filled 2023-02-22: qty 0.2

## 2023-02-22 MED ORDER — METFORMIN HCL 500 MG PO TABS
1000.0000 mg | ORAL_TABLET | Freq: Two times a day (BID) | ORAL | Status: DC
  Administered 2023-02-22 – 2023-02-27 (×11): 1000 mg via ORAL
  Filled 2023-02-22 (×11): qty 2

## 2023-02-22 MED ORDER — INSULIN GLARGINE-YFGN 100 UNIT/ML ~~LOC~~ SOLN: 10.0000 [IU] | SUBCUTANEOUS | Status: DC

## 2023-02-22 NOTE — Progress Notes (Addendum)
PROGRESS NOTE    Heidi [name]  ONG:[id number] DOB: August 27, 1941 DOA: 02/21/2023 PCP: Uvaldo Bristle, NP  Outpatient Specialists:     Brief Narrative:  Patient is an 82 year old female with past medical history significant for type 2 diabetes mellitus, coronary artery disease, atrial fibrillation but not anticoagulated, myasthenia gravis, hypertension, depression, and OSA on CPAP.  Patient was admitted with DKA.  Apparently, patient has been on metformin, but patient stopped checking blood sugar about a year ago.  On presentation to the hospital, blood sugar was 737 with anion gap of 23.  02/22/2023: Patient has been actively managed for DKA.  Anion gap is closed.  Patient has been transitioned to Accu-Cheks with sliding scale coverage is.  Patient is also on subcutaneous Semglee 20 units once daily.   Assessment & Plan:   Principal Problem:   DKA (diabetic ketoacidosis) (HCC) Active Problems:   A-fib (HCC)   HTN (hypertension)   Hypothyroidism   CAD (coronary artery disease)   Chronic diastolic CHF (congestive heart failure) (HCC)   Myasthenia gravis (HCC)   AKI (acute kidney injury) (HCC)   1. DKA  -Anion gap is closed. -Patient has been transitioned to subcutaneous Semglee and sliding scale insulin coverage. -Likely discharge in the next 24 to 48 hours.    2. AKI   - SCr is 1.61 on admission, up from 0.76 in February  - Hold diuretics, continue IVF hydration, repeat chem panel 02/22/2023: AKI has resolved.   3. CAD  - No angina  - Continue ASA and statin    4. Depression, anxiety, dementia  - Continue galantamine, nortriptyline, and hydroxyzine, use delirium precautions     5. Chronic diastolic CHF  -Compensated.   6. Myasthenia gravis  - Not in crisis on admission  - Continue Mestinon and prednisone    7. Hypothyroidism  - Continue Synthroid     8. OSA  - CPAP qHS    9. A fib  - She had a single brief episode during a hospitalization and is not anticoagulated    - In SR on admission        DVT prophylaxis: SCD. Code Status: Full code. Family Communication:  Disposition Plan: Home eventually   Consultants:  None.  Procedures:  None.  Antimicrobials:  None.   Subjective: No new complaints.  Objective: Vitals:   02/22/23 0600 02/22/23 0700 02/22/23 0800 02/22/23 0900  BP: 137/66 122/65 130/69 (!) 145/69  Pulse: 91 85 90 90  Resp: 13  16   Temp:   98 F (36.7 C)   TempSrc:      SpO2: 100% 99% 100% 100%  Weight:      Height:        Intake/Output Summary (Last 24 hours) at 02/22/2023 1000 Last data filed at 02/22/2023 0024 Gross per 24 hour  Intake 1356.89 ml  Output --  Net 1356.89 ml   Filed Weights   02/21/23 2125  Weight: 66.2 kg    Examination:  General exam: Appears calm and comfortable  Respiratory system: Clear to auscultation. Cardiovascular system: S1 & S2 heard Gastrointestinal system: Abdomen is soft and nontender. Central nervous system: Awake and alert.  Moves all extremities.   Extremities: No leg edema.  Data Reviewed: I have personally reviewed following labs and imaging studies  CBC: Recent Labs  Lab 02/21/23 1859 02/21/23 1937  WBC 15.3*  --   HGB 14.3 13.9  HCT 41.3 41.0  MCV 88.2  --   PLT 389  --  Basic Metabolic Panel: Recent Labs  Lab 02/21/23 1859 02/21/23 1937 02/21/23 2120 02/22/23 0035 02/22/23 0432 02/22/23 0826  NA 132* 133* 136 139 138 137  K 5.0 5.0 4.7 3.8 3.4* 3.7  CL 93*  --  100 106 105 104  CO2 16*  --  17* 24 24 24   GLUCOSE 737*  --  557* 231* 188* 186*  BUN 25*  --  22 18 16 18   CREATININE 1.61*  --  1.37* 0.99 0.92 0.88  CALCIUM 11.2*  --  10.2 9.7 9.8 9.7   GFR: Estimated Creatinine Clearance: 43.7 mL/min (by C-G formula based on SCr of 0.88 mg/dL). Liver Function Tests: No results for input(s): "AST", "ALT", "ALKPHOS", "BILITOT", "PROT", "ALBUMIN" in the last 168 hours. No results for input(s): "LIPASE", "AMYLASE" in the last 168 hours. No  results for input(s): "AMMONIA" in the last 168 hours. Coagulation Profile: No results for input(s): "INR", "PROTIME" in the last 168 hours. Cardiac Enzymes: No results for input(s): "CKTOTAL", "CKMB", "CKMBINDEX", "TROPONINI" in the last 168 hours. BNP (last 3 results) No results for input(s): "PROBNP" in the last 8760 hours. HbA1C: No results for input(s): "HGBA1C" in the last 72 hours. CBG: Recent Labs  Lab 02/22/23 0534 02/22/23 0658 02/22/23 0741 02/22/23 0840 02/22/23 0941  GLUCAP 168* 164* 161* 185* 201*   Lipid Profile: No results for input(s): "CHOL", "HDL", "LDLCALC", "TRIG", "CHOLHDL", "LDLDIRECT" in the last 72 hours. Thyroid Function Tests: No results for input(s): "TSH", "T4TOTAL", "FREET4", "T3FREE", "THYROIDAB" in the last 72 hours. Anemia Panel: No results for input(s): "VITAMINB12", "FOLATE", "FERRITIN", "TIBC", "IRON", "RETICCTPCT" in the last 72 hours. Urine analysis:    Component Value Date/Time   COLORURINE YELLOW 02/21/2023 2015   APPEARANCEUR CLEAR 02/21/2023 2015   LABSPEC 1.030 02/21/2023 2015   PHURINE 5.0 02/21/2023 2015   GLUCOSEU >=500 (A) 02/21/2023 2015   HGBUR NEGATIVE 02/21/2023 2015   BILIRUBINUR NEGATIVE 02/21/2023 2015   KETONESUR 20 (A) 02/21/2023 2015   PROTEINUR 30 (A) 02/21/2023 2015   UROBILINOGEN 1.0 10/17/2010 1558   NITRITE NEGATIVE 02/21/2023 2015   LEUKOCYTESUR NEGATIVE 02/21/2023 2015   Sepsis Labs: @LABRCNTIP (procalcitonin:4,lacticidven:4)  ) Recent Results (from the past 240 hour(s))  Resp panel by RT-PCR (RSV, Flu A&B, Covid) Anterior Nasal Swab     Status: None   Collection Time: 02/21/23  8:36 PM   Specimen: Anterior Nasal Swab  Result Value Ref Range Status   SARS Coronavirus 2 by RT PCR NEGATIVE NEGATIVE Final   Influenza A by PCR NEGATIVE NEGATIVE Final   Influenza B by PCR NEGATIVE NEGATIVE Final    Comment: (NOTE) The Xpert Xpress SARS-CoV-2/FLU/RSV plus assay is intended as an aid in the diagnosis of  influenza from Nasopharyngeal swab specimens and should not be used as a sole basis for treatment. Nasal washings and aspirates are unacceptable for Xpert Xpress SARS-CoV-2/FLU/RSV testing.  Fact Sheet for Patients: BloggerCourse.com  Fact Sheet for Healthcare Providers: SeriousBroker.it  This test is not yet approved or cleared by the [place] FDA and has been authorized for detection and/or diagnosis of SARS-CoV-2 by FDA under an Emergency Use Authorization (EUA). This EUA will remain in effect (meaning this test can be used) for the duration of the COVID-19 declaration under Section 564(b)(1) of the Act, 21 U.S.C. section 360bbb-3(b)(1), unless the authorization is terminated or revoked.     Resp Syncytial Virus by PCR NEGATIVE NEGATIVE Final    Comment: (NOTE) Fact Sheet for Patients: BloggerCourse.com  Fact Sheet for Healthcare  Providers: SeriousBroker.it  This test is not yet approved or cleared by the [place] and has been authorized for detection and/or diagnosis of SARS-CoV-2 by FDA under an Emergency Use Authorization (EUA). This EUA will remain in effect (meaning this test can be used) for the duration of the COVID-19 declaration under Section 564(b)(1) of the Act, 21 U.S.C. section 360bbb-3(b)(1), unless the authorization is terminated or revoked.  Performed at [facility], [address] [place], [place] [address]          Radiology Studies: DG Chest Portable 1 View  Result Date: 02/21/2023 CLINICAL DATA:  Weakness EXAM: PORTABLE CHEST 1 VIEW COMPARISON:  03/27/2022 FINDINGS: Lungs are clear. No pneumothorax or pleural effusion. Cardiac size within normal limits. Pulmonary vascularity is normal. 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 No acute bone abnormality. IMPRESSION: No  active disease. Electronically Signed   By: Helyn Numbers M.D.   On: 02/21/2023 20:51        Scheduled Meds:  aspirin  81 mg Oral Daily   gabapentin  300 mg Oral TID   galantamine  8 mg Oral Daily   hydrOXYzine  25 mg Oral QHS   insulin aspart  0-15 Units Subcutaneous Q4H   [START ON 02/23/2023] insulin glargine-yfgn  10 Units Subcutaneous Q24H   levothyroxine  75 mcg Oral Q0600   melatonin  3 mg Oral QHS   nortriptyline  100 mg Oral QHS   pantoprazole  40 mg Oral BID   predniSONE  20 mg Oral Q breakfast   Continuous Infusions:  dextrose 5 % and 0.9 % NaCl 1,000 mL with potassium chloride 20 mEq infusion     dextrose 5% lactated ringers 75 mL/hr at 02/22/23 0026   lactated ringers Stopped (02/22/23 0024)     LOS: 1 day    Time spent: 35 minutes.    Berton Mount, MD  [facility] Pager #: [phone] 7PM-7AM contact night coverage as above

## 2023-02-22 NOTE — ED Notes (Signed)
ED TO INPATIENT HANDOFF REPORT  ED Nurse Name and Phone #:   S Name/Age/Gender Heidi [name] 82 y.o. female Room/Bed: 027C/027C  Code Status   Code Status: Full Code  Home/SNF/Other Home Patient oriented to: self, place, time, and situation Is this baseline? Yes   Triage Complete: Triage complete  Chief Complaint DKA (diabetic ketoacidosis) (HCC) [E11.10]  Triage Note PT from home via [facility] with reports of weakness. Per EMS pts BS read high on the glucometer. PT with hx of diabetes. PT reports taking metformin. Denies pain.    Allergies Allergies  Allergen Reactions   Crestor [Rosuvastatin Calcium] Other (See Comments)    Caused Myalgias   Effexor [Venlafaxine Hydrochloride] Other (See Comments) and Hypertension    Caused BP to go up   Aricept [Donepezil]     Other reaction(s): diarrhea   Cream Base Other (See Comments)    SOUR CREAM ONLY   Latex     Other reaction(s): rash   Nardil [Phenelzine]     Other reaction(s): HTN   Pork-Derived Products    Hibiclens [Chlorhexidine Gluconate] Hives   Lipitor [Atorvastatin Calcium] Rash   Lisinopril Rash    Other reaction(s): rash   Penicillins Swelling and Other (See Comments)    Tolerated Cephalosporin Date: 05/01/22     Plavix [Clopidogrel Bisulfate] Rash   Simvastatin Rash    Other reaction(s): rash   Sulfa Antibiotics Rash   Sulfonamide Derivatives Rash   Tape Rash    Other reaction(s): rash    Level of Care/Admitting Diagnosis ED Disposition     ED Disposition  Admit   Condition  --   Comment  Hospital Area: [facility] [[id number]]  Level of Care: Progressive [102]  Admit to Progressive based on following criteria: GI, ENDOCRINE disease patients with GI bleeding, acute liver failure or pancreatitis, stable with diabetic ketoacidosis or thyrotoxicosis (hypothyroid) state.  May admit patient to [facility] or [facility] if equivalent level of care is available:: Yes  Covid Evaluation:  Asymptomatic - no recent exposure (last 10 days) testing not required  Diagnosis: DKA (diabetic ketoacidosis) [facility]) [[id number]]  Admitting Physician: Briscoe Deutscher [[id number]]  Attending Physician: Briscoe Deutscher [[id number]]  Certification:: I certify this patient will need inpatient services for at least 2 midnights  Estimated Length of Stay: 3          B Medical/Surgery History Past Medical History:  Diagnosis Date   Anxiety    Atrial fibrillation (HCC)    Intermittent   CAD (coronary artery disease)    non obstructive   CKD (chronic kidney disease)    Diabetes mellitus without complication (HCC)    Dysrhythmia    PAF   GERD (gastroesophageal reflux disease)    Glaucoma    Narrow Angle   Heart murmur    HTN (hypertension)    Hyperlipidemia    Hypothyroid    Major depression    10-17-2010: hospitalized for suicidal, Verneita Griffes, [place] (D/C 10-30-2010)   Migraine headache    OSA (obstructive sleep apnea)    CPAP nightly   Peripheral neuropathy    Presence of permanent cardiac pacemaker    Seasonal allergies    Sleep apnea    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    ABDOMINAL HYSTERECTOMY  1976  APPENDECTOMY     BREAST EXCISIONAL BIOPSY     BREAST SURGERY     fibroids removed, benign   CARDIAC CATHETERIZATION  02-12-2006   Minor irregularities: RCA-mid 40%, LM-normal, LAD-normal. EF 65%.   CHOLECYSTECTOMY     ECTOPIC PREGNANCY SURGERY     treatment x3, last one 11-02-2009 at [facility]   goiter     goiter removed     MASS EXCISION Right 10/18/2015   Procedure: EXCISION OF 6CM MASS ON RIGHT  NECK/BACK;  Surgeon: Luretha Murphy, MD;  Location: [facility];  Service: General;  Laterality: Right;   PACEMAKER GENERATOR CHANGE N/A 01/15/2012   Procedure:  PACEMAKER GENERATOR CHANGE;  Surgeon: Duke Salvia, MD;  Location: [facility] CATH LAB;  Service: Cardiovascular;  Laterality: N/A;   PACEMAKER INSERTION     Permanent. [employer] EnRhyth 04/2006, set as DDDR   REVERSE SHOULDER ARTHROPLASTY Right 05/01/2022   Procedure: REVERSE SHOULDER ARTHROPLASTY;  Surgeon: Bjorn Pippin, MD;  Location: [facility];  Service: Orthopedics;  Laterality: Right;   TUMOR REMOVAL  2000   Fibroid from breast x4-most recent   WRIST FRACTURE SURGERY  11-2007   right, had metal plate inserted     A IV Location/Drains/Wounds Patient Lines/Drains/Airways Status     Active Line/Drains/Airways     Name Placement date Placement time Site Days   Peripheral IV 02/21/23 20 G Right Antecubital 02/21/23  1902  Antecubital  1   Peripheral IV 02/21/23 20 G 1" Left Antecubital 02/21/23  2120  Antecubital  1   External Urinary Catheter 02/21/23  2200  --  1            Intake/Output Last 24 hours  Intake/Output Summary (Last 24 hours) at 02/22/2023 1146 Last data filed at 02/22/2023 0024 Gross per 24 hour  Intake 1356.89 ml  Output --  Net 1356.89 ml    Labs/Imaging Results for orders placed or performed during the hospital encounter of 02/21/23 (from the past 48 hour(s))  CBG monitoring, ED     Status: Abnormal   Collection Time: 02/21/23  6:52 PM  Result Value Ref Range   Glucose-Capillary >600 (HH) 70 - 99 mg/dL    Comment: Glucose reference range applies only to samples taken after fasting for at least 8 hours.  Basic metabolic panel     Status: Abnormal   Collection Time: 02/21/23  6:59 PM  Result Value Ref Range   Sodium 132 (L) 135 - 145 mmol/L   Potassium 5.0 3.5 - 5.1 mmol/L   Chloride 93 (L) 98 - 111 mmol/L   CO2 16 (L) 22 - 32 mmol/L   Glucose, Bld 737 (HH) 70 - 99 mg/dL    Comment: CRITICAL RESULT CALLED TO, READ BACK BY AND VERIFIED WITH Angela Adam RN AT [phone] BY D LONG Glucose reference range applies only to samples taken after fasting for at least  8 hours.    BUN 25 (H) 8 - 23 mg/dL   Creatinine, Ser 6.57 (H) 0.44 - 1.00 mg/dL   Calcium 84.6 (H) 8.9 - 10.3 mg/dL   GFR, Estimated 32 (L) >60 mL/min    Comment: (NOTE) Calculated using the CKD-EPI Creatinine Equation (2021)    Anion gap 23 (H) 5 - 15    Comment: ELECTROLYTES REPEATED TO VERIFY Performed at [facility], [address] [place], [place] [address]   CBC     Status: Abnormal   Collection Time: 02/21/23  6:59 PM  Result Value Ref Range   WBC 15.3 (H) 4.0 - 10.5 K/uL   RBC 4.68 3.87 - 5.11 MIL/uL   Hemoglobin 14.3 12.0 - 15.0 g/dL   HCT 02.7 25.3 - 66.4 %   MCV 88.2 80.0 - 100.0 fL   MCH 30.6 26.0 - 34.0 pg   MCHC 34.6 30.0 - 36.0 g/dL   RDW 40.3 47.4 - 25.9 %   Platelets 389 150 - 400 K/uL   nRBC 0.2 0.0 - 0.2 %    Comment: Performed at [facility], [address], [place], [place] [address]  Beta-hydroxybutyric acid     Status: Abnormal   Collection Time: 02/21/23  7:31 PM  Result Value Ref Range   Beta-Hydroxybutyric Acid 6.85 (H) 0.05 - 0.27 mmol/L    Comment: RESULT CONFIRMED BY MANUAL DILUTION Performed at [facility], [address], [place], [place] [address]   Osmolality     Status: Abnormal   Collection Time: 02/21/23  7:31 PM  Result Value Ref Range   Osmolality 354 (HH) 275 - 295 mOsm/kg    Comment: REPEATED TO VERIFY CRITICAL RESULT CALLED TO, READ BACK BY AND VERIFIED WITH: NOTIFIED Angela Adam, RN AT 2035 05.17.24 D. Leonidas Romberg Performed at [facility], [address], [place], [place] [address]   I-Stat venous blood gas, [facility] ED, [facility], [facility])     Status: Abnormal   Collection Time: 02/21/23  7:37 PM  Result Value Ref Range   pH, Ven 7.396 7.25 - 7.43   pCO2, Ven 25.7 (L) 44 - 60 mmHg   pO2, Ven 39 32 - 45 mmHg   Bicarbonate 15.8 (L) 20.0 - 28.0 mmol/L   TCO2 17 (L) 22 - 32 mmol/L   O2 Saturation 74 %   Acid-base deficit 7.0 (H) 0.0 - 2.0 mmol/L   Sodium 133 (L) 135 - 145 mmol/L   Potassium 5.0 3.5 - 5.1 mmol/L    Calcium, Ion 1.31 1.15 - 1.40 mmol/L   HCT 41.0 36.0 - 46.0 %   Hemoglobin 13.9 12.0 - 15.0 g/dL   Sample type VENOUS    Comment NOTIFIED PHYSICIAN   Urinalysis, Routine w reflex microscopic -Urine, Clean Catch     Status: Abnormal   Collection Time: 02/21/23  8:15 PM  Result Value Ref Range   Color, Urine YELLOW YELLOW   APPearance CLEAR CLEAR   Specific Gravity, Urine 1.030 1.005 - 1.030   pH 5.0 5.0 - 8.0   Glucose, UA >=500 (A) NEGATIVE mg/dL   Hgb urine dipstick NEGATIVE NEGATIVE   Bilirubin Urine NEGATIVE NEGATIVE   Ketones, ur 20 (A) NEGATIVE mg/dL   Protein, ur 30 (A) NEGATIVE mg/dL   Nitrite NEGATIVE NEGATIVE   Leukocytes,Ua NEGATIVE NEGATIVE   RBC / HPF 0-5 0 - 5 RBC/hpf   WBC, UA 0-5 0 - 5 WBC/hpf   Bacteria, UA NONE SEEN NONE SEEN   Squamous Epithelial / HPF 0-5 0 - 5 /HPF    Comment: Performed at [facility], [address] [place], [place] [address]  Resp panel by RT-PCR (RSV, Flu A&B, Covid) Anterior Nasal Swab     Status: None   Collection Time: 02/21/23  8:36 PM   Specimen: Anterior Nasal Swab  Result Value Ref Range   SARS Coronavirus 2 by RT PCR NEGATIVE NEGATIVE   Influenza A by PCR NEGATIVE NEGATIVE   Influenza B by PCR NEGATIVE NEGATIVE    Comment: (NOTE) The Xpert Xpress SARS-CoV-2/FLU/RSV  plus assay is intended as an aid in the diagnosis of influenza from Nasopharyngeal swab specimens and should not be used as a sole basis for treatment. Nasal washings and aspirates are unacceptable for Xpert Xpress SARS-CoV-2/FLU/RSV testing.  Fact Sheet for Patients: BloggerCourse.com  Fact Sheet for Healthcare Providers: SeriousBroker.it  This test is not yet approved or cleared by the [place] FDA and has been authorized for detection and/or diagnosis of SARS-CoV-2 by FDA under an Emergency Use Authorization (EUA). This EUA will remain in effect (meaning this test can be used) for the duration of  the COVID-19 declaration under Section 564(b)(1) of the Act, 21 U.S.C. section 360bbb-3(b)(1), unless the authorization is terminated or revoked.     Resp Syncytial Virus by PCR NEGATIVE NEGATIVE    Comment: (NOTE) Fact Sheet for Patients: BloggerCourse.com  Fact Sheet for Healthcare Providers: SeriousBroker.it  This test is not yet approved or cleared by the [place] FDA and has been authorized for detection and/or diagnosis of SARS-CoV-2 by FDA under an Emergency Use Authorization (EUA). This EUA will remain in effect (meaning this test can be used) for the duration of the COVID-19 declaration under Section 564(b)(1) of the Act, 21 U.S.C. section 360bbb-3(b)(1), unless the authorization is terminated or revoked.  Performed at [facility], [address] [place], [place] [address]   Basic metabolic panel     Status: Abnormal   Collection Time: 02/21/23  9:20 PM  Result Value Ref Range   Sodium 136 135 - 145 mmol/L   Potassium 4.7 3.5 - 5.1 mmol/L   Chloride 100 98 - 111 mmol/L   CO2 17 (L) 22 - 32 mmol/L   Glucose, Bld 557 (HH) 70 - 99 mg/dL    Comment: CRITICAL RESULT CALLED TO, READ BACK BY AND VERIFIED WITH MELINDA LAMBERT RN 02/21/23 2259 M KOROLESKI Glucose reference range applies only to samples taken after fasting for at least 8 hours.    BUN 22 8 - 23 mg/dL   Creatinine, Ser 6.04 (H) 0.44 - 1.00 mg/dL   Calcium 54.0 8.9 - 98.1 mg/dL   GFR, Estimated 39 (L) >60 mL/min    Comment: (NOTE) Calculated using the CKD-EPI Creatinine Equation (2021)    Anion gap 19 (H) 5 - 15    Comment: Performed at [facility], [address], [place], [place] [address]  CBG monitoring, ED     Status: Abnormal   Collection Time: 02/21/23  9:23 PM  Result Value Ref Range   Glucose-Capillary 537 (HH) 70 - 99 mg/dL    Comment: Glucose reference range applies only to samples taken after fasting for at least 8 hours.    Comment 1 Notify RN   CBG monitoring, ED     Status: Abnormal   Collection Time: 02/21/23 10:29 PM  Result Value Ref Range   Glucose-Capillary 467 (H) 70 - 99 mg/dL    Comment: Glucose reference range applies only to samples taken after fasting for at least 8 hours.   Comment 1 Notify RN   CBG monitoring, ED     Status: Abnormal   Collection Time: 02/21/23 11:13 PM  Result Value Ref Range   Glucose-Capillary 360 (H) 70 - 99 mg/dL    Comment: Glucose reference range applies only to samples taken after fasting for at least 8 hours.  CBG monitoring, ED     Status: Abnormal   Collection Time: 02/22/23 12:21 AM  Result Value Ref Range   Glucose-Capillary 207 (H)  70 - 99 mg/dL    Comment: Glucose reference range applies only to samples taken after fasting for at least 8 hours.  Basic metabolic panel     Status: Abnormal   Collection Time: 02/22/23 12:35 AM  Result Value Ref Range   Sodium 139 135 - 145 mmol/L   Potassium 3.8 3.5 - 5.1 mmol/L   Chloride 106 98 - 111 mmol/L   CO2 24 22 - 32 mmol/L   Glucose, Bld 231 (H) 70 - 99 mg/dL    Comment: Glucose reference range applies only to samples taken after fasting for at least 8 hours.   BUN 18 8 - 23 mg/dL   Creatinine, Ser 1.61 0.44 - 1.00 mg/dL   Calcium 9.7 8.9 - 09.6 mg/dL   GFR, Estimated 57 (L) >60 mL/min    Comment: (NOTE) Calculated using the CKD-EPI Creatinine Equation (2021)    Anion gap 9 5 - 15    Comment: Performed at [facility], [address], [place], [place] [address]  CBG monitoring, ED     Status: Abnormal   Collection Time: 02/22/23  1:24 AM  Result Value Ref Range   Glucose-Capillary 206 (H) 70 - 99 mg/dL    Comment: Glucose reference range applies only to samples taken after fasting for at least 8 hours.  CBG monitoring, ED     Status: Abnormal   Collection Time: 02/22/23  2:28 AM  Result Value Ref Range   Glucose-Capillary 233 (H) 70 - 99 mg/dL    Comment: Glucose reference range applies only to  samples taken after fasting for at least 8 hours.  CBG monitoring, ED     Status: Abnormal   Collection Time: 02/22/23  3:29 AM  Result Value Ref Range   Glucose-Capillary 177 (H) 70 - 99 mg/dL    Comment: Glucose reference range applies only to samples taken after fasting for at least 8 hours.  CBG monitoring, ED     Status: Abnormal   Collection Time: 02/22/23  4:28 AM  Result Value Ref Range   Glucose-Capillary 179 (H) 70 - 99 mg/dL    Comment: Glucose reference range applies only to samples taken after fasting for at least 8 hours.  Basic metabolic panel     Status: Abnormal   Collection Time: 02/22/23  4:32 AM  Result Value Ref Range   Sodium 138 135 - 145 mmol/L   Potassium 3.4 (L) 3.5 - 5.1 mmol/L   Chloride 105 98 - 111 mmol/L   CO2 24 22 - 32 mmol/L   Glucose, Bld 188 (H) 70 - 99 mg/dL    Comment: Glucose reference range applies only to samples taken after fasting for at least 8 hours.   BUN 16 8 - 23 mg/dL   Creatinine, Ser 9.81 0.44 - 1.00 mg/dL   Calcium 9.8 8.9 - 19.1 mg/dL   GFR, Estimated >47 >82 mL/min    Comment: (NOTE) Calculated using the CKD-EPI Creatinine Equation (2021)    Anion gap 9 5 - 15    Comment: Performed at [facility], [address], [place], [place] [address]  CBG monitoring, ED     Status: Abnormal   Collection Time: 02/22/23  5:34 AM  Result Value Ref Range   Glucose-Capillary 168 (H) 70 - 99 mg/dL    Comment: Glucose reference range applies only to samples taken after fasting for at least 8 hours.  CBG monitoring, ED     Status: Abnormal  Collection Time: 02/22/23  6:58 AM  Result Value Ref Range   Glucose-Capillary 164 (H) 70 - 99 mg/dL    Comment: Glucose reference range applies only to samples taken after fasting for at least 8 hours.  CBG monitoring, ED     Status: Abnormal   Collection Time: 02/22/23  7:41 AM  Result Value Ref Range   Glucose-Capillary 161 (H) 70 - 99 mg/dL    Comment: Glucose reference range applies only to  samples taken after fasting for at least 8 hours.  Basic metabolic panel     Status: Abnormal   Collection Time: 02/22/23  8:26 AM  Result Value Ref Range   Sodium 137 135 - 145 mmol/L   Potassium 3.7 3.5 - 5.1 mmol/L   Chloride 104 98 - 111 mmol/L   CO2 24 22 - 32 mmol/L   Glucose, Bld 186 (H) 70 - 99 mg/dL    Comment: Glucose reference range applies only to samples taken after fasting for at least 8 hours.   BUN 18 8 - 23 mg/dL   Creatinine, Ser 1.61 0.44 - 1.00 mg/dL   Calcium 9.7 8.9 - 09.6 mg/dL   GFR, Estimated >04 >54 mL/min    Comment: (NOTE) Calculated using the CKD-EPI Creatinine Equation (2021)    Anion gap 9 5 - 15    Comment: Performed at [facility], [address] [place], [place] [address]  CBG monitoring, ED     Status: Abnormal   Collection Time: 02/22/23  8:40 AM  Result Value Ref Range   Glucose-Capillary 185 (H) 70 - 99 mg/dL    Comment: Glucose reference range applies only to samples taken after fasting for at least 8 hours.  CBG monitoring, ED     Status: Abnormal   Collection Time: 02/22/23  9:41 AM  Result Value Ref Range   Glucose-Capillary 201 (H) 70 - 99 mg/dL    Comment: Glucose reference range applies only to samples taken after fasting for at least 8 hours.  CBG monitoring, ED     Status: Abnormal   Collection Time: 02/22/23 10:35 AM  Result Value Ref Range   Glucose-Capillary 174 (H) 70 - 99 mg/dL    Comment: Glucose reference range applies only to samples taken after fasting for at least 8 hours.   DG Chest Portable 1 View  Result Date: 02/21/2023 CLINICAL DATA:  Weakness EXAM: PORTABLE CHEST 1 VIEW COMPARISON:  03/27/2022 FINDINGS: Lungs are clear. No pneumothorax or pleural effusion. Cardiac size within normal limits. Pulmonary vascularity is normal. 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 No acute bone abnormality. IMPRESSION: No active disease. Electronically Signed   By: Helyn Numbers M.D.   On: 02/21/2023 20:51    Pending Labs Unresulted Labs (From admission, onward)     Start     Ordered   02/23/23 0500  Basic metabolic panel  Daily,   R      02/22/23 1000   02/23/23 0500  Renal function panel  Tomorrow morning,   R        02/22/23 1000   02/23/23 0500  Magnesium  Tomorrow morning,   R        02/22/23 1000   02/23/23 0500  CBC with Differential/Platelet  Tomorrow morning,   R        02/22/23 1000   02/21/23 2035  Basic metabolic panel  (Diabetes Ketoacidosis (DKA))  STAT Now then every 4 hours ,  R (with STAT occurrences)      02/21/23 2034            Vitals/Pain Today's Vitals   02/22/23 0930 02/22/23 0945 02/22/23 1000 02/22/23 1015  BP: (!) 111/50 (!) 143/73 (!) 144/71 (!) 142/66  Pulse: 96 95 94 91  Resp: 18 18 19 16   Temp:      TempSrc:      SpO2: 100% 100% 100% 99%  Weight:      Height:      PainSc:        Isolation Precautions No active isolations  Medications Medications  dextrose 5 % in lactated ringers infusion (0 mLs Intravenous Stopped 02/22/23 1020)  lactated ringers infusion (0 mLs Intravenous Stopped 02/22/23 0024)  dextrose 50 % solution 0-50 mL (has no administration in time range)  aspirin chewable tablet 81 mg (81 mg Oral Given 02/22/23 1029)  galantamine (RAZADYNE ER) 24 hr capsule 8 mg (8 mg Oral Given 02/22/23 1029)  hydrOXYzine (ATARAX) tablet 25 mg (25 mg Oral Patient Refused/Not Given 02/22/23 0030)  nortriptyline (PAMELOR) capsule 100 mg (100 mg Oral Given 02/22/23 0205)  levothyroxine (SYNTHROID) tablet 75 mcg (75 mcg Oral Given 02/22/23 0816)  predniSONE (DELTASONE) tablet 20 mg (20 mg Oral Given 02/22/23 0817)  pantoprazole (PROTONIX) EC tablet 40 mg (40 mg Oral Given 02/22/23 1029)  melatonin tablet 3 mg (3 mg Oral Patient Refused/Not Given 02/22/23 0030)  gabapentin (NEURONTIN) capsule 300 mg (300 mg Oral Given 02/22/23 1029)  dextrose 5 % and 0.9 % NaCl 1,000 mL with potassium chloride 20 mEq infusion (  Intravenous New Bag/Given 02/22/23 1025)  insulin glargine-yfgn (SEMGLEE) injection 10 Units (has no administration in time range)  insulin aspart (novoLOG) injection 0-15 Units (3 Units Subcutaneous Given 02/22/23 1040)  sodium chloride 0.9 % bolus 1,000 mL ( Intravenous Stopped 02/21/23 2056)  lactated ringers bolus 1,500 mL ( Intravenous Stopped 02/21/23 2241)  potassium chloride 10 mEq in 100 mL IVPB (0 mEq Intravenous Stopped 02/21/23 2324)  insulin glargine-yfgn (SEMGLEE) injection 20 Units (20 Units Subcutaneous Given 02/22/23 0839)    Mobility non-ambulatory     Focused Assessments    R Recommendations: See Admitting Provider Note  Report given to:   Additional Notes:

## 2023-02-22 NOTE — Progress Notes (Signed)
Pt states she doesn't wear CPAP at home, doesn't want to wear CPAP while at the hospital.

## 2023-02-23 DIAGNOSIS — E1110 Type 2 diabetes mellitus with ketoacidosis without coma: Secondary | ICD-10-CM | POA: Diagnosis not present

## 2023-02-23 LAB — RENAL FUNCTION PANEL
Albumin: 2.5 g/dL — ABNORMAL LOW (ref 3.5–5.0)
Anion gap: 9 (ref 5–15)
BUN: 11 mg/dL (ref 8–23)
CO2: 24 mmol/L (ref 22–32)
Calcium: 9.1 mg/dL (ref 8.9–10.3)
Chloride: 104 mmol/L (ref 98–111)
Creatinine, Ser: 0.75 mg/dL (ref 0.44–1.00)
GFR, Estimated: >60 mL/min (ref >60)
Glucose, Bld: 96 mg/dL (ref 70–99)
Phosphorus: 1.7 mg/dL — ABNORMAL LOW (ref 2.5–4.6)
Potassium: 3.2 mmol/L — ABNORMAL LOW (ref 3.5–5.1)
Sodium: 137 mmol/L (ref 135–145)

## 2023-02-23 LAB — CBC WITH DIFFERENTIAL/PLATELET
Abs Immature Granulocytes: 0.14 10*3/uL — ABNORMAL HIGH (ref 0.00–0.07)
Basophils Absolute: 0.1 10*3/uL (ref 0.0–0.1)
Basophils Relative: 0 %
Eosinophils Absolute: 0.3 10*3/uL (ref 0.0–0.5)
Eosinophils Relative: 2 %
HCT: 33.6 % — ABNORMAL LOW (ref 36.0–46.0)
Hemoglobin: 11.8 g/dL — ABNORMAL LOW (ref 12.0–15.0)
Immature Granulocytes: 1 %
Lymphocytes Relative: 42 %
Lymphs Abs: 4.8 10*3/uL — ABNORMAL HIGH (ref 0.7–4.0)
MCH: 32.1 pg (ref 26.0–34.0)
MCHC: 35.1 g/dL (ref 30.0–36.0)
MCV: 91.3 fL (ref 80.0–100.0)
Monocytes Absolute: 0.5 10*3/uL (ref 0.1–1.0)
Monocytes Relative: 4 %
Neutro Abs: 5.8 10*3/uL (ref 1.7–7.7)
Neutrophils Relative %: 51 %
Platelets: 189 10*3/uL (ref 150–400)
RBC: 3.68 MIL/uL — ABNORMAL LOW (ref 3.87–5.11)
RDW: 15.1 % (ref 11.5–15.5)
WBC: 11.5 10*3/uL — ABNORMAL HIGH (ref 4.0–10.5)
nRBC: 0.4 % — ABNORMAL HIGH (ref 0.0–0.2)

## 2023-02-23 LAB — GLUCOSE, CAPILLARY
Glucose-Capillary: 112 mg/dL — ABNORMAL HIGH (ref 70–99)
Glucose-Capillary: 136 mg/dL — ABNORMAL HIGH (ref 70–99)
Glucose-Capillary: 323 mg/dL — ABNORMAL HIGH (ref 70–99)
Glucose-Capillary: 332 mg/dL — ABNORMAL HIGH (ref 70–99)
Glucose-Capillary: 304 mg/dL — ABNORMAL HIGH (ref 70–99)
Glucose-Capillary: 199 mg/dL — ABNORMAL HIGH (ref 70–99)

## 2023-02-23 LAB — MAGNESIUM: Magnesium: 1.7 mg/dL (ref 1.7–2.4)

## 2023-02-23 MED ORDER — SODIUM PHOSPHATES 45 MMOLE/15ML IV SOLN: 30.0000 mmol | Freq: Once | INTRAVENOUS | Status: DC

## 2023-02-23 MED ORDER — POTASSIUM CHLORIDE CRYS ER 20 MEQ PO TBCR
40.0000 meq | EXTENDED_RELEASE_TABLET | Freq: Once | ORAL | Status: AC
  Administered 2023-02-23: 40 meq via ORAL
  Filled 2023-02-23: qty 2

## 2023-02-23 MED ORDER — POTASSIUM PHOSPHATES 15 MMOLE/5ML IV SOLN
30.0000 mmol | Freq: Once | INTRAVENOUS | Status: AC
  Administered 2023-02-23: 30 mmol via INTRAVENOUS
  Filled 2023-02-23: qty 10

## 2023-02-23 MED ORDER — MELATONIN 3 MG PO TABS: 3.0000 mg | ORAL_TABLET | Freq: Every day | ORAL | Status: DC

## 2023-02-23 MED ORDER — PYRIDOSTIGMINE BROMIDE 60 MG PO TABS
30.0000 mg | ORAL_TABLET | Freq: Two times a day (BID) | ORAL | Status: DC
  Administered 2023-02-23 – 2023-02-27 (×8): 30 mg via ORAL
  Filled 2023-02-23 (×9): qty 0.5

## 2023-02-23 MED ORDER — K PHOS MONO-SOD PHOS DI & MONO 155-852-130 MG PO TABS
500.0000 mg | ORAL_TABLET | Freq: Two times a day (BID) | ORAL | Status: AC
  Administered 2023-02-23 (×2): 500 mg via ORAL
  Filled 2023-02-23 (×2): qty 2

## 2023-02-23 MED ORDER — METOPROLOL SUCCINATE ER 100 MG PO TB24
100.0000 mg | ORAL_TABLET | Freq: Every day | ORAL | Status: DC
  Administered 2023-02-23: 100 mg via ORAL
  Filled 2023-02-23: qty 1

## 2023-02-23 MED ORDER — SPIRONOLACTONE 25 MG PO TABS
25.0000 mg | ORAL_TABLET | Freq: Every day | ORAL | Status: DC
  Administered 2023-02-23: 25 mg via ORAL
  Filled 2023-02-23: qty 1

## 2023-02-23 MED ORDER — PREDNISONE 20 MG PO TABS
20.0000 mg | ORAL_TABLET | Freq: Every day | ORAL | Status: DC
  Administered 2023-02-23: 20 mg via ORAL
  Filled 2023-02-23: qty 1

## 2023-02-23 NOTE — Inpatient Diabetes Management (Addendum)
Inpatient Diabetes Program Recommendations  AACE/ADA: New Consensus Statement on Inpatient Glycemic Control (2015)  Target Ranges:  Prepandial:   less than 140 mg/dL      Peak postprandial:   less than 180 mg/dL (1-2 hours)      Critically ill patients:  140 - 180 mg/dL   Lab Results  Component Value Date   GLUCAP 136 (H) 02/23/2023   HGBA1C 7.4 (H) 11/15/2022    Review of Glycemic Control  Diabetes history: type 2 Outpatient Diabetes medications: Metformin 500 mg BID Current orders for Inpatient glycemic control: Semglee 20 units at HS, Novolog 0-15 units correction scale every 4 hours.  Inpatient Diabetes Program Recommendations:   Spoke to patient on the phone. Diabetes coordinator is working remotely for weekend call.  Patient states that she was diagnosed with diabetes about 2 years ago. Has been on Metformin since then. Patient lives at [facility] facility. States that her blood sugars are not tested there.   Has been eating sweet foods, soft foods,and drinking sweet tea. States that she needs soft foods that are easy to swallow. Needs to find foods that are lower in sugar.  She states that she is allergic to pork, swiss cheese, and sour cream.   Will probably need to be discharged on insulin. Patient states that her daughter is a Engineer, civil (consulting) and can help get things organized for her care.   Will continue to monitor blood sugars while in the hospital.  Smith Mince RN BSN CDE Diabetes Coordinator Pager: [phone]  8am-5pm

## 2023-02-23 NOTE — Progress Notes (Signed)
Pt doesn't want to wear CPAP for the night.  

## 2023-02-23 NOTE — Progress Notes (Signed)
MEWS Progress Note  Patient Details Name: Heidi [name] MRN: [id number] DOB: 02-Sep-1941 Today's Date: 02/23/2023   MEWS Flowsheet Documentation:  Assess: MEWS Score Temp: 98.5 F (36.9 C) BP: 127/72 MAP (mmHg): 87 Pulse Rate: (!) 105 ECG Heart Rate: (!) 105 Resp: 14 Level of Consciousness: Alert SpO2: 97 % O2 Device: Room Air Patient Activity (if Appropriate): In chair Assess: MEWS Score MEWS Temp: 0 MEWS Systolic: 0 MEWS Pulse: 1 MEWS RR: 0 MEWS LOC: 0 MEWS Score: 1 MEWS Score Color: Green Assess: SIRS CRITERIA SIRS Temperature : 0 SIRS Respirations : 0 SIRS Pulse: 1 SIRS WBC: 0 SIRS Score Sum : 1 SIRS Temperature : 0 SIRS Pulse: 1 SIRS Respirations : 0 SIRS WBC: 0 SIRS Score Sum : 1 Assess: if the MEWS score is Yellow or Red Were vital signs taken at a resting state?: Yes Focused Assessment: No change from prior assessment Does the patient meet 2 or more of the SIRS criteria?: No Does the patient have a confirmed or suspected source of infection?: No MEWS guidelines implemented : Yes, yellow Treat MEWS Interventions: Considered administering scheduled or prn medications/treatments as ordered Take Vital Signs Increase Vital Sign Frequency : Yellow: Q2hr x1, continue Q4hrs until patient remains green for 12hrs Escalate MEWS: Escalate: Yellow: Discuss with charge nurse and consider notifying provider and/or RRT Notify: Charge Nurse/RN Name of Charge Nurse/RN Notified: desiray Provider Notification Date Provider Notified: 02/21/23 Time Provider Notified: 2036 Test performed and critical result: serum osmol 354 Date Critical Result Received: 02/21/23 Time Critical Result Received: 2035 Provider response: In department, See new orders      Georgia Duff 02/23/2023, 6:37 PM

## 2023-02-23 NOTE — Progress Notes (Signed)
PROGRESS NOTE    Heidi [name]  NUU:[id number] DOB: 1940-12-06 DOA: 02/21/2023 PCP: Uvaldo Bristle, NP  Outpatient Specialists:     Brief Narrative:  Patient is an 82 year old female with past medical history significant for type 2 diabetes mellitus, coronary artery disease, atrial fibrillation but not anticoagulated, myasthenia gravis, hypertension, depression, and OSA on CPAP.  Patient was admitted with DKA.  Apparently, patient has been on metformin, but patient stopped checking blood sugar about a year ago.  On presentation to the hospital, blood sugar was 737 with anion gap of 23. She was admitted for further workup, kept on insulin gtt., she has been transitioned to insulin once her anion gap has closed.    Assessment & Plan:   Principal Problem:   DKA (diabetic ketoacidosis) (HCC) Active Problems:   Myasthenia gravis (HCC)   A-fib (HCC)   HTN (hypertension)   Hypothyroidism   CAD (coronary artery disease)   Chronic diastolic CHF (congestive heart failure) (HCC)   AKI (acute kidney injury) (HCC)   Diabetes mellitus, type II, non-insulin-dependent with DKA  -History of diabetes mellitus, not requiring insulin, presents with DKA, most likely due to steroids and diet indiscretion -Anion gap has closed, transition to Semglee and insulin sliding scale, discussed with staff to educate her about how to inject insulin as likely she will need at time of discharge   AKI   - SCr is 1.61 on admission, up from 0.76 in February  - Hold diuretics, continue IVF hydration, repeat chem panel 02/22/2023: AKI has resolved.   CAD  - No angina  - Continue ASA and statin     Depression, anxiety, dementia  - Continue galantamine, nortriptyline, and hydroxyzine, use delirium precautions     Chronic diastolic CHF  -Compensated.   Myasthenia gravis  - Not in crisis on admission  - Continue Mestinon and prednisone     Hypothyroidism  - Continue Synthroid     OSA  - CPAP qHS    A fib   - She had a single brief episode during a hospitalization and is not anticoagulated   - In SR on admission    Hypokalemia -Repleted, recheck in AM.   DVT prophylaxis: SCD. Code Status: Full code. Family Communication: None at bedside Disposition Plan: She is significantly weak, will request PT evaluation   Consultants:  None.  Procedures:  None.  Antimicrobials:  None.   Subjective: No nausea, no vomiting, she reports significant weakness Objective: Vitals:   02/22/23 1941 02/22/23 2344 02/23/23 0400 02/23/23 0750  BP: 133/71 116/64 102/63 137/77  Pulse: 100 94 87 98  Resp: 19 15 14 14   Temp: 98.4 F (36.9 C) 98 F (36.7 C) 98.4 F (36.9 C) (!) 97.5 F (36.4 C)  TempSrc: Oral Oral Oral Oral  SpO2: 98% 97% 94% 96%  Weight:      Height:        Intake/Output Summary (Last 24 hours) at 02/23/2023 1144 Last data filed at 02/23/2023 3474 Gross per 24 hour  Intake 150 ml  Output 200 ml  Net -50 ml   Filed Weights   02/21/23 2125  Weight: 66.2 kg    Examination:  Awake Alert, Oriented X 3, No new F.N deficits, Normal affect Symmetrical Chest wall movement, Good air movement bilaterally, CTAB RRR,No Gallops,Rubs or new Murmurs, No Parasternal Heave +ve B.Sounds, Abd Soft, No tenderness, No rebound - guarding or rigidity. No Cyanosis, Clubbing or edema, No new Rash or bruise    Data Reviewed:  I have personally reviewed following labs and imaging studies  CBC: Recent Labs  Lab 02/21/23 1859 02/21/23 1937 02/23/23 0346  WBC 15.3*  --  11.5*  NEUTROABS  --   --  5.8  HGB 14.3 13.9 11.8*  HCT 41.3 41.0 33.6*  MCV 88.2  --  91.3  PLT 389  --  189   Basic Metabolic Panel: Recent Labs  Lab 02/22/23 0035 02/22/23 0432 02/22/23 0826 02/22/23 1405 02/23/23 0348  NA 139 138 137 135 137  K 3.8 3.4* 3.7 4.3 3.2*  CL 106 105 104 100 104  CO2 24 24 24 24 24   GLUCOSE 231* 188* 186* 342* 96  BUN 18 16 18 15 11   CREATININE 0.99 0.92 0.88 0.82 0.75   CALCIUM 9.7 9.8 9.7 9.4 9.1  MG  --   --   --   --  1.7  PHOS  --   --   --   --  1.7*   GFR: Estimated Creatinine Clearance: 48.1 mL/min (by C-G formula based on SCr of 0.75 mg/dL). Liver Function Tests: Recent Labs  Lab 02/23/23 0348  ALBUMIN 2.5*   No results for input(s): "LIPASE", "AMYLASE" in the last 168 hours. No results for input(s): "AMMONIA" in the last 168 hours. Coagulation Profile: No results for input(s): "INR", "PROTIME" in the last 168 hours. Cardiac Enzymes: No results for input(s): "CKTOTAL", "CKMB", "CKMBINDEX", "TROPONINI" in the last 168 hours. BNP (last 3 results) No results for input(s): "PROBNP" in the last 8760 hours. HbA1C: No results for input(s): "HGBA1C" in the last 72 hours. CBG: Recent Labs  Lab 02/22/23 1636 02/22/23 2016 02/22/23 2348 02/23/23 0421 02/23/23 0749  GLUCAP 412* 206* 108* 112* 136*   Lipid Profile: No results for input(s): "CHOL", "HDL", "LDLCALC", "TRIG", "CHOLHDL", "LDLDIRECT" in the last 72 hours. Thyroid Function Tests: No results for input(s): "TSH", "T4TOTAL", "FREET4", "T3FREE", "THYROIDAB" in the last 72 hours. Anemia Panel: No results for input(s): "VITAMINB12", "FOLATE", "FERRITIN", "TIBC", "IRON", "RETICCTPCT" in the last 72 hours. Urine analysis:    Component Value Date/Time   COLORURINE YELLOW 02/21/2023 2015   APPEARANCEUR CLEAR 02/21/2023 2015   LABSPEC 1.030 02/21/2023 2015   PHURINE 5.0 02/21/2023 2015   GLUCOSEU >=500 (A) 02/21/2023 2015   HGBUR NEGATIVE 02/21/2023 2015   BILIRUBINUR NEGATIVE 02/21/2023 2015   KETONESUR 20 (A) 02/21/2023 2015   PROTEINUR 30 (A) 02/21/2023 2015   UROBILINOGEN 1.0 10/17/2010 1558   NITRITE NEGATIVE 02/21/2023 2015   LEUKOCYTESUR NEGATIVE 02/21/2023 2015   Sepsis Labs: @LABRCNTIP (procalcitonin:4,lacticidven:4)  ) Recent Results (from the past 240 hour(s))  Resp panel by RT-PCR (RSV, Flu A&B, Covid) Anterior Nasal Swab     Status: None   Collection Time:  02/21/23  8:36 PM   Specimen: Anterior Nasal Swab  Result Value Ref Range Status   SARS Coronavirus 2 by RT PCR NEGATIVE NEGATIVE Final   Influenza A by PCR NEGATIVE NEGATIVE Final   Influenza B by PCR NEGATIVE NEGATIVE Final    Comment: (NOTE) The Xpert Xpress SARS-CoV-2/FLU/RSV plus assay is intended as an aid in the diagnosis of influenza from Nasopharyngeal swab specimens and should not be used as a sole basis for treatment. Nasal washings and aspirates are unacceptable for Xpert Xpress SARS-CoV-2/FLU/RSV testing.  Fact Sheet for Patients: BloggerCourse.com  Fact Sheet for Healthcare Providers: SeriousBroker.it  This test is not yet approved or cleared by the [place] FDA and has been authorized for detection and/or diagnosis of SARS-CoV-2 by FDA under an  Emergency Use Authorization (EUA). This EUA will remain in effect (meaning this test can be used) for the duration of the COVID-19 declaration under Section 564(b)(1) of the Act, 21 U.S.C. section 360bbb-3(b)(1), unless the authorization is terminated or revoked.     Resp Syncytial Virus by PCR NEGATIVE NEGATIVE Final    Comment: (NOTE) Fact Sheet for Patients: BloggerCourse.com  Fact Sheet for Healthcare Providers: SeriousBroker.it  This test is not yet approved or cleared by the [place] FDA and has been authorized for detection and/or diagnosis of SARS-CoV-2 by FDA under an Emergency Use Authorization (EUA). This EUA will remain in effect (meaning this test can be used) for the duration of the COVID-19 declaration under Section 564(b)(1) of the Act, 21 U.S.C. section 360bbb-3(b)(1), unless the authorization is terminated or revoked.  Performed at [facility], [address], [place], [place] [address]          Radiology Studies: DG Chest Portable 1 View  Result Date: 02/21/2023 CLINICAL  DATA:  Weakness EXAM: PORTABLE CHEST 1 VIEW COMPARISON:  03/27/2022 FINDINGS: Lungs are clear. No pneumothorax or pleural effusion. Cardiac size within normal limits. Pulmonary vascularity is normal. 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 No acute bone abnormality. IMPRESSION: No active disease. Electronically Signed   By: Helyn Numbers M.D.   On: 02/21/2023 20:51        Scheduled Meds:  aspirin  81 mg Oral Daily   gabapentin  300 mg Oral TID   galantamine  8 mg Oral Daily   hydrOXYzine  25 mg Oral QHS   insulin aspart  0-15 Units Subcutaneous Q4H   insulin glargine-yfgn  20 Units Subcutaneous QHS   levothyroxine  75 mcg Oral Q0600   melatonin  3 mg Oral QHS   metFORMIN  1,000 mg Oral BID WC   nortriptyline  100 mg Oral QHS   pantoprazole  40 mg Oral BID   phosphorus  500 mg Oral BID   predniSONE  20 mg Oral Q breakfast   Continuous Infusions:  lactated ringers Stopped (02/22/23 0024)   potassium PHOSPHATE IVPB (in mmol) 30 mmol (02/23/23 0847)     LOS: 2 days        Huey Bienenstock , MD  [facility] 7PM-7AM contact night coverage as above

## 2023-02-24 DIAGNOSIS — E1110 Type 2 diabetes mellitus with ketoacidosis without coma: Secondary | ICD-10-CM | POA: Diagnosis not present

## 2023-02-24 DIAGNOSIS — G7000 Myasthenia gravis without (acute) exacerbation: Secondary | ICD-10-CM | POA: Diagnosis not present

## 2023-02-24 LAB — BASIC METABOLIC PANEL
Anion gap: 8 (ref 5–15)
BUN: 9 mg/dL (ref 8–23)
CO2: 24 mmol/L (ref 22–32)
Calcium: 8.9 mg/dL (ref 8.9–10.3)
Chloride: 100 mmol/L (ref 98–111)
Creatinine, Ser: 0.6 mg/dL (ref 0.44–1.00)
GFR, Estimated: >60 mL/min (ref >60)
Glucose, Bld: 134 mg/dL — ABNORMAL HIGH (ref 70–99)
Potassium: 3 mmol/L — ABNORMAL LOW (ref 3.5–5.1)
Sodium: 132 mmol/L — ABNORMAL LOW (ref 135–145)

## 2023-02-24 LAB — GLUCOSE, CAPILLARY
Glucose-Capillary: 117 mg/dL — ABNORMAL HIGH (ref 70–99)
Glucose-Capillary: 152 mg/dL — ABNORMAL HIGH (ref 70–99)
Glucose-Capillary: 209 mg/dL — ABNORMAL HIGH (ref 70–99)
Glucose-Capillary: 212 mg/dL — ABNORMAL HIGH (ref 70–99)
Glucose-Capillary: 221 mg/dL — ABNORMAL HIGH (ref 70–99)
Glucose-Capillary: 240 mg/dL — ABNORMAL HIGH (ref 70–99)
Glucose-Capillary: 330 mg/dL — ABNORMAL HIGH (ref 70–99)

## 2023-02-24 LAB — MAGNESIUM: Magnesium: 1.4 mg/dL — ABNORMAL LOW (ref 1.7–2.4)

## 2023-02-24 LAB — PHOSPHORUS: Phosphorus: 3.2 mg/dL (ref 2.5–4.6)

## 2023-02-24 MED ORDER — HYDROCORTISONE ACETATE 25 MG RE SUPP
25.0000 mg | Freq: Two times a day (BID) | RECTAL | Status: DC
  Administered 2023-02-24 – 2023-02-27 (×6): 25 mg via RECTAL
  Filled 2023-02-24 (×8): qty 1

## 2023-02-24 MED ORDER — POTASSIUM CHLORIDE CRYS ER 20 MEQ PO TBCR
20.0000 meq | EXTENDED_RELEASE_TABLET | Freq: Four times a day (QID) | ORAL | Status: AC
  Administered 2023-02-24 (×2): 20 meq via ORAL
  Filled 2023-02-24 (×2): qty 1

## 2023-02-24 MED ORDER — IMMUNE GLOBULIN (HUMAN) 10 GM/100ML IV SOLN
1.0000 g/kg | Freq: Once | INTRAVENOUS | Status: AC
  Administered 2023-02-24: 55 g via INTRAVENOUS
  Filled 2023-02-24: qty 550

## 2023-02-24 MED ORDER — INSULIN GLARGINE-YFGN 100 UNIT/ML ~~LOC~~ SOLN
24.0000 [IU] | Freq: Every day | SUBCUTANEOUS | Status: DC
  Administered 2023-02-24 – 2023-02-25 (×2): 24 [IU] via SUBCUTANEOUS
  Filled 2023-02-24 (×3): qty 0.24

## 2023-02-24 MED ORDER — ENOXAPARIN SODIUM 40 MG/0.4ML IJ SOSY
40.0000 mg | PREFILLED_SYRINGE | INTRAMUSCULAR | Status: DC
  Administered 2023-02-24 – 2023-02-27 (×3): 40 mg via SUBCUTANEOUS
  Filled 2023-02-24 (×3): qty 0.4

## 2023-02-24 NOTE — Evaluation (Signed)
Occupational Therapy Evaluation Patient Details Name: Heidi GIANGRANDE MRN: [id number] DOB: 12/28/1940 Today's Date: 02/24/2023   History of Present Illness Patient is an 82 year old female who came to ED with progressive weakness and blood sugar of 737. Pt admitted with DKA. PMH: type 2 diabetes mellitus, coronary artery disease, atrial fibrillation but not anticoagulated, myasthenia gravis, hypertension, depression, and OSA on CPAP.   Clinical Impression   Pt admitted with the above diagnosis. Pt currently with functional limitations due to the deficits listed below (see OT Problem List). Prior to admit, pt was living in [facility] for BADL tasks and functional transfers. Typically using Rolator although recently required use of wheelchair due to increased fatigue/weakness and falls. Pt will benefit from acute skilled OT to increase their safety and independence with ADL and functional mobility for ADL to facilitate discharge. Patient will benefit from continued inpatient follow up therapy, <3 hours/day. OT will continue to follow patient acutely.         Recommendations for follow up therapy are one component of a multi-disciplinary discharge planning process, led by the attending physician.  Recommendations may be updated based on patient status, additional functional criteria and insurance authorization.   Assistance Recommended at Discharge Frequent or constant Supervision/Assistance  Patient can return home with the following A lot of help with walking and/or transfers;A lot of help with bathing/dressing/bathroom;Help with stairs or ramp for entrance    Functional Status Assessment  Patient has had a recent decline in their functional status and demonstrates the ability to make significant improvements in function in a reasonable and predictable amount of time.  Equipment Recommendations  None recommended by OT       Precautions / Restrictions Precautions Precautions: Fall Precaution  Comments: incontinence/urgency Restrictions Weight Bearing Restrictions: No      Mobility Bed Mobility Overal bed mobility:  (Pt up in recliner upon therapy arrival)    Patient Response: Anxious, Cooperative  Transfers Overall transfer level: Needs assistance Equipment used: Rolling walker (2 wheels) Transfers: Bed to chair/wheelchair/BSC, Sit to/from Stand Sit to Stand: Min guard     Step pivot transfers: Min guard     General transfer comment: Increased time to power up. VC for hand placement with RW management. Pt presents with labored breathing while performing transfer stating "I'm too tired." With encouragement and continued VC, pt was able to complete transfer safely.      Balance Overall balance assessment: Needs assistance Sitting-balance support: Feet supported, No upper extremity supported Sitting balance-Leahy Scale: Good Sitting balance - Comments: sitting in recliner during sock management   Standing balance support: Bilateral upper extremity supported, During functional activity, Reliant on assistive device for balance Standing balance-Leahy Scale: Poor Standing balance comment: reliant on external support        ADL either performed or assessed with clinical judgement   ADL Overall ADL's : Needs assistance/impaired Eating/Feeding: Modified independent;Sitting   Grooming: Wash/dry hands;Wash/dry face;Oral care;Applying deodorant;Brushing hair;Set up;Sitting   Upper Body Bathing: Set up;Sitting   Lower Body Bathing: Total assistance;Sitting/lateral leans;Sit to/from stand Lower Body Bathing Details (indicate cue type and reason): due to incontinence Upper Body Dressing : Set up;Sitting   Lower Body Dressing: Moderate assistance;Sit to/from stand   Toilet Transfer: Min guard;Rolling walker (2 wheels);BSC/3in1;Stand-pivot Statistician Details (indicate cue type and reason): VC for safety and sequencing Toileting- Clothing Manipulation and Hygiene:  Total assistance;Sit to/from stand               Vision Baseline Vision/History:  1 Wears glasses Ability to See in Adequate Light: 0 Adequate Patient Visual Report: No change from baseline Vision Assessment?: No apparent visual deficits            Pertinent Vitals/Pain Pain Assessment Pain Assessment: No/denies pain     Hand Dominance Right   Extremity/Trunk Assessment Upper Extremity Assessment Upper Extremity Assessment: Generalized weakness   Lower Extremity Assessment Lower Extremity Assessment: Generalized weakness   Cervical / Trunk Assessment Cervical / Trunk Assessment: Normal   Communication Communication Communication: No difficulties   Cognition Arousal/Alertness: Awake/alert Behavior During Therapy: WFL for tasks assessed/performed Overall Cognitive Status: Within Functional Limits for tasks assessed       General Comments  VSS on RA. Nursing in room frequently performing BP checks and temperature checks every 15 minutes            Home Living Family/patient expects to be discharged to:: Assisted living    Home Equipment: Grab bars - tub/shower;Grab bars - toilet;Rollator (4 wheels);Wheelchair - manual;Shower seat;Hand held shower head   Additional Comments: pt resides in [facility]. Pt has handicapped accessible bathroom with a walk-in shower      Prior Functioning/Environment Prior Level of Function : Needs assist       Physical Assist : Mobility (physical)     Mobility Comments: Normally pt is able to use her Rollator for functional mobility although the last few weeks she has resorted to use her manual w/c and has been pushed to/from dining room. ADLs Comments: Pt reports that she is Mod I with bathing, dressing, grooming, toileting and functional transfers.        OT Problem List: Decreased strength;Cardiopulmonary status limiting activity;Decreased activity tolerance;Impaired balance (sitting and/or standing);Decreased knowledge  of use of DME or AE      OT Treatment/Interventions: Self-care/ADL training;Therapeutic exercise;Therapeutic activities;Neuromuscular education;Energy conservation;Patient/family education;DME and/or AE instruction;Manual therapy;Balance training;Modalities    OT Goals(Current goals can be found in the care plan section) Acute Rehab OT Goals Patient Stated Goal: to get stronger OT Goal Formulation: Patient unable to participate in goal setting Time For Goal Achievement: 03/10/23 Potential to Achieve Goals: Good  OT Frequency: Min 2X/week       AM-PAC OT "6 Clicks" Daily Activity     Outcome Measure Help from another person eating meals?: None Help from another person taking care of personal grooming?: A Little Help from another person toileting, which includes using toliet, bedpan, or urinal?: Total Help from another person bathing (including washing, rinsing, drying)?: Total Help from another person to put on and taking off regular upper body clothing?: A Little Help from another person to put on and taking off regular lower body clothing?: Total 6 Click Score: 13   End of Session Equipment Utilized During Treatment: Rolling walker (2 wheels)  Activity Tolerance: Patient tolerated treatment well;Patient limited by fatigue Patient left: in chair;with call bell/phone within reach;with chair alarm set  OT Visit Diagnosis: Unsteadiness on feet (R26.81);Muscle weakness (generalized) (M62.81)                Time: 1610-9604 OT Time Calculation (min): 44 min Charges:  OT General Charges $OT Visit: 1 Visit OT Evaluation $OT Eval High Complexity: 1 High OT Treatments $Self Care/Home Management : 23-37 mins  Limmie Patricia, OTR/L,CBIS  Supplemental OT - [facility] and [facility] Secure Chat Preferred    Evelio Rueda, Charisse March 02/24/2023, 2:30 PM

## 2023-02-24 NOTE — Consult Note (Addendum)
Neurology Consultation  Reason for Consult: Possible MG flare Referring Physician: Dr. Randol Kern  CC: Bilateral lower extremity weakness, dysphagia and diplopia  History is obtained from: Patient, family and chart  HPI: Heidi [name] is a 82 y.o. female with history of diabetes, CAD, atrial fibrillation not on anticoagulation, myasthenia gravis, hypertension, depression and sleep apnea who initially presented in DKA with generalized weakness.  Patient has been having progressive dysphagia issues with her myasthenia gravis which make it difficult for her to eat anything other than ice cream and soft fruit.  Unfortunately, this caused her blood sugar to become very high and she went into DKA.  She has been treated for this but states that she is having increased weakness of the lower extremities as well as diplopia with sustained upgaze and lateral eye movement.  She has been receiving IVIG as an outpatient 1 g/kg and got her last dose last Wednesday.  She did miss a dose of Mestinon yesterday morning.   ROS: A complete ROS was performed and is negative except as noted in the HPI.   Past Medical History:  Diagnosis Date   Anxiety    Atrial fibrillation (HCC)    Intermittent   CAD (coronary artery disease)    non obstructive   CKD (chronic kidney disease)    Diabetes mellitus without complication (HCC)    Dysrhythmia    PAF   GERD (gastroesophageal reflux disease)    Glaucoma    Narrow Angle   Heart murmur    HTN (hypertension)    Hyperlipidemia    Hypothyroid    Major depression    10-17-2010: hospitalized for suicidal, [facility] (D/C 10-30-2010)   Migraine headache    OSA (obstructive sleep apnea)    CPAP nightly   Peripheral neuropathy    Presence of permanent cardiac pacemaker    Seasonal allergies    Sleep apnea    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 She has never used smokeless tobacco. She reports that she does not drink alcohol and does not use drugs.  Medications  Current Facility-Administered Medications:    aspirin chewable tablet 81 mg, 81 mg, Oral, Daily, Opyd, Lavone Neri, MD, 81 mg at 02/24/23 0812   dextrose 50 % solution 0-50 mL, 0-50 mL, Intravenous, PRN, Opyd, Lavone Neri, MD   gabapentin (NEURONTIN) capsule 300 mg, 300 mg, Oral, TID, Opyd, Lavone Neri, MD, 300 mg at 02/24/23 0811   galantamine (RAZADYNE ER) 24 hr capsule 8 mg, 8 mg, Oral, Daily, Opyd, Lavone Neri, MD, 8 mg at 02/24/23 1610   hydrOXYzine (ATARAX) tablet 25 mg, 25 mg, Oral, QHS, Opyd, Timothy S, MD, 25 mg at 02/23/23 2137   insulin aspart (novoLOG) injection 0-15 Units, 0-15 Units, Subcutaneous, Q4H, Berton Mount I, MD, 5 Units at 02/24/23 0839   insulin glargine-yfgn (SEMGLEE) injection 20 Units, 20 Units, Subcutaneous, QHS, Berton Mount I, MD, 20 Units at 02/23/23 2137   lactated ringers infusion, , Intravenous, Continuous, Opyd, Lavone Neri, MD, Stopped at 02/22/23 0024   levothyroxine (SYNTHROID) tablet 75 mcg, 75 mcg, Oral, Q0600, Opyd, Lavone Neri, MD, 75 mcg at 02/24/23 0526   melatonin tablet 3 mg, 3 mg, Oral, QHS, Opyd, Lavone Neri, MD, 3 mg at 02/23/23 2137  metFORMIN (GLUCOPHAGE) tablet 1,000 mg, 1,000 mg, Oral, BID WC, Berton Mount I, MD, 1,000 mg at 02/24/23 1610   nortriptyline (PAMELOR) capsule 100 mg, 100 mg, Oral, QHS, Opyd, Lavone Neri, MD, 100 mg at 02/23/23 2137   pantoprazole (PROTONIX) EC tablet 40 mg, 40 mg, Oral, BID, Opyd, Lavone Neri, MD, 40 mg at 02/24/23 0811   potassium chloride SA (KLOR-CON M) CR tablet 20 mEq, 20 mEq, Oral, Q6H, Elgergawy, Leana Roe, MD, 20 mEq at 02/24/23 9604   predniSONE (DELTASONE) tablet 20 mg, 20 mg, Oral, Q breakfast, Opyd, Lavone Neri, MD, 20 mg at 02/24/23 5409   pyridostigmine (MESTINON) tablet 30 mg, 30 mg, Oral, BID,  Elgergawy, Leana Roe, MD, 30 mg at 02/24/23 8119   Exam: Current vital signs: BP 117/85 (BP Location: Right Arm)   Pulse (!) 104   Temp 98.3 F (36.8 C) (Oral)   Resp (!) 24   Ht 5\' 1"  (1.549 m)   Wt 66.2 kg   SpO2 96%   BMI 27.58 kg/m  Vital signs in last 24 hours: Temp:  [98 F (36.7 C)-98.8 F (37.1 C)] 98.3 F (36.8 C) (05/20 0923) Pulse Rate:  [89-123] 104 (05/20 0923) Resp:  [14-24] 24 (05/20 0923) BP: (98-139)/(64-85) 117/85 (05/20 0923) SpO2:  [93 %-97 %] 96 % (05/20 0923)  GENERAL: Awake, alert, in no acute distress Psych: Affect appropriate for situation, patient is calm and cooperative with examination Head: Normocephalic and atraumatic, without obvious abnormality EENT: Normal conjunctivae, dry mucous membranes, no OP obstruction LUNGS: Normal respiratory effort. Non-labored breathing on room air CV: Regular rate and rhythm on telemetry Extremities: warm, well perfused, without obvious deformity  NEURO:  Mental Status: Awake, alert, and oriented to person, place, time, and situation. She is able to provide a clear and coherent history of present illness. Speech/Language: speech is clear and fluent.   No neglect is noted Cranial Nerves:  II: PERRL III, IV, VI: EOMI. Lid elevation symmetric and full.  Diplopia noted with lateral eye movement and sustained upgaze V: Sensation is intact to light touch and symmetrical to face.  VII: Face is symmetric resting and smiling. VIII: Hearing intact to voice IX, X: Phonation normal.  XI: Normal sternocleidomastoid and trapezius muscle strength XII: Tongue protrudes midline without fasciculations.   Motor: 5/5 neck flexion and extension, 5/5 bilateral upper extremity strength proximal and distal.  4 -/5 hip flexor, 4/5 knee extension and knee flexion, 4/5 dorsiflexion and plantarflexion Tone is normal. Bulk is normal.  Sensation: Intact to light touch bilaterally in all four extremities.  DTRs: Unable to elicit Gait:  Deferred  Able to count to 23 on single breath   Labs I have reviewed labs in epic and the results pertinent to this consultation are:   CBC    Component Value Date/Time   WBC 11.5 (H) 02/23/2023 0346   RBC 3.68 (L) 02/23/2023 0346   HGB 11.8 (L) 02/23/2023 0346   HCT 33.6 (L) 02/23/2023 0346   PLT 189 02/23/2023 0346   MCV 91.3 02/23/2023 0346   MCH 32.1 02/23/2023 0346   MCHC 35.1 02/23/2023 0346   RDW 15.1 02/23/2023 0346   LYMPHSABS 4.8 (H) 02/23/2023 0346   MONOABS 0.5 02/23/2023 0346   EOSABS 0.3 02/23/2023 0346   BASOSABS 0.1 02/23/2023 0346    CMP     Component Value Date/Time   NA 132 (L) 02/24/2023 0451   K 3.0 (L) 02/24/2023 0451   CL 100 02/24/2023 0451   CO2 24  02/24/2023 0451   GLUCOSE 134 (H) 02/24/2023 0451   BUN 9 02/24/2023 0451   CREATININE 0.60 02/24/2023 0451   CREATININE 0.93 06/18/2016 1528   CALCIUM 8.9 02/24/2023 0451   PROT 6.3 (L) 05/27/2022 1925   ALBUMIN 2.5 (L) 02/23/2023 0348   AST 18 05/27/2022 1925   ALT 18 05/27/2022 1925   ALKPHOS 93 05/27/2022 1925   BILITOT 0.5 05/27/2022 1925   GFRNONAA >60 02/24/2023 0451   GFRAA >60 10/16/2018 1916    Imaging I have reviewed the images obtained:  No relevant imaging this admission  Assessment: 82 year old patient with the above past medical history originally presented with generalized weakness and hyperglycemia and has been treated for DKA.  She states that for about the past 2 weeks, her symptoms of leg weakness and diplopia have been getting worse despite receiving maintenance IVIG 1 g/kg last Wednesday.  She did miss a dose of Mestinon yesterday and states that this was really her worst day in terms of leg weakness and she could not even stand then.  She verbalizes some improvement today but states symptoms are ongoing.  NIF is -40, patient is able to count to 23 on a single breath and has good neck strength so not worried about respiratory compromise at this point.  Will give an  additional 1 g/kg of IVIG to see if this improves symptoms.  Will continue Mestinon and recommend not checking magnesium and avoiding medications below that can aggravate MG.  Given persistent dysphagia, will consult SLP for swallowing evaluation and diet recommendations. Of note, patient has a history of 1 episode of A-fib around the time that her pacemaker was placed.  Per her daughter, on subsequent pacemaker checks, she has had no more episodes of A-fib and therefore is not on anticoagulation.  Impression: Myasthenia gravis flare  Recommendations: -Give 1 dose of IVIG 1 g/kg (to equal total treatment dose of 2g/kg over past 7 days) -Continue Mestinon 30 mg twice daily -Continue prednisone 20 mg daily -SLP evaluation for progressive dysphagia -Continue PT/OT - Every 8 hours NIFs and VC - Recommend elective intubation for respiratory compromise if VC falls below 15 to 20 mL/kg and or NIFs falls below -20cm/H2O. If poor effort and not sure, can always get ABG to assess for CO2 retention. Elective intubation for airway cmopromise if she has difficulty clearing her secretions. Oxygen saturation should not be used to make decision regarding intubation. - Medications that may worsen or trigger MG exacerbation: Class IA antiarrhythmics, magnesium, flouroquinolones, macrolides, aminoglycosides, penicillamine, curare, interferon alpha, botox, quinine. Use with caution: calcium channel blocker, beta blockers and statins.   Pt seen by NP/Neuro and later by MD. Note/plan to be edited by MD as needed.  Cortney E Ernestina Columbia , MSN, AGACNP-BC [facility] See Amion for schedule and pager information 02/24/2023 10:18 AM    Attending Neurohospitalist Addendum Patient seen and examined with APP/Resident. Agree with the history and physical as documented above. Agree with the plan as documented, which I helped formulate. I have edited the note above to reflect my full findings and  recommendations. I have independently reviewed the chart, obtained history, review of systems and examined the patient.I have personally reviewed pertinent head/neck/spine imaging (CT/MRI). Please feel free to call with any questions.  -- Bing Neighbors, MD [facility] [phone]  If 7pm- 7am, please page neurology on call as listed in AMION.

## 2023-02-24 NOTE — Progress Notes (Addendum)
SLP Cancellation Note  Patient Details Name: Heidi [name] MRN: [id number] DOB: 12/23/1940   Cancelled treatment:       Reason Eval/Treat Not Completed: Patient at procedure or test/unavailable. Pt working with OT. SLP able to chat with pt a bit, who is familiar to me. Pt has been eating some soft foods from her tray (macaroni and cheese) but also salad. She was not able to eat the past week, but is getting IVIG currently and already feels better with her swallowing. Will f/u for further assessment tomorrow, continue current diet.    Dion Sibal, Riley Nearing 02/24/2023, 2:54 PM

## 2023-02-24 NOTE — Progress Notes (Signed)
NIF: Greater than -40  With excellent patient effort.  

## 2023-02-24 NOTE — NC FL2 (Signed)
[place] MEDICAID FL2 LEVEL OF CARE FORM     IDENTIFICATION  Patient Name: Heidi [name] Birthdate: 07-14-1941 Sex: female Admission Date (Current Location): 02/21/2023  [facility] and [place] Number:  Producer, television/film/video and Address:  [facility] [place], [address], [place], [place] [address]      Provider Number: [id number]  Attending Physician Name and Address:  Elgergawy, Leana Roe, MD  Relative Name and Phone Number:       Current Level of Care: Hospital Recommended Level of Care: Skilled Nursing Facility Prior Approval Number:    Date Approved/Denied:   PASRR Number: [id number]  Discharge Plan: SNF    Current Diagnoses: Patient Active Problem List   Diagnosis Date Noted   DKA (diabetic ketoacidosis) (HCC) 02/21/2023   AKI (acute kidney injury) (HCC) 02/21/2023   Myasthenia gravis (HCC) 11/12/2022   Stroke-like symptoms 05/30/2022   Hypomagnesemia 05/28/2022   Ataxia 05/28/2022   Falls, initial encounter 05/27/2022   TIA (transient ischemic attack) 05/26/2022   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   HLD (hyperlipidemia) 05/30/2017   GERD (gastroesophageal reflux disease) 05/30/2017   UTI (urinary tract infection) 05/30/2017   Acute metabolic encephalopathy 05/30/2017   Type II diabetes mellitus with renal manifestations (HCC) 05/30/2017   Hypothyroidism 05/30/2017   Depression 05/30/2017   CAD (coronary artery disease) 05/30/2017   CKD (chronic kidney disease), stage III (HCC) 05/30/2017   Chronic diastolic CHF (congestive heart failure) (HCC) 05/30/2017   Dementia with behavioral disturbance (HCC) 05/30/2017   Essential hypertension, benign 06/15/2014   Allergic reaction 04/28/2012   Tachycardia-bradycardia (HCC) 12/24/2011   Pacemaker-mdt 12/24/2011   HTN (hypertension) 12/24/2011   A-fib (HCC) 11/07/2011    Orientation RESPIRATION BLADDER Height & Weight     Self, Time, Situation, Place  Normal  Incontinent, External catheter Weight: 145 lb 15.1 oz (66.2 kg) Height:  5\' 1"  (154.9 cm)  BEHAVIORAL SYMPTOMS/MOOD NEUROLOGICAL BOWEL NUTRITION STATUS      Continent Diet (See dc summary)  AMBULATORY STATUS COMMUNICATION OF NEEDS Skin   Limited Assist Verbally Normal                       Personal Care Assistance Level of Assistance  Bathing, Feeding, Dressing Bathing Assistance: Limited assistance Feeding assistance: Independent Dressing Assistance: Limited assistance     Functional Limitations Info  Sight Sight Info: Impaired        SPECIAL CARE FACTORS FREQUENCY  PT (By licensed PT), OT (By licensed OT)     PT Frequency: 5x/week OT Frequency: 5x/week            Contractures Contractures Info: Not present    Additional Factors Info  Code Status, Allergies, Insulin Sliding Scale Code Status Info: Full Allergies Info: Crestor (Rosuvastatin Calcium), Effexor (Venlafaxine Hydrochloride), Aricept (Donepezil), Cream Base, Latex, Nardil (Phenelzine), Pork-derived Products, Hibiclens (Chlorhexidine Gluconate), Lipitor (Atorvastatin Calcium), Lisinopril, Penicillins, Plavix (Clopidogrel Bisulfate), Simvastatin, Sulfa Antibiotics, Sulfonamide Derivatives, Tape   Insulin Sliding Scale Info: See dc summary       Current Medications (02/24/2023):  This is the current hospital active medication list Current Facility-Administered Medications  Medication Dose Route Frequency Provider Last Rate Last Admin   aspirin chewable tablet 81 mg  81 mg Oral Daily Opyd, Lavone Neri, MD   81 mg at 02/24/23 0812   dextrose 50 % solution 0-50 mL  0-50 mL Intravenous PRN Opyd, Lavone Neri, MD       enoxaparin (  LOVENOX) injection 40 mg  40 mg Subcutaneous Q24H Elgergawy, Leana Roe, MD   40 mg at 02/24/23 1300   gabapentin (NEURONTIN) capsule 300 mg  300 mg Oral TID Briscoe Deutscher, MD   300 mg at 02/24/23 1610   galantamine (RAZADYNE ER) 24 hr capsule 8 mg  8 mg Oral Daily Opyd, Lavone Neri, MD   8  mg at 02/24/23 9604   hydrocortisone (ANUSOL-HC) suppository 25 mg  25 mg Rectal BID Elgergawy, Leana Roe, MD       hydrOXYzine (ATARAX) tablet 25 mg  25 mg Oral QHS Opyd, Lavone Neri, MD   25 mg at 02/23/23 2137   insulin aspart (novoLOG) injection 0-15 Units  0-15 Units Subcutaneous Q4H Berton Mount I, MD   5 Units at 02/24/23 1301   insulin glargine-yfgn (SEMGLEE) injection 24 Units  24 Units Subcutaneous QHS Elgergawy, Leana Roe, MD       lactated ringers infusion   Intravenous Continuous Opyd, Lavone Neri, MD   Stopped at 02/22/23 0024   levothyroxine (SYNTHROID) tablet 75 mcg  75 mcg Oral Q0600 Briscoe Deutscher, MD   75 mcg at 02/24/23 0526   melatonin tablet 3 mg  3 mg Oral QHS Opyd, Lavone Neri, MD   3 mg at 02/23/23 2137   metFORMIN (GLUCOPHAGE) tablet 1,000 mg  1,000 mg Oral BID WC Berton Mount I, MD   1,000 mg at 02/24/23 5409   nortriptyline (PAMELOR) capsule 100 mg  100 mg Oral QHS Opyd, Lavone Neri, MD   100 mg at 02/23/23 2137   pantoprazole (PROTONIX) EC tablet 40 mg  40 mg Oral BID Briscoe Deutscher, MD   40 mg at 02/24/23 8119   predniSONE (DELTASONE) tablet 20 mg  20 mg Oral Q breakfast Opyd, Lavone Neri, MD   20 mg at 02/24/23 1478   pyridostigmine (MESTINON) tablet 30 mg  30 mg Oral BID Elgergawy, Leana Roe, MD   30 mg at 02/24/23 2956     Discharge Medications: Please see discharge summary for a list of discharge medications.  Relevant Imaging Results:  Relevant Lab Results:   Additional Information SSN [id number]  Mearl Latin, LCSW

## 2023-02-24 NOTE — Progress Notes (Signed)
MEWS Progress Note  Patient Details Name: Heidi [name] MRN: [id number] DOB: 1941/02/25 Today's Date: 02/24/2023   MEWS Flowsheet Documentation:  Assess: MEWS Score Temp: 98.3 F (36.8 C) BP: 112/76 MAP (mmHg): 89 Pulse Rate: (!) 106 ECG Heart Rate: (!) 106 Resp: (!) 23 Level of Consciousness: Alert SpO2: 96 % O2 Device: Room Air Patient Activity (if Appropriate): In chair Assess: MEWS Score MEWS Temp: 0 MEWS Systolic: 0 MEWS Pulse: 1 MEWS RR: 1 MEWS LOC: 0 MEWS Score: 2 MEWS Score Color: Yellow Assess: SIRS CRITERIA SIRS Temperature : 0 SIRS Respirations : 1 SIRS Pulse: 1 SIRS WBC: 0 SIRS Score Sum : 2 SIRS Temperature : 0 SIRS Pulse: 1 SIRS Respirations : 1 SIRS WBC: 0 SIRS Score Sum : 2 Assess: if the MEWS score is Yellow or Red Were vital signs taken at a resting state?: Yes Focused Assessment: No change from prior assessment Does the patient meet 2 or more of the SIRS criteria?: Yes Does the patient have a confirmed or suspected source of infection?: No MEWS guidelines implemented : Yes, yellow Treat MEWS Interventions: Considered administering scheduled or prn medications/treatments as ordered Take Vital Signs Increase Vital Sign Frequency : Yellow: Q2hr x1, continue Q4hrs until patient remains green for 12hrs Escalate MEWS: Escalate: Yellow: Discuss with charge nurse and consider notifying provider and/or RRT Notify: Charge Nurse/RN Name of Charge Nurse/RN Notified: Delicia , RN Provider Notification Date Provider Notified: 02/21/23 Time Provider Notified: 2036 Test performed and critical result: serum osmol 354 Date Critical Result Received: 02/21/23 Time Critical Result Received: 2035 Provider response: In department, See new orders      [facility] 02/24/2023, 11:13 AM

## 2023-02-24 NOTE — TOC Initial Note (Signed)
Transition of Care [facility]) - Initial/Assessment Note    Patient Details  Name: Heidi [name] MRN: [id number] Date of Birth: Aug 28, 1941  [facility] CM/SW Contact:    Mearl Latin, LCSW Phone Number: 02/24/2023, 2:56 PM  Clinical Narrative:                 Per therapy, patient requesting SNF placement at [facility]. Per [facility], they can accept patient pending insurance authorization. Patient will need to bring her CPAP from home.   Expected Discharge Plan: Skilled Nursing Facility Barriers to Discharge: Continued Medical Work up, English as a second language teacher   Patient Goals and CMS Choice Patient states their goals for this hospitalization and ongoing recovery are:: Rehab CMS Medicare.gov Compare Post Acute Care list provided to:: Patient Choice offered to / list presented to : Patient [place] ownership interest in [facility].provided to:: Patient    Expected Discharge Plan and Services In-house Referral: Clinical Social Work   Post Acute Care Choice: Skilled Nursing Facility Living arrangements for the past 2 months: Single Family Home                                      Prior Living Arrangements/Services Living arrangements for the past 2 months: Single Family Home   Patient language and need for interpreter reviewed:: Yes Do you feel safe going back to the place where you live?: Yes      Need for Family Participation in Patient Care: Yes (Comment) Care giver support system in place?: Yes (comment)   Criminal Activity/Legal Involvement Pertinent to Current Situation/Hospitalization: No - Comment as needed  Activities of Daily Living Home Assistive Devices/Equipment: Eyeglasses ADL Screening (condition at time of admission) Patient's cognitive ability adequate to safely complete daily activities?: Yes Is the patient deaf or have difficulty hearing?: Yes Does the patient have difficulty seeing, even when wearing  glasses/contacts?: No Does the patient have difficulty concentrating, remembering, or making decisions?: Yes Patient able to express need for assistance with ADLs?: Yes Does the patient have difficulty dressing or bathing?: No Independently performs ADLs?: Yes (appropriate for developmental age) Does the patient have difficulty walking or climbing stairs?: Yes Weakness of Legs: Both Weakness of Arms/Hands: None  Permission Sought/Granted Permission sought to share information with : Facility Industrial/product designer granted to share information with : Yes, Verbal Permission Granted     Permission granted to share info w AGENCY: SNFs        Emotional Assessment Appearance:: Appears stated age Attitude/Demeanor/Rapport: Engaged Affect (typically observed): Accepting Orientation: : Oriented to Self, Oriented to Place, Oriented to  Time, Oriented to Situation Alcohol / Substance Use: Not Applicable Psych Involvement: No (comment)  Admission diagnosis:  DKA (diabetic ketoacidosis) (HCC) [E11.10] Diabetic ketoacidosis without coma associated with type 2 diabetes mellitus (HCC) [E11.10] Patient Active Problem List   Diagnosis Date Noted   DKA (diabetic ketoacidosis) (HCC) 02/21/2023   AKI (acute kidney injury) (HCC) 02/21/2023   Myasthenia gravis (HCC) 11/12/2022   Stroke-like symptoms 05/30/2022   Hypomagnesemia 05/28/2022   Ataxia 05/28/2022   Falls, initial encounter 05/27/2022   TIA (transient ischemic attack) 05/26/2022   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   HLD (hyperlipidemia) 05/30/2017   GERD (gastroesophageal reflux disease) 05/30/2017   UTI (urinary tract infection) 05/30/2017   Acute metabolic encephalopathy 05/30/2017   Type II diabetes mellitus with  renal manifestations (HCC) 05/30/2017   Hypothyroidism 05/30/2017   Depression 05/30/2017   CAD (coronary artery disease) 05/30/2017   CKD (chronic kidney disease), stage III  (HCC) 05/30/2017   Chronic diastolic CHF (congestive heart failure) (HCC) 05/30/2017   Dementia with behavioral disturbance (HCC) 05/30/2017   Essential hypertension, benign 06/15/2014   Allergic reaction 04/28/2012   Tachycardia-bradycardia (HCC) 12/24/2011   Pacemaker-mdt 12/24/2011   HTN (hypertension) 12/24/2011   A-fib (HCC) 11/07/2011   PCP:  Uvaldo Bristle, NP Pharmacy:  No Pharmacies Listed    Social Determinants of Health (SDOH) Social History: SDOH Screenings   Food Insecurity: No Food Insecurity (11/12/2022)  Housing: Low Risk  (11/12/2022)  Transportation Needs: No Transportation Needs (11/12/2022)  Utilities: Not At Risk (11/12/2022)  Tobacco Use: Medium Risk (02/21/2023)   SDOH Interventions:     Readmission Risk Interventions     No data to display

## 2023-02-24 NOTE — Progress Notes (Addendum)
NIF -40  Good patient effort.  Refused CPAP at this time.

## 2023-02-24 NOTE — Evaluation (Signed)
Physical Therapy Evaluation Patient Details Name: Heidi [name] MRN: [id number] DOB: 1941-01-29 Today's Date: 02/24/2023  History of Present Illness  Patient is an 82 year old female who came to ED with progressive weakness and blood sugar of 737. Pt admitted with DKA. PMH: type 2 diabetes mellitus, coronary artery disease, atrial fibrillation but not anticoagulated, myasthenia gravis, hypertension, depression, and OSA on CPAP.   Clinical Impression  Pt admitted with above. Up until 2 months ago pt was ambulating with rollator and indep with ADLs. Pt has had recent falls and has resigned to using her w/c now as her LEs are weak. Began gait training today in [place] with marching. Pt demo's excellent rehab potential and would benefit from rehab program <3 hours a day to achieve safe mod I level of function prior to return home to ALF apartment. Acute PT to cont to follow.       Recommendations for follow up therapy are one component of a multi-disciplinary discharge planning process, led by the attending physician.  Recommendations may be updated based on patient status, additional functional criteria and insurance authorization.  Follow Up Recommendations Can patient physically be transported by private vehicle: Yes     Assistance Recommended at Discharge Frequent or constant Supervision/Assistance  Patient can return home with the following  A lot of help with walking and/or transfers;A little help with bathing/dressing/bathroom;Direct supervision/assist for medications management;Assist for transportation    Equipment Recommendations None recommended by PT  Recommendations for Other Services       Functional Status Assessment Patient has had a recent decline in their functional status and demonstrates the ability to make significant improvements in function in a reasonable and predictable amount of time.     Precautions / Restrictions Precautions Precautions: Fall Restrictions Weight  Bearing Restrictions: No      Mobility  Bed Mobility               General bed mobility comments: pt received up in chair upon PT arrival    Transfers Overall transfer level: Needs assistance Equipment used: Rolling walker (2 wheels) Transfers: Sit to/from Stand Sit to Stand: Min assist           General transfer comment: increased time, minA to power up and steady during transition of hands. completed 3 sit to stand, pt with noted labored breathing suspect due to anxiety re: falling    Ambulation/Gait               General Gait Details: due to pt's bilat LE weakness and PT not having a second person limited to, 2 bouts of marching in place. Pt marched x 8 first time prior to sitting in chair, 16x the second bout. Pt reports "my legs were going to go." Pt reports "this is much better than before but I still feel so weak." Pt with SOB however SpO2 >98% on RA. suspect SOB due to anxiety  Stairs            Wheelchair Mobility    Modified Rankin (Stroke Patients Only)       Balance Overall balance assessment: Needs assistance Sitting-balance support: Feet supported, No upper extremity supported Sitting balance-Leahy Scale: Fair     Standing balance support: Bilateral upper extremity supported, During functional activity, Reliant on assistive device for balance Standing balance-Leahy Scale: Poor Standing balance comment: reliant on external support  Pertinent Vitals/Pain Pain Assessment Pain Assessment: No/denies pain    Home Living Family/patient expects to be discharged to:: Skilled nursing facility                   Additional Comments: pt resides in [facility]. Pt has handicapped accessible bathroom.    Prior Function Prior Level of Function : Needs assist             Mobility Comments: pt reports using her w/c as primary mode of mobility instead of her rollator due to progressive leg  weakness and freq falls. Staff at facility will push her down to the dinning hall if she wished to eat there. ADLs Comments: pt reports completing her dressing/bathing on her own, doing std pvt transfers from w/c to shower chair/commode, pt makes her self toast or cereal for breakfast and ALF provides lunch and dinner     Hand Dominance   Dominant Hand: Right    Extremity/Trunk Assessment   Upper Extremity Assessment Upper Extremity Assessment: Generalized weakness    Lower Extremity Assessment Lower Extremity Assessment: Generalized weakness    Cervical / Trunk Assessment Cervical / Trunk Assessment: Normal  Communication   Communication: No difficulties  Cognition Arousal/Alertness: Awake/alert Behavior During Therapy: WFL for tasks assessed/performed Overall Cognitive Status: Within Functional Limits for tasks assessed                                          General Comments General comments (skin integrity, edema, etc.): VSS    Exercises General Exercises - Lower Extremity Ankle Circles/Pumps: AROM, Both, 10 reps, Seated Gluteal Sets: AROM, Both, 10 reps, Seated (with 5 sec hold) Long Arc Quad: AROM, Both, 10 reps, Seated (with 5 sec hold at end range)   Assessment/Plan    PT Assessment Patient needs continued PT services  PT Problem List Decreased strength;Decreased activity tolerance;Decreased balance;Decreased mobility;Decreased coordination;Decreased cognition       PT Treatment Interventions DME instruction;Gait training;Functional mobility training;Therapeutic activities;Therapeutic exercise;Balance training    PT Goals (Current goals can be found in the Care Plan section)  Acute Rehab PT Goals Patient Stated Goal: walk PT Goal Formulation: With patient Time For Goal Achievement: 03/10/23 Potential to Achieve Goals: Good    Frequency Min 3X/week     Co-evaluation               AM-PAC PT "6 Clicks" Mobility  Outcome Measure  Help needed turning from your back to your side while in a flat bed without using bedrails?: A Little Help needed moving from lying on your back to sitting on the side of a flat bed without using bedrails?: A Little Help needed moving to and from a bed to a chair (including a wheelchair)?: A Lot Help needed standing up from a chair using your arms (e.g., wheelchair or bedside chair)?: A Lot Help needed to walk in hospital room?: A Lot Help needed climbing 3-5 steps with a railing? : A Lot 6 Click Score: 14    End of Session Equipment Utilized During Treatment: Gait belt Activity Tolerance: Patient limited by fatigue Patient left: in chair;with call bell/phone within reach;with chair alarm set Nurse Communication: Mobility status PT Visit Diagnosis: Unsteadiness on feet (R26.81);Muscle weakness (generalized) (M62.81);Difficulty in walking, not elsewhere classified (R26.2)    Time: 1610-9604 PT Time Calculation (min) (ACUTE ONLY): 20 min   Charges:  PT Evaluation $PT Eval Moderate Complexity: 1 Mod          Lewis Shock, PT, DPT Acute Rehabilitation Services Secure chat preferred Office #: [phone]   Iona Hansen 02/24/2023, 1:42 PM

## 2023-02-24 NOTE — Progress Notes (Signed)
PROGRESS NOTE    Heidi [name]  ZOX:[id number] DOB: 1941/02/01 DOA: 02/21/2023 PCP: Uvaldo Bristle, NP  Outpatient Specialists:     Brief Narrative:  Patient is an 82 year old female with past medical history significant for type 2 diabetes mellitus, coronary artery disease, atrial fibrillation but not anticoagulated, myasthenia gravis, hypertension, depression, and OSA on CPAP.  Patient was admitted with DKA.  Apparently, patient has been on metformin, but patient stopped checking blood sugar about a year ago.  On presentation to the hospital, blood sugar was 737 with anion gap of 23. She was admitted for further workup, kept on insulin gtt., she has been transitioned to insulin once her anion gap has closed.    Assessment & Plan:   Principal Problem:   DKA (diabetic ketoacidosis) (HCC) Active Problems:   Myasthenia gravis (HCC)   A-fib (HCC)   HTN (hypertension)   Hypothyroidism   CAD (coronary artery disease)   Chronic diastolic CHF (congestive heart failure) (HCC)   AKI (acute kidney injury) (HCC)   Diabetes mellitus, type II, non-insulin-dependent with DKA  -History of diabetes mellitus, not requiring insulin, presents with DKA, most likely due to steroids and diet indiscretion. -Anion gap has closed, transition to Semglee and insulin sliding scale, discussed with staff to educate her about how to inject insulin as likely she will need at time of discharge -BG remains elevated, will increase Semglee to 24 units daily.   AKI   - SCr is 1.61 on admission, up from 0.76 in February  - Hold diuretics, continue IVF hydration, repeat chem panel 02/22/2023: AKI has resolved.   CAD  - No angina  - Continue ASA and statin     Depression, anxiety, dementia  - Continue galantamine, nortriptyline, and hydroxyzine, use delirium precautions     Chronic diastolic CHF  -Compensated.   Myasthenia gravis  -Patient reports progressive weakness, she appears to be significantly  deconditioned and weak on physical exam, neurology has been consulted. -Continue with home dose prednisone 20 mg oral daily. -I will hold her Toprol-XL dose till seen by neurology to rule out crisis. -NIF is -40.  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 Graciela Husbands, on Toprol-XL, as well has pacemaker, I will hold her Toprol-XL today given concern of worsening myasthenia gravis, will await neurology evaluation before resuming it.   Hypokalemia -Repleted, recheck in AM.   DVT prophylaxis: SCD.will start Lovenox Code Status: Full code. Family Communication: None at bedside, D/W daughter by phone. Disposition Plan: She is significantly weak, PT evaluation ending   Consultants:  Neurology.  Procedures:  None.  Antimicrobials:  None.   Subjective:  No nausea, no vomiting, she reports significant weakness, but she denies any dyspnea .  Objective: Vitals:   02/24/23 0000 02/24/23 0007 02/24/23 0550 02/24/23 0923  BP: 98/83  125/67 117/85  Pulse: (!) 101 99 89 (!) 104  Resp: 18 15 15  (!) 24  Temp: 98 F (36.7 C)  98.2 F (36.8 C) 98.3 F (36.8 C)  TempSrc: Oral  Oral Oral  SpO2: 93% 94% 96% 96%  Weight:      Height:        Intake/Output Summary (Last 24 hours) at  02/24/2023 1028 Last data filed at 02/24/2023 0827 Gross per 24 hour  Intake 770 ml  Output 700 ml  Net 70 ml   Filed Weights   02/21/23 2125  Weight: 66.2 kg    Examination:  Awake Alert, Oriented X 3, No new F.N deficits, Normal affect Symmetrical Chest wall movement, Good air movement bilaterally, CTAB RRR,No Gallops,Rubs or new Murmurs, No Parasternal Heave +ve B.Sounds, Abd Soft, No tenderness, No rebound - guarding or rigidity. No Cyanosis, Clubbing or edema, No new  Rash or bruise    Data Reviewed: I have personally reviewed following labs and imaging studies  CBC: Recent Labs  Lab 02/21/23 1859 02/21/23 1937 02/23/23 0346  WBC 15.3*  --  11.5*  NEUTROABS  --   --  5.8  HGB 14.3 13.9 11.8*  HCT 41.3 41.0 33.6*  MCV 88.2  --  91.3  PLT 389  --  189   Basic Metabolic Panel: Recent Labs  Lab 02/22/23 0432 02/22/23 0826 02/22/23 1405 02/23/23 0348 02/24/23 0451  NA 138 137 135 137 132*  K 3.4* 3.7 4.3 3.2* 3.0*  CL 105 104 100 104 100  CO2 24 24 24 24 24   GLUCOSE 188* 186* 342* 96 134*  BUN 16 18 15 11 9   CREATININE 0.92 0.88 0.82 0.75 0.60  CALCIUM 9.8 9.7 9.4 9.1 8.9  MG  --   --   --  1.7  --   PHOS  --   --   --  1.7*  --    GFR: Estimated Creatinine Clearance: 48.1 mL/min (by C-G formula based on SCr of 0.6 mg/dL). Liver Function Tests: Recent Labs  Lab 02/23/23 0348  ALBUMIN 2.5*   No results for input(s): "LIPASE", "AMYLASE" in the last 168 hours. No results for input(s): "AMMONIA" in the last 168 hours. Coagulation Profile: No results for input(s): "INR", "PROTIME" in the last 168 hours. Cardiac Enzymes: No results for input(s): "CKTOTAL", "CKMB", "CKMBINDEX", "TROPONINI" in the last 168 hours. BNP (last 3 results) No results for input(s): "PROBNP" in the last 8760 hours. HbA1C: No results for input(s): "HGBA1C" in the last 72 hours. CBG: Recent Labs  Lab 02/23/23 1618 02/23/23 1934 02/24/23 0045 02/24/23 0505 02/24/23 0833  GLUCAP 304* 199* 240* 152* 209*   Lipid Profile: No results for input(s): "CHOL", "HDL", "LDLCALC", "TRIG", "CHOLHDL", "LDLDIRECT" in the last 72 hours. Thyroid Function Tests: No results for input(s): "TSH", "T4TOTAL", "FREET4", "T3FREE", "THYROIDAB" in the last 72 hours. Anemia Panel: No results for input(s): "VITAMINB12", "FOLATE", "FERRITIN", "TIBC", "IRON", "RETICCTPCT" in the last 72 hours. Urine analysis:    Component Value Date/Time   COLORURINE YELLOW 02/21/2023 2015    APPEARANCEUR CLEAR 02/21/2023 2015   LABSPEC 1.030 02/21/2023 2015   PHURINE 5.0 02/21/2023 2015   GLUCOSEU >=500 (A) 02/21/2023 2015   HGBUR NEGATIVE 02/21/2023 2015   BILIRUBINUR NEGATIVE 02/21/2023 2015   KETONESUR 20 (A) 02/21/2023 2015   PROTEINUR 30 (A) 02/21/2023 2015   UROBILINOGEN 1.0 10/17/2010 1558   NITRITE NEGATIVE 02/21/2023 2015   LEUKOCYTESUR NEGATIVE 02/21/2023 2015   Sepsis Labs: @LABRCNTIP (procalcitonin:4,lacticidven:4)  ) Recent Results (from the past 240 hour(s))  Resp panel by RT-PCR (RSV, Flu A&B, Covid) Anterior Nasal Swab     Status: None   Collection Time: 02/21/23  8:36 PM   Specimen: Anterior Nasal Swab  Result Value Ref Range Status   SARS Coronavirus 2 by RT PCR NEGATIVE NEGATIVE Final   Influenza A by PCR NEGATIVE NEGATIVE Final  Influenza B by PCR NEGATIVE NEGATIVE Final    Comment: (NOTE) The Xpert Xpress SARS-CoV-2/FLU/RSV plus assay is intended as an aid in the diagnosis of influenza from Nasopharyngeal swab specimens and should not be used as a sole basis for treatment. Nasal washings and aspirates are unacceptable for Xpert Xpress SARS-CoV-2/FLU/RSV testing.  Fact Sheet for Patients: BloggerCourse.com  Fact Sheet for Healthcare Providers: SeriousBroker.it  This test is not yet approved or cleared by the [place] FDA and has been authorized for detection and/or diagnosis of SARS-CoV-2 by FDA under an Emergency Use Authorization (EUA). This EUA will remain in effect (meaning this test can be used) for the duration of the COVID-19 declaration under Section 564(b)(1) of the Act, 21 U.S.C. section 360bbb-3(b)(1), unless the authorization is terminated or revoked.     Resp Syncytial Virus by PCR NEGATIVE NEGATIVE Final    Comment: (NOTE) Fact Sheet for Patients: BloggerCourse.com  Fact Sheet for Healthcare  Providers: SeriousBroker.it  This test is not yet approved or cleared by the [place] FDA and has been authorized for detection and/or diagnosis of SARS-CoV-2 by FDA under an Emergency Use Authorization (EUA). This EUA will remain in effect (meaning this test can be used) for the duration of the COVID-19 declaration under Section 564(b)(1) of the Act, 21 U.S.C. section 360bbb-3(b)(1), unless the authorization is terminated or revoked.  Performed at [facility], [address] [place], [place] [address]          Radiology Studies: No results found.      Scheduled Meds:  aspirin  81 mg Oral Daily   gabapentin  300 mg Oral TID   galantamine  8 mg Oral Daily   hydrOXYzine  25 mg Oral QHS   insulin aspart  0-15 Units Subcutaneous Q4H   insulin glargine-yfgn  20 Units Subcutaneous QHS   levothyroxine  75 mcg Oral Q0600   melatonin  3 mg Oral QHS   metFORMIN  1,000 mg Oral BID WC   nortriptyline  100 mg Oral QHS   pantoprazole  40 mg Oral BID   potassium chloride  20 mEq Oral Q6H   predniSONE  20 mg Oral Q breakfast   pyridostigmine  30 mg Oral BID   Continuous Infusions:  Immune Globulin 10%     lactated ringers Stopped (02/22/23 0024)     LOS: 3 days        Huey Bienenstock , MD  [facility] 7PM-7AM contact night coverage as above

## 2023-02-24 NOTE — Care Management Important Message (Signed)
Important Message  Patient Details  Name: MYLEY TREHARNE MRN: [id number] Date of Birth: 23-Apr-1941   Medicare Important Message Given:  Yes     Dorena Bodo 02/24/2023, 3:09 PM

## 2023-02-25 DIAGNOSIS — E1110 Type 2 diabetes mellitus with ketoacidosis without coma: Secondary | ICD-10-CM | POA: Diagnosis not present

## 2023-02-25 DIAGNOSIS — G7000 Myasthenia gravis without (acute) exacerbation: Secondary | ICD-10-CM | POA: Diagnosis not present

## 2023-02-25 LAB — GLUCOSE, CAPILLARY
Glucose-Capillary: 104 mg/dL — ABNORMAL HIGH (ref 70–99)
Glucose-Capillary: 108 mg/dL — ABNORMAL HIGH (ref 70–99)
Glucose-Capillary: 194 mg/dL — ABNORMAL HIGH (ref 70–99)
Glucose-Capillary: 215 mg/dL — ABNORMAL HIGH (ref 70–99)
Glucose-Capillary: 203 mg/dL — ABNORMAL HIGH (ref 70–99)
Glucose-Capillary: 177 mg/dL — ABNORMAL HIGH (ref 70–99)

## 2023-02-25 LAB — BASIC METABOLIC PANEL
Anion gap: 8 (ref 5–15)
BUN: 12 mg/dL (ref 8–23)
CO2: 23 mmol/L (ref 22–32)
Calcium: 9.1 mg/dL (ref 8.9–10.3)
Chloride: 104 mmol/L (ref 98–111)
Creatinine, Ser: 0.8 mg/dL (ref 0.44–1.00)
GFR, Estimated: >60 mL/min (ref >60)
Glucose, Bld: 131 mg/dL — ABNORMAL HIGH (ref 70–99)
Potassium: 3.1 mmol/L — ABNORMAL LOW (ref 3.5–5.1)
Sodium: 135 mmol/L (ref 135–145)

## 2023-02-25 LAB — PHOSPHORUS: Phosphorus: 3.2 mg/dL (ref 2.5–4.6)

## 2023-02-25 LAB — MAGNESIUM: Magnesium: 1.3 mg/dL — ABNORMAL LOW (ref 1.7–2.4)

## 2023-02-25 MED ORDER — POTASSIUM CHLORIDE CRYS ER 20 MEQ PO TBCR
40.0000 meq | EXTENDED_RELEASE_TABLET | Freq: Four times a day (QID) | ORAL | Status: AC
  Administered 2023-02-25 (×2): 40 meq via ORAL
  Filled 2023-02-25 (×2): qty 2

## 2023-02-25 MED ORDER — POTASSIUM CHLORIDE 10 MEQ/100ML IV SOLN
10.0000 meq | Freq: Once | INTRAVENOUS | Status: AC
  Administered 2023-02-25: 10 meq via INTRAVENOUS
  Filled 2023-02-25: qty 100

## 2023-02-25 NOTE — Evaluation (Signed)
Clinical/Bedside Swallow Evaluation Patient Details  Name: ODIA TOLLIVER MRN: [id number] Date of Birth: 23-Jun-1941  Today's Date: 02/25/2023 Time: SLP Start Time (ACUTE ONLY): 0935 SLP Stop Time (ACUTE ONLY): 1030 SLP Time Calculation (min) (ACUTE ONLY): 55 min  Past Medical History:  Past Medical History:  Diagnosis Date   Anxiety    Atrial fibrillation (HCC)    Intermittent   CAD (coronary artery disease)    non obstructive   CKD (chronic kidney disease)    Diabetes mellitus without complication (HCC)    Dysrhythmia    PAF   GERD (gastroesophageal reflux disease)    Glaucoma    Narrow Angle   Heart murmur    HTN (hypertension)    Hyperlipidemia    Hypothyroid    Major depression    10-17-2010: hospitalized for suicidal, [facility] (D/C 10-30-2010)   Migraine headache    OSA (obstructive sleep apnea)    CPAP nightly   Peripheral neuropathy    Presence of permanent cardiac pacemaker    Seasonal allergies    Sleep apnea    Tachy-brady syndrome (HCC) 04/06/2006   PPM placed   Past Surgical History:  Past Surgical History:  Procedure Laterality Date   ABDOMINAL ADHESION SURGERY  2008   exploratory to remove attached to the abdominal wall, stomach and intestines.    ABDOMINAL HYSTERECTOMY  1976   APPENDECTOMY     BREAST EXCISIONAL BIOPSY     BREAST SURGERY     fibroids removed, benign   CARDIAC CATHETERIZATION  02-12-2006   Minor irregularities: RCA-mid 40%, LM-normal, LAD-normal. EF 65%.   CHOLECYSTECTOMY     ECTOPIC PREGNANCY SURGERY     treatment x3, last one 11-02-2009 at [facility]   goiter     goiter removed     MASS EXCISION Right 10/18/2015   Procedure: EXCISION OF 6CM MASS ON RIGHT  NECK/BACK;  Surgeon: Luretha Murphy, MD;  Location: [facility];  Service: General;  Laterality: Right;   PACEMAKER GENERATOR CHANGE N/A 01/15/2012   Procedure: PACEMAKER GENERATOR CHANGE;  Surgeon: Duke Salvia, MD;  Location: [facility] CATH  LAB;  Service: Cardiovascular;  Laterality: N/A;   PACEMAKER INSERTION     Permanent. [employer] EnRhyth 04/2006, set as DDDR   REVERSE SHOULDER ARTHROPLASTY Right 05/01/2022   Procedure: REVERSE SHOULDER ARTHROPLASTY;  Surgeon: Bjorn Pippin, MD;  Location: [facility] ORS;  Service: Orthopedics;  Laterality: Right;   TUMOR REMOVAL  2000   Fibroid from breast x4-most recent   WRIST FRACTURE SURGERY  11-2007   right, had metal plate inserted   HPI:  EMMELYNN BEECHER is a 82 y.o. female currently residing at ALF who initially presented in DKA with generalized weakness, slurred speech, confusion.  Has a history of MG and patient has been having progressive dysphagia issues with her myasthenia gravis which made it difficult for her to eat anything other than ice cream and soft fruit.  Unfortunately, this caused her blood sugar to become very high and she went into DKA.  She has been treated for this but states that she is having increased weakness of the lower extremities as well as diplopia with sustained upgaze and lateral eye movement.  She has been receiving IVIG as an outpatient 1 g/kg and got her last dose last Wednesday. Pt has been seen by SLP in prior visits, has been able to tolerate regualr diet and thin liquids with energy conservation strategies.    Assessment / Plan /  Recommendation  Clinical Impression  Pt verbalizes improvement in swallowing since getting IVIG yesterday. She is observed with am meal, tentatively eating an english muffin and eggs. She says prior to admission she was feeling that solid food was too dry and wouldnt go down and she was only eating soft carbs and sweets. Pt did not report dysphagia to her caregivers. Discussed with daughter who confirmed some cognitive decline that would account for pts decreased awareness and decision making. She has had neurocognitive testing that has shown decline with recommendation for supervision with finances and no driving. Pt currently also  demonstrates dysarthria and word finding impairment, which is likely secondary to recovery from DKA. While swallowing is under control right now and adequate for meals if pt not fatigued, pt would benefit from full supervision at d/c for all ADL's particularly food choices and new diabetes medication management. She will need f/u at next level of care to address cogntiion, speech and language. Daughter aware. SLP Visit Diagnosis: Cognitive communication deficit (R41.841);Dysphagia, unspecified (R13.10)    Aspiration Risk  Mild aspiration risk;Risk for inadequate nutrition/hydration    Diet Recommendation Regular;Thin liquid   Liquid Administration via: Cup;Straw Medication Administration: Whole meds with puree Supervision: Patient able to self feed Compensations: Follow solids with liquid Postural Changes: Seated upright at 90 degrees    Other  Recommendations      Recommendations for follow up therapy are one component of a multi-disciplinary discharge planning process, led by the attending physician.  Recommendations may be updated based on patient status, additional functional criteria and insurance authorization.  Follow up Recommendations Skilled nursing-short term rehab (<3 hours/day)      Assistance Recommended at Discharge    Functional Status Assessment    Frequency and Duration            Prognosis        Swallow Study   General HPI: TEMILOLA HAUTALA is a 82 y.o. female currently residing at ALF who initially presented in DKA with generalized weakness, slurred speech, confusion.  Has a history of MG and patient has been having progressive dysphagia issues with her myasthenia gravis which made it difficult for her to eat anything other than ice cream and soft fruit.  Unfortunately, this caused her blood sugar to become very high and she went into DKA.  She has been treated for this but states that she is having increased weakness of the lower extremities as well as diplopia  with sustained upgaze and lateral eye movement.  She has been receiving IVIG as an outpatient 1 g/kg and got her last dose last Wednesday. Pt has been seen by SLP in prior visits, has been able to tolerate regualr diet and thin liquids with energy conservation strategies. Behavior/Cognition: Alert;Confused Oral Cavity Assessment: Within Functional Limits Oral Care Completed by SLP: No Oral Cavity - Dentition: Adequate natural dentition Vision: Functional for self-feeding Self-Feeding Abilities: Able to feed self Patient Positioning: Upright in bed Baseline Vocal Quality: Normal Volitional Cough: Strong Volitional Swallow: Able to elicit    Oral/Motor/Sensory Function Overall Oral Motor/Sensory Function: Within functional limits   Ice Chips     Thin Liquid Thin Liquid: Within functional limits    Nectar Thick Nectar Thick Liquid: Not tested   Honey Thick Honey Thick Liquid: Not tested   Puree Puree: Within functional limits   Solid     Solid: Within functional limits      Rydge [place], Riley Nearing 02/25/2023,12:13 PM

## 2023-02-25 NOTE — Progress Notes (Signed)
PROGRESS NOTE    Heidi [name]  ZOX:[id number] DOB: 10-Dec-1940 DOA: 02/21/2023 PCP: Uvaldo Bristle, NP  Outpatient Specialists:     Brief Narrative:  Patient is an 82 year old female with past medical history significant for type 2 diabetes mellitus, coronary artery disease, atrial fibrillation but not anticoagulated, myasthenia gravis, hypertension, depression, and OSA on CPAP.  Patient was admitted with DKA.  Apparently, patient has been on metformin, but patient stopped checking blood sugar about a year ago.  On presentation to the hospital, blood sugar was 737 with anion gap of 23. She was admitted for further workup, quired insulin drip initially, she was transitioned to long-acting insulin, she will need long-acting insulin at time of discharge. -Patient with known history of myasthenia gravis, reported progressive weakness, seen by neurology and treated with IVIG for Icenia gravis flare.     Assessment & Plan:   Principal Problem:   DKA (diabetic ketoacidosis) (HCC) Active Problems:   Myasthenia gravis (HCC)   A-fib (HCC)   HTN (hypertension)   Hypothyroidism   CAD (coronary artery disease)   Chronic diastolic CHF (congestive heart failure) (HCC)   AKI (acute kidney injury) (HCC)   Diabetes mellitus, type II, non-insulin-dependent with DKA  -History of diabetes mellitus, not requiring insulin, presents with DKA, most likely due to steroids and diet indiscretion. -Anion gap has closed, transition to Semglee and insulin sliding scale, discussed with staff to educate her about how to inject insulin as likely she will need at time of discharge -CBG much improved, continue with current dose Semglee  Myasthenia gravis flare -Patient reports progressive weakness, she appears to be significantly deconditioned and weak on physical exam, neurology has been consulted. -Continue with home dose prednisone 20 mg oral daily. -I will hold her Toprol-XL dose till seen by neurology to rule  out crisis. -NIF is -40.  - Continue Mestinon and prednisone  -GI input appreciated, she received IVIG yesterday, will await further recommendations -Avoid medication which contributed to myasthenia gravis flare, beta-blocker is on hold, and will hold on repleting her magnesium despite hypomagnesemia  Hypokalemia - Repleting   hypomagnesemia- Will hold on replacing magnesium due to myasthenia gravis flare  AKI   - SCr is 1.61 on admission, up from 0.76 in February  - Hold diuretics, continue IVF hydration, repeat chem panel 02/22/2023: AKI has resolved.   CAD  - No angina  - Continue ASA and statin     Depression, anxiety, dementia  - Continue galantamine, nortriptyline, and hydroxyzine, use delirium precautions     Chronic diastolic CHF  -Compensated.    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 Graciela Husbands, on Toprol-XL, as well has pacemake. - Toprol-XL on hold given myasthenia gravis flare.     DVT prophylaxis: SCD.Lovenox Code Status: Full code. Family Communication: None at bedside, D/W daughter by phone 5/20 Disposition Plan: Will need SNF placement once cleared by neurology   Consultants:  Neurology.  Procedures:  None.  Antimicrobials:  None.   Subjective:  Significant events overnight, reports she feels weakness has improved, still reports diplopia.  Objective: Vitals:   02/25/23 0015 02/25/23 0328 02/25/23 0735 02/25/23 1149  BP: 123/67 123/69 133/66 (!) 144/78  Pulse: (!) 104 (!) 106 (!) 102 (!) 115  Resp: 17 19 16 19   Temp: 98.6 F (37 C) 98.4 F (36.9 C) (!) 97.4 F (36.3 C) 98.2 F (36.8 C)  TempSrc: Axillary Oral Oral Oral  SpO2: 96% 91% 97% 97%  Weight:      Height:        Intake/Output Summary (Last 24 hours) at 02/25/2023 1355 Last data filed  at 02/24/2023 2300 Gross per 24 hour  Intake 120 ml  Output 1150 ml  Net -1030 ml   Filed Weights   02/21/23 2125  Weight: 66.2 kg    Examination:  Awake Alert, Oriented X 3, No new F.N deficits, Normal affect Symmetrical Chest wall movement, Good air movement bilaterally, CTAB RRR,No Gallops,Rubs or new Murmurs, No Parasternal Heave +ve B.Sounds, Abd Soft, No tenderness, No rebound - guarding or rigidity. No Cyanosis, Clubbing or edema, No new Rash or bruise     Data Reviewed: I have personally reviewed following labs and imaging studies  CBC: Recent Labs  Lab 02/21/23 1859 02/21/23 1937 02/23/23 0346  WBC 15.3*  --  11.5*  NEUTROABS  --   --  5.8  HGB 14.3 13.9 11.8*  HCT 41.3 41.0 33.6*  MCV 88.2  --  91.3  PLT 389  --  189   Basic Metabolic Panel: Recent Labs  Lab 02/22/23 0826 02/22/23 1405 02/23/23 0348 02/24/23 0451 02/24/23 1135 02/25/23 0455  NA 137 135 137 132*  --  135  K 3.7 4.3 3.2* 3.0*  --  3.1*  CL 104 100 104 100  --  104  CO2 24 24 24 24   --  23  GLUCOSE 186* 342* 96 134*  --  131*  BUN 18 15 11 9   --  12  CREATININE 0.88 0.82 0.75 0.60  --  0.80  CALCIUM 9.7 9.4 9.1 8.9  --  9.1  MG  --   --  1.7  --  1.4* 1.3*  PHOS  --   --  1.7* 3.2  --  3.2   GFR: Estimated Creatinine Clearance: 48.1 mL/min (by C-G formula based on SCr of 0.8 mg/dL). Liver Function Tests: Recent Labs  Lab 02/23/23 0348  ALBUMIN 2.5*   No results for input(s): "LIPASE", "AMYLASE" in the last 168 hours. No results for input(s): "AMMONIA" in the last 168 hours. Coagulation Profile: No results for input(s): "INR", "PROTIME" in the last 168 hours. Cardiac Enzymes: No results for input(s): "CKTOTAL", "CKMB", "CKMBINDEX", "TROPONINI" in the last 168 hours. BNP (last 3 results) No results for input(s): "PROBNP" in the last 8760 hours. HbA1C: No results for input(s): "HGBA1C" in the last 72 hours. CBG: Recent Labs  Lab 02/24/23 2000 02/24/23 2337 02/25/23 0327  02/25/23 0734 02/25/23 1148  GLUCAP 221* 117* 104* 108* 194*   Lipid Profile: No results for input(s): "CHOL", "HDL", "LDLCALC", "TRIG", "CHOLHDL", "LDLDIRECT" in the last 72 hours. Thyroid Function Tests: No results for input(s): "TSH", "T4TOTAL", "FREET4", "T3FREE", "THYROIDAB" in the last 72 hours. Anemia Panel: No results for input(s): "VITAMINB12", "FOLATE", "FERRITIN", "TIBC", "IRON", "RETICCTPCT" in the last 72 hours. Urine analysis:    Component Value Date/Time   COLORURINE YELLOW 02/21/2023 2015   APPEARANCEUR CLEAR 02/21/2023 2015   LABSPEC 1.030 02/21/2023 2015   PHURINE 5.0 02/21/2023 2015   GLUCOSEU >=500 (A) 02/21/2023 2015   HGBUR NEGATIVE 02/21/2023 2015   BILIRUBINUR NEGATIVE 02/21/2023 2015   KETONESUR 20 (A) 02/21/2023 2015   PROTEINUR 30 (A) 02/21/2023 2015   UROBILINOGEN 1.0 10/17/2010 1558   NITRITE  NEGATIVE 02/21/2023 2015   LEUKOCYTESUR NEGATIVE 02/21/2023 2015   Sepsis Labs: @LABRCNTIP (procalcitonin:4,lacticidven:4)  ) Recent Results (from the past 240 hour(s))  Resp panel by RT-PCR (RSV, Flu A&B, Covid) Anterior Nasal Swab     Status: None   Collection Time: 02/21/23  8:36 PM   Specimen: Anterior Nasal Swab  Result Value Ref Range Status   SARS Coronavirus 2 by RT PCR NEGATIVE NEGATIVE Final   Influenza A by PCR NEGATIVE NEGATIVE Final   Influenza B by PCR NEGATIVE NEGATIVE Final    Comment: (NOTE) The Xpert Xpress SARS-CoV-2/FLU/RSV plus assay is intended as an aid in the diagnosis of influenza from Nasopharyngeal swab specimens and should not be used as a sole basis for treatment. Nasal washings and aspirates are unacceptable for Xpert Xpress SARS-CoV-2/FLU/RSV testing.  Fact Sheet for Patients: BloggerCourse.com  Fact Sheet for Healthcare Providers: SeriousBroker.it  This test is not yet approved or cleared by the [place] FDA and has been authorized for detection and/or diagnosis  of SARS-CoV-2 by FDA under an Emergency Use Authorization (EUA). This EUA will remain in effect (meaning this test can be used) for the duration of the COVID-19 declaration under Section 564(b)(1) of the Act, 21 U.S.C. section 360bbb-3(b)(1), unless the authorization is terminated or revoked.     Resp Syncytial Virus by PCR NEGATIVE NEGATIVE Final    Comment: (NOTE) Fact Sheet for Patients: BloggerCourse.com  Fact Sheet for Healthcare Providers: SeriousBroker.it  This test is not yet approved or cleared by the [place] FDA and has been authorized for detection and/or diagnosis of SARS-CoV-2 by FDA under an Emergency Use Authorization (EUA). This EUA will remain in effect (meaning this test can be used) for the duration of the COVID-19 declaration under Section 564(b)(1) of the Act, 21 U.S.C. section 360bbb-3(b)(1), unless the authorization is terminated or revoked.  Performed at [facility], [address] [place], [place] [address]          Radiology Studies: No results found.      Scheduled Meds:  aspirin  81 mg Oral Daily   enoxaparin (LOVENOX) injection  40 mg Subcutaneous Q24H   gabapentin  300 mg Oral TID   galantamine  8 mg Oral Daily   hydrocortisone  25 mg Rectal BID   hydrOXYzine  25 mg Oral QHS   insulin aspart  0-15 Units Subcutaneous Q4H   insulin glargine-yfgn  24 Units Subcutaneous QHS   levothyroxine  75 mcg Oral Q0600   melatonin  3 mg Oral QHS   metFORMIN  1,000 mg Oral BID WC   nortriptyline  100 mg Oral QHS   pantoprazole  40 mg Oral BID   potassium chloride  40 mEq Oral Q6H   predniSONE  20 mg Oral Q breakfast   pyridostigmine  30 mg Oral BID   Continuous Infusions:  lactated ringers Stopped (02/22/23 0024)   potassium chloride       LOS: 4 days        Huey Bienenstock , MD  [facility] 7PM-7AM contact night coverage as above

## 2023-02-25 NOTE — H&P (Signed)
Pt with great effort & technique did -35 NIF

## 2023-02-25 NOTE — Progress Notes (Addendum)
Patient did NIF of -30 with good effort. Pt does not want a CPAP at this time.

## 2023-02-25 NOTE — Progress Notes (Signed)
Neurology Progress Note  Brief HPI: 82 year old patient with history of diabetes, CAD, A-fib not on anticoagulation, myasthenia gravis, hypertension, depression and sleep apnea initially presented in DKA with generalized weakness.  DKA has resolved, but patient is having increased myasthenia gravis to include diplopia and weakness of the lower extremities.  She continues to have low diplopia with lateral movement and with sustained upgaze.  She is receiving outpatient IVIG of 1 g/kg every month and that the last dose on 5/15.  An additional dose of 1 g/kg was given to her on 5/20.  Subjective: Patient reports feeling some improvement in her symptoms since getting her IVIG yesterday.  However, she has not been out of bed yet and really does not know how her lower extremity strength is today.  Exam: Vitals:   02/25/23 0328 02/25/23 0735  BP: 123/69 133/66  Pulse: (!) 106 (!) 102  Resp: 19 16  Temp: 98.4 F (36.9 C) (!) 97.4 F (36.3 C)  SpO2: 91% 97%   Gen: In bed, NAD Resp: non-labored breathing, no acute distress Abd: soft, nt  Neuro: Mental Status: Alert and oriented to person place time and situation Cranial Nerves: Pupils equal round and reactive, extraocular movements intact, facial sensation symmetrical, face symmetrical, phonation normal, hearing intact to voice, shoulder shrug symmetrical, tongue midline Motor: 5 out of 5 neck extension and neck flexion, 5 out of 5 bilateral upper extremity strength, proximal and distal, 4 out of 5 hip flexor, 4 out of 5 knee extension and knee flexion, 4+ out of 5 dorsiflexion and plantarflexion Sensory: Intact to light touch throughout Gait: Deferred  Able to count to 27 on a single breath Diplopia continues with lateral eye movement and with sustained upgaze after 20 seconds.  Pertinent Labs:    Latest Ref Rng & Units 02/23/2023    3:46 AM 02/21/2023    7:37 PM 02/21/2023    6:59 PM  CBC  WBC 4.0 - 10.5 K/uL 11.5   15.3   Hemoglobin  12.0 - 15.0 g/dL 16.1  09.6  04.5   Hematocrit 36.0 - 46.0 % 33.6  41.0  41.3   Platelets 150 - 400 K/uL 189   389        Latest Ref Rng & Units 02/25/2023    4:55 AM 02/24/2023    4:51 AM 02/23/2023    3:48 AM  BMP  Glucose 70 - 99 mg/dL 409  811  96   BUN 8 - 23 mg/dL 12  9  11    Creatinine 0.44 - 1.00 mg/dL 9.14  7.82  9.56   Sodium 135 - 145 mmol/L 135  132  137   Potassium 3.5 - 5.1 mmol/L 3.1  3.0  3.2   Chloride 98 - 111 mmol/L 104  100  104   CO2 22 - 32 mmol/L 23  24  24    Calcium 8.9 - 10.3 mg/dL 9.1  8.9  9.1      Imaging Reviewed:  No relevant imaging this admission  Assessment: 82 year old patient with the above past medical history was admitted with generalized weakness and hyperglycemia, she has been treated for DKA.  Over the past 2 weeks, her symptoms of leg weakness and diplopia have worsened despite maintenance dose of 1 g/kg IVIG.  Additional dose of 1 g/kg IVIG was given to her on 5/20, and she does verbalize some and objective improvement.  NIF is -35, patient can count to 27 on a single breath and has good neck strength,  so continuing not to be worried about respiratory status.  Patient has had SLP evaluation, awaiting recommendations for diet.  Will continue PT/OT.  Discussed possibility of increasing Mestinon to 3 times daily with patient, she is not sure at this point if this is would be helpful but will think about it and let us know.  Impression: Myasthenia gravis flare  Recommendations: -Continue Mestinon 30 mg twice daily -Continue prednisone 20 mg daily -Continue PT/OT/SLP -Continue NIF and VC every 8 hours - Recommend elective intubation for respiratory compromise if VC falls below 15 to 20 mL/kg and or NIFs falls below -20cm/H2O. If poor effort and not sure, can always get ABG to assess for CO2 retention. Elective intubation for airway cmopromise if she has difficulty clearing her secretions. Oxygen saturation should not be used to make decision  regarding intubation. - Medications that may worsen or trigger MG exacerbation: Class IA antiarrhythmics, magnesium, flouroquinolones, macrolides, aminoglycosides, penicillamine, curare, interferon alpha, botox, quinine. Use with caution: calcium channel blocker, beta blockers and statins. -Neurology will continue to follow and will see patient again in 2 days to evaluate progress   Cortney E Ernestina Columbia , MSN, AGACNP-BC [facility] See Amion for schedule and pager information 02/25/2023 10:02 AM    Attending Neurohospitalist Addendum Patient seen and examined with APP/Resident. Agree with the history and physical as documented above. Agree with the plan as documented, which I helped formulate. I have edited the note above to reflect my full findings and recommendations. I have independently reviewed the chart, obtained history, review of systems and examined the patient.I have personally reviewed pertinent head/neck/spine imaging (CT/MRI). Please feel free to call with any questions.  -- Bing Neighbors, MD [facility] [phone]  If 7pm- 7am, please page neurology on call as listed in AMION.

## 2023-02-26 DIAGNOSIS — G7000 Myasthenia gravis without (acute) exacerbation: Secondary | ICD-10-CM | POA: Diagnosis not present

## 2023-02-26 DIAGNOSIS — I10 Essential (primary) hypertension: Secondary | ICD-10-CM | POA: Diagnosis not present

## 2023-02-26 DIAGNOSIS — I480 Paroxysmal atrial fibrillation: Secondary | ICD-10-CM | POA: Diagnosis not present

## 2023-02-26 DIAGNOSIS — E1110 Type 2 diabetes mellitus with ketoacidosis without coma: Secondary | ICD-10-CM | POA: Diagnosis not present

## 2023-02-26 LAB — BASIC METABOLIC PANEL
Anion gap: 7 (ref 5–15)
BUN: 8 mg/dL (ref 8–23)
CO2: 23 mmol/L (ref 22–32)
Calcium: 9.5 mg/dL (ref 8.9–10.3)
Chloride: 105 mmol/L (ref 98–111)
Creatinine, Ser: 0.78 mg/dL (ref 0.44–1.00)
GFR, Estimated: >60 mL/min (ref >60)
Glucose, Bld: 69 mg/dL — ABNORMAL LOW (ref 70–99)
Potassium: 3.9 mmol/L (ref 3.5–5.1)
Sodium: 135 mmol/L (ref 135–145)

## 2023-02-26 LAB — GLUCOSE, CAPILLARY
Glucose-Capillary: 71 mg/dL (ref 70–99)
Glucose-Capillary: 86 mg/dL (ref 70–99)
Glucose-Capillary: 152 mg/dL — ABNORMAL HIGH (ref 70–99)
Glucose-Capillary: 193 mg/dL — ABNORMAL HIGH (ref 70–99)
Glucose-Capillary: 181 mg/dL — ABNORMAL HIGH (ref 70–99)

## 2023-02-26 MED ORDER — CHOLESTYRAMINE LIGHT 4 G PO PACK
4.0000 g | PACK | Freq: Two times a day (BID) | ORAL | Status: DC
  Administered 2023-02-26 – 2023-02-27 (×3): 4 g via ORAL
  Filled 2023-02-26 (×4): qty 1

## 2023-02-26 MED ORDER — INSULIN GLARGINE-YFGN 100 UNIT/ML ~~LOC~~ SOLN
20.0000 [IU] | Freq: Every day | SUBCUTANEOUS | Status: DC
  Administered 2023-02-26: 20 [IU] via SUBCUTANEOUS
  Filled 2023-02-26 (×2): qty 0.2

## 2023-02-26 MED ORDER — LEVOTHYROXINE SODIUM 88 MCG PO TABS
88.0000 ug | ORAL_TABLET | Freq: Every day | ORAL | Status: DC
  Administered 2023-02-27: 88 ug via ORAL
  Filled 2023-02-26: qty 1

## 2023-02-26 NOTE — Progress Notes (Signed)
PROGRESS NOTE        PATIENT DETAILS Name: Heidi [name] Age: 82 y.o. Sex: female Date of Birth: 04-03-1941 Admit Date: 02/21/2023 Admitting Physician Briscoe Deutscher, MD ZOX:[id number], Nedra Hai, NP  Brief Summary: Patient is a 82 y.o.  female with history of DM-2, HTN, PAF, myasthenia gravis-who presented with DKA-she continued to have weakness even after DKA resolved and developed diplopia -and thought to have myasthenia gravis flare.  Significant events: 5/17>> admit to [facility]  Significant studies: 5/17>> CXR: No active disease  Significant microbiology data: 5/17>> COVID/influenza/RSV PCR: Negative  Procedures: None  Consults: Neurology  Subjective: Lying comfortably in bed-denies any chest pain or shortness of breath.  Feels the weakness is better-continues to have some degree of diplopia on extreme right/left gaze.  Objective: Vitals: Blood pressure 125/67, pulse 98, temperature 98.3 F (36.8 C), temperature source Oral, resp. rate 18, height 5\' 1"  (1.549 m), weight 66.2 kg, SpO2 95 %.   Exam: Gen Exam:Alert awake-not in any distress HEENT:atraumatic, normocephalic Chest: B/L clear to auscultation anteriorly CVS:S1S2 regular Abdomen:soft non tender, non distended Extremities:no edema Neurology: Non focal Skin: no rash  Pertinent Labs/Radiology:    Latest Ref Rng & Units 02/23/2023    3:46 AM 02/21/2023    7:37 PM 02/21/2023    6:59 PM  CBC  WBC 4.0 - 10.5 K/uL 11.5   15.3   Hemoglobin 12.0 - 15.0 g/dL 40.9  81.1  91.4   Hematocrit 36.0 - 46.0 % 33.6  41.0  41.3   Platelets 150 - 400 K/uL 189   389     Lab Results  Component Value Date   NA 135 02/26/2023   K 3.9 02/26/2023   CL 105 02/26/2023   CO2 23 02/26/2023      Assessment/Plan: DKA Resolved with IVF/IV insulin  DM-2 with uncontrolled hyperglycemia (A1c 7.4 on 2/9) CBGs relatively stable with Semglee 24 units daily and SSI  Recent Labs    02/25/23 2301 02/26/23 0428  02/26/23 0808  GLUCAP 177* 71 86   Myasthenia gravis flare Overall improved-still with some diplopia at extreme right/left gaze Given IVIG 5/20 Currently on prednisone/Mestinon Await further recommendations from neurology service  AKI Likely hemodynamically/osmotic kidney injury in the setting of DKA Resolved  CAD No anginal symptoms Aspirin/statin  Chronic HFpEF Compensated  PAF Sinus rhythm Per prior notes-not anticoagulated-as she apparently had 1 episode during a prior hospitalization.  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  Hypothyroidism Synthroid  Mood disorder-anxiety/depression Dementia Appears stable Continue galantamine/nortriptyline/hydroxyzine Delirium precautions  OSA CPAP nightly  BMI: Estimated body mass index is 27.58 kg/m as calculated from the following:   Height as of this encounter: 5\' 1"  (1.549 m).   Weight as of this encounter: 66.2 kg.   Code status:   Code Status: Full Code   DVT Prophylaxis: enoxaparin (LOVENOX) injection 40 mg Start: 02/24/23 1130 Place and maintain sequential compression device Start: 02/22/23 1908 SCDs Start: 02/21/23 2318   Family Communication: None at bedside   Disposition Plan: Status is: Inpatient Remains inpatient appropriate because: Severity of illness   Planned Discharge Destination:Skilled nursing facility   Diet: Diet Order             Diet heart healthy/carb modified Room service appropriate? Yes with Assist; Fluid consistency: Thin  Diet effective now  Antimicrobial agents: Anti-infectives (From admission, onward)    None        MEDICATIONS: Scheduled Meds:  aspirin  81 mg Oral Daily   enoxaparin (LOVENOX) injection  40 mg Subcutaneous Q24H   gabapentin  300 mg Oral TID   galantamine  8 mg Oral Daily   hydrocortisone  25 mg Rectal BID   hydrOXYzine  25 mg Oral QHS   insulin aspart  0-15 Units  Subcutaneous Q4H   insulin glargine-yfgn  24 Units Subcutaneous QHS   levothyroxine  75 mcg Oral Q0600   melatonin  3 mg Oral QHS   metFORMIN  1,000 mg Oral BID WC   nortriptyline  100 mg Oral QHS   pantoprazole  40 mg Oral BID   predniSONE  20 mg Oral Q breakfast   pyridostigmine  30 mg Oral BID   Continuous Infusions:  lactated ringers Stopped (02/22/23 0024)   PRN Meds:.dextrose   I have personally reviewed following labs and imaging studies  LABORATORY DATA: CBC: Recent Labs  Lab 02/21/23 1859 02/21/23 1937 02/23/23 0346  WBC 15.3*  --  11.5*  NEUTROABS  --   --  5.8  HGB 14.3 13.9 11.8*  HCT 41.3 41.0 33.6*  MCV 88.2  --  91.3  PLT 389  --  189    Basic Metabolic Panel: Recent Labs  Lab 02/22/23 1405 02/23/23 0348 02/24/23 0451 02/24/23 1135 02/25/23 0455 02/26/23 0403  NA 135 137 132*  --  135 135  K 4.3 3.2* 3.0*  --  3.1* 3.9  CL 100 104 100  --  104 105  CO2 24 24 24   --  23 23  GLUCOSE 342* 96 134*  --  131* 69*  BUN 15 11 9   --  12 8  CREATININE 0.82 0.75 0.60  --  0.80 0.78  CALCIUM 9.4 9.1 8.9  --  9.1 9.5  MG  --  1.7  --  1.4* 1.3*  --   PHOS  --  1.7* 3.2  --  3.2  --     GFR: Estimated Creatinine Clearance: 48.1 mL/min (by C-G formula based on SCr of 0.78 mg/dL).  Liver Function Tests: Recent Labs  Lab 02/23/23 0348  ALBUMIN 2.5*   No results for input(s): "LIPASE", "AMYLASE" in the last 168 hours. No results for input(s): "AMMONIA" in the last 168 hours.  Coagulation Profile: No results for input(s): "INR", "PROTIME" in the last 168 hours.  Cardiac Enzymes: No results for input(s): "CKTOTAL", "CKMB", "CKMBINDEX", "TROPONINI" in the last 168 hours.  BNP (last 3 results) No results for input(s): "PROBNP" in the last 8760 hours.  Lipid Profile: No results for input(s): "CHOL", "HDL", "LDLCALC", "TRIG", "CHOLHDL", "LDLDIRECT" in the last 72 hours.  Thyroid Function Tests: No results for input(s): "TSH", "T4TOTAL", "FREET4",  "T3FREE", "THYROIDAB" in the last 72 hours.  Anemia Panel: No results for input(s): "VITAMINB12", "FOLATE", "FERRITIN", "TIBC", "IRON", "RETICCTPCT" in the last 72 hours.  Urine analysis:    Component Value Date/Time   COLORURINE YELLOW 02/21/2023 2015   APPEARANCEUR CLEAR 02/21/2023 2015   LABSPEC 1.030 02/21/2023 2015   PHURINE 5.0 02/21/2023 2015   GLUCOSEU >=500 (A) 02/21/2023 2015   HGBUR NEGATIVE 02/21/2023 2015   BILIRUBINUR NEGATIVE 02/21/2023 2015   KETONESUR 20 (A) 02/21/2023 2015   PROTEINUR 30 (A) 02/21/2023 2015   UROBILINOGEN 1.0 10/17/2010 1558   NITRITE NEGATIVE 02/21/2023 2015   LEUKOCYTESUR NEGATIVE 02/21/2023 2015    Sepsis Labs: Lactic Acid,  Venous No results found for: "LATICACIDVEN"  MICROBIOLOGY: Recent Results (from the past 240 hour(s))  Resp panel by RT-PCR (RSV, Flu A&B, Covid) Anterior Nasal Swab     Status: None   Collection Time: 02/21/23  8:36 PM   Specimen: Anterior Nasal Swab  Result Value Ref Range Status   SARS Coronavirus 2 by RT PCR NEGATIVE NEGATIVE Final   Influenza A by PCR NEGATIVE NEGATIVE Final   Influenza B by PCR NEGATIVE NEGATIVE Final    Comment: (NOTE) The Xpert Xpress SARS-CoV-2/FLU/RSV plus assay is intended as an aid in the diagnosis of influenza from Nasopharyngeal swab specimens and should not be used as a sole basis for treatment. Nasal washings and aspirates are unacceptable for Xpert Xpress SARS-CoV-2/FLU/RSV testing.  Fact Sheet for Patients: BloggerCourse.com  Fact Sheet for Healthcare Providers: SeriousBroker.it  This test is not yet approved or cleared by the [place] FDA and has been authorized for detection and/or diagnosis of SARS-CoV-2 by FDA under an Emergency Use Authorization (EUA). This EUA will remain in effect (meaning this test can be used) for the duration of the COVID-19 declaration under Section 564(b)(1) of the Act, 21 U.S.C. section  360bbb-3(b)(1), unless the authorization is terminated or revoked.     Resp Syncytial Virus by PCR NEGATIVE NEGATIVE Final    Comment: (NOTE) Fact Sheet for Patients: BloggerCourse.com  Fact Sheet for Healthcare Providers: SeriousBroker.it  This test is not yet approved or cleared by the [place] FDA and has been authorized for detection and/or diagnosis of SARS-CoV-2 by FDA under an Emergency Use Authorization (EUA). This EUA will remain in effect (meaning this test can be used) for the duration of the COVID-19 declaration under Section 564(b)(1) of the Act, 21 U.S.C. section 360bbb-3(b)(1), unless the authorization is terminated or revoked.  Performed at [facility], [address] [place], [place] [address]     RADIOLOGY STUDIES/RESULTS: No results found.   LOS: 5 days   Jeoffrey Massed, MD  [facility]    To contact the attending provider between 7A-7P or the covering provider during after hours 7P-7A, please log into the web site [email or web address] and access using universal [place] password for that web site. If you do not have the password, please call the hospital operator.  02/26/2023, 8:57 AM

## 2023-02-26 NOTE — Progress Notes (Addendum)
Pt did NIF of > -40 with good effort. Pt refused CPAP at this time. She is not in any distress.

## 2023-02-26 NOTE — Progress Notes (Signed)
With good effort pt did -40 NIF

## 2023-02-26 NOTE — Inpatient Diabetes Management (Addendum)
Inpatient Diabetes Program Recommendations  AACE/ADA: New Consensus Statement on Inpatient Glycemic Control (2015)  Target Ranges:  Prepandial:   less than 140 mg/dL      Peak postprandial:   less than 180 mg/dL (1-2 hours)      Critically ill patients:  140 - 180 mg/dL   Lab Results  Component Value Date   GLUCAP 86 02/26/2023   HGBA1C 7.4 (H) 11/15/2022    Review of Glycemic Control  Latest Reference Range & Units 02/25/23 03:27 02/25/23 07:34 02/25/23 11:48 02/25/23 16:18 02/25/23 20:05 02/25/23 23:01 02/26/23 04:28 02/26/23 08:08  Glucose-Capillary 70 - 99 mg/dL 161 (H) 096 (H) 045 (H) 215 (H) 203 (H) 177 (H) 71 86    Latest Reference Range & Units 02/26/23 04:03  Glucose 70 - 99 mg/dL 69 (L)   Diabetes history: DM 2 Outpatient Diabetes medications:  Metformin 500 mg bid Prednisone 20 mg with breakfast Current orders for Inpatient glycemic control:  Novolog 0-15 units q 4 hours Semglee 24 units q HS Metformin 1000 mg bid Prednisone 20 mg daily with breakfast  Inpatient Diabetes Program Recommendations:    Note mild low blood sugar this AM.  Consider reducing Novolog correction to tid with meals (instead of q 4 hours) and HS scale.    Thanks,  Beryl Meager, RN, BC-ADM Inpatient Diabetes Coordinator Pager [phone]  (8a-5p)

## 2023-02-26 NOTE — Progress Notes (Signed)
Physical Therapy Treatment Patient Details Name: Heidi [name] MRN: [id number] DOB: 08-29-1941 Today's Date: 02/26/2023   History of Present Illness Patient is an 82 year old female who came to ED with progressive weakness and blood sugar of 737. Pt admitted with DKA. PMH: type 2 diabetes mellitus, coronary artery disease, atrial fibrillation but not anticoagulated, myasthenia gravis, hypertension, depression, and OSA on CPAP.    PT Comments    Pt much improved from Monday. Pt able to begin gait training this date and amb 52' then 104' with RW and minA with chair follow. Pt continues to shake/tremor with fatigue however demo's improved activity tolerance and ability this date. Pt remains to have frequent BMs and requires assist for hygiene. Acute PT to cont to follow. Remains appropriate for rehab program 2-3 hours/day to achieve safe mod I level of function prior to transition back to ALF apartment.     Recommendations for follow up therapy are one component of a multi-disciplinary discharge planning process, led by the attending physician.  Recommendations may be updated based on patient status, additional functional criteria and insurance authorization.  Follow Up Recommendations  Can patient physically be transported by private vehicle: Yes    Assistance Recommended at Discharge Frequent or constant Supervision/Assistance  Patient can return home with the following A lot of help with walking and/or transfers;A little help with bathing/dressing/bathroom;Direct supervision/assist for medications management;Assist for transportation   Equipment Recommendations  None recommended by PT    Recommendations for Other Services       Precautions / Restrictions Precautions Precautions: Fall Precaution Comments: incontinence/urgency Restrictions Weight Bearing Restrictions: No     Mobility  Bed Mobility Overal bed mobility:  (pt up on BSC this date.)             General bed mobility  comments: pt received up on BSC    Transfers Overall transfer level: Needs assistance Equipment used: Rolling walker (2 wheels) Transfers: Sit to/from Stand, Bed to chair/wheelchair/BSC Sit to Stand: Min assist   Step pivot transfers: Min assist       General transfer comment: minA to power up and stedy for hygiene s/p BM, minA for walker management during turning to transfer back to EOB    Ambulation/Gait Ambulation/Gait assistance: Min assist, +2 safety/equipment (chair follow and line management) Gait Distance (Feet): 30 Feet (x1, 40x1) Assistive device: Rolling walker (2 wheels) Gait Pattern/deviations: Step-through pattern, Decreased stride length Gait velocity: dec Gait velocity interpretation: <1.31 ft/sec, indicative of household ambulator   General Gait Details: pt with increased UE dependence due to bilat LE weakness, pt mildly shaky due to fatigue and haven't walked in a couple of months due to progressive weakness. Pt with one seated rest break, mild SOB   Stairs             Wheelchair Mobility    Modified Rankin (Stroke Patients Only)       Balance Overall balance assessment: Needs assistance Sitting-balance support: Feet supported, No upper extremity supported Sitting balance-Leahy Scale: Good Sitting balance - Comments: sitting in recliner during sock management   Standing balance support: Bilateral upper extremity supported, During functional activity, Reliant on assistive device for balance Standing balance-Leahy Scale: Poor Standing balance comment: reliant on external support                            Cognition Arousal/Alertness: Awake/alert Behavior During Therapy: WFL for tasks assessed/performed Overall Cognitive Status: Within Functional Limits for  tasks assessed                                          Exercises      General Comments General comments (skin integrity, edema, etc.): VSS on RA       Pertinent Vitals/Pain Pain Assessment Pain Assessment: No/denies pain    Home Living                          Prior Function            PT Goals (current goals can now be found in the care plan section) Acute Rehab PT Goals Patient Stated Goal: walk PT Goal Formulation: With patient Time For Goal Achievement: 03/10/23 Potential to Achieve Goals: Good Progress towards PT goals: Progressing toward goals    Frequency    Min 3X/week      PT Plan Current plan remains appropriate    Co-evaluation              AM-PAC PT "6 Clicks" Mobility   Outcome Measure  Help needed turning from your back to your side while in a flat bed without using bedrails?: A Little Help needed moving from lying on your back to sitting on the side of a flat bed without using bedrails?: A Little Help needed moving to and from a bed to a chair (including a wheelchair)?: A Little Help needed standing up from a chair using your arms (e.g., wheelchair or bedside chair)?: A Lot Help needed to walk in hospital room?: A Lot Help needed climbing 3-5 steps with a railing? : A Lot 6 Click Score: 15    End of Session Equipment Utilized During Treatment: Gait belt Activity Tolerance: Patient tolerated treatment well Patient left: in chair;with call bell/phone within reach;with chair alarm set Nurse Communication: Mobility status PT Visit Diagnosis: Unsteadiness on feet (R26.81);Muscle weakness (generalized) (M62.81);Difficulty in walking, not elsewhere classified (R26.2)     Time: 4098-1191 PT Time Calculation (min) (ACUTE ONLY): 19 min  Charges:  $Gait Training: 8-22 mins                     Lewis Shock, PT, DPT Acute Rehabilitation Services Secure chat preferred Office #: [phone]    Iona Hansen 02/26/2023, 11:28 AM

## 2023-02-26 NOTE — TOC Progression Note (Addendum)
Transition of Care [facility]) - Progression Note    Patient Details  Name: KESIA FINNEGAN MRN: [id number] Date of Birth: July 03, 1941  [facility]) CM/SW Contact  Mearl Latin, LCSW Phone Number: 02/26/2023, 1:13 PM  Clinical Narrative:    CSW met with patient who reported agreement with plan to go to rehab at [facility]. She confirmed she does have a CPAP at home but she does not use it and has not used on here. She is requesting PTAR for transport if possible.   CSW contacted [employer] to request insurance authorization for [employer] and PTAR.    Expected Discharge Plan: Skilled Nursing Facility Barriers to Discharge: Continued Medical Work up, English as a second language teacher  Expected Discharge Plan and Services In-house Referral: Clinical Social Work   Post Acute Care Choice: Skilled Nursing Facility Living arrangements for the past 2 months: Single Family Home                                       Social Determinants of Health (SDOH) Interventions SDOH Screenings   Food Insecurity: No Food Insecurity (11/12/2022)  Housing: Low Risk  (11/12/2022)  Transportation Needs: No Transportation Needs (11/12/2022)  Utilities: Not At Risk (11/12/2022)  Tobacco Use: Medium Risk (02/21/2023)    Readmission Risk Interventions     No data to display

## 2023-02-27 DIAGNOSIS — I480 Paroxysmal atrial fibrillation: Secondary | ICD-10-CM | POA: Diagnosis not present

## 2023-02-27 DIAGNOSIS — E039 Hypothyroidism, unspecified: Secondary | ICD-10-CM | POA: Diagnosis not present

## 2023-02-27 DIAGNOSIS — E1110 Type 2 diabetes mellitus with ketoacidosis without coma: Secondary | ICD-10-CM | POA: Diagnosis not present

## 2023-02-27 DIAGNOSIS — G7000 Myasthenia gravis without (acute) exacerbation: Secondary | ICD-10-CM | POA: Diagnosis not present

## 2023-02-27 LAB — GLUCOSE, CAPILLARY
Glucose-Capillary: 103 mg/dL — ABNORMAL HIGH (ref 70–99)
Glucose-Capillary: 110 mg/dL — ABNORMAL HIGH (ref 70–99)
Glucose-Capillary: 131 mg/dL — ABNORMAL HIGH (ref 70–99)
Glucose-Capillary: 205 mg/dL — ABNORMAL HIGH (ref 70–99)
Glucose-Capillary: 81 mg/dL (ref 70–99)
Glucose-Capillary: 91 mg/dL (ref 70–99)

## 2023-02-27 MED ORDER — NOVOLOG FLEXPEN 100 UNIT/ML ~~LOC~~ SOPN: PEN_INJECTOR | SUBCUTANEOUS | 0 refills | Status: AC

## 2023-02-27 MED ORDER — INSULIN GLARGINE-YFGN 100 UNIT/ML ~~LOC~~ SOLN: 20.0000 [IU] | Freq: Every day | SUBCUTANEOUS | 11 refills | Status: AC

## 2023-02-27 NOTE — Progress Notes (Signed)
NIF: Greater than -40 with excellent patient effort.

## 2023-02-27 NOTE — Progress Notes (Signed)
Physical Therapy Treatment Patient Details Name: Heidi [name] MRN: [id number] DOB: 11-11-1940 Today's Date: 02/27/2023   History of Present Illness Patient is an 82 year old female who came to ED with progressive weakness and blood sugar of 737. Pt admitted with DKA. PMH: type 2 diabetes mellitus, coronary artery disease, atrial fibrillation but not anticoagulated, myasthenia gravis, hypertension, depression, and OSA on CPAP.    PT Comments    Patient is agreeable to PT. She was seated in the chair on arrival to room. Patient required minimal assistance for short distance ambulation using rolling walker in the room. Steadying assistance required as well as intermittent assistance needed for rolling walker negotiation. Patient is fatigued with minimal standing activity with feeling of increasing right leg weakness which happens with her myasthenia gravis per her report. Recommend to continue PT to maximize independence and facilitate return to prior level of function. Anticipate the need initially for frequent assistance at discharge with continued PT recommended after this hospital discharge.    Recommendations for follow up therapy are one component of a multi-disciplinary discharge planning process, led by the attending physician.  Recommendations may be updated based on patient status, additional functional criteria and insurance authorization.  Follow Up Recommendations  Can patient physically be transported by private vehicle: No    Assistance Recommended at Discharge Frequent or constant Supervision/Assistance  Patient can return home with the following A lot of help with walking and/or transfers;A little help with bathing/dressing/bathroom;Direct supervision/assist for medications management;Assist for transportation   Equipment Recommendations  None recommended by PT    Recommendations for Other Services       Precautions / Restrictions Precautions Precautions: Fall Precaution  Comments: incontinence/urgency Restrictions Weight Bearing Restrictions: No     Mobility  Bed Mobility               General bed mobility comments: patient seated in the chair on arrival, bed mobility not assessed    Transfers Overall transfer level: Needs assistance Equipment used: Rolling walker (2 wheels) Transfers: Sit to/from Stand Sit to Stand: Supervision           General transfer comment: reinforcement of hand placement for safety    Ambulation/Gait Ambulation/Gait assistance: Min assist Gait Distance (Feet): 30 Feet Assistive device: Rolling walker (2 wheels) Gait Pattern/deviations: Step-through pattern, Narrow base of support Gait velocity: decreased     General Gait Details: steadying assistance provided with intermittent assistance for rolling walker negotiation. patient reports right leg fatigue with minimal ambulation. unable to progress ambulation further at this time due to fatigue.   Stairs             Wheelchair Mobility    Modified Rankin (Stroke Patients Only)       Balance Overall balance assessment: Needs assistance Sitting-balance support: Feet supported, No upper extremity supported Sitting balance-Leahy Scale: Good     Standing balance support: Single extremity supported, No upper extremity supported, During functional activity, Reliant on assistive device for balance Standing balance-Leahy Scale: Poor Standing balance comment: relying on rolling walker for support in standing during dynamic activity                            Cognition Arousal/Alertness: Awake/alert Behavior During Therapy: WFL for tasks assessed/performed Overall Cognitive Status: Within Functional Limits for tasks assessed  Exercises      General Comments General comments (skin integrity, edema, etc.): Pt with one episode of O2 sat decrease to 88% on RA in standing with quick  recovery to 100% on RA utilizing pursed lip breathing technique. All other VSS throughout session.      Pertinent Vitals/Pain Pain Assessment Pain Assessment: No/denies pain    Home Living                          Prior Function            PT Goals (current goals can now be found in the care plan section) Acute Rehab PT Goals Patient Stated Goal: to get to rehab PT Goal Formulation: With patient Time For Goal Achievement: 03/10/23 Potential to Achieve Goals: Good Progress towards PT goals: Progressing toward goals    Frequency    Min 3X/week      PT Plan Current plan remains appropriate    Co-evaluation              AM-PAC PT "6 Clicks" Mobility   Outcome Measure  Help needed turning from your back to your side while in a flat bed without using bedrails?: A Little Help needed moving from lying on your back to sitting on the side of a flat bed without using bedrails?: A Little Help needed moving to and from a bed to a chair (including a wheelchair)?: A Little Help needed standing up from a chair using your arms (e.g., wheelchair or bedside chair)?: A Lot Help needed to walk in hospital room?: A Lot Help needed climbing 3-5 steps with a railing? : A Lot 6 Click Score: 15    End of Session Equipment Utilized During Treatment: Gait belt Activity Tolerance: Patient tolerated treatment well Patient left: in chair;with call bell/phone within reach;with chair alarm set Nurse Communication: Mobility status PT Visit Diagnosis: Unsteadiness on feet (R26.81);Muscle weakness (generalized) (M62.81);Difficulty in walking, not elsewhere classified (R26.2)     Time: 1610-9604 PT Time Calculation (min) (ACUTE ONLY): 11 min  Charges:  $Therapeutic Activity: 8-22 mins                     Donna Bernard, PT, MPT    [name] 02/27/2023, 2:44 PM

## 2023-02-27 NOTE — TOC Progression Note (Addendum)
Transition of Care [facility]) - Progression Note    Patient Details  Name: Heidi [name] MRN: [id number] Date of Birth: 1941-07-29  [facility]) CM/SW Contact  Mearl Latin, LCSW Phone Number: 02/27/2023, 11:08 AM  Clinical Narrative:    11am-CSW received a SNF denial and a denial for PTAR from [facility]. Peer to Peer offered with Dr. Logan Bores [phone]. MD given info and will complete Peer to Peer.   2pm-CSW notified that [employer] has approved Eligha Bridegroom for 7 days, Auth # [id number], and Theodoro Grist # [id number]. Patient's daughter reached out to CSW and is aware of discharge today.    Expected Discharge Plan: Skilled Nursing Facility Barriers to Discharge: Continued Medical Work up, English as a second language teacher  Expected Discharge Plan and Services In-house Referral: Clinical Social Work   Post Acute Care Choice: Skilled Nursing Facility Living arrangements for the past 2 months: Single Family Home Expected Discharge Date: 02/27/23                                     Social Determinants of Health (SDOH) Interventions SDOH Screenings   Food Insecurity: No Food Insecurity (11/12/2022)  Housing: Low Risk  (11/12/2022)  Transportation Needs: No Transportation Needs (11/12/2022)  Utilities: Not At Risk (11/12/2022)  Tobacco Use: Medium Risk (02/21/2023)    Readmission Risk Interventions     No data to display

## 2023-02-27 NOTE — Progress Notes (Signed)
Occupational Therapy Treatment Patient Details Name: Heidi [name] MRN: [id number] DOB: 12-11-1940 Today's Date: 02/27/2023   History of present illness Patient is an 82 year old female who came to ED with progressive weakness and blood sugar of 737. Pt admitted with DKA. PMH: type 2 diabetes mellitus, coronary artery disease, atrial fibrillation but not anticoagulated, myasthenia gravis, hypertension, depression, and OSA on CPAP.   OT comments  Pt supine in bed with nurse tech present upon OT arrival. Pt agreeable to participation in skilled OT session. OT instructed pt in techniques for increased safety and independence with bed mobility, ADLs, and functional transfers/mobility with a RW (2 wheel). Pt demonstrates ability to complete UB ADLs with Set up in sitting to Min guard assist in standing, LB ADLs with Min to Mod assist, and functional transfers and mobility with a RW with Min guard assist and occasional cues for safety. Pt participated well in session and is motivated to continue participation in OT sessions. Pt is making good progress toward OT goals. Pt continues to present with generalized B UE weakness and significantly decreased activity tolerance, which is affecting pt functional level and safety during ADLs and functional transfers and mobility. Pt will benefit from continued acute skilled OT services to address deficits and increase safety and independence with ADLs. Post acute discharge, pt will benefit from intensive inpatient skilled OT services < 3 hours per day to maximize rehab potential, decrease caregiver burden, and increase pt safety and independence with ADLs and functional mobility and transfers.    Recommendations for follow up therapy are one component of a multi-disciplinary discharge planning process, led by the attending physician.  Recommendations may be updated based on patient status, additional functional criteria and insurance authorization.    Assistance  Recommended at Discharge Frequent or constant Supervision/Assistance  Patient can return home with the following  A little help with walking and/or transfers;A little help with bathing/dressing/bathroom;Assistance with cooking/housework;Assist for transportation;Help with stairs or ramp for entrance   Equipment Recommendations  None recommended by OT    Recommendations for Other Services      Precautions / Restrictions Precautions Precautions: Fall Precaution Comments: incontinence/urgency Restrictions Weight Bearing Restrictions: No       Mobility Bed Mobility Overal bed mobility: Needs Assistance Bed Mobility: Supine to Sit, Rolling Rolling: Supervision   Supine to sit: Min guard     General bed mobility comments: requires extra time and occasional cues for technique    Transfers Overall transfer level: Needs assistance Equipment used: Rolling walker (2 wheels) Transfers: Sit to/from Stand, Bed to chair/wheelchair/BSC Sit to Stand: Supervision     Step pivot transfers: Min guard     General transfer comment: requires extra time for sit/stand transfers     Balance Overall balance assessment: Needs assistance Sitting-balance support: Feet supported, No upper extremity supported Sitting balance-Leahy Scale: Good     Standing balance support: Single extremity supported, No upper extremity supported, During functional activity, Reliant on assistive device for balance Standing balance-Leahy Scale: Fair Standing balance comment: Pt required intermittent unilateral UE support to maintain standing balance during grooming tasks.                           ADL either performed or assessed with clinical judgement   ADL Overall ADL's : Needs assistance/impaired Eating/Feeding: Modified independent;Sitting   Grooming: Min guard;Standing;Wash/dry hands;Wash/dry face;Oral care;Brushing hair   Upper Body Bathing: Set up;Sitting   Lower Body Bathing: Minimal  assistance;Moderate  assistance;Sit to/from stand;Cueing for compensatory techniques   Upper Body Dressing : Set up;Sitting   Lower Body Dressing: Minimal assistance;Moderate assistance;Sit to/from stand   Toilet Transfer: Min guard;Rolling walker (2 wheels);BSC/3in1 (ambulating)   Toileting- Architect and Hygiene: Moderate assistance;Cueing for compensatory techniques;Sit to/from stand       Functional mobility during ADLs: Min guard;Rolling walker (2 wheels);Cueing for safety General ADL Comments: Pt stood at sink for ADLs using RW and counter for intermittent unilateral UE support for 9 minutes without a rest break. Pt with occasional SOB and one episode of O2 sat dropping to 88% on RA in standing. OT educated pt in pursed lip breathing with pt demonstrating understanding through teach back and pt O2 sat quickly rising to 100% on RA. Pt noted to indepdently utilize pursed lip breathing later in session during brief episode of SOB with O2 sat remaining >94%.    Extremity/Trunk Assessment Upper Extremity Assessment Upper Extremity Assessment: Generalized weakness            Vision       Perception     Praxis      Cognition Arousal/Alertness: Awake/alert Behavior During Therapy: WFL for tasks assessed/performed Overall Cognitive Status: Within Functional Limits for tasks assessed                                          Exercises      Shoulder Instructions       General Comments Pt with one episode of O2 sat decrease to 88% on RA in standing with quick recovery to 100% on RA utilizing pursed lip breathing technique. All other VSS throughout session.    Pertinent Vitals/ Pain       Pain Assessment Pain Assessment: No/denies pain  Home Living                                          Prior Functioning/Environment              Frequency    Min 2X/week        Progress Toward Goals  OT Goals(current goals can  now be found in the care plan section)  Progress towards OT goals: Progressing toward goals  Acute Rehab OT Goals Patient Stated Goal: To get stronger and have more energy to do things OT Goal Formulation: With patient  Plan Discharge plan remains appropriate    Co-evaluation                 AM-PAC OT "6 Clicks" Daily Activity     Outcome Measure   Help from another person eating meals?: None Help from another person taking care of personal grooming?: A Little Help from another person toileting, which includes using toliet, bedpan, or urinal?: A Lot Help from another person bathing (including washing, rinsing, drying)?: A Lot Help from another person to put on and taking off regular upper body clothing?: A Little Help from another person to put on and taking off regular lower body clothing?: A Lot 6 Click Score: 16    End of Session Equipment Utilized During Treatment: Gait belt;Rolling walker (2 wheels)  OT Visit Diagnosis: Unsteadiness on feet (R26.81);Muscle weakness (generalized) (M62.81);Other (comment) (Decreased activity tolerance)   Activity Tolerance Patient tolerated treatment well   Patient Left  in chair;with call bell/phone within reach;with chair alarm set   Nurse Communication Mobility status        Time: [phone] OT Time Calculation (min): 31 min  Charges: OT General Charges $OT Visit: 1 Visit OT Treatments $Self Care/Home Management : 23-37 mins  Saide Lanuza "Orson Eva., OTR/L, MA Acute Rehab [phone]   Lendon Colonel 02/27/2023, 12:04 PM

## 2023-02-27 NOTE — Discharge Summary (Addendum)
PATIENT DETAILS Name: Heidi [name] Age: 82 y.o. Sex: female Date of Birth: [birth year] MRN: [id number]. Admitting Physician: Briscoe Deutscher, MD WUJ:[id number], Nedra Hai, NP  Admit Date: 02/21/2023 Discharge date: 02/27/2023  Recommendations for Outpatient Follow-up:  Follow up with PCP in 1-2 weeks Please obtain CMP/CBC in one week  Admitted From:  Home  Disposition: Skilled nursing facility   Discharge Condition: good  CODE STATUS:   Code Status: Full Code   Diet recommendation:  Diet Order             Diet - low sodium heart healthy           Diet Carb Modified           Diet heart healthy/carb modified Room service appropriate? Yes with Assist; Fluid consistency: Thin  Diet effective now                    Brief Summary: Patient is a 82 y.o.  female with history of DM-2, HTN, PAF, myasthenia gravis-who presented with DKA-she continued to have weakness even after DKA resolved and developed diplopia -and thought to have myasthenia gravis flare.   Significant events: 5/17>> admit to [facility]   Significant studies: 5/17>> CXR: No active disease   Significant microbiology data: 5/17>> COVID/influenza/RSV PCR: Negative   Procedures: None   Consults: Neurology  Brief Hospital Course: DKA Resolved with IVF/IV insulin   DM-2 with uncontrolled hyperglycemia (A1c 7.4 on 2/9) CBGs relatively stable with Semglee 20 units daily and SSI PCP to optimize regimen  Myasthenia gravis flare Overall improved-still with some diplopia at extreme right/left gaze Given IVIG 5/20 Currently on prednisone/Mestinon Discussed with neurologist-Dr. Selina Cooley on 5/23-no further recommendations-okay to discharge Follow-up with primary neurologist-Dr. Allena Katz postdischarge.   AKI Likely hemodynamically/osmotic kidney injury in the setting of DKA Resolved   CAD No anginal symptoms Aspirin/statin   Chronic HFpEF Compensated Resume usual diuretic regimen on discharge. Holding  beta-blocker for now-given risk for myasthenia flare-resume in the next several days/weeks when able.   PAF Sinus rhythm Per prior notes-not anticoagulated-as she apparently had 1 episode during a prior hospitalization.   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   Hypothyroidism Synthroid   Mood disorder-anxiety/depression Dementia Appears stable Continue galantamine/nortriptyline/hydroxyzine Delirium precautions   OSA Noncompliant to CPAP-has not been using in the hospital.   BMI: Estimated body mass index is 27.58 kg/m as calculated from the following:   Height as of this encounter: 5\' 1"  (1.549 m).   Weight as of this encounter: 66.2 kg.   Discharge Diagnoses:  Principal Problem:   DKA (diabetic ketoacidosis) (HCC) Active Problems:   Myasthenia gravis (HCC)   A-fib (HCC)   HTN (hypertension)   Hypothyroidism   CAD (coronary artery disease)   Chronic diastolic CHF (congestive heart failure) (HCC)   AKI (acute kidney injury) [facility])   Discharge Instructions:  Activity:  As tolerated with Full fall precautions use walker/cane & assistance as needed   Discharge Instructions     Call MD for:  difficulty breathing, headache or visual disturbances   Complete by: As directed    Call MD for:  extreme fatigue   Complete by: As directed    Call MD for:  persistant dizziness or light-headedness   Complete by: As directed    Diet - low sodium heart healthy   Complete by: As directed    Diet Carb Modified   Complete by: As directed    Discharge  instructions   Complete by: As directed    Follow with Primary MD  Uvaldo Bristle, NP in 1-2 weeks  Please get a complete blood count and chemistry panel checked by your Primary MD at your next visit, and again as instructed by your Primary MD.  Get Medicines reviewed and adjusted: Please take all your medications with you for your next visit with your Primary  MD  Laboratory/radiological data: Please request your Primary MD to go over all hospital tests and procedure/radiological results at the follow up, please ask your Primary MD to get all Hospital records sent to his/her office.  In some cases, they will be blood work, cultures and biopsy results pending at the time of your discharge. Please request that your primary care M.D. follows up on these results.  Also Note the following: If you experience worsening of your admission symptoms, develop shortness of breath, life threatening emergency, suicidal or homicidal thoughts you must seek medical attention immediately by calling 911 or calling your MD immediately  if symptoms less severe.  You must read complete instructions/literature along with all the possible adverse reactions/side effects for all the Medicines you take and that have been prescribed to you. Take any new Medicines after you have completely understood and accpet all the possible adverse reactions/side effects.   Do not drive when taking Pain medications or sleeping medications (Benzodaizepines)  Do not take more than prescribed Pain, Sleep and Anxiety Medications. It is not advisable to combine anxiety,sleep and pain medications without talking with your primary care practitioner  Special Instructions: If you have smoked or chewed Tobacco  in the last 2 yrs please stop smoking, stop any regular Alcohol  and or any Recreational drug use.  Wear Seat belts while driving.  Please note: You were cared for by a hospitalist during your hospital stay. Once you are discharged, your primary care physician will handle any further medical issues. Please note that NO REFILLS for any discharge medications will be authorized once you are discharged, as it is imperative that you return to your primary care physician (or establish a relationship with a primary care physician if you do not have one) for your post hospital discharge needs so that  they can reassess your need for medications and monitor your lab values.   Check CBGs AC/at bedtime.   Increase activity slowly   Complete by: As directed       Allergies as of 02/27/2023       Reactions   Crestor [rosuvastatin Calcium] Other (See Comments)   Caused Myalgias   Effexor [venlafaxine Hydrochloride] Other (See Comments), Hypertension   Caused BP to go up   Aricept [donepezil]    Other reaction(s): diarrhea   Cream Base Other (See Comments)   SOUR CREAM ONLY   Latex    Other reaction(s): rash   Nardil [phenelzine]    Other reaction(s): HTN   Pork-derived Products    Hibiclens [chlorhexidine Gluconate] Hives   Lipitor [atorvastatin Calcium] Rash   Lisinopril Rash   Other reaction(s): rash   Penicillins Swelling, Other (See Comments)   Tolerated Cephalosporin Date: 05/01/22   Plavix [clopidogrel Bisulfate] Rash   Simvastatin Rash   Other reaction(s): rash   Sulfa Antibiotics Rash   Sulfonamide Derivatives Rash   Tape Rash   Other reaction(s): rash        Medication List     STOP taking these medications    amLODipine 10 MG tablet Commonly known as: NORVASC  HYDROcodone-acetaminophen 5-325 MG tablet Commonly known as: NORCO/VICODIN   metoprolol succinate 100 MG 24 hr tablet Commonly known as: TOPROL-XL   PRESCRIPTION MEDICATION       TAKE these medications    acetaminophen 325 MG tablet Commonly known as: TYLENOL Take 2 tablets (650 mg total) by mouth every 6 (six) hours as needed for mild pain (or Fever >/= 101). What changed:  when to take this additional instructions   ADULT GUMMY PO Take 1 capsule by mouth daily.   Aspirin Low Dose 81 MG chewable tablet Generic drug: aspirin Chew 81 mg by mouth daily.   cholestyramine 4 g packet Commonly known as: QUESTRAN Take 4 g by mouth See admin instructions. 2 entries on MAR: 1) 4g once daily. 2) 4g twice daily as needed for loose stool.   cyanocobalamin 1000 MCG tablet Take 1 tablet  (1,000 mcg total) by mouth daily.   ezetimibe 10 MG tablet Commonly known as: ZETIA Take 10 mg by mouth daily.   fenofibrate 160 MG tablet Take 160 mg by mouth daily.   ferrous sulfate 325 (65 FE) MG tablet Take 325 mg by mouth daily.   fexofenadine 180 MG tablet Commonly known as: ALLEGRA Take 180 mg by mouth daily.   fluticasone 50 MCG/ACT nasal spray Commonly known as: FLONASE Place 1 spray into both nostrils 2 (two) times daily.   furosemide 20 MG tablet Commonly known as: LASIX TAKE 1 TABLET BY MOUTH 3 TIMES A WEEK. What changed:  how much to take how to take this when to take this additional instructions   gabapentin 300 MG capsule Commonly known as: NEURONTIN Take 300 mg by mouth 3 (three) times daily.   galantamine 8 MG 24 hr capsule Commonly known as: RAZADYNE ER Take 8 mg by mouth daily.   hydrOXYzine 25 MG tablet Commonly known as: ATARAX Take 25 mg by mouth at bedtime.   insulin glargine-yfgn 100 UNIT/ML injection Commonly known as: SEMGLEE Inject 0.2 mLs (20 Units total) into the skin at bedtime.   levothyroxine 88 MCG tablet Commonly known as: SYNTHROID Take 88 mcg by mouth daily before breakfast.   lidocaine 5 % Commonly known as: Lidoderm Place 1 patch onto the skin daily. Remove & Discard patch within 12 hours or as directed by MD What changed:  when to take this additional instructions   losartan 50 MG tablet Commonly known as: COZAAR Take 50 mg by mouth See admin instructions. 50 mg once daily. HOLD for sBP < 100.   melatonin 3 MG Tabs tablet Take 1 tablet (3 mg total) by mouth at bedtime.   meloxicam 7.5 MG tablet Commonly known as: Mobic Take 1 tablet (7.5 mg total) by mouth daily.   metFORMIN 500 MG tablet Commonly known as: GLUCOPHAGE Take 500 mg by mouth 2 (two) times daily.   methocarbamol 500 MG tablet Commonly known as: ROBAXIN Take 1 tablet (500 mg total) by mouth 2 (two) times daily.   niacin 500 MG  tablet Commonly known as: VITAMIN B3 Take 1,000 mg by mouth at bedtime. What changed: Another medication with the same name was removed. Continue taking this medication, and follow the directions you see here.   nortriptyline 50 MG capsule Commonly known as: PAMELOR Take 2 capsules (100 mg total) by mouth at bedtime.   NovoLOG FlexPen 100 UNIT/ML FlexPen Generic drug: insulin aspart 0-9 Units, Subcutaneous, 3 times daily with meals CBG < 70: Implement Hypoglycemia measures CBG 70 - 120: 0 units CBG 121 -  150: 1 unit CBG 151 - 200: 2 units CBG 201 - 250: 3 units CBG 251 - 300: 5 units CBG 301 - 350: 7 units CBG 351 - 400: 9 units CBG > 400: call MD   pantoprazole 40 MG tablet Commonly known as: PROTONIX Take 40 mg by mouth 2 (two) times daily.   predniSONE 20 MG tablet Commonly known as: DELTASONE Take 1 tablet (20 mg total) by mouth daily with breakfast.   pyridostigmine 60 MG tablet Commonly known as: Mestinon Take half-tablet at 10a and 4pm. What changed:  how much to take how to take this when to take this additional instructions   sodium chloride 0.65 % Soln nasal spray Commonly known as: OCEAN Place 1 spray into both nostrils every 2 (two) hours as needed for congestion (dry nasal passage).   spironolactone 25 MG tablet Commonly known as: ALDACTONE Take 1 tablet (25 mg total) by mouth daily.   Vitamin D3 125 MCG (5000 UT) Caps Take 5,000 Units by mouth daily.        Contact information for follow-up providers     Uvaldo Bristle, NP. Schedule an appointment as soon as possible for a visit in 1 week(s).   Specialty: Gerontology Contact information: [address] [place] [address] [phone]         Glendale Chard, DO. Schedule an appointment as soon as possible for a visit in 1 week(s).   Specialty: Neurology Contact information: [address] [place] [address] [phone]              Contact information for  after-discharge care     Destination     [facility] .   Service: Skilled Nursing Contact information: [address] [place] [address] [phone]                    Allergies  Allergen Reactions   Crestor [Rosuvastatin Calcium] Other (See Comments)    Caused Myalgias   Effexor [Venlafaxine Hydrochloride] Other (See Comments) and Hypertension    Caused BP to go up   Aricept [Donepezil]     Other reaction(s): diarrhea   Cream Base Other (See Comments)    SOUR CREAM ONLY   Latex     Other reaction(s): rash   Nardil [Phenelzine]     Other reaction(s): HTN   Pork-Derived Products    Hibiclens [Chlorhexidine Gluconate] Hives   Lipitor [Atorvastatin Calcium] Rash   Lisinopril Rash    Other reaction(s): rash   Penicillins Swelling and Other (See Comments)    Tolerated Cephalosporin Date: 05/01/22     Plavix [Clopidogrel Bisulfate] Rash   Simvastatin Rash    Other reaction(s): rash   Sulfa Antibiotics Rash   Sulfonamide Derivatives Rash   Tape Rash    Other reaction(s): rash     Other Procedures/Studies: DG Chest Portable 1 View  Result Date: 02/21/2023 CLINICAL DATA:  Weakness EXAM: PORTABLE CHEST 1 VIEW COMPARISON:  03/27/2022 FINDINGS: Lungs are clear. No pneumothorax or pleural effusion. Cardiac size within normal limits. Pulmonary vascularity is normal. 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 No acute bone abnormality. IMPRESSION: No active disease. Electronically Signed   By: Helyn Numbers M.D.   On: 02/21/2023 20:51     TODAY-DAY OF DISCHARGE:  Subjective:   Heidi [name] today has no headache,no chest abdominal pain,no new weakness tingling or numbness, feels much better wants to go home  today. *  Objective:   Blood pressure 135/67, pulse 95, temperature 98.1 F (36.7 C), temperature source Oral, resp. rate 17, height 5\' 1"  (1.549 m), weight 66.2 kg, SpO2 97  %.  Intake/Output Summary (Last 24 hours) at 02/27/2023 0926 Last data filed at 02/26/2023 2300 Gross per 24 hour  Intake --  Output 400 ml  Net -400 ml   Filed Weights   02/21/23 2125  Weight: 66.2 kg    Exam: Awake Alert, Oriented *3, No new F.N deficits, Normal affect [place].AT,PERRAL Supple Neck,No JVD, No cervical lymphadenopathy appriciated.  Symmetrical Chest wall movement, Good air movement bilaterally, CTAB RRR,No Gallops,Rubs or new Murmurs, No Parasternal Heave +ve B.Sounds, Abd Soft, Non tender, No organomegaly appriciated, No rebound -guarding or rigidity. No Cyanosis, Clubbing or edema, No new Rash or bruise   PERTINENT RADIOLOGIC STUDIES: No results found.   PERTINENT LAB RESULTS: CBC: No results for input(s): "WBC", "HGB", "HCT", "PLT" in the last 72 hours. CMET CMP     Component Value Date/Time   NA 135 02/26/2023 0403   K 3.9 02/26/2023 0403   CL 105 02/26/2023 0403   CO2 23 02/26/2023 0403   GLUCOSE 69 (L) 02/26/2023 0403   BUN 8 02/26/2023 0403   CREATININE 0.78 02/26/2023 0403   CREATININE 0.93 06/18/2016 1528   CALCIUM 9.5 02/26/2023 0403   PROT 6.3 (L) 05/27/2022 1925   ALBUMIN 2.5 (L) 02/23/2023 0348   AST 18 05/27/2022 1925   ALT 18 05/27/2022 1925   ALKPHOS 93 05/27/2022 1925   BILITOT 0.5 05/27/2022 1925   GFRNONAA >60 02/26/2023 0403   GFRAA >60 10/16/2018 1916    GFR Estimated Creatinine Clearance: 48.1 mL/min (by C-G formula based on SCr of 0.78 mg/dL). No results for input(s): "LIPASE", "AMYLASE" in the last 72 hours. No results for input(s): "CKTOTAL", "CKMB", "CKMBINDEX", "TROPONINI" in the last 72 hours. Invalid input(s): "POCBNP" No results for input(s): "DDIMER" in the last 72 hours. No results for input(s): "HGBA1C" in the last 72 hours. No results for input(s): "CHOL", "HDL", "LDLCALC", "TRIG", "CHOLHDL", "LDLDIRECT" in the last 72 hours. No results for input(s): "TSH", "T4TOTAL", "T3FREE", "THYROIDAB" in the last 72  hours.  Invalid input(s): "FREET3" No results for input(s): "VITAMINB12", "FOLATE", "FERRITIN", "TIBC", "IRON", "RETICCTPCT" in the last 72 hours. Coags: No results for input(s): "INR" in the last 72 hours.  Invalid input(s): "PT" Microbiology: Recent Results (from the past 240 hour(s))  Resp panel by RT-PCR (RSV, Flu A&B, Covid) Anterior Nasal Swab     Status: None   Collection Time: 02/21/23  8:36 PM   Specimen: Anterior Nasal Swab  Result Value Ref Range Status   SARS Coronavirus 2 by RT PCR NEGATIVE NEGATIVE Final   Influenza A by PCR NEGATIVE NEGATIVE Final   Influenza B by PCR NEGATIVE NEGATIVE Final    Comment: (NOTE) The Xpert Xpress SARS-CoV-2/FLU/RSV plus assay is intended as an aid in the diagnosis of influenza from Nasopharyngeal swab specimens and should not be used as a sole basis for treatment. Nasal washings and aspirates are unacceptable for Xpert Xpress SARS-CoV-2/FLU/RSV testing.  Fact Sheet for Patients: BloggerCourse.com  Fact Sheet for Healthcare Providers: SeriousBroker.it  This test is not yet approved or cleared by the [place] FDA and has been authorized for detection and/or diagnosis of SARS-CoV-2 by FDA under an Emergency Use Authorization (EUA). This EUA will remain in effect (meaning this test can be used) for the duration of the COVID-19 declaration under Section 564(b)(1) of the  Act, 21 U.S.C. section 360bbb-3(b)(1), unless the authorization is terminated or revoked.     Resp Syncytial Virus by PCR NEGATIVE NEGATIVE Final    Comment: (NOTE) Fact Sheet for Patients: BloggerCourse.com  Fact Sheet for Healthcare Providers: SeriousBroker.it  This test is not yet approved or cleared by the [place] FDA and has been authorized for detection and/or diagnosis of SARS-CoV-2 by FDA under an Emergency Use Authorization (EUA). This EUA will  remain in effect (meaning this test can be used) for the duration of the COVID-19 declaration under Section 564(b)(1) of the Act, 21 U.S.C. section 360bbb-3(b)(1), unless the authorization is terminated or revoked.  Performed at [facility], [address] [place], [place] [address]     FURTHER DISCHARGE INSTRUCTIONS:  Get Medicines reviewed and adjusted: Please take all your medications with you for your next visit with your Primary MD  Laboratory/radiological data: Please request your Primary MD to go over all hospital tests and procedure/radiological results at the follow up, please ask your Primary MD to get all Hospital records sent to his/her office.  In some cases, they will be blood work, cultures and biopsy results pending at the time of your discharge. Please request that your primary care M.D. goes through all the records of your hospital data and follows up on these results.  Also Note the following: If you experience worsening of your admission symptoms, develop shortness of breath, life threatening emergency, suicidal or homicidal thoughts you must seek medical attention immediately by calling 911 or calling your MD immediately  if symptoms less severe.  You must read complete instructions/literature along with all the possible adverse reactions/side effects for all the Medicines you take and that have been prescribed to you. Take any new Medicines after you have completely understood and accpet all the possible adverse reactions/side effects.   Do not drive when taking Pain medications or sleeping medications (Benzodaizepines)  Do not take more than prescribed Pain, Sleep and Anxiety Medications. It is not advisable to combine anxiety,sleep and pain medications without talking with your primary care practitioner  Special Instructions: If you have smoked or chewed Tobacco  in the last 2 yrs please stop smoking, stop any regular Alcohol  and or any Recreational drug  use.  Wear Seat belts while driving.  Please note: You were cared for by a hospitalist during your hospital stay. Once you are discharged, your primary care physician will handle any further medical issues. Please note that NO REFILLS for any discharge medications will be authorized once you are discharged, as it is imperative that you return to your primary care physician (or establish a relationship with a primary care physician if you do not have one) for your post hospital discharge needs so that they can reassess your need for medications and monitor your lab values.  Total Time spent coordinating discharge including counseling, education and face to face time equals greater than 30 minutes.  SignedJeoffrey Massed 02/27/2023 9:26 AM

## 2023-02-27 NOTE — TOC Transition Note (Signed)
Transition of Care [facility]) - CM/SW Discharge Note   Patient Details  Name: Heidi [name] MRN: [id number] Date of Birth: November 03, 1940  [facility] CM/SW Contact:  Mearl Latin, LCSW Phone Number: 02/27/2023, 3:35 PM   Clinical Narrative:    Patient will DC to: [facility] Anticipated DC date: 02/27/23 Family notified: Daughter Transport by: Sharin Mons   Per MD patient ready for DC to [facility]. RN to call report prior to discharge [phone] room [facility]). RN, patient, patient's family, and facility notified of DC. Discharge Summary and FL2 sent to facility. DC packet on chart including signed DNR. Ambulance transport requested for patient.   CSW will sign off for now as social work intervention is no longer needed. Please consult [place] again if new needs arise.     Final next level of care: Skilled Nursing Facility Barriers to Discharge: Barriers Resolved   Patient Goals and CMS Choice CMS Medicare.gov Compare Post Acute Care list provided to:: Patient Choice offered to / list presented to : Patient  Discharge Placement     Existing PASRR number confirmed : 02/27/23          [facility] Patient to be transferred to facility by: [facility] Name of family member notified: Daughter Patient and family notified of of transfer: 02/27/23  Discharge Plan and Services Additional resources added to the After Visit Summary for   In-house Referral: Clinical Social Work   Post Acute Care Choice: Skilled Nursing Facility                               Social Determinants of Health (SDOH) Interventions SDOH Screenings   Food Insecurity: No Food Insecurity (11/12/2022)  Housing: Low Risk  (11/12/2022)  Transportation Needs: No Transportation Needs (11/12/2022)  Utilities: Not At Risk (11/12/2022)  Tobacco Use: Medium Risk (02/21/2023)     Readmission Risk Interventions     No data to display

## 2023-03-05 ENCOUNTER — Ambulatory Visit (INDEPENDENT_AMBULATORY_CARE_PROVIDER_SITE_OTHER): Payer: HMO

## 2023-03-05 DIAGNOSIS — I495 Sick sinus syndrome: Secondary | ICD-10-CM | POA: Diagnosis not present

## 2023-03-06 LAB — CUP PACEART REMOTE DEVICE CHECK
Battery Impedance: 2176 Ohm
Battery Remaining Longevity: 36 mo
Battery Voltage: 2.75 V
Brady Statistic AP VP Percent: 0 %
Brady Statistic AP VS Percent: 2 %
Brady Statistic AS VP Percent: 0 %
Brady Statistic AS VS Percent: 98 %
Date Time Interrogation Session: 20240529085113
Implantable Lead Connection Status: 753985
Implantable Lead Connection Status: 753985
Implantable Lead Implant Date: 20070710
Implantable Lead Implant Date: 20070710
Implantable Lead Location: 753859
Implantable Lead Location: 753860
Implantable Lead Model: 4469
Implantable Lead Model: 4470
Implantable Lead Serial Number: 483166
Implantable Lead Serial Number: 541525
Implantable Pulse Generator Implant Date: 20130410
Lead Channel Impedance Value: 585 Ohm
Lead Channel Impedance Value: 611 Ohm
Lead Channel Pacing Threshold Amplitude: 0.625 V
Lead Channel Pacing Threshold Amplitude: 0.625 V
Lead Channel Pacing Threshold Pulse Width: 0.4 ms
Lead Channel Pacing Threshold Pulse Width: 0.4 ms
Lead Channel Setting Pacing Amplitude: 2 V
Lead Channel Setting Pacing Amplitude: 2.5 V
Lead Channel Setting Pacing Pulse Width: 0.4 ms
Lead Channel Setting Sensing Sensitivity: 5.6 mV
Zone Setting Status: 755011
Zone Setting Status: 755011

## 2023-03-24 ENCOUNTER — Encounter: Payer: Self-pay | Admitting: Neurology

## 2023-03-24 ENCOUNTER — Ambulatory Visit (INDEPENDENT_AMBULATORY_CARE_PROVIDER_SITE_OTHER): Payer: HMO | Admitting: Neurology

## 2023-03-24 ENCOUNTER — Other Ambulatory Visit: Payer: Self-pay | Admitting: Pharmacy Technician

## 2023-03-24 VITALS — BP 150/75 | HR 100 | Ht 61.0 in | Wt 153.0 lb

## 2023-03-24 DIAGNOSIS — G7001 Myasthenia gravis with (acute) exacerbation: Secondary | ICD-10-CM

## 2023-03-24 NOTE — Patient Instructions (Signed)
We will adjust IVIG to every 3 weeks  Continue prednisone 20mg  daily and mestinon 30mg  twice daily  I will see you back in 2-3 months

## 2023-03-24 NOTE — Progress Notes (Signed)
Follow-up Visit   Date: 03/24/2023    Heidi [name] MRN: [id number] DOB: 1941/06/18    Heidi [name] is a 82 y.o. 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  The patient was accompanied to the clinic by daughter who also provides collateral information.     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  Symptoms initially presented with dysarthria, diplopia, ptosis, and dysphagia. AChR and MUSK antibody negative, thymoma negative.  She reports noticing benefit with mestinon but has been unable to tolerate it due to GI side effects.  She was hospitalized in February 2023 for MG exacerbation and received IVIG.  In May 2023, she was readmitted with DKA and MG exacerbation.  Due to progressive dysphagia and generalized weakness, adjust IVIG to 1g/kg every 3 weeks.  She is unable to get home infusion at SNF, so will arrange at [facility] on [address].   Continue prednisone to 20mg  daily Continue mestinon to 30mg  twice daily at 10a and 4p due to GI side effects  Return to clinic in 2-3 months  --------------------------------------------- UPDATE 07/17/2022:  She was hospitalized in August for falls and hallucinations, found to be associated with her dementia.  She was discharged to rehab and now back at [facility].  She was falling frequently prior to her hospitalization, but reports no falls over the past 2 months.  Since being home, she has noticed difficulty with speaking and words being slurred, especially as the day progresses.  She continues to have intermittent diplopia and wears prisms.  Eyelids still can be droopy, but slightly better than before.  Sometimes, her food gets stuck in her throat.  No arms or leg weakness.   She ha spells of visual distortion,  described as if a jug-saw puzzle pieces are moving.  This occurs most days of the week.  She also reports having headaches at the left base of her head.   UPDATE 07/31/2022:  She started mestinon 60mg  1 tablet at 9am and 2pm.  She has noticed that about 4-5 hours after she takes the tablet, her speech starts to get slurred and double vision returns.  She has some difficulty swallowing her morning medications.  No problems with eating.  She also reports having spells of leg weakness and had to use a wheelchair at one point after playing bingo.   UPDATE 08/28/2022:    In October, she had EMG was consistent with myasthenia gravis and was started on prednisone 10mg  daily.  She noticed marked improvement in leg strength, swallowing, speech, and double vision.  She continues to have mild intermittent double vision and difficulty with getting words out.  No slurred speech.  She has been taking mestinon 60mg  three times daily, but reports having diarrhea with it.  It does help, because by around 5-6pm, she is feeling generalized weakness again.   UPDATE 10/30/2022:  She is here for follow-up visit.  She reports no significant change in her double vision or difficulty talking since her last visit, despite increasing prednisone to 15mg  daily.  She also feels that her legs are weak and has started PT for balance.    Due to GI upset, she reduced mestinon to 30mg  at 9am, 60mg  at 2pm, and 30mg  at 5pm, which has helped diarrhea, but she continues to have cramps.  Overall, she is not feeling well.   UPDATE 12/30/2022:  She is here for follow-up visit.  She was hospitalized in February because of increased weakness and falls and received IVIG x 5 days.  She feels it may have stabilized her symptoms, but still feels weak and takes her medications with applesauce.  She continues to have mild slurred speech.  She walks with a walker even for short distances.  She will be getting home PT/OT.    UPDATE 03/24/2023:  She is here for  follow-up visit.  She was hospitalized in May due to DKA which daughter says was due to patient eating soft foods and drinking sweet due because of dysphagia.  While hospitalized she received a dose of IVIG because of generalized weakness.  Patient feels that IVIG helps, but by week 3 her symptoms start to get worse again.  She is due for infusion as her last one was in May when hospitalized. She has since moved to SNF at [facility] in [place].  Unfortunately, her SNF does not allow infusion at the facility. She remains on prednisone 20mg /d and mestinon 30mg  twice daily.  She still has weakness in the legs, dysphagia, and mild dysarthria.  She remains on mechanical soft diet and can use a walker with one-person assist, but does not do this very often.   Medications:  Current Outpatient Medications on File Prior to Visit  Medication Sig Dispense Refill   ASPIRIN LOW DOSE 81 MG chewable tablet Chew 81 mg by mouth daily.     Cholecalciferol (VITAMIN D3) 125 MCG (5000 UT) CAPS Take 5,000 Units by mouth daily.     cholestyramine (QUESTRAN) 4 g packet Take 4 g by mouth See admin instructions. 2 entries on MAR: 1) 4g once daily. 2) 4g twice daily as needed for loose stool.     cyanocobalamin 1000 MCG tablet Take 1 tablet (1,000 mcg total) by mouth daily.     ezetimibe (ZETIA) 10 MG tablet Take 10 mg by mouth daily.     fenofibrate 160 MG tablet Take 160 mg by mouth daily.     ferrous sulfate 325 (65 FE) MG tablet Take 325 mg by mouth daily.     fexofenadine (ALLEGRA) 180 MG tablet Take 180 mg by mouth daily.     fluticasone (FLONASE) 50 MCG/ACT nasal spray Place 1 spray into both nostrils 2 (two) times daily.     Fluticasone Propionate, Inhal, 50 MCG/ACT AEPB Inhale into the lungs.     furosemide (LASIX) 20 MG tablet TAKE 1 TABLET BY MOUTH 3 TIMES A WEEK. (Patient taking differently: Take 20 mg by mouth every Monday, Wednesday, and Friday.) 45 tablet 3   gabapentin (NEURONTIN) 300 MG capsule Take 300  mg by mouth 3 (three) times daily.     galantamine (RAZADYNE ER) 8 MG 24 hr capsule Take 8 mg by mouth daily.  2   hydrOXYzine (ATARAX/VISTARIL) 25 MG tablet Take 25 mg by mouth at bedtime.     insulin aspart (NOVOLOG FLEXPEN) 100 UNIT/ML FlexPen 0-9 Units, Subcutaneous, 3 times daily with meals CBG < 70: Implement Hypoglycemia measures CBG 70 - 120: 0 units CBG 121 - 150: 1 unit CBG 151 - 200: 2 units CBG 201 - 250: 3 units CBG 251 - 300: 5 units CBG 301 - 350: 7 units CBG 351 - 400: 9 units CBG > 400: call MD 15 mL 0   insulin glargine-yfgn (SEMGLEE) 100 UNIT/ML injection Inject 0.2 mLs (20 Units total) into the skin at bedtime. 10 mL 11   levothyroxine (SYNTHROID)  88 MCG tablet Take 88 mcg by mouth daily before breakfast.     lidocaine (LIDODERM) 5 % Place 1 patch onto the skin daily. Remove & Discard patch within 12 hours or as directed by MD (Patient taking differently: Place 1 patch onto the skin daily. To right hip/thigh every 12 hours) 30 patch 0   losartan (COZAAR) 50 MG tablet Take 50 mg by mouth See admin instructions. 50 mg once daily. HOLD for sBP < 100.     melatonin 3 MG TABS tablet Take 1 tablet (3 mg total) by mouth at bedtime.  0   meloxicam (MOBIC) 7.5 MG tablet Take 1 tablet (7.5 mg total) by mouth daily. 15 tablet 0   metFORMIN (GLUCOPHAGE) 500 MG tablet Take 500 mg by mouth 2 (two) times daily.     methocarbamol (ROBAXIN) 500 MG tablet Take 1 tablet (500 mg total) by mouth 2 (two) times daily. 20 tablet 0   Multiple Vitamins-Minerals (ADULT GUMMY PO) Take 1 capsule by mouth daily.     niacin (VITAMIN B3) 500 MG tablet Take 1,000 mg by mouth at bedtime.     nortriptyline (PAMELOR) 50 MG capsule Take 2 capsules (100 mg total) by mouth at bedtime.     pantoprazole (PROTONIX) 40 MG tablet Take 40 mg by mouth 2 (two) times daily.     predniSONE (DELTASONE) 20 MG tablet Take 1 tablet (20 mg total) by mouth daily with breakfast. 30 tablet 3   pyridostigmine (MESTINON) 60 MG tablet  Take half-tablet at 10a and 4pm. (Patient taking differently: Take 30 mg by mouth 2 (two) times daily at 10 am and 4 pm.) 90 tablet 5   sodium chloride (OCEAN) 0.65 % SOLN nasal spray Place 1 spray into both nostrils every 2 (two) hours as needed for congestion (dry nasal passage).     spironolactone (ALDACTONE) 25 MG tablet Take 1 tablet (25 mg total) by mouth daily. 30 tablet 11   acetaminophen (TYLENOL) 325 MG tablet Take 2 tablets (650 mg total) by mouth every 6 (six) hours as needed for mild pain (or Fever >/= 101). (Patient taking differently: Take 650 mg by mouth See admin instructions. 2 entries on MAR: 1) 650 mg three times daily (scheduled) 2) 650 mg every 6 hours as needed for pain)     No current facility-administered medications on file prior to visit.    Allergies:  Allergies  Allergen Reactions   Crestor [Rosuvastatin Calcium] Other (See Comments)    Caused Myalgias   Effexor [Venlafaxine Hydrochloride] Other (See Comments) and Hypertension    Caused BP to go up   Aricept [Donepezil]     Other reaction(s): diarrhea   Cream Base Other (See Comments)    SOUR CREAM ONLY   Latex     Other reaction(s): rash   Nardil [Phenelzine]     Other reaction(s): HTN   Pork-Derived Products    Hibiclens [Chlorhexidine Gluconate] Hives   Lipitor [Atorvastatin Calcium] Rash   Lisinopril Rash    Other reaction(s): rash   Penicillins Swelling and Other (See Comments)    Tolerated Cephalosporin Date: 05/01/22     Plavix [Clopidogrel Bisulfate] Rash   Simvastatin Rash    Other reaction(s): rash   Sulfa Antibiotics Rash   Sulfonamide Derivatives Rash   Tape Rash    Other reaction(s): rash    Vital Signs:  BP (!) 150/75   Pulse 100   Ht 5\' 1"  (1.549 m)   Wt 153 lb (69.4 kg)  SpO2 94%   BMI 28.91 kg/m     Neurological Exam: MENTAL STATUS including orientation to time, place, person, recent and remote memory, attention span and concentration, language, and fund of knowledge  is normal.  Speech is mildly dysarthric.  CRANIAL NERVES:  No visual field defects.  Pupils equal round and reactive to light.  Normal conjugate, extra-ocular eye movements in all directions of gaze. No ptosis at rest or with sustained upgaze .  Face is symmetric.  Oribicularis oculi is 5/5.  Buccinator is 4/5. Palate elevates symmetrically.  Tongue is midline and strength is 5-/5.   MOTOR:  Motor strength is 5/5 in all extremities, there is some fatigability in the legs.  No atrophy, fasciculations or abnormal movements.  No pronator drift.  Tone is normal.    COORDINATION/GAIT:  Gait not tested today, patient arrived in wheelchair.    Data: CT head 05/29/2022:  negative  Lab Results  Component Value Date   TSH 5.134 (H) 05/27/2022    Lab Results  Component Value Date   HGBA1C 7.4 (H) 11/15/2022   CT lumbar spine wo contrast 11/11/2022: 1. No acute osseous injury within the lumbar spine. 2. 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 3. 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  Total time spent reviewing records, interview, history/exam, documentation, and coordination of care on day of encounter:  30 min      Thank you for allowing me to participate in patient's care.  If I can answer any additional questions, I would be pleased to do so.    Sincerely,    Jackey Housey K. Allena Katz, DO

## 2023-03-27 NOTE — Progress Notes (Signed)
Remote pacemaker transmission.   

## 2023-03-28 ENCOUNTER — Telehealth: Payer: Self-pay

## 2023-03-28 NOTE — Telephone Encounter (Signed)
Spoke to the Rep from [facility] and she had informed me that the SNF facility does not have transportation. I mentioned to her that the patient had told Dr. Allena Katz that the faci;ity would take her to her appointments if we switched it over to [facility]. [facility] Rep asked if maybe doing infusions at the daughters house was an option? I informed her I am not sure about that and stated that if she is going to need to be transported to her daughters house they might just transport her to a facility ([facility] infusion) because it might be less hassle on the daughter and her family. I informed the rep I would send Dr.Patel a message about this.

## 2023-04-01 ENCOUNTER — Telehealth: Payer: Self-pay | Admitting: Neurology

## 2023-04-01 NOTE — Telephone Encounter (Signed)
Unfortunately, her only option is to go to [facility] infusion center.  We will need to discontinue IVIG orders with [facility].

## 2023-04-01 NOTE — Telephone Encounter (Signed)
Patient needs to speak to someone about what is going on with her. And getting started with her infusions

## 2023-04-02 ENCOUNTER — Telehealth: Payer: Self-pay | Admitting: Pharmacy Technician

## 2023-04-02 NOTE — Telephone Encounter (Signed)
Called patient and informed her that she will be having her IVIG at [facility] and we will discontinue IVIG with [facility]. Patient verbalized understanding and had no further questions.  Email Sent to [facility] Rep Victorino Dike to inform of discontinuation of IVIG with [facility].

## 2023-04-02 NOTE — Telephone Encounter (Signed)
Called patient and she wanted to let Dr. Allena Katz know that the other night when she was getting to bed she had an episode where her head felt like it was "rolling." She states that was the only time it happened and she thought it was due to her being tired. I informed patient that I will let Dr. Allena Katz know and give her a call back.   Patient verbalized understanding and had no further questions or concerns.  See previous encounter where I have spoken to patient about her infusions.

## 2023-04-02 NOTE — Telephone Encounter (Addendum)
Dr. Allena Katz, Lorain Childes note:  Auth Submission: APPROVED Site of care: Site of care: [facility] Payer: [employer] ADVT Medication & CPT/J Code(s) submitted: [employer] (IVIG) J1459 Route of submission (phone, fax, portal):  Phone # [phone] OPT3 Fax # [phone] Auth type: Buy/Bill Units/visits requested: wt based- Q4WKS  Reference number: [id number] Approval from: 04/01/23 to 06/30/23    Patient will be scheduled as soon as possible.

## 2023-04-03 NOTE — Telephone Encounter (Signed)
Called patient and informed her of Dr. Eliane Decree advice below. Patient stated she will check her BP next time it happens. Patient verbalized understanding and had no further questions or concerns.

## 2023-04-03 NOTE — Telephone Encounter (Signed)
Recommend that she continue to monitor her symptoms.  Did her caregiver check her blood pressure when this happened?  If it happens again, please ask her to check BP to make sure it is not too low.  What she is describing is not seen with myasthenia gravis.  Thank you.

## 2023-04-16 ENCOUNTER — Ambulatory Visit: Payer: HMO

## 2023-04-16 VITALS — BP 170/81 | HR 100 | Temp 98.5°F | Resp 18 | Ht 61.25 in | Wt 149.2 lb

## 2023-04-16 DIAGNOSIS — G7001 Myasthenia gravis with (acute) exacerbation: Secondary | ICD-10-CM

## 2023-04-16 MED ORDER — ACETAMINOPHEN 325 MG PO TABS
650.0000 mg | ORAL_TABLET | Freq: Once | ORAL | Status: AC
  Administered 2023-04-16: 650 mg via ORAL
  Filled 2023-04-16: qty 2

## 2023-04-16 MED ORDER — DEXTROSE 5 % IV SOLN: INTRAVENOUS | Status: DC

## 2023-04-16 MED ORDER — DIPHENHYDRAMINE HCL 25 MG PO CAPS
25.0000 mg | ORAL_CAPSULE | Freq: Once | ORAL | Status: AC
  Administered 2023-04-16: 25 mg via ORAL
  Filled 2023-04-16: qty 1

## 2023-04-16 MED ORDER — IMMUNE GLOBULIN (HUMAN) 5 GM/50ML IV SOLN
1.0000 g/kg | Freq: Once | INTRAVENOUS | Status: AC
  Administered 2023-04-16: 55 g via INTRAVENOUS

## 2023-04-16 NOTE — Progress Notes (Signed)
Diagnosis: Myasthenia Gravis  Provider:  Chilton Greathouse MD  Procedure: IV Infusion  IV Type: Peripheral, IV Location: R Forearm  IVIG (Immune Globulin), Dose: 55G  Infusion Start Time: 1003  1113- Approximately 5 minutes after increasing infusion to max rate, pt began to complain of some blurred vision and tingling around mouth. Speech slightly slurred and swallowing difficulty, but this is not a new occurrence, as it is related to disease process. Infusion stopped. Blood glucose 132. Provider from pulmonary, Rubye Oaks, NP, over to assess pt. Recommended contacting ordering provider before continuing infusion. Called and spoke with Dr. Nita Sickle. Instructed to resume infusion at previous rate and notify her if any further issues after doing so. Infusion resumed.  1405- Pt not having any further issues. Tingling around mouth and blurred vision resolved. Increased rate by half per pharmacy. Will continue to monitor. 1430- Tolerating increase without any issues. Increased to max rate.  Infusion Stop Time: 1602  Infusion completed without any further incident.  Post Infusion IV Care: Peripheral IV Discontinued  Discharge: Condition: Good, Destination: Home . AVS Provided  Performed by:  Nat Math, RN

## 2023-05-07 ENCOUNTER — Ambulatory Visit: Payer: HMO

## 2023-05-07 ENCOUNTER — Ambulatory Visit (INDEPENDENT_AMBULATORY_CARE_PROVIDER_SITE_OTHER): Payer: HMO

## 2023-05-07 ENCOUNTER — Telehealth: Payer: Self-pay

## 2023-05-07 VITALS — BP 146/78 | HR 107 | Temp 98.0°F | Resp 16 | Ht 61.25 in | Wt 150.6 lb

## 2023-05-07 DIAGNOSIS — G7001 Myasthenia gravis with (acute) exacerbation: Secondary | ICD-10-CM

## 2023-05-07 MED ORDER — ACETAMINOPHEN 325 MG PO TABS
650.0000 mg | ORAL_TABLET | Freq: Once | ORAL | Status: AC
  Administered 2023-05-07: 650 mg via ORAL
  Filled 2023-05-07: qty 2

## 2023-05-07 MED ORDER — DEXTROSE 5 % IV SOLN: INTRAVENOUS | Status: DC

## 2023-05-07 MED ORDER — IMMUNE GLOBULIN (HUMAN) 5 GM/50ML IV SOLN
1.0000 g/kg | Freq: Once | INTRAVENOUS | Status: AC
  Administered 2023-05-07: 70 g via INTRAVENOUS

## 2023-05-07 MED ORDER — DIPHENHYDRAMINE HCL 25 MG PO CAPS
25.0000 mg | ORAL_CAPSULE | Freq: Once | ORAL | Status: AC
  Administered 2023-05-07: 25 mg via ORAL
  Filled 2023-05-07: qty 1

## 2023-05-07 NOTE — Progress Notes (Signed)
Diagnosis: Myasthenia Gravis  Provider:  Chilton Greathouse MD  Procedure: IV Infusion  IV Type: Peripheral, IV Location: R Forearm  IVIG (Immune Globulin), Dose: 70 g  Infusion Start Time: 1021  Infusion Stop Time: 1522  Post Infusion IV Care: Peripheral IV Discontinued  Discharge: Condition: Good, Destination: Home . AVS Provided  Performed by:  Adriana Mccallum, RN

## 2023-05-07 NOTE — Telephone Encounter (Signed)
Called to verify with Dr. Allena Katz if we need to administer IVIG as ordered which would be 70G (rounded from 68kg pt weight) or 55G which was administered at last visit. Dr. Allena Katz states to go administer as ordered which is the 70G. We will monitor closely and if she has any symptoms during titration we will reduce to previous rate and alert Dr. Allena Katz. Spoke with Pharmacist Anson Crofts. Advised to use directions as ordered. Thanks!

## 2023-05-13 ENCOUNTER — Encounter: Payer: Self-pay | Admitting: Neurology

## 2023-05-13 ENCOUNTER — Ambulatory Visit (INDEPENDENT_AMBULATORY_CARE_PROVIDER_SITE_OTHER): Payer: HMO | Admitting: Neurology

## 2023-05-13 VITALS — BP 122/78 | HR 102 | Ht 61.25 in | Wt 147.0 lb

## 2023-05-13 DIAGNOSIS — G7001 Myasthenia gravis with (acute) exacerbation: Secondary | ICD-10-CM

## 2023-05-13 NOTE — Progress Notes (Signed)
Follow-up Visit   Date: 05/13/2023    Heidi [name] MRN: [id number] DOB: 1941/07/20    Heidi [name] is a 82 y.o. 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  The patient was accompanied to the clinic by self.     IMPRESSION/PLAN: Seronegative myasthenia gravis with mild exacerbation, diagnosed 07/2022.  Symptoms initially presented with dysarthria, diplopia, ptosis, and dysphagia. AChR and MUSK antibody negative, thymoma negative.  She reports noticing benefit with mestinon but has been unable to tolerate it due to GI side effects.  She was hospitalized in February 2024 for MG exacerbation and received IVIG.  In May 2024, she was readmitted with DKA and MG exacerbation.  More recently, her IVIG was changed to every 3 weeks and this has helped her the most.  Continue IVIG 1g/kg every 3 weeks   Continue prednisone to 20mg  daily Continue mestinon to 30mg  twice daily at 10a and 4p due to GI side effects Follow-up with PCP regarding left arm skin changes  Return to clinic in 2 months  --------------------------------------------- UPDATE 07/17/2022:  She was hospitalized in August for falls and hallucinations, found to be associated with her dementia.  She was discharged to rehab and now back at [facility].  She was falling frequently prior to her hospitalization, but reports no falls over the past 2 months.  Since being home, she has noticed difficulty with speaking and words being slurred, especially as the day progresses.  She continues to have intermittent diplopia and wears prisms.  Eyelids still can be droopy, but slightly better than before.  Sometimes, her food gets stuck in her throat.  No arms or leg weakness.   She ha spells of visual distortion, described as if a jug-saw puzzle pieces are moving.  This occurs most  days of the week.  She also reports having headaches at the left base of her head.   UPDATE 07/31/2022:  She started mestinon 60mg  1 tablet at 9am and 2pm.  She has noticed that about 4-5 hours after she takes the tablet, her speech starts to get slurred and double vision returns.  She has some difficulty swallowing her morning medications.  No problems with eating.  She also reports having spells of leg weakness and had to use a wheelchair at one point after playing bingo.   UPDATE 08/28/2022:    In October, she had EMG was consistent with myasthenia gravis and was started on prednisone 10mg  daily.  She noticed marked improvement in leg strength, swallowing, speech, and double vision.  She continues to have mild intermittent double vision and difficulty with getting words out.  No slurred speech.  She has been taking mestinon 60mg  three times daily, but reports having diarrhea with it.  It does help, because by around 5-6pm, she is feeling generalized weakness again.   UPDATE 10/30/2022:  She is here for follow-up visit.  She reports no significant change in her double vision or difficulty talking since her last visit, despite increasing prednisone to 15mg  daily.  She also feels that her legs are weak and has started PT for balance.    Due to GI upset, she reduced mestinon to 30mg  at 9am, 60mg  at 2pm, and 30mg  at 5pm, which has helped diarrhea, but she continues to have cramps.  Overall, she is not feeling well.   UPDATE 12/30/2022:  She is here for follow-up  visit.  She was hospitalized in February because of increased weakness and falls and received IVIG x 5 days.  She feels it may have stabilized her symptoms, but still feels weak and takes her medications with applesauce.  She continues to have mild slurred speech.  She walks with a walker even for short distances.  She will be getting home PT/OT.    UPDATE 03/24/2023:  She is here for follow-up visit.  She was hospitalized in May due to DKA which  daughter says was due to patient eating soft foods and drinking sweet due because of dysphagia.  While hospitalized she received a dose of IVIG because of generalized weakness.  Patient feels that IVIG helps, but by week 3 her symptoms start to get worse again.  She is due for infusion as her last one was in May when hospitalized. She has since moved to SNF at [facility] in [place].  Unfortunately, her SNF does not allow infusion at the facility. She remains on prednisone 20mg /d and mestinon 30mg  twice daily.  She still has weakness in the legs, dysphagia, and mild dysarthria.  She remains on mechanical soft diet and can use a walker with one-person assist, but does not do this very often.   UPDATE 05/13/2023:  She is here for follow-up visit. She has been getting IVIG every 3 weeks for he last two infusions and reports this has made a marked improvement because she is staying stronger for longer. Over the last 3-4 days, she has noticed a significant improvement in overall strength.  She is still using a wheelchair because she does not feel strong enough to use a walker.  She is able to transfer independently.  She is able to bathe herself, but needs assistance with getting in and out of the shower.  She has mild slurred speech when tired, double vision at end-gaze, and generalized fatigue.  She does feel that her last IVIG helped and she has felt the best in a few weeks.   She also complains of redness over the left upper arm which has been present for the past 5 days.  It does not itch or cause any pain.   Medications:  Current Outpatient Medications on File Prior to Visit  Medication Sig Dispense Refill   ASPIRIN LOW DOSE 81 MG chewable tablet Chew 81 mg by mouth daily.     cholestyramine (QUESTRAN) 4 g packet Take 4 g by mouth See admin instructions. 2 entries on MAR: 1) 4g once daily. 2) 4g twice daily as needed for loose stool.     cyanocobalamin 1000 MCG tablet Take 1 tablet (1,000 mcg total) by  mouth daily.     ezetimibe (ZETIA) 10 MG tablet Take 10 mg by mouth daily.     fenofibrate 160 MG tablet Take 160 mg by mouth daily.     ferrous sulfate 325 (65 FE) MG tablet Take 325 mg by mouth daily.     fexofenadine (ALLEGRA) 180 MG tablet Take 180 mg by mouth daily.     fluticasone (FLONASE) 50 MCG/ACT nasal spray Place 1 spray into both nostrils 2 (two) times daily.     Fluticasone Propionate, Inhal, 50 MCG/ACT AEPB Inhale into the lungs.     furosemide (LASIX) 20 MG tablet TAKE 1 TABLET BY MOUTH 3 TIMES A WEEK. (Patient taking differently: Take 20 mg by mouth every Monday, Wednesday, and Friday.) 45 tablet 3   gabapentin (NEURONTIN) 300 MG capsule Take 300 mg by mouth 3 (three)  times daily.     galantamine (RAZADYNE ER) 8 MG 24 hr capsule Take 4 mg by mouth daily.  2   hydrOXYzine (ATARAX/VISTARIL) 25 MG tablet Take 25 mg by mouth at bedtime.     levothyroxine (SYNTHROID) 88 MCG tablet Take 88 mcg by mouth daily before breakfast.     lidocaine (LIDODERM) 5 % Place 1 patch onto the skin daily. Remove & Discard patch within 12 hours or as directed by MD (Patient taking differently: Place 1 patch onto the skin daily. To right hip/thigh every 12 hours) 30 patch 0   losartan (COZAAR) 50 MG tablet Take 50 mg by mouth See admin instructions. 50 mg once daily. HOLD for sBP < 100.     melatonin 3 MG TABS tablet Take 1 tablet (3 mg total) by mouth at bedtime.  0   meloxicam (MOBIC) 7.5 MG tablet Take 1 tablet (7.5 mg total) by mouth daily. 15 tablet 0   metFORMIN (GLUCOPHAGE) 500 MG tablet Take 500 mg by mouth 2 (two) times daily.     methocarbamol (ROBAXIN) 500 MG tablet Take 1 tablet (500 mg total) by mouth 2 (two) times daily. 20 tablet 0   niacin (VITAMIN B3) 500 MG tablet Take 1,000 mg by mouth at bedtime.     nortriptyline (PAMELOR) 50 MG capsule Take 2 capsules (100 mg total) by mouth at bedtime.     acetaminophen (TYLENOL) 325 MG tablet Take 2 tablets (650 mg total) by mouth every 6 (six)  hours as needed for mild pain (or Fever >/= 101). (Patient taking differently: Take 650 mg by mouth See admin instructions. 2 entries on MAR: 1) 650 mg three times daily (scheduled) 2) 650 mg every 6 hours as needed for pain)     Cholecalciferol (VITAMIN D3) 125 MCG (5000 UT) CAPS Take 5,000 Units by mouth daily.     insulin aspart (NOVOLOG FLEXPEN) 100 UNIT/ML FlexPen 0-9 Units, Subcutaneous, 3 times daily with meals CBG < 70: Implement Hypoglycemia measures CBG 70 - 120: 0 units CBG 121 - 150: 1 unit CBG 151 - 200: 2 units CBG 201 - 250: 3 units CBG 251 - 300: 5 units CBG 301 - 350: 7 units CBG 351 - 400: 9 units CBG > 400: call MD 15 mL 0   insulin glargine-yfgn (SEMGLEE) 100 UNIT/ML injection Inject 0.2 mLs (20 Units total) into the skin at bedtime. 10 mL 11   Multiple Vitamins-Minerals (ADULT GUMMY PO) Take 1 capsule by mouth daily.     pantoprazole (PROTONIX) 40 MG tablet Take 40 mg by mouth 2 (two) times daily.     predniSONE (DELTASONE) 20 MG tablet Take 1 tablet (20 mg total) by mouth daily with breakfast. 30 tablet 3   pyridostigmine (MESTINON) 60 MG tablet Take half-tablet at 10a and 4pm. (Patient taking differently: Take 30 mg by mouth 2 (two) times daily at 10 am and 4 pm.) 90 tablet 5   sodium chloride (OCEAN) 0.65 % SOLN nasal spray Place 1 spray into both nostrils every 2 (two) hours as needed for congestion (dry nasal passage).     spironolactone (ALDACTONE) 25 MG tablet Take 1 tablet (25 mg total) by mouth daily. 30 tablet 11   No current facility-administered medications on file prior to visit.    Allergies:  Allergies  Allergen Reactions   Crestor [Rosuvastatin Calcium] Other (See Comments)    Caused Myalgias   Effexor [Venlafaxine Hydrochloride] Other (See Comments) and Hypertension    Caused BP to  go up   Aricept [Donepezil]     Other reaction(s): diarrhea   Cream Base Other (See Comments)    SOUR CREAM ONLY   Latex     Other reaction(s): rash   Nardil [Phenelzine]      Other reaction(s): HTN   Pork-Derived Products    Hibiclens [Chlorhexidine Gluconate] Hives   Lipitor [Atorvastatin Calcium] Rash   Lisinopril Rash    Other reaction(s): rash   Penicillins Swelling and Other (See Comments)    Tolerated Cephalosporin Date: 05/01/22     Plavix [Clopidogrel Bisulfate] Rash   Simvastatin Rash    Other reaction(s): rash   Sulfa Antibiotics Rash   Sulfonamide Derivatives Rash   Tape Rash    Other reaction(s): rash    Vital Signs:  BP 122/78   Pulse (!) 102   Ht 5' 1.25" (1.556 m)   Wt 147 lb (66.7 kg)   SpO2 100%   BMI 27.55 kg/m     Neurological Exam: MENTAL STATUS including orientation to time, place, person, recent and remote memory, attention span and concentration, language, and fund of knowledge is normal.  Speech is better, only trace dysarthria.  CRANIAL NERVES:   Pupils equal round and reactive to light.  Normal conjugate, extra-ocular eye movements in all directions of gaze. No ptosis at rest or with sustained upgaze .  Face is symmetric.  Oribicularis oculi is 5/5.  Buccinator is 5-/5. Palate elevates symmetrically.  Tongue is midline and strength is 5/5.   MOTOR:  Motor strength is 5/5 in all extremities, there pain-limiting weakness of the left proximal arm with deltoid testing.  She has a erythematous area above the antecubital fossa on the left.   No pronator drift.  Tone is normal.    COORDINATION/GAIT:  Gait not tested today, patient arrived in wheelchair.    Data: CT head 05/29/2022:  negative  Lab Results  Component Value Date   TSH 5.134 (H) 05/27/2022    Lab Results  Component Value Date   HGBA1C 7.4 (H) 11/15/2022   CT lumbar spine wo contrast 11/11/2022: 1. No acute osseous injury within the lumbar spine. 2. 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 3. 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  Total time spent reviewing  records, interview, history/exam, documentation, and coordination of care on day of encounter:  20 min   Thank you for allowing me to participate in patient's care.  If I can answer any additional questions, I would be pleased to do so.    Sincerely,     K. Allena Katz, DO

## 2023-05-13 NOTE — Patient Instructions (Signed)
Continue your medications as you are taking  I recommend that your see the physician at the facility to evaluate the skin changes on your left arm

## 2023-05-28 ENCOUNTER — Ambulatory Visit (INDEPENDENT_AMBULATORY_CARE_PROVIDER_SITE_OTHER): Payer: HMO

## 2023-05-28 VITALS — BP 134/75 | HR 92 | Temp 97.7°F | Resp 20 | Ht 61.25 in | Wt 148.8 lb

## 2023-05-28 DIAGNOSIS — G7001 Myasthenia gravis with (acute) exacerbation: Secondary | ICD-10-CM | POA: Diagnosis not present

## 2023-05-28 MED ORDER — DIPHENHYDRAMINE HCL 25 MG PO CAPS
25.0000 mg | ORAL_CAPSULE | Freq: Once | ORAL | Status: AC
  Administered 2023-05-28: 25 mg via ORAL
  Filled 2023-05-28: qty 1

## 2023-05-28 MED ORDER — ACETAMINOPHEN 325 MG PO TABS
650.0000 mg | ORAL_TABLET | Freq: Once | ORAL | Status: AC
  Administered 2023-05-28: 650 mg via ORAL
  Filled 2023-05-28: qty 2

## 2023-05-28 MED ORDER — DEXTROSE 5 % IV SOLN: INTRAVENOUS | Status: DC

## 2023-05-28 MED ORDER — IMMUNE GLOBULIN (HUMAN) 5 GM/50ML IV SOLN
1.0000 g/kg | Freq: Once | INTRAVENOUS | Status: AC
  Administered 2023-05-28: 65 g via INTRAVENOUS

## 2023-05-28 NOTE — Progress Notes (Signed)
Diagnosis: Myasthenia Gravis  Provider:  Chilton Greathouse MD  Procedure: IV Infusion  IV Type: Peripheral, IV Location: L Antecubital  IVIG (Immune Globulin), Dose: 65 g  Infusion Start Time: 0942  Infusion Stop Time: 1440  Post Infusion IV Care: Peripheral IV Discontinued  Discharge: Condition: Good, Destination: Home . AVS Provided  Performed by:  Loney Hering, LPN

## 2023-06-04 ENCOUNTER — Ambulatory Visit (INDEPENDENT_AMBULATORY_CARE_PROVIDER_SITE_OTHER): Payer: HMO

## 2023-06-04 DIAGNOSIS — I495 Sick sinus syndrome: Secondary | ICD-10-CM

## 2023-06-04 LAB — CUP PACEART REMOTE DEVICE CHECK
Battery Impedance: 2241 Ohm
Battery Remaining Longevity: 35 mo
Battery Voltage: 2.75 V
Brady Statistic AP VP Percent: 0 %
Brady Statistic AP VS Percent: 1 %
Brady Statistic AS VP Percent: 0 %
Brady Statistic AS VS Percent: 99 %
Date Time Interrogation Session: 20240828075803
Implantable Lead Connection Status: 753985
Implantable Lead Connection Status: 753985
Implantable Lead Implant Date: 20070710
Implantable Lead Implant Date: 20070710
Implantable Lead Location: 753859
Implantable Lead Location: 753860
Implantable Lead Model: 4469
Implantable Lead Model: 4470
Implantable Lead Serial Number: 483166
Implantable Lead Serial Number: 541525
Implantable Pulse Generator Implant Date: 20130410
Lead Channel Impedance Value: 588 Ohm
Lead Channel Impedance Value: 616 Ohm
Lead Channel Pacing Threshold Amplitude: 0.75 V
Lead Channel Pacing Threshold Amplitude: 0.875 V
Lead Channel Pacing Threshold Pulse Width: 0.4 ms
Lead Channel Pacing Threshold Pulse Width: 0.4 ms
Lead Channel Setting Pacing Amplitude: 2 V
Lead Channel Setting Pacing Amplitude: 2.5 V
Lead Channel Setting Pacing Pulse Width: 0.4 ms
Lead Channel Setting Sensing Sensitivity: 5.6 mV
Zone Setting Status: 755011
Zone Setting Status: 755011

## 2023-06-12 NOTE — Progress Notes (Signed)
Remote pacemaker transmission.   

## 2023-06-18 ENCOUNTER — Ambulatory Visit (INDEPENDENT_AMBULATORY_CARE_PROVIDER_SITE_OTHER): Payer: HMO

## 2023-06-18 VITALS — BP 155/86 | HR 109 | Temp 98.3°F | Resp 20 | Ht 61.25 in | Wt 148.0 lb

## 2023-06-18 DIAGNOSIS — G7001 Myasthenia gravis with (acute) exacerbation: Secondary | ICD-10-CM | POA: Diagnosis not present

## 2023-06-18 MED ORDER — IMMUNE GLOBULIN (HUMAN) 5 GM/50ML IV SOLN
1.0000 g/kg | Freq: Once | INTRAVENOUS | Status: AC
  Administered 2023-06-18: 65 g via INTRAVENOUS

## 2023-06-18 MED ORDER — ACETAMINOPHEN 325 MG PO TABS
650.0000 mg | ORAL_TABLET | Freq: Once | ORAL | Status: AC
  Administered 2023-06-18: 650 mg via ORAL
  Filled 2023-06-18: qty 2

## 2023-06-18 MED ORDER — DIPHENHYDRAMINE HCL 25 MG PO CAPS
25.0000 mg | ORAL_CAPSULE | Freq: Once | ORAL | Status: AC
  Administered 2023-06-18: 25 mg via ORAL
  Filled 2023-06-18: qty 1

## 2023-06-18 MED ORDER — DEXTROSE 5 % IV SOLN: INTRAVENOUS | Status: DC

## 2023-06-18 NOTE — Progress Notes (Signed)
Diagnosis: Myasthenia Gravis  Provider:  Chilton Greathouse MD  Procedure: IV Infusion  IV Type: Peripheral, IV Location: L Hand  IVIG (Immune Globulin), Dose: 65g  Infusion Start Time: 1028  Infusion Stop Time: 1520  Post Infusion IV Care: Observation period completed and Peripheral IV Discontinued  Discharge: Condition: Good, Destination: Home . AVS Provided  Performed by:  Nat Math, RN

## 2023-06-30 ENCOUNTER — Telehealth: Payer: Self-pay

## 2023-06-30 NOTE — Telephone Encounter (Signed)
Auth Submission: APPROVED Site of care: Site of care: [facility] Payer: [employer] Medication & CPT/J Code(s) submitted: Privigen (IVIG) J1459 Route of submission (phone, fax, portal): Fax Phone # Fax # [phone] Auth type: Buy/Bill PB Units/visits requested: 65gm x 6 doses Reference number: Kathaleen Bury [id number] Approval from: 07/01/23 to 09/29/23   I called [employer] to confirm approval after patient called [place] stating she got notice of approval. [phone] option 3. Kathaleen Bury. Confirmed the approval dates and this is an infusion.

## 2023-07-02 ENCOUNTER — Telehealth: Payer: Self-pay | Admitting: Pharmacy Technician

## 2023-07-02 NOTE — Telephone Encounter (Signed)
ERROR

## 2023-07-04 ENCOUNTER — Ambulatory Visit: Payer: HMO | Attending: Cardiology | Admitting: Cardiology

## 2023-07-04 ENCOUNTER — Encounter: Payer: Self-pay | Admitting: Cardiology

## 2023-07-04 VITALS — BP 153/88 | HR 106 | Ht 61.0 in | Wt 149.4 lb

## 2023-07-04 DIAGNOSIS — I495 Sick sinus syndrome: Secondary | ICD-10-CM

## 2023-07-04 DIAGNOSIS — G7001 Myasthenia gravis with (acute) exacerbation: Secondary | ICD-10-CM

## 2023-07-04 DIAGNOSIS — I5032 Chronic diastolic (congestive) heart failure: Secondary | ICD-10-CM | POA: Diagnosis not present

## 2023-07-04 DIAGNOSIS — I10 Essential (primary) hypertension: Secondary | ICD-10-CM | POA: Diagnosis not present

## 2023-07-04 DIAGNOSIS — Z950 Presence of cardiac pacemaker: Secondary | ICD-10-CM

## 2023-07-04 DIAGNOSIS — I480 Paroxysmal atrial fibrillation: Secondary | ICD-10-CM

## 2023-07-04 NOTE — Progress Notes (Signed)
Cardiology Office Note:  .   Date:  07/04/2023  ID:  Heidi [name], DOB 08-Oct-1940, MRN [id number] PCP: Uvaldo Bristle, NP  [facility] Providers Cardiologist:  Donato Schultz, MD    History of Present Illness: .   Heidi [name] is a 82 y.o. female Discussed with the use of AI scribe software   History of Present Illness   The patient, with a history of paroxysmal atrial fibrillation, tachycardia-bradycardia syndrome, and a Medtronic pacemaker, reports a stable cardiac condition. She had been on Toprol 100mg  daily for rate control of atrial fibrillation, with the pacemaker as backup but it was discontinued, due to a new diagnosis of myasthenia gravis crisis 02/2023.  The patient was diagnosed with myasthenia gravis since the last visit, which has been causing difficulties with speech, swallowing, and eye function. She has experienced several crises related to this condition, one of which resulted in a seven-day hospital stay, with DKA as well.  In addition to these, the patient's blood sugar levels have been erratic, necessitating insulin therapy. She has a history of a transient ischemic attack (TIA) in June 2020, which affected her right thigh muscle. The patient's coronary artery disease was deemed nonobstructive based on a 2007 cardiac catheterization. An echocardiogram in 2023 showed an ejection fraction of 64%. A nuclear stress test in 2018 was low risk with no ischemia.         Studies Reviewed: Marland Kitchen   EKG Interpretation Date/Time:  Friday July 04 2023 09:42:11 EDT Ventricular Rate:  101 PR Interval:  164 QRS Duration:  92 QT Interval:  318 QTC Calculation: 412 R Axis:   -30  Text Interpretation: Sinus tachycardia Left axis deviation Moderate voltage criteria for LVH, may be normal variant ( R in aVL , Cornell product ) Cannot rule out Anterior infarct (cited on or before 21-Feb-2023) When compared with ECG of 21-Feb-2023 18:59,  No significant change since last  tracing  Confirmed by Donato Schultz ([id number]) on 07/04/2023 9:50:18 AM    LABS Blood glucose: 737 prior to hospitalization.  DIAGNOSTIC EKG: Sinus rhythm, heart rate 99 bpm Cardiac catheterization (2007): Nonobstructive coronary artery disease Echocardiogram (2023): Ejection fraction 64% Nuclear stress test (2018): Low risk with no ischemia Risk Assessment/Calculations:           Physical Exam:   VS:  BP (!) 153/88   Pulse (!) 106   Ht 5\' 1"  (1.549 m)   Wt 149 lb 6.4 oz (67.8 kg)   SpO2 97%   BMI 28.23 kg/m    Wt Readings from Last 3 Encounters:  07/04/23 149 lb 6.4 oz (67.8 kg)  06/18/23 148 lb (67.1 kg)  05/28/23 148 lb 12.8 oz (67.5 kg)    GEN: Well nourished, well developed in no acute distress. In wheelchair. At SNF NECK: No JVD; No carotid bruits CARDIAC: RRR, no murmurs, rubs, gallops RESPIRATORY:  Clear to auscultation without rales, wheezing or rhonchi  ABDOMEN: Soft, non-tender, non-distended EXTREMITIES:  No edema; No deformity   ASSESSMENT AND PLAN: .    Assessment and Plan    Myasthenia Gravis New diagnosis with symptoms affecting speech, swallowing, and vision. Currently on unspecified treatment. -No changes to current treatment plan. -Off of Metoprolol.   Atrial Fibrillation Stable with pacemaker backup. Previously on Metoprolol, now discontinued due to potential exacerbation of Myasthenia Gravis. Heart rate slightly elevated but acceptable. In NSR.  -Continue current management.  Tachycardia-Bradycardia Syndrome Stable with pacemaker backup. -Continue current management.  Diabetes Recent worsening control,  now requiring insulin. -No changes to current treatment plan.  Hypertension Stable on Losartan 50mg  daily. -Continue Losartan 50mg  daily.  Follow-up in 1 year.            Dispo:   Signed, Donato Schultz, MD

## 2023-07-04 NOTE — Patient Instructions (Signed)
Medication Instructions:  Your physician recommends that you continue on your current medications as directed. Please refer to the Current Medication list given to you today. *If you need a refill on your cardiac medications before your next appointment, please call your pharmacy*  Lab Work: If you have labs (blood work) drawn today and your tests are completely normal, you will receive your results only by: [place] (if you have MyChart) OR A paper copy in the mail If you have any lab test that is abnormal or we need to change your treatment, we will call you to review the results.  Testing/Procedures: None ordered today.  Follow-Up: At [facility], you and your health needs are our priority.  As part of our continuing mission to provide you with exceptional heart care, we have created designated Provider Care Teams.  These Care Teams include your primary Cardiologist (physician) and Advanced Practice Providers (APPs -  Physician Assistants and Nurse Practitioners) who all work together to provide you with the care you need, when you need it.  We recommend signing up for the patient portal called "MyChart".  Sign up information is provided on this After Visit Summary.  MyChart is used to connect with patients for Virtual Visits (Telemedicine).  Patients are able to view lab/test results, encounter notes, upcoming appointments, etc.  Non-urgent messages can be sent to your provider as well.   To learn more about what you can do with MyChart, go to [email or web address].    Your next appointment:   1 year(s)  Provider:   Candee Furbish, MD

## 2023-07-09 ENCOUNTER — Ambulatory Visit: Payer: HMO

## 2023-07-09 VITALS — BP 133/78 | HR 103 | Temp 98.1°F | Resp 20 | Ht 61.25 in | Wt 150.0 lb

## 2023-07-09 DIAGNOSIS — G7001 Myasthenia gravis with (acute) exacerbation: Secondary | ICD-10-CM

## 2023-07-09 MED ORDER — ACETAMINOPHEN 325 MG PO TABS
650.0000 mg | ORAL_TABLET | Freq: Once | ORAL | Status: AC
  Administered 2023-07-09: 650 mg via ORAL
  Filled 2023-07-09: qty 2

## 2023-07-09 MED ORDER — IMMUNE GLOBULIN (HUMAN) 5 GM/50ML IV SOLN
65.0000 g | Freq: Once | INTRAVENOUS | Status: AC
  Administered 2023-07-09: 65 g via INTRAVENOUS

## 2023-07-09 MED ORDER — DEXTROSE 5 % IV SOLN: INTRAVENOUS | Status: DC

## 2023-07-09 MED ORDER — DIPHENHYDRAMINE HCL 25 MG PO CAPS
25.0000 mg | ORAL_CAPSULE | Freq: Once | ORAL | Status: AC
  Administered 2023-07-09: 25 mg via ORAL
  Filled 2023-07-09: qty 1

## 2023-07-09 NOTE — Progress Notes (Signed)
Diagnosis: Myasthenia Gravis  Provider:  Chilton Greathouse MD  Procedure: IV Infusion  IV Type: Peripheral, IV Location: L Antecubital  Privigen (Immune Globulin), Dose: 65G  Infusion Start Time: 0950  Infusion Stop Time: 1446  Post Infusion IV Care: Observation period completed and Peripheral IV Discontinued  Discharge: Condition: Good, Destination: Home . AVS Provided  Performed by:  Garnette Czech, RN

## 2023-07-22 ENCOUNTER — Ambulatory Visit (INDEPENDENT_AMBULATORY_CARE_PROVIDER_SITE_OTHER): Payer: HMO | Admitting: Neurology

## 2023-07-22 ENCOUNTER — Encounter: Payer: Self-pay | Admitting: Neurology

## 2023-07-22 VITALS — BP 140/83 | HR 104 | Ht 61.25 in | Wt 147.0 lb

## 2023-07-22 DIAGNOSIS — G7000 Myasthenia gravis without (acute) exacerbation: Secondary | ICD-10-CM | POA: Diagnosis not present

## 2023-07-22 MED ORDER — PREDNISONE 10 MG PO TABS: 15.0000 mg | ORAL_TABLET | Freq: Every day | ORAL | 5 refills | Status: DC

## 2023-07-22 NOTE — Patient Instructions (Signed)
Reduce prednisone to 15mg  daily  Continue mestinon 30mg  twice daily as you are  Continue IVIG every 3 weeks  Return to clinic in 3 months

## 2023-07-22 NOTE — Progress Notes (Signed)
Follow-up Visit   Date: 07/22/2023    Heidi [name] MRN: [id number] DOB: [birth year]    Heidi [name] is a 82 y.o. 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  The patient was accompanied to the clinic by self.     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  Symptoms initially presented with dysarthria, diplopia, ptosis, and dysphagia. AChR and MUSK antibody negative, thymoma negative.  She reports noticing benefit with mestinon but has been unable to tolerate it due to GI side effects.  She was hospitalized in February 2024 for MG exacerbation and received IVIG.  In May 2024, she was readmitted with DKA and MG exacerbation.  She continues to have mild symptomology throughout the month, but exam has minimal evidence of disease manifestation.  Continue IVIG 1g/kg every 3 weeks   Reduce prednisone 15mg  daily Continue mestinon to 30mg  twice daily at 10a and 4p due to GI side effects Encouraged her to try to walk more with PT because leg strength is full and intact.  Return to clinic in 2 months  --------------------------------------------- UPDATE 07/17/2022:  She was hospitalized in August for falls and hallucinations, found to be associated with her dementia.  She was discharged to rehab and now back at [facility].  She was falling frequently prior to her hospitalization, but reports no falls over the past 2 months.  Since being home, she has noticed difficulty with speaking and words being slurred, especially as the day progresses.  She continues to have intermittent diplopia and wears prisms.  Eyelids still can be droopy, but slightly better than before.  Sometimes, her food gets stuck in her throat.  No arms or leg weakness.   She ha spells of visual distortion, described as  if a jug-saw puzzle pieces are moving.  This occurs most days of the week.  She also reports having headaches at the left base of her head.   UPDATE 07/31/2022:  She started mestinon 60mg  1 tablet at 9am and 2pm.  She has noticed that about 4-5 hours after she takes the tablet, her speech starts to get slurred and double vision returns.  She has some difficulty swallowing her morning medications.  No problems with eating.  She also reports having spells of leg weakness and had to use a wheelchair at one point after playing bingo.   UPDATE 08/28/2022:    In October, she had EMG was consistent with myasthenia gravis and was started on prednisone 10mg  daily.  She noticed marked improvement in leg strength, swallowing, speech, and double vision.  She continues to have mild intermittent double vision and difficulty with getting words out.  No slurred speech.  She has been taking mestinon 60mg  three times daily, but reports having diarrhea with it.  It does help, because by around 5-6pm, she is feeling generalized weakness again.   UPDATE 10/30/2022:  She is here for follow-up visit.  She reports no significant change in her double vision or difficulty talking since her last visit, despite increasing prednisone to 15mg  daily.  She also feels that her legs are weak and has started PT for balance.    Due to GI upset, she reduced mestinon to 30mg  at 9am, 60mg  at 2pm, and 30mg  at 5pm, which has helped diarrhea, but she continues to have cramps.  Overall, she is not feeling well.   UPDATE 12/30/2022:  She is here for follow-up visit.  She was hospitalized in February because of increased weakness and falls and received IVIG x 5 days.  She feels it may have stabilized her symptoms, but still feels weak and takes her medications with applesauce.  She continues to have mild slurred speech.  She walks with a walker even for short distances.  She will be getting home PT/OT.    UPDATE 03/24/2023:  She is here for follow-up  visit.  She was hospitalized in May due to DKA which daughter says was due to patient eating soft foods and drinking sweet due because of dysphagia.  While hospitalized she received a dose of IVIG because of generalized weakness.  Patient feels that IVIG helps, but by week 3 her symptoms start to get worse again.  She is due for infusion as her last one was in May when hospitalized. She has since moved to SNF at [facility] in [place].  Unfortunately, her SNF does not allow infusion at the facility. She remains on prednisone 20mg /d and mestinon 30mg  twice daily.  She still has weakness in the legs, dysphagia, and mild dysarthria.  She remains on mechanical soft diet and can use a walker with one-person assist, but does not do this very often.   UPDATE 05/13/2023:  She is here for follow-up visit. She has been getting IVIG every 3 weeks for he last two infusions and reports this has made a marked improvement because she is staying stronger for longer. Over the last 3-4 days, she has noticed a significant improvement in overall strength.  She is still using a wheelchair because she does not feel strong enough to use a walker.  She is able to transfer independently.  She is able to bathe herself, but needs assistance with getting in and out of the shower.  She has mild slurred speech when tired, double vision at end-gaze, and generalized fatigue.  She does feel that her last IVIG helped and she has felt the best in a few weeks.   She also complains of redness over the left upper arm which has been present for the past 5 days.  It does not itch or cause any pain.   UPDATE 07/22/2023:  She is here for follow-up visit. She continues to take prednisone 20mg  daily and mestinon 30mg  twice daily at 10a and 4pm.  She is also getting IVIG every 3 weeks and tolerating this well.  She has noticed that she has improved strength which lasts longer, typically she starts to get week about 3 days prior to the next infusion.  She  still has some double vision, difficulty with speech/swallow, and leg weakness, but overall is feeling much better.  She is reliant on a wheelchair because her legs feel weak. PT was started last week.    Medications:  Current Outpatient Medications on File Prior to Visit  Medication Sig Dispense Refill   acetaminophen (TYLENOL) 325 MG tablet Take 2 tablets (650 mg total) by mouth every 6 (six) hours as needed for mild pain (or Fever >/= 101). (Patient taking differently: Take 650 mg by mouth See admin instructions. 2 entries on MAR: 1) 650 mg three times daily (scheduled) 2) 650 mg every 6 hours as needed for pain)     ASPIRIN LOW DOSE 81 MG chewable tablet Chew 81 mg by mouth daily.     cholestyramine (QUESTRAN) 4 g packet Take 4 g by mouth See admin instructions. 2 entries on MAR: 1) 4g once  daily. 2) 4g twice daily as needed for loose stool.     cyanocobalamin 1000 MCG tablet Take 1 tablet (1,000 mcg total) by mouth daily.     ezetimibe (ZETIA) 10 MG tablet Take 10 mg by mouth daily.     ferrous sulfate 325 (65 FE) MG tablet Take 325 mg by mouth daily.     furosemide (LASIX) 20 MG tablet TAKE 1 TABLET BY MOUTH 3 TIMES A WEEK. (Patient taking differently: Take 20 mg by mouth every Monday, Wednesday, and Friday.) 45 tablet 3   gabapentin (NEURONTIN) 300 MG capsule Take 300 mg by mouth 3 (three) times daily.     galantamine (RAZADYNE ER) 8 MG 24 hr capsule Take 4 mg by mouth daily.  2   hydrOXYzine (ATARAX/VISTARIL) 25 MG tablet Take 25 mg by mouth at bedtime.     Cholecalciferol (VITAMIN D3) 125 MCG (5000 UT) CAPS Take 5,000 Units by mouth daily.     fenofibrate 160 MG tablet Take 160 mg by mouth daily.     fexofenadine (ALLEGRA) 180 MG tablet Take 180 mg by mouth daily.     fluticasone (FLONASE) 50 MCG/ACT nasal spray Place 1 spray into both nostrils 2 (two) times daily.     Fluticasone Propionate, Inhal, 50 MCG/ACT AEPB Inhale into the lungs.     insulin aspart (NOVOLOG FLEXPEN) 100  UNIT/ML FlexPen 0-9 Units, Subcutaneous, 3 times daily with meals CBG < 70: Implement Hypoglycemia measures CBG 70 - 120: 0 units CBG 121 - 150: 1 unit CBG 151 - 200: 2 units CBG 201 - 250: 3 units CBG 251 - 300: 5 units CBG 301 - 350: 7 units CBG 351 - 400: 9 units CBG > 400: call MD 15 mL 0   insulin aspart protamine - aspart (NOVOLOG 70/30 FLEXPEN) (70-30) 100 UNIT/ML FlexPen Inject 100 Units into the skin 2 (two) times daily. Sliding Scale     insulin glargine-yfgn (SEMGLEE) 100 UNIT/ML injection Inject 0.2 mLs (20 Units total) into the skin at bedtime. 10 mL 11   levothyroxine (SYNTHROID) 88 MCG tablet Take 88 mcg by mouth daily before breakfast.     lidocaine (LIDODERM) 5 % Place 1 patch onto the skin daily. Remove & Discard patch within 12 hours or as directed by MD (Patient taking differently: Place 1 patch onto the skin daily. To right hip/thigh every 12 hours) 30 patch 0   losartan (COZAAR) 50 MG tablet Take 50 mg by mouth See admin instructions. 50 mg once daily. HOLD for sBP < 100.     melatonin 3 MG TABS tablet Take 1 tablet (3 mg total) by mouth at bedtime.  0   meloxicam (MOBIC) 7.5 MG tablet Take 1 tablet (7.5 mg total) by mouth daily. 15 tablet 0   metFORMIN (GLUCOPHAGE) 500 MG tablet Take 500 mg by mouth 2 (two) times daily.     methocarbamol (ROBAXIN) 500 MG tablet Take 1 tablet (500 mg total) by mouth 2 (two) times daily. 20 tablet 0   Multiple Vitamins-Minerals (ADULT GUMMY PO) Take 1 capsule by mouth daily.     niacin (VITAMIN B3) 500 MG tablet Take 1,000 mg by mouth at bedtime.     nortriptyline (PAMELOR) 50 MG capsule Take 2 capsules (100 mg total) by mouth at bedtime.     pantoprazole (PROTONIX) 40 MG tablet Take 40 mg by mouth 2 (two) times daily.     predniSONE (DELTASONE) 20 MG tablet Take 1 tablet (20 mg total) by mouth daily  with breakfast. 30 tablet 3   pyridostigmine (MESTINON) 60 MG tablet Take half-tablet at 10a and 4pm. (Patient taking differently: Take 30 mg by  mouth 2 (two) times daily at 10 am and 4 pm.) 90 tablet 5   sodium chloride (OCEAN) 0.65 % SOLN nasal spray Place 1 spray into both nostrils every 2 (two) hours as needed for congestion (dry nasal passage).     spironolactone (ALDACTONE) 25 MG tablet Take 1 tablet (25 mg total) by mouth daily. 30 tablet 11   No current facility-administered medications on file prior to visit.    Allergies:  Allergies  Allergen Reactions   Crestor [Rosuvastatin Calcium] Other (See Comments)    Caused Myalgias   Effexor [Venlafaxine Hydrochloride] Other (See Comments) and Hypertension    Caused BP to go up   Aricept [Donepezil]     Other reaction(s): diarrhea   Cream Base Other (See Comments)    SOUR CREAM ONLY   Latex     Other reaction(s): rash   Nardil [Phenelzine]     Other reaction(s): HTN   Pork-Derived Products    Hibiclens [Chlorhexidine Gluconate] Hives   Lipitor [Atorvastatin Calcium] Rash   Lisinopril Rash    Other reaction(s): rash   Penicillins Swelling and Other (See Comments)    Tolerated Cephalosporin Date: 05/01/22     Plavix [Clopidogrel Bisulfate] Rash   Simvastatin Rash    Other reaction(s): rash   Sulfa Antibiotics Rash   Sulfonamide Derivatives Rash   Tape Rash    Other reaction(s): rash    Vital Signs:  BP (!) 140/83   Pulse (!) 104   Ht 5' 1.25" (1.556 m)   Wt 147 lb (66.7 kg)   SpO2 99%   BMI 27.55 kg/m     Neurological Exam: MENTAL STATUS including orientation to time, place, person, recent and remote memory, attention span and concentration, language, and fund of knowledge is normal.  Speech is better, only trace dysarthria.  CRANIAL NERVES:   Pupils equal round and reactive to light.  Normal conjugate, extra-ocular eye movements in all directions of gaze. No ptosis at rest or with sustained upgaze .  Face is symmetric.  Oribicularis oculi is 5/5.  Buccinator is 5-/5. Palate elevates symmetrically.  Tongue is midline and strength is 5/5.   MOTOR:  Motor  strength is 5/5 in all extremities, there pain-limiting weakness of the left proximal arm with deltoid testing.    No pronator drift.  Tone is normal.    COORDINATION/GAIT:  Gait not tested today, patient arrived in wheelchair.    Data: CT head 05/29/2022:  negative  Lab Results  Component Value Date   TSH 5.134 (H) 05/27/2022    Lab Results  Component Value Date   HGBA1C 7.4 (H) 11/15/2022   CT lumbar spine wo contrast 11/11/2022: 1. No acute osseous injury within the lumbar spine. 2. 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 3. 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  Total time spent reviewing records, interview, history/exam, documentation, and coordination of care on day of encounter:  20 min   Thank you for allowing me to participate in patient's care.  If I can answer any additional questions, I would be pleased to do so.    Sincerely,    Omer Monter K. Allena Katz, DO

## 2023-07-30 ENCOUNTER — Ambulatory Visit: Payer: HMO

## 2023-07-30 VITALS — BP 147/80 | HR 99 | Temp 97.8°F | Resp 18 | Ht 61.25 in | Wt 148.4 lb

## 2023-07-30 DIAGNOSIS — G7001 Myasthenia gravis with (acute) exacerbation: Secondary | ICD-10-CM

## 2023-07-30 MED ORDER — IMMUNE GLOBULIN (HUMAN) 5 GM/50ML IV SOLN
65.0000 g | Freq: Once | INTRAVENOUS | Status: AC
  Administered 2023-07-30: 65 g via INTRAVENOUS

## 2023-07-30 MED ORDER — ACETAMINOPHEN 325 MG PO TABS
650.0000 mg | ORAL_TABLET | Freq: Once | ORAL | Status: AC
  Administered 2023-07-30: 650 mg via ORAL
  Filled 2023-07-30: qty 2

## 2023-07-30 MED ORDER — DIPHENHYDRAMINE HCL 25 MG PO CAPS
25.0000 mg | ORAL_CAPSULE | Freq: Once | ORAL | Status: AC
  Administered 2023-07-30: 25 mg via ORAL
  Filled 2023-07-30: qty 1

## 2023-07-30 NOTE — Progress Notes (Signed)
Diagnosis: Myasthenia gravis with exacerbation   Provider:  Chilton Greathouse MD  Procedure: IV Infusion  IV Type: Peripheral, IV Location: L Antecubital  IVIG (Immune Globulin), Dose: 65 g  Infusion Start Time: 0932  Infusion Stop Time: 1425  Post Infusion IV Care: Patient declined observation and Peripheral IV Discontinued  Discharge: Condition: Good, Destination: Home . AVS Provided  Performed by:  Rico Ala, LPN

## 2023-08-20 ENCOUNTER — Ambulatory Visit: Payer: HMO

## 2023-08-20 VITALS — BP 145/78 | HR 94 | Temp 98.0°F | Resp 16 | Ht 61.25 in | Wt 150.6 lb

## 2023-08-20 DIAGNOSIS — G7001 Myasthenia gravis with (acute) exacerbation: Secondary | ICD-10-CM

## 2023-08-20 MED ORDER — ACETAMINOPHEN 325 MG PO TABS
650.0000 mg | ORAL_TABLET | Freq: Once | ORAL | Status: AC
  Administered 2023-08-20: 650 mg via ORAL
  Filled 2023-08-20: qty 2

## 2023-08-20 MED ORDER — DEXTROSE 5 % IV SOLN
INTRAVENOUS | Status: DC
Start: 2023-08-20 — End: 2023-08-20

## 2023-08-20 MED ORDER — IMMUNE GLOBULIN (HUMAN) 5 GM/50ML IV SOLN
65.0000 g | Freq: Once | INTRAVENOUS | Status: AC
  Administered 2023-08-20: 65 g via INTRAVENOUS

## 2023-08-20 MED ORDER — DIPHENHYDRAMINE HCL 25 MG PO CAPS
25.0000 mg | ORAL_CAPSULE | Freq: Once | ORAL | Status: AC
  Administered 2023-08-20: 25 mg via ORAL
  Filled 2023-08-20: qty 1

## 2023-08-20 NOTE — Progress Notes (Signed)
Diagnosis: Myasthenia Gravis  Provider:  Chilton Greathouse MD  Procedure: IV Infusion  IV Type: Peripheral, IV Location: L Antecubital  Privigen (Immune Globulin), Dose: 65G  Infusion Start Time: 0935  Infusion Stop Time: 1437  Post Infusion IV Care: Observation period completed and Peripheral IV Discontinued  Discharge: Condition: Good, Destination: Home . AVS Provided  Performed by:  Garnette Czech, RN

## 2023-09-03 ENCOUNTER — Ambulatory Visit (INDEPENDENT_AMBULATORY_CARE_PROVIDER_SITE_OTHER): Payer: HMO

## 2023-09-03 DIAGNOSIS — I495 Sick sinus syndrome: Secondary | ICD-10-CM | POA: Diagnosis not present

## 2023-09-03 LAB — CUP PACEART REMOTE DEVICE CHECK
Battery Impedance: 2274 Ohm
Battery Remaining Longevity: 35 mo
Battery Voltage: 2.75 V
Brady Statistic AP VP Percent: 0 %
Brady Statistic AP VS Percent: 1 %
Brady Statistic AS VP Percent: 0 %
Brady Statistic AS VS Percent: 99 %
Date Time Interrogation Session: 20241127080217
Implantable Lead Connection Status: 753985
Implantable Lead Connection Status: 753985
Implantable Lead Implant Date: 20070710
Implantable Lead Implant Date: 20070710
Implantable Lead Location: 753859
Implantable Lead Location: 753860
Implantable Lead Model: 4469
Implantable Lead Model: 4470
Implantable Lead Serial Number: 483166
Implantable Lead Serial Number: 541525
Implantable Pulse Generator Implant Date: 20130410
Lead Channel Impedance Value: 578 Ohm
Lead Channel Impedance Value: 672 Ohm
Lead Channel Pacing Threshold Amplitude: 0.625 V
Lead Channel Pacing Threshold Amplitude: 0.75 V
Lead Channel Pacing Threshold Pulse Width: 0.4 ms
Lead Channel Pacing Threshold Pulse Width: 0.4 ms
Lead Channel Setting Pacing Amplitude: 2 V
Lead Channel Setting Pacing Amplitude: 2.5 V
Lead Channel Setting Pacing Pulse Width: 0.4 ms
Lead Channel Setting Sensing Sensitivity: 5.6 mV
Zone Setting Status: 755011
Zone Setting Status: 755011

## 2023-09-10 ENCOUNTER — Ambulatory Visit: Payer: HMO

## 2023-09-10 VITALS — BP 166/99 | HR 109 | Temp 97.7°F | Resp 20 | Ht 61.25 in | Wt 150.8 lb

## 2023-09-10 DIAGNOSIS — G7001 Myasthenia gravis with (acute) exacerbation: Secondary | ICD-10-CM

## 2023-09-10 MED ORDER — ACETAMINOPHEN 325 MG PO TABS
650.0000 mg | ORAL_TABLET | Freq: Once | ORAL | Status: AC
  Administered 2023-09-10: 650 mg via ORAL
  Filled 2023-09-10: qty 2

## 2023-09-10 MED ORDER — IMMUNE GLOBULIN (HUMAN) 5 GM/50ML IV SOLN
65.0000 g | Freq: Once | INTRAVENOUS | Status: AC
  Administered 2023-09-10: 65 g via INTRAVENOUS

## 2023-09-10 MED ORDER — DIPHENHYDRAMINE HCL 25 MG PO CAPS
25.0000 mg | ORAL_CAPSULE | Freq: Once | ORAL | Status: AC
  Administered 2023-09-10: 25 mg via ORAL
  Filled 2023-09-10: qty 1

## 2023-09-10 NOTE — Progress Notes (Signed)
Diagnosis: Myasthenia Gravis  Provider:  Chilton Greathouse MD  Procedure: IV Infusion  IV Type: Peripheral, IV Location: L Antecubital  Privigen (Immune Globulin), Dose: 65g  Infusion Start Time: 0940  Infusion Stop Time: 1443  Post Infusion IV Care: Peripheral IV Discontinued  Discharge: Condition: Good, Destination: Home . AVS Provided  Performed by:  Nat Math, RN

## 2023-10-02 ENCOUNTER — Ambulatory Visit: Payer: HMO

## 2023-10-02 VITALS — BP 131/82 | HR 94 | Temp 98.7°F | Resp 16 | Ht 61.25 in | Wt 151.6 lb

## 2023-10-02 DIAGNOSIS — G7001 Myasthenia gravis with (acute) exacerbation: Secondary | ICD-10-CM

## 2023-10-02 MED ORDER — DIPHENHYDRAMINE HCL 25 MG PO CAPS
25.0000 mg | ORAL_CAPSULE | Freq: Once | ORAL | Status: AC
  Administered 2023-10-02: 25 mg via ORAL
  Filled 2023-10-02: qty 1

## 2023-10-02 MED ORDER — DEXTROSE 5 % IV SOLN: INTRAVENOUS | Status: DC

## 2023-10-02 MED ORDER — IMMUNE GLOBULIN (HUMAN) 5 GM/50ML IV SOLN
65.0000 g | Freq: Once | INTRAVENOUS | Status: AC
  Administered 2023-10-02: 65 g via INTRAVENOUS

## 2023-10-02 MED ORDER — ACETAMINOPHEN 325 MG PO TABS
650.0000 mg | ORAL_TABLET | Freq: Once | ORAL | Status: AC
  Administered 2023-10-02: 650 mg via ORAL
  Filled 2023-10-02: qty 2

## 2023-10-02 NOTE — Progress Notes (Signed)
Diagnosis: Myasthenia Gravis  Provider:  Chilton Greathouse MD  Procedure: IV Infusion  IV Type: Peripheral, IV Location: L Antecubital  IVIG (Immune Globulin), Dose: 65 g   Infusion Start Time: 0950  Infusion Stop Time: 1500  Post Infusion IV Care: Peripheral IV Discontinued  Discharge: Condition: Good, Destination: Home . AVS Provided  Performed by:  Rico Ala, LPN

## 2023-10-07 ENCOUNTER — Other Ambulatory Visit: Payer: Self-pay

## 2023-10-09 ENCOUNTER — Telehealth: Payer: Self-pay

## 2023-10-09 NOTE — Telephone Encounter (Signed)
 Auth Submission: NO AUTH NEEDED Site of care: Site of care: [facility] Payer: [employer] Medication & CPT/J Code(s) submitted: Privigen  (IVIG) J1459 Route of submission (phone, fax, portal): phone Phone # [phone] Fax # Auth type: Buy/Bill PB Units/visits requested: 65mg  x 17 doses Reference number: [id number] and [id number] Approval from: 10/09/23 to 10/06/24   Call [employer] and confirmed with Darryle that as long as her coverage was in network with us  that a prior auth was not needed. Haley transferred me to Vishal K. Who confirmed that we are in network. Ref #[id number] for that call.

## 2023-10-22 ENCOUNTER — Ambulatory Visit: Payer: HMO | Admitting: Neurology

## 2023-10-22 ENCOUNTER — Encounter: Payer: Self-pay | Admitting: Neurology

## 2023-10-22 VITALS — BP 144/78 | HR 96 | Ht 61.25 in | Wt 154.0 lb

## 2023-10-22 DIAGNOSIS — G7001 Myasthenia gravis with (acute) exacerbation: Secondary | ICD-10-CM | POA: Diagnosis not present

## 2023-10-22 NOTE — Progress Notes (Signed)
 Follow-up Visit   Date: 10/22/2023    Heidi [name] MRN: [id number] DOB: 1941-08-05    Heidi [name] is a 83 y.o. 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  The patient was accompanied to the clinic by self.     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  Symptoms initially presented with dysarthria, diplopia, ptosis, and dysphagia. AChR and MUSK antibody negative, thymoma negative.  She is on a lower dose of mestinon  due to GI side effects.  She was hospitalized in February 2024 for MG exacerbation and received IVIG.  In May 2024, she was readmitted with DKA and MG exacerbation.  She reports blurred vision, double vision, and speech difficulty.  She wears prisms for many years and vision changes maybe contributed by this, as her myasthenia overall seems well controlled.  Speech sounds more strained and slurred, than nasal which is seen with MG.  Continue IVIG 1g/kg every 3 weeks Continue prenisone 15mg  daily Continue mestinon  30mg  twice daily at 10a and 4p Continue PT  Return to clinic in 3 months  --------------------------------------------- UPDATE 07/17/2022:  She was hospitalized in August for falls and hallucinations, found to be associated with her dementia.  She was discharged to rehab and now back at [facility].  She was falling frequently prior to her hospitalization, but reports no falls over the past 2 months.  Since being home, she has noticed difficulty with speaking and words being slurred, especially as the day progresses.  She continues to have intermittent diplopia and wears prisms.  Eyelids still can be droopy, but slightly better than before.  Sometimes, her food gets stuck in her throat.  No arms or leg weakness.   She ha spells of visual distortion,  described as if a jug-saw puzzle pieces are moving.  This occurs most days of the week.  She also reports having headaches at the left base of her head.   UPDATE 07/31/2022:  She started mestinon  60mg  1 tablet at 9am and 2pm.  She has noticed that about 4-5 hours after she takes the tablet, her speech starts to get slurred and double vision returns.  She has some difficulty swallowing her morning medications.  No problems with eating.  She also reports having spells of leg weakness and had to use a wheelchair at one point after playing bingo.   UPDATE 08/28/2022:    In October, she had EMG was consistent with myasthenia gravis and was started on prednisone  10mg  daily.  She noticed marked improvement in leg strength, swallowing, speech, and double vision.  She continues to have mild intermittent double vision and difficulty with getting words out.  No slurred speech.  She has been taking mestinon  60mg  three times daily, but reports having diarrhea with it.  It does help, because by around 5-6pm, she is feeling generalized weakness again.   UPDATE 10/30/2022:  She is here for follow-up visit.  She reports no significant change in her double vision or difficulty talking since her last visit, despite increasing prednisone  to 15mg  daily.  She also feels that her legs are weak and has started PT for balance.    Due to GI upset, she reduced mestinon  to 30mg  at 9am, 60mg  at 2pm, and 30mg  at 5pm, which has helped diarrhea, but she continues to have cramps.  Overall, she is not feeling well.   UPDATE 12/30/2022:  She is here for follow-up visit.  She was hospitalized in February because of increased weakness and falls and received IVIG x 5 days.  She feels it may have stabilized her symptoms, but still feels weak and takes her medications with applesauce.  She continues to have mild slurred speech.  She walks with a walker even for short distances.  She will be getting home PT/OT.    UPDATE 03/24/2023:  She is here for  follow-up visit.  She was hospitalized in May due to DKA which daughter says was due to patient eating soft foods and drinking sweet due because of dysphagia.  While hospitalized she received a dose of IVIG because of generalized weakness.  Patient feels that IVIG helps, but by week 3 her symptoms start to get worse again.  She is due for infusion as her last one was in May when hospitalized. She has since moved to SNF at [facility] in [place].  Unfortunately, her SNF does not allow infusion at the facility. She remains on prednisone  20mg /d and mestinon  30mg  twice daily.  She still has weakness in the legs, dysphagia, and mild dysarthria.  She remains on mechanical soft diet and can use a walker with one-person assist, but does not do this very often.   UPDATE 05/13/2023:  She is here for follow-up visit. She has been getting IVIG every 3 weeks for he last two infusions and reports this has made a marked improvement because she is staying stronger for longer. Over the last 3-4 days, she has noticed a significant improvement in overall strength.  She is still using a wheelchair because she does not feel strong enough to use a walker.  She is able to transfer independently.  She is able to bathe herself, but needs assistance with getting in and out of the shower.  She has mild slurred speech when tired, double vision at end-gaze, and generalized fatigue.  She does feel that her last IVIG helped and she has felt the best in a few weeks.   She also complains of redness over the left upper arm which has been present for the past 5 days.  It does not itch or cause any pain.   UPDATE 07/22/2023:  She is here for follow-up visit. She continues to take prednisone  20mg  daily and mestinon  30mg  twice daily at 10a and 4pm.  She is also getting IVIG every 3 weeks and tolerating this well.  She has noticed that she has improved strength which lasts longer, typically she starts to get week about 3 days prior to the next  infusion.  She still has some double vision, difficulty with speech/swallow, and leg weakness, but overall is feeling much better.  She is reliant on a wheelchair because her legs feel weak. PT was started last week.   UPDATE 10/22/2023:  She is here for follow-up visit.  She reports having improved overall strength with the last two infusions and was able to travel in and out of the car much better during the holidays.  She is able to stand and walk about 40 feet with a rollator.  She continues to have blurred vision, some double vision, and speech problems. She has seen her eye doctor and had recent change to prescription as well as prisms.  She has been wearing prisms for many years.  Overall, she is feeling much better.  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    Medications:  Current Outpatient Medications on File Prior to Visit  Medication Sig Dispense Refill   acetaminophen  (TYLENOL ) 325 MG tablet Take 2 tablets (650 mg total) by mouth every 6 (six) hours as needed for mild pain (or Fever >/= 101). (Patient taking differently: Take 650 mg by mouth See admin instructions. 2 entries on MAR: 1) 650 mg three times daily (scheduled) 2) 650 mg every 6 hours as needed for pain)     ASPIRIN  LOW DOSE 81 MG chewable tablet Chew 81 mg by mouth daily.     Cholecalciferol  (VITAMIN D3) 125 MCG (5000 UT) CAPS Take 5,000 Units by mouth daily.     cholestyramine  (QUESTRAN ) 4 g packet Take 4 g by mouth See admin instructions. 2 entries on MAR: 1) 4g once daily. 2) 4g twice daily as needed for loose stool.     cloNIDine (CATAPRES) 0.1 MG tablet Take 0.1 mg by mouth 3 (three) times daily.     cyanocobalamin  1000 MCG tablet Take 1 tablet (1,000 mcg total) by mouth daily.     ezetimibe  (ZETIA ) 10 MG tablet Take 10 mg by mouth daily.     ferrous sulfate  325 (65 FE) MG tablet Take 325 mg by mouth daily.     fexofenadine  (ALLEGRA) 180 MG tablet Take 180 mg by mouth daily.     fluticasone  (FLONASE ) 50 MCG/ACT nasal spray Place 1 spray into both nostrils 2 (two) times daily.     Fluticasone  Propionate, Inhal, 50 MCG/ACT AEPB Inhale 1 spray into the lungs as needed.     furosemide  (LASIX ) 20 MG tablet TAKE 1 TABLET BY MOUTH 3 TIMES A WEEK. (Patient taking differently: Take 20 mg by mouth every Monday, Wednesday, and Friday.) 45 tablet 3   galantamine  (RAZADYNE  ER) 8 MG 24 hr capsule Take 4 mg by mouth daily.  2   hydrOXYzine  (ATARAX /VISTARIL ) 25 MG tablet Take 25 mg by mouth at bedtime.     insulin  aspart (NOVOLOG  FLEXPEN) 100 UNIT/ML FlexPen 0-9 Units, Subcutaneous, 3 times daily with meals CBG < 70: Implement Hypoglycemia measures CBG 70 - 120: 0 units CBG 121 - 150: 1 unit CBG 151 - 200: 2 units CBG 201 - 250: 3 units CBG 251 - 300: 5 units CBG 301 - 350: 7 units CBG 351 - 400: 9 units CBG > 400: call MD 15 mL 0   insulin  aspart protamine - aspart (NOVOLOG  70/30 FLEXPEN) (70-30) 100 UNIT/ML FlexPen Inject 100 Units into the skin 2 (two) times daily. Sliding Scale     levothyroxine  (SYNTHROID ) 88 MCG tablet Take 88 mcg by mouth daily before breakfast.     lidocaine  (LIDODERM ) 5 % Place 1 patch onto the skin daily. Remove & Discard patch within 12 hours or as directed by MD 30 patch 0   losartan (COZAAR) 50 MG tablet Take 50 mg by mouth See admin instructions. 50 mg once daily. HOLD for sBP < 100.     melatonin 3 MG TABS tablet Take 1 tablet (3 mg total) by mouth at bedtime.  0   Menthol , Topical Analgesic, (BIOFREEZE COOL THE PAIN) 4 % GEL Apply topically.     metFORMIN  (GLUCOPHAGE ) 500 MG tablet Take 500 mg by mouth 2 (two) times daily.     nortriptyline  (PAMELOR ) 50 MG capsule Take 2 capsules (100 mg total) by mouth at bedtime.     pantoprazole  (PROTONIX ) 40 MG tablet Take 40 mg by mouth 2 (two) times  daily.     predniSONE  (DELTASONE ) 10 MG tablet Take 1.5 tablets (15 mg total) by mouth daily with breakfast. 45  tablet 5   pregabalin (LYRICA) 75 MG capsule Take 75 mg by mouth 3 (three) times daily.     pyridostigmine  (MESTINON ) 60 MG tablet Take half-tablet at 10a and 4pm. (Patient taking differently: Take 30 mg by mouth 2 (two) times daily at 10 am and 4 pm.) 90 tablet 5   spironolactone  (ALDACTONE ) 25 MG tablet Take 1 tablet (25 mg total) by mouth daily. 30 tablet 11   fenofibrate  160 MG tablet Take 160 mg by mouth daily. (Patient not taking: Reported on 10/22/2023)     gabapentin  (NEURONTIN ) 300 MG capsule Take 300 mg by mouth 3 (three) times daily. (Patient not taking: Reported on 10/22/2023)     insulin  glargine-yfgn (SEMGLEE ) 100 UNIT/ML injection Inject 0.2 mLs (20 Units total) into the skin at bedtime. (Patient not taking: Reported on 10/22/2023) 10 mL 11   LANTUS  SOLOSTAR 100 UNIT/ML Solostar Pen Inject into the skin. (Patient not taking: Reported on 10/22/2023)     meloxicam  (MOBIC ) 7.5 MG tablet Take 1 tablet (7.5 mg total) by mouth daily. (Patient not taking: Reported on 07/22/2023) 15 tablet 0   methocarbamol  (ROBAXIN ) 500 MG tablet Take 1 tablet (500 mg total) by mouth 2 (two) times daily. (Patient not taking: Reported on 10/22/2023) 20 tablet 0   Multiple Vitamins-Minerals (ADULT GUMMY PO) Take 1 capsule by mouth daily. (Patient not taking: Reported on 07/22/2023)     niacin  (VITAMIN B3) 500 MG tablet Take 1,000 mg by mouth at bedtime. (Patient not taking: Reported on 07/22/2023)     sodium chloride  (OCEAN) 0.65 % SOLN nasal spray Place 1 spray into both nostrils every 2 (two) hours as needed for congestion (dry nasal passage). (Patient not taking: Reported on 07/22/2023)     No current facility-administered medications on file prior to visit.    Allergies:  Allergies  Allergen Reactions   Crestor [Rosuvastatin Calcium] Other (See Comments)    Caused Myalgias   Effexor [Venlafaxine Hydrochloride] Other (See Comments) and Hypertension    Caused BP to go up   Aricept  [Donepezil ]     Other  reaction(s): diarrhea   Cream Base Other (See Comments)    SOUR CREAM ONLY   Latex     Other reaction(s): rash   Nardil [Phenelzine]     Other reaction(s): HTN   Pork-Derived Products    Hibiclens  [Chlorhexidine  Gluconate] Hives   Lipitor [Atorvastatin Calcium] Rash   Lisinopril Rash    Other reaction(s): rash   Penicillins Swelling and Other (See Comments)    Tolerated Cephalosporin Date: 05/01/22     Plavix [Clopidogrel Bisulfate] Rash   Simvastatin Rash    Other reaction(s): rash   Sulfa Antibiotics Rash   Sulfonamide Derivatives Rash   Tape Rash    Other reaction(s): rash    Vital Signs:  BP (!) 144/78   Pulse 96   Ht 5' 1.25" (1.556 m)   Wt 154 lb (69.9 kg)   SpO2 98%   BMI 28.86 kg/m     Neurological Exam: MENTAL STATUS including orientation to time, place, person, recent and remote memory, attention span and concentration, language, and fund of knowledge is normal.  Speech is show mild dysarthria.  CRANIAL NERVES:   Pupils equal round and reactive to light.  Normal conjugate, extra-ocular eye movements in all directions of gaze. No ptosis at rest or with sustained upgaze .  Face is symmetric.    MOTOR:  Motor strength is 5/5 in all extremities.  She is able to stand with pushing off, which is markedly improved.  No pronator drift.  Tone is normal.    COORDINATION/GAIT:  Gait not tested today, patient arrived in wheelchair.    Data: CT head 05/29/2022:  negative  Lab Results  Component Value Date   TSH 5.134 (H) 05/27/2022    Lab Results  Component Value Date   HGBA1C 7.4 (H) 11/15/2022   CT lumbar spine wo contrast 11/11/2022: 1. No acute osseous injury within the lumbar spine. 2. 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 3. 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  Total time spent reviewing records, interview, history/exam, documentation, and coordination of  care on day of encounter:  30 min   Thank you for allowing me to participate in patient's care.  If I can answer any additional questions, I would be pleased to do so.    Sincerely,    Benjamyn Hestand K. Lydia Sams, DO

## 2023-10-23 ENCOUNTER — Ambulatory Visit: Payer: HMO

## 2023-10-23 VITALS — BP 173/95 | HR 88 | Temp 97.9°F | Resp 18 | Ht 61.25 in | Wt 152.2 lb

## 2023-10-23 DIAGNOSIS — G7001 Myasthenia gravis with (acute) exacerbation: Secondary | ICD-10-CM | POA: Diagnosis not present

## 2023-10-23 MED ORDER — ACETAMINOPHEN 325 MG PO TABS
650.0000 mg | ORAL_TABLET | Freq: Once | ORAL | Status: AC
  Administered 2023-10-23: 650 mg via ORAL
  Filled 2023-10-23: qty 2

## 2023-10-23 MED ORDER — DEXTROSE 5 % IV SOLN
INTRAVENOUS | Status: DC
Start: 2023-10-23 — End: 2023-10-23

## 2023-10-23 MED ORDER — DIPHENHYDRAMINE HCL 25 MG PO CAPS
25.0000 mg | ORAL_CAPSULE | Freq: Once | ORAL | Status: AC
  Administered 2023-10-23: 25 mg via ORAL
  Filled 2023-10-23: qty 1

## 2023-10-23 MED ORDER — IMMUNE GLOBULIN (HUMAN) 5 GM/50ML IV SOLN
65.0000 g | Freq: Once | INTRAVENOUS | Status: AC
  Administered 2023-10-23: 65 g via INTRAVENOUS

## 2023-10-23 NOTE — Progress Notes (Signed)
Diagnosis: Myasthenia Gravis  Provider:  Chilton Greathouse MD  Procedure: IV Infusion  IV Type: Peripheral, IV Location: L Antecubital  IVIG (Immune Globulin), Dose: 65g  Infusion Start Time: 1023  Infusion Stop Time: 1526  Post Infusion IV Care: Peripheral IV Discontinued  Discharge: Condition: Good, Destination: Home . AVS Provided  Performed by:  Nat Math, RN

## 2023-10-26 IMAGING — CT CT SHOULDER*R* W/O CM
1 of 3 series · 7 of 14 positions shown, 9 images · non-contrast
Comparison: Radiographs 01/07/2022

CLINICAL DATA: Right shoulder dislocation 01/07/2022. Preoperative
assessment.

EXAM:
CT OF THE UPPER RIGHT EXTREMITY WITHOUT CONTRAST
TECHNIQUE: Multidetector CT imaging of the right shoulder was performed
according to the standard protocol.
RADIATION DOSE REDUCTION: This exam was performed according to the
departmental dose-optimization program which includes automated
exposure control, adjustment of the mA and/or kV according to
patient size and/or use of iterative reconstruction technique.

[Series 7: thin soft (person_name) · axial · 0.56mm/px · z∈[-253,-91]mm · 7 of 360 slices shown, 9 images]
[im 45/360  soft-tissue]
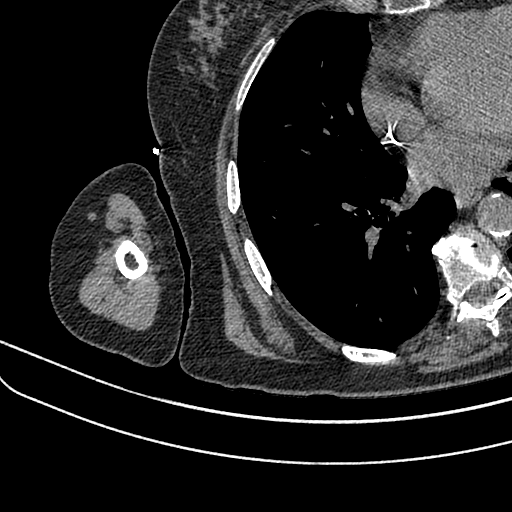
[im 45/360  bone]
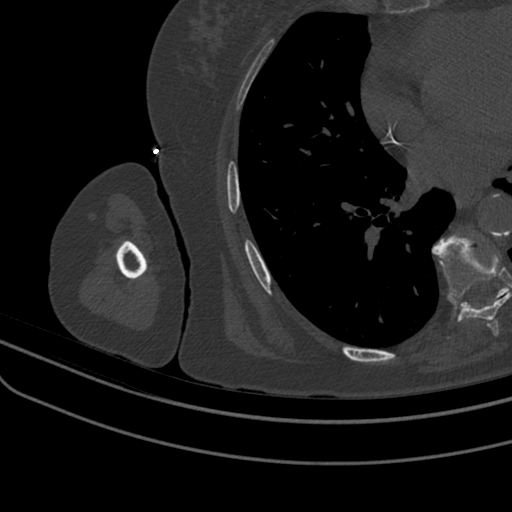
[im 90/360  bone]
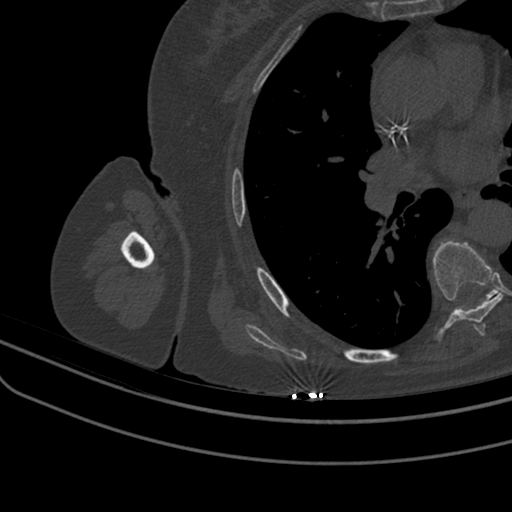
[im 135/360  bone]
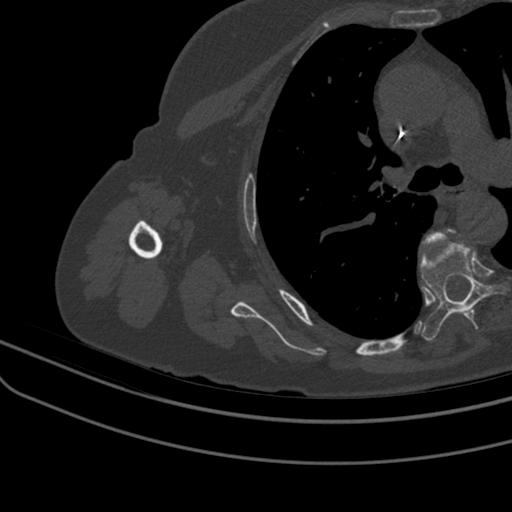
[im 180/360  bone]
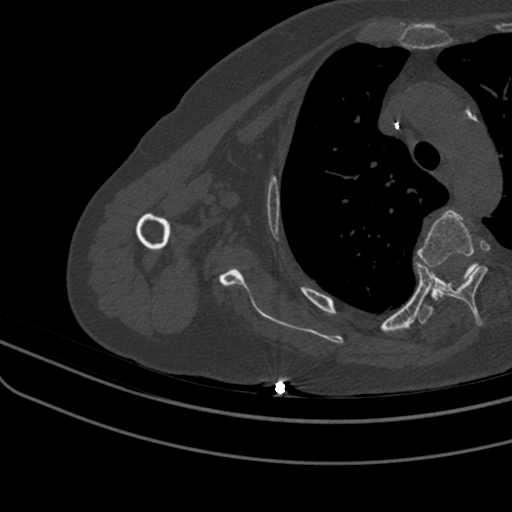
[im 225/360  soft-tissue]
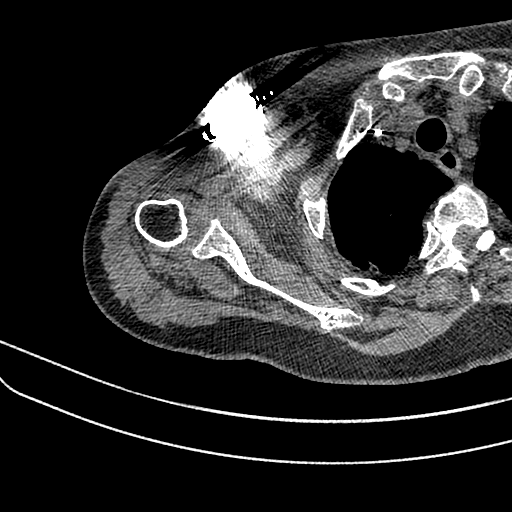
[im 225/360  bone]
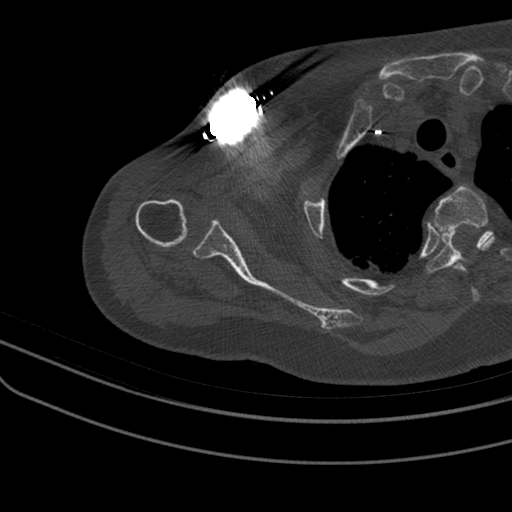
[im 270/360  bone]
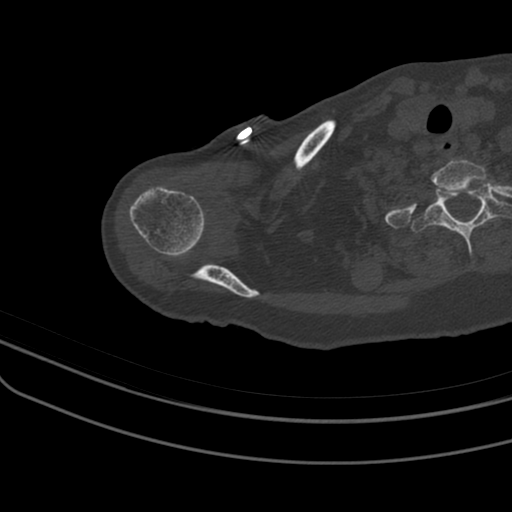
[im 315/360  bone]
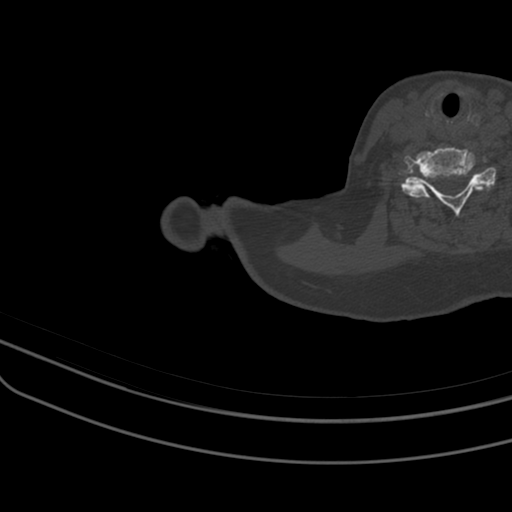

[7 of 14 positions shown; findings below may reference images not displayed]

FINDINGS: Bones/Joint/Cartilage

No evidence of acute fracture or dislocation. Specifically, no
evidence of Hill-Sachs deformity of the humeral head or definite
osseous Bankart lesion. The humeral head is located. There is
significant narrowing of the subacromial space consistent with
rotator cuff impingement and a probable underlying rotator cuff
tear. No significant glenohumeral arthropathy. There is a moderate
size shoulder joint effusion with a small calcified loose body or
fracture fragment posteriorly in the joint (image 36/5).

Mild acromioclavicular degenerative changes. Degenerative changes in
the cervicothoracic spine.

Ligaments

Suboptimally assessed by CT.

Muscles and Tendons

As above, significant narrowing of the subacromial space consistent
with rotator cuff impingement and a probable underlying rotator cuff
tear. There is atrophy of the supraspinatus and infraspinatus
muscles.

Soft tissues

Right subclavian pacemaker noted. There is atherosclerosis of the
aorta, great vessels and coronary arteries. Mild scarring at the
right lung apex. No periarticular fluid collection or unexpected
foreign body identified.
IMPRESSION: 1. The humeral head is located status post recent shoulder
dislocation and reduction. No definite acute fracture or
dislocation, although there may be a small loose body or fracture
fragment posteriorly in the joint.
2. Significant narrowing of the subacromial space suspicious for
rotator cuff impingement and probable chronic rotator cuff tear.
Associated mild supraspinatus and infraspinatus muscular atrophy.
3. Coronary and Aortic Atherosclerosis ([id number]-HZO.O).

## 2023-11-12 ENCOUNTER — Ambulatory Visit (INDEPENDENT_AMBULATORY_CARE_PROVIDER_SITE_OTHER): Payer: HMO

## 2023-11-12 VITALS — BP 155/76 | HR 97 | Temp 98.1°F | Resp 18 | Ht 61.25 in | Wt 150.0 lb

## 2023-11-12 DIAGNOSIS — G7001 Myasthenia gravis with (acute) exacerbation: Secondary | ICD-10-CM | POA: Diagnosis not present

## 2023-11-12 MED ORDER — DEXTROSE 5 % IV SOLN: INTRAVENOUS | Status: DC

## 2023-11-12 MED ORDER — IMMUNE GLOBULIN (HUMAN) 5 GM/50ML IV SOLN
65.0000 g | Freq: Once | INTRAVENOUS | Status: AC
  Administered 2023-11-12: 65 g via INTRAVENOUS

## 2023-11-12 MED ORDER — DIPHENHYDRAMINE HCL 25 MG PO CAPS
25.0000 mg | ORAL_CAPSULE | Freq: Once | ORAL | Status: AC
  Administered 2023-11-12: 25 mg via ORAL
  Filled 2023-11-12: qty 1

## 2023-11-12 MED ORDER — ACETAMINOPHEN 325 MG PO TABS
650.0000 mg | ORAL_TABLET | Freq: Once | ORAL | Status: AC
  Administered 2023-11-12: 650 mg via ORAL
  Filled 2023-11-12: qty 2

## 2023-11-12 NOTE — Progress Notes (Signed)
 Diagnosis: Myasthenia gravis with exacerbation   Provider:  Praveen Mannam MD  Procedure: IV Infusion  IV Type: Peripheral, IV Location: L Antecubital  IVIG (Immune Globulin ),  65g  Infusion Start Time: 0909  Infusion Stop Time: 1454  Post Infusion IV Care: Peripheral IV Discontinued  Discharge: Condition: Good, Destination: Home . AVS Provided  Performed by:  Maximiano JONELLE Pouch, LPN

## 2023-11-28 ENCOUNTER — Encounter (HOSPITAL_COMMUNITY): Payer: Self-pay | Admitting: Emergency Medicine

## 2023-11-28 ENCOUNTER — Emergency Department (HOSPITAL_COMMUNITY): Payer: HMO

## 2023-11-28 ENCOUNTER — Emergency Department (HOSPITAL_COMMUNITY)
Admission: EM | Admit: 2023-11-28 | Discharge: 2023-11-28 | Disposition: A | Discharge status: Home / Self Care | Payer: HMO | Attending: Emergency Medicine | Admitting: Emergency Medicine

## 2023-11-28 ENCOUNTER — Other Ambulatory Visit: Payer: Self-pay

## 2023-11-28 DIAGNOSIS — I2510 Atherosclerotic heart disease of native coronary artery without angina pectoris: Secondary | ICD-10-CM | POA: Diagnosis not present

## 2023-11-28 DIAGNOSIS — E039 Hypothyroidism, unspecified: Secondary | ICD-10-CM | POA: Insufficient documentation

## 2023-11-28 DIAGNOSIS — I129 Hypertensive chronic kidney disease with stage 1 through stage 4 chronic kidney disease, or unspecified chronic kidney disease: Secondary | ICD-10-CM | POA: Diagnosis not present

## 2023-11-28 DIAGNOSIS — N189 Chronic kidney disease, unspecified: Secondary | ICD-10-CM | POA: Insufficient documentation

## 2023-11-28 DIAGNOSIS — M25511 Pain in right shoulder: Secondary | ICD-10-CM | POA: Insufficient documentation

## 2023-11-28 DIAGNOSIS — W19XXXA Unspecified fall, initial encounter: Secondary | ICD-10-CM | POA: Insufficient documentation

## 2023-11-28 DIAGNOSIS — E1142 Type 2 diabetes mellitus with diabetic polyneuropathy: Secondary | ICD-10-CM | POA: Insufficient documentation

## 2023-11-28 DIAGNOSIS — S2241XA Multiple fractures of ribs, right side, initial encounter for closed fracture: Secondary | ICD-10-CM | POA: Insufficient documentation

## 2023-11-28 DIAGNOSIS — S299XXA Unspecified injury of thorax, initial encounter: Secondary | ICD-10-CM | POA: Diagnosis present

## 2023-11-28 MED ORDER — LIDOCAINE 5 % EX OINT: 1.0000 | TOPICAL_OINTMENT | CUTANEOUS | 0 refills | Status: AC | PRN

## 2023-11-28 MED ORDER — ACETAMINOPHEN 500 MG PO TABS
1000.0000 mg | ORAL_TABLET | Freq: Once | ORAL | Status: AC
  Administered 2023-11-28: 1000 mg via ORAL
  Filled 2023-11-28: qty 2

## 2023-11-28 NOTE — ED Notes (Signed)
 [facility]tar called

## 2023-11-28 NOTE — ED Triage Notes (Signed)
Pt BIB EMS from [facility], pt had a witnessed fall at 7pm, states she was transferring to her wheelchair and it moved and she fell hitting her R shoulder. C/o R shoulder pain and R sided back pain with bruising noted to back. Also c/o mid abd pain that radiates to R side and wraps around to the R side of her back x 1 week. Hx of CHF with medication compliance.

## 2023-11-28 NOTE — ED Provider Notes (Signed)
Emergency Department Provider Note   I have reviewed the triage vital signs and the nursing notes.   HISTORY  Chief Complaint Fall   HPI Heidi [name] is a 83 y.o. female past history reviewed below presents emergency department for evaluation after mechanical fall.  Patient is a resident at [facility] and states that at 7 PM she had a mechanical fall while transferring from her wheelchair in the bathroom.  She states the break has not been working well and when she lifted herself to stand she lost her balance falling, striking her right chest on some furniture in the bathroom.  No head injury.  Is having some mild pain in the right shoulder but mainly discomfort in the right, lateral chest.  Some mild bruising is noted.  She is compliant with all medications and does not take any anticoagulant.  Denies any abdominal or midline back pain.    Past Medical History:  Diagnosis Date   Anxiety    Atrial fibrillation (HCC)    Intermittent   CAD (coronary artery disease)    non obstructive   CKD (chronic kidney disease)    Diabetes mellitus without complication (HCC)    Dysrhythmia    PAF   GERD (gastroesophageal reflux disease)    Glaucoma    Narrow Angle   Heart murmur    HTN (hypertension)    Hyperlipidemia    Hypothyroid    Major depression    10-17-2010: hospitalized for suicidal, Verneita Griffes, [place] (D/C 10-30-2010)   Migraine headache    OSA (obstructive sleep apnea)    CPAP nightly   Peripheral neuropathy    Presence of permanent cardiac pacemaker    Seasonal allergies    Sleep apnea    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  Respiratory: Denies shortness of breath. Gastrointestinal: No abdominal pain.  No nausea, no vomiting.   Musculoskeletal: Negative for back pain. Skin: Negative for rash. Neurological: Negative for  headaches.  ____________________________________________   PHYSICAL EXAM:  VITAL SIGNS: ED Triage Vitals  Encounter Vitals Group     BP 11/28/23 0041 (!) 173/94     Pulse Rate 11/28/23 0041 (!) 105     Resp 11/28/23 0041 16     Temp 11/28/23 0041 97.6 F (36.4 C)     Temp Source 11/28/23 0041 Temporal     SpO2 11/28/23 0041 96 %     Weight 11/28/23 0048 150 lb (68 kg)     Height 11/28/23 0048 5' 1.25" (1.556 m)   Constitutional: Alert and oriented. Well appearing and in no acute distress. Eyes: Conjunctivae are normal. Head: Atraumatic. Nose: No congestion/rhinnorhea. Mouth/Throat: Mucous membranes are moist.   Neck: No stridor.  No cervical spine tenderness to palpation. Cardiovascular: Normal rate, regular rhythm. Good peripheral circulation. Grossly normal heart sounds.   Respiratory: Normal respiratory effort.  No retractions. Lungs CTAB. Gastrointestinal: Soft and nontender. No distention.  Musculoskeletal: No lower extremity tenderness nor edema. No gross deformities of extremities. Point tenderness to the right lateral chest wall. No crepitus.  Neurologic:  Normal speech and language. No gross focal neurologic deficits are appreciated.  Skin:  Skin is warm, dry and intact. Small area bruising to the right lateral chest wall.   ____________________________________________  RADIOLOGY  DG Ribs Unilateral W/Chest Right Result Date: 11/28/2023 CLINICAL DATA:  Fall, right rib pain EXAM: RIGHT RIBS AND CHEST -  3+ VIEW COMPARISON:  Chest radiographs dated 02/21/2023 FINDINGS: Mild lingular scarring. Right lung is clear No pleural effusion or pneumothorax. The heart is normal in size. Thoracic aortic atherosclerosis. Right chest pacemaker. Right shoulder arthroplasty. Suspected nondisplaced right lateral 9th and 10th rib fractures. IMPRESSION: Suspected nondisplaced right lateral 9th and 10th rib fractures. No pneumothorax. Electronically Signed   By: Charline Bills M.D.   On:  11/28/2023 01:42   DG Shoulder Right Result Date: 11/28/2023 CLINICAL DATA:  fall EXAM: RIGHT SHOULDER - 2+ VIEW COMPARISON:  None Available. FINDINGS: Reversed total right shoulder arthroplasty. No radiographic finding of surgical hardware complication. There is no evidence of fracture or dislocation. There is no evidence of arthropathy or other focal bone abnormality. Soft tissues are unremarkable. Right chest wall cardiac pacemaker. IMPRESSION: Negative for acute traumatic injury. Electronically Signed   By: Tish Frederickson M.D.   On: 11/28/2023 01:41    ____________________________________________   PROCEDURES  Procedure(s) performed:   Procedures  None  ____________________________________________   INITIAL IMPRESSION / ASSESSMENT AND PLAN / ED COURSE  Pertinent labs & imaging results that were available during my care of the patient were reviewed by me and considered in my medical decision making (see chart for details).   This patient is Presenting for Evaluation of chest wall pain, which does require a range of treatment options, and is a complaint that involves a moderate risk of morbidity and mortality.  The Differential Diagnoses include contusion, sprain, fracture, PNX, etc.  Critical Interventions-    Medications  acetaminophen (TYLENOL) tablet 1,000 mg (1,000 mg Oral Given 11/28/23 0144)    Reassessment after intervention: pain improved.   Radiologic Tests Ordered, included CXR and shoulder XR. I independently interpreted the images and agree with radiology interpretation.   Medical Decision Making: Summary:  Patient presents to the emergency department after mechanical fall at her nursing facility.  Point tenderness to the right lateral chest wall without crepitus and some mild bruising.  X-ray shows ninth and 10th rib fractures without pneumothorax.  No hypoxemia or increased work of breathing.  She is awake, alert, comfortable.  Pain controlled well with Tylenol  only.  Plan to continue Tylenol along with lidocaine ointment as needed.  Incentive spirometry planned for home with close PCP follow up.  Patient's presentation is most consistent with acute presentation with potential threat to life or bodily function.   Disposition: discharge  ____________________________________________  FINAL CLINICAL IMPRESSION(S) / ED DIAGNOSES  Final diagnoses:  Fall, initial encounter  Closed fracture of multiple ribs of right side, initial encounter     NEW OUTPATIENT MEDICATIONS STARTED DURING THIS VISIT:  New Prescriptions   LIDOCAINE (XYLOCAINE) 5 % OINTMENT    Apply 1 Application topically as needed.    Note:  This document was prepared using Dragon voice recognition software and may include unintentional dictation errors.  Alona Bene, MD, [facility]    Ida Milbrath, Arlyss Repress, MD 11/28/23 [phone]

## 2023-11-28 NOTE — Discharge Instructions (Signed)
Your workup today showed that you have a fracture to one or more ribs.  Unfortunately this type of injury hurts but there is no way to fix it immediately; it must heal over time.  Be sure to take plenty of deep breaths so that you get rid of the "bad air" in your lungs.  If you are given a device called an incentive spirometer, please use it as recommended.  Unless you have been told by your doctor not to do so, we recommend you take  Tylenol 1000 mg every 6 hours for pain.  Follow-up at the clinics or with the doctors described in this paperwork.  Return to the emergency department if he develop new or worsening symptoms that concern you.

## 2023-12-03 ENCOUNTER — Ambulatory Visit (INDEPENDENT_AMBULATORY_CARE_PROVIDER_SITE_OTHER): Payer: Medicare Other

## 2023-12-03 ENCOUNTER — Ambulatory Visit (INDEPENDENT_AMBULATORY_CARE_PROVIDER_SITE_OTHER): Payer: HMO

## 2023-12-03 VITALS — BP 122/77 | HR 88 | Temp 97.5°F | Resp 16 | Ht 61.25 in | Wt 150.0 lb

## 2023-12-03 DIAGNOSIS — I495 Sick sinus syndrome: Secondary | ICD-10-CM

## 2023-12-03 DIAGNOSIS — G7001 Myasthenia gravis with (acute) exacerbation: Secondary | ICD-10-CM

## 2023-12-03 MED ORDER — IMMUNE GLOBULIN (HUMAN) 5 GM/50ML IV SOLN
65.0000 g | Freq: Once | INTRAVENOUS | Status: AC
  Administered 2023-12-03: 65 g via INTRAVENOUS

## 2023-12-03 MED ORDER — DEXTROSE 5 % IV SOLN: INTRAVENOUS | Status: DC

## 2023-12-03 MED ORDER — ACETAMINOPHEN 325 MG PO TABS
650.0000 mg | ORAL_TABLET | Freq: Once | ORAL | Status: AC
  Administered 2023-12-03: 650 mg via ORAL
  Filled 2023-12-03: qty 2

## 2023-12-03 MED ORDER — DIPHENHYDRAMINE HCL 25 MG PO CAPS
25.0000 mg | ORAL_CAPSULE | Freq: Once | ORAL | Status: AC
  Administered 2023-12-03: 25 mg via ORAL
  Filled 2023-12-03: qty 1

## 2023-12-03 NOTE — Progress Notes (Signed)
 Diagnosis: Myasthenia Gravis  Provider:  Chilton Greathouse MD  Procedure: IV Infusion  IV Type: Peripheral, IV Location: L Antecubital  IVIG (Immune Globulin), Dose: 65g  Infusion Start Time: 1003  Infusion Stop Time: 1525  Post Infusion IV Care: Peripheral IV Discontinued  Discharge: Condition: Good, Destination: Home . AVS Provided  Performed by:  Wyvonne Lenz, RN

## 2023-12-04 LAB — CUP PACEART REMOTE DEVICE CHECK
Battery Impedance: 2360 Ohm
Battery Remaining Longevity: 34 mo
Battery Voltage: 2.75 V
Brady Statistic AP VP Percent: 0 %
Brady Statistic AP VS Percent: 1 %
Brady Statistic AS VP Percent: 0 %
Brady Statistic AS VS Percent: 99 %
Date Time Interrogation Session: 20250226071808
Implantable Lead Connection Status: 753985
Implantable Lead Connection Status: 753985
Implantable Lead Implant Date: 20070710
Implantable Lead Implant Date: 20070710
Implantable Lead Location: 753859
Implantable Lead Location: 753860
Implantable Lead Model: 4469
Implantable Lead Model: 4470
Implantable Lead Serial Number: 483166
Implantable Lead Serial Number: 541525
Implantable Pulse Generator Implant Date: 20130410
Lead Channel Impedance Value: 603 Ohm
Lead Channel Impedance Value: 733 Ohm
Lead Channel Pacing Threshold Amplitude: 0.75 V
Lead Channel Pacing Threshold Amplitude: 0.75 V
Lead Channel Pacing Threshold Pulse Width: 0.4 ms
Lead Channel Pacing Threshold Pulse Width: 0.4 ms
Lead Channel Setting Pacing Amplitude: 2 V
Lead Channel Setting Pacing Amplitude: 2.5 V
Lead Channel Setting Pacing Pulse Width: 0.4 ms
Lead Channel Setting Sensing Sensitivity: 5.6 mV
Zone Setting Status: 755011
Zone Setting Status: 755011

## 2023-12-23 ENCOUNTER — Encounter: Payer: Self-pay | Admitting: Internal Medicine

## 2023-12-24 ENCOUNTER — Ambulatory Visit (INDEPENDENT_AMBULATORY_CARE_PROVIDER_SITE_OTHER): Payer: HMO

## 2023-12-24 VITALS — BP 128/71 | HR 100 | Temp 98.0°F | Resp 18 | Ht 61.25 in | Wt 149.0 lb

## 2023-12-24 DIAGNOSIS — G7001 Myasthenia gravis with (acute) exacerbation: Secondary | ICD-10-CM | POA: Diagnosis not present

## 2023-12-24 MED ORDER — ACETAMINOPHEN 325 MG PO TABS
650.0000 mg | ORAL_TABLET | Freq: Once | ORAL | Status: AC
  Administered 2023-12-24: 650 mg via ORAL
  Filled 2023-12-24: qty 2

## 2023-12-24 MED ORDER — IMMUNE GLOBULIN (HUMAN) 5 GM/50ML IV SOLN
65.0000 g | Freq: Once | INTRAVENOUS | Status: AC
  Administered 2023-12-24: 65 g via INTRAVENOUS

## 2023-12-24 MED ORDER — DEXTROSE 5 % IV SOLN: INTRAVENOUS | Status: DC

## 2023-12-24 MED ORDER — DIPHENHYDRAMINE HCL 25 MG PO CAPS
25.0000 mg | ORAL_CAPSULE | Freq: Once | ORAL | Status: AC
  Administered 2023-12-24: 25 mg via ORAL
  Filled 2023-12-24: qty 1

## 2023-12-24 NOTE — Progress Notes (Signed)
 Diagnosis: Myasthenia Gravis  Provider:  Chilton Greathouse MD  Procedure: IV Infusion  IV Type: Peripheral, IV Location: R Forearm  IVIG (Immune Globulin), Dose: 65G  Infusion Start Time: 0951  Infusion Stop Time: 1447  Post Infusion IV Care: Patient declined observation and Peripheral IV Discontinued  Discharge: Condition: Good, Destination: Skilled nursing facility . AVS Provided  Performed by:  Garnette Czech, RN

## 2024-01-06 NOTE — Progress Notes (Signed)
 Remote pacemaker transmission.

## 2024-01-06 NOTE — Addendum Note (Signed)
 Addended by: Elease Etienne A on: 01/06/2024 04:18 PM   Modules accepted: Orders

## 2024-01-07 ENCOUNTER — Encounter: Payer: Self-pay | Admitting: Neurology

## 2024-01-07 ENCOUNTER — Ambulatory Visit: Payer: HMO | Admitting: Neurology

## 2024-01-07 ENCOUNTER — Other Ambulatory Visit

## 2024-01-07 VITALS — BP 147/78 | HR 94 | Ht 61.5 in | Wt 149.0 lb

## 2024-01-07 DIAGNOSIS — G7001 Myasthenia gravis with (acute) exacerbation: Secondary | ICD-10-CM | POA: Diagnosis not present

## 2024-01-07 MED ORDER — MYCOPHENOLATE MOFETIL 500 MG PO TABS: 500.0000 mg | ORAL_TABLET | Freq: Two times a day (BID) | ORAL | 5 refills | Status: DC

## 2024-01-07 MED ORDER — PREDNISONE 10 MG PO TABS: 20.0000 mg | ORAL_TABLET | Freq: Every day | ORAL | 5 refills | Status: AC

## 2024-01-07 NOTE — Progress Notes (Signed)
 Follow-up Visit   Date: 01/07/2024    DINAH LUPA MRN: [id number] DOB: [birth year]    Heidi [name] is a 83 y.o. 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  The patient was accompanied to the clinic by self.     IMPRESSION/PLAN: Seronegative myasthenia gravis with exacerbation, diagnosed 07/2022. Symptoms initially presented with dysarthria, diplopia, ptosis, and dysphagia. AChR and MUSK antibody negative, thymoma negative.  She was hospitalized in February 2024 for MG exacerbation and received IVIG.  In May 2024, she was readmitted with DKA and MG exacerbation.    Today, she continues to have blurred vision, double vision, dysarthria, and generalized weakness.  Symptoms have been difficult to control even on maintenance dose of IVIG, so will need to increase prednisone.  Given the use of chronic corticosteroids, I discussed adding steroid sparing agent, which she is agreeable to.   Risks and benefits discussed.  Continue IVIG 1g/kg every 3 weeks Increase prednisone to 20mg  daily Start Cellcept 500mg  twice daily.   Check CBC and CMP today, then CBC and CMP every week for 4 weeks, then every month x 3 months.  Continue mestinon 30mg  twice daily at 10a and 4p (unable to titrate due to diarrhea)  Continue PT  Return to clinic in 3 months  --------------------------------------------- UPDATE 07/17/2022:  She was hospitalized in August for falls and hallucinations, found to be associated with her dementia.  She was discharged to rehab and now back at [facility].  She was falling frequently prior to her hospitalization, but reports no falls over the past 2 months.  Since being home, she has noticed difficulty with speaking and words being slurred, especially as the day progresses.  She continues to have intermittent  diplopia and wears prisms.  Eyelids still can be droopy, but slightly better than before.  Sometimes, her food gets stuck in her throat.  No arms or leg weakness.   She ha spells of visual distortion, described as if a jug-saw puzzle pieces are moving.  This occurs most days of the week.  She also reports having headaches at the left base of her head.   UPDATE 07/31/2022:  She started mestinon 60mg  1 tablet at 9am and 2pm.  She has noticed that about 4-5 hours after she takes the tablet, her speech starts to get slurred and double vision returns.  She has some difficulty swallowing her morning medications.  No problems with eating.  She also reports having spells of leg weakness and had to use a wheelchair at one point after playing bingo.   UPDATE 08/28/2022:    In October, she had EMG was consistent with myasthenia gravis and was started on prednisone 10mg  daily.  She noticed marked improvement in leg strength, swallowing, speech, and double vision.  She continues to have mild intermittent double vision and difficulty with getting words out.  No slurred speech.  She has been taking mestinon 60mg  three times daily, but reports having diarrhea with it.  It does help, because by around 5-6pm, she is feeling generalized weakness again.   UPDATE 10/30/2022:  She is here for follow-up visit.  She reports no significant change in her double vision or difficulty talking since her last visit, despite increasing prednisone to 15mg  daily.  She also feels that her legs are weak and has started PT for balance.    Due to GI upset,  she reduced mestinon to 30mg  at 9am, 60mg  at 2pm, and 30mg  at 5pm, which has helped diarrhea, but she continues to have cramps.  Overall, she is not feeling well.   UPDATE 12/30/2022:  She is here for follow-up visit.  She was hospitalized in February because of increased weakness and falls and received IVIG x 5 days.  She feels it may have stabilized her symptoms, but still feels weak and  takes her medications with applesauce.  She continues to have mild slurred speech.  She walks with a walker even for short distances.  She will be getting home PT/OT.    UPDATE 03/24/2023:  She is here for follow-up visit.  She was hospitalized in May due to DKA which daughter says was due to patient eating soft foods and drinking sweet due because of dysphagia.  While hospitalized she received a dose of IVIG because of generalized weakness.  Patient feels that IVIG helps, but by week 3 her symptoms start to get worse again.  She is due for infusion as her last one was in May when hospitalized. She has since moved to SNF at [facility] in [place].  Unfortunately, her SNF does not allow infusion at the facility. She remains on prednisone 20mg /d and mestinon 30mg  twice daily.  She still has weakness in the legs, dysphagia, and mild dysarthria.  She remains on mechanical soft diet and can use a walker with one-person assist, but does not do this very often.   UPDATE 05/13/2023:  She is here for follow-up visit. She has been getting IVIG every 3 weeks for he last two infusions and reports this has made a marked improvement because she is staying stronger for longer. Over the last 3-4 days, she has noticed a significant improvement in overall strength.  She is still using a wheelchair because she does not feel strong enough to use a walker.  She is able to transfer independently.  She is able to bathe herself, but needs assistance with getting in and out of the shower.  She has mild slurred speech when tired, double vision at end-gaze, and generalized fatigue.  She does feel that her last IVIG helped and she has felt the best in a few weeks.   She also complains of redness over the left upper arm which has been present for the past 5 days.  It does not itch or cause any pain.   UPDATE 07/22/2023:  She is here for follow-up visit. She continues to take prednisone 20mg  daily and mestinon 30mg  twice daily at 10a and  4pm.  She is also getting IVIG every 3 weeks and tolerating this well.  She has noticed that she has improved strength which lasts longer, typically she starts to get week about 3 days prior to the next infusion.  She still has some double vision, difficulty with speech/swallow, and leg weakness, but overall is feeling much better.  She is reliant on a wheelchair because her legs feel weak. PT was started last week.   UPDATE 10/22/2023:  She is here for follow-up visit.  She reports having improved overall strength with the last two infusions and was able to travel in and out of the car much better during the holidays.  She is able to stand and walk about 40 feet with a rollator.  She continues to have blurred vision, some double vision, and speech problems. She has seen her eye doctor and had recent change to prescription as well as prisms.  She has been wearing prisms for many years.  Overall, she is feeling much better.  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   UPDATE 01/07/2024:  She is here for follow-up visit.  She reports that since her last visit, her symptoms have been getting worse. She continues to have blurred vision, double vision, difficulty with speech, and generalized weakness.  She is no longer walking like she did before and is mostly wheelchair bound.     Medications:  Current Outpatient Medications on File Prior to Visit  Medication Sig Dispense Refill   acetaminophen (TYLENOL) 325 MG tablet Take 2 tablets (650 mg total) by mouth every 6 (six) hours as needed for mild pain (or Fever >/= 101). (Patient taking differently: Take 650 mg by mouth See admin instructions. 2 entries on MAR: 1) 650 mg three times daily (scheduled) 2) 650 mg every 6 hours as needed for pain)     alum & mag hydroxide-simeth (MAALOX PLUS) 400-400-40 MG/5ML suspension Take by mouth every 6 (six) hours as needed for  indigestion. 30 ML every 4 hours as needed     amLODipine (NORVASC) 2.5 MG tablet Take 2.5 mg by mouth daily.     ASPIRIN LOW DOSE 81 MG chewable tablet Chew 81 mg by mouth daily.     Cholecalciferol (VITAMIN D3) 125 MCG (5000 UT) CAPS Take 5,000 Units by mouth daily.     cholestyramine (QUESTRAN) 4 g packet Take 4 g by mouth See admin instructions. 2 entries on MAR: 1) 4g once daily. 2) 4g twice daily as needed for loose stool.     cloNIDine (CATAPRES) 0.1 MG tablet Take 0.1 mg by mouth 3 (three) times daily.     cyanocobalamin 1000 MCG tablet Take 1 tablet (1,000 mcg total) by mouth daily.     ezetimibe (ZETIA) 10 MG tablet Take 10 mg by mouth daily.     ferrous sulfate 325 (65 FE) MG tablet Take 325 mg by mouth daily.     fexofenadine (ALLEGRA) 180 MG tablet Take 180 mg by mouth daily.     fluticasone (FLONASE) 50 MCG/ACT nasal spray Place 1 spray into both nostrils 2 (two) times daily.     Fluticasone Propionate, Inhal, 50 MCG/ACT AEPB Inhale 1 spray into the lungs as needed.     furosemide (LASIX) 20 MG tablet TAKE 1 TABLET BY MOUTH 3 TIMES A WEEK. (Patient taking differently: Take 20 mg by mouth every Monday, Wednesday, and Friday.) 45 tablet 3   galantamine (RAZADYNE ER) 8 MG 24 hr capsule Take 4 mg by mouth daily.  2   hydrOXYzine (ATARAX/VISTARIL) 25 MG tablet Take 25 mg by mouth at bedtime.     insulin aspart protamine - aspart (NOVOLOG 70/30 FLEXPEN) (70-30) 100 UNIT/ML FlexPen Inject 100 Units into the skin 2 (two) times daily. Sliding Scale     LANTUS SOLOSTAR 100 UNIT/ML Solostar Pen Inject into the skin. 15 units at bedtime     levothyroxine (SYNTHROID) 88 MCG tablet Take 88 mcg by mouth daily before breakfast.     lidocaine (LIDOCAN) 5 % Place 1 patch onto the skin daily. Remove & Discard patch within 12 hours or as directed by MD     lidocaine (XYLOCAINE) 5 % ointment Apply 1 Application topically as needed. 35.44 g 0   losartan (COZAAR) 50 MG tablet Take 50 mg by mouth See  admin instructions.  Give 1.5 tablets orally one time a day for HTN     melatonin 3 MG TABS tablet Take 1 tablet (3 mg total) by mouth at bedtime.  0   Menthol, Topical Analgesic, (BIOFREEZE COOL THE PAIN) 4 % GEL Apply topically.     metFORMIN (GLUCOPHAGE) 500 MG tablet Take 500 mg by mouth 2 (two) times daily.     nortriptyline (PAMELOR) 50 MG capsule Take 2 capsules (100 mg total) by mouth at bedtime.     pantoprazole (PROTONIX) 40 MG tablet Take 40 mg by mouth 2 (two) times daily.     predniSONE (DELTASONE) 10 MG tablet Take 1.5 tablets (15 mg total) by mouth daily with breakfast. 45 tablet 5   pregabalin (LYRICA) 75 MG capsule Take 75 mg by mouth 3 (three) times daily.     pyridostigmine (MESTINON) 60 MG tablet Take half-tablet at 10a and 4pm. (Patient taking differently: Take 30 mg by mouth 2 (two) times daily at 10 am and 4 pm.) 90 tablet 5   spironolactone (ALDACTONE) 25 MG tablet Take 1 tablet (25 mg total) by mouth daily. 30 tablet 11   fenofibrate 160 MG tablet Take 160 mg by mouth daily. (Patient not taking: Reported on 10/22/2023)     gabapentin (NEURONTIN) 300 MG capsule Take 300 mg by mouth 3 (three) times daily. (Patient not taking: Reported on 10/22/2023)     insulin aspart (NOVOLOG FLEXPEN) 100 UNIT/ML FlexPen 0-9 Units, Subcutaneous, 3 times daily with meals CBG < 70: Implement Hypoglycemia measures CBG 70 - 120: 0 units CBG 121 - 150: 1 unit CBG 151 - 200: 2 units CBG 201 - 250: 3 units CBG 251 - 300: 5 units CBG 301 - 350: 7 units CBG 351 - 400: 9 units CBG > 400: call MD 15 mL 0   insulin glargine-yfgn (SEMGLEE) 100 UNIT/ML injection Inject 0.2 mLs (20 Units total) into the skin at bedtime. (Patient not taking: Reported on 07/22/2023) 10 mL 11   meloxicam (MOBIC) 7.5 MG tablet Take 1 tablet (7.5 mg total) by mouth daily. (Patient not taking: Reported on 07/22/2023) 15 tablet 0   methocarbamol (ROBAXIN) 500 MG tablet Take 1 tablet (500 mg total) by mouth 2 (two) times daily. (Patient  not taking: Reported on 10/22/2023) 20 tablet 0   niacin (VITAMIN B3) 500 MG tablet Take 1,000 mg by mouth at bedtime. (Patient not taking: Reported on 07/22/2023)     No current facility-administered medications on file prior to visit.    Allergies:  Allergies  Allergen Reactions   Crestor [Rosuvastatin Calcium] Other (See Comments)    Caused Myalgias   Effexor [Venlafaxine Hydrochloride] Other (See Comments) and Hypertension    Caused BP to go up   Aricept [Donepezil]     Other reaction(s): diarrhea   Cream Base Other (See Comments)    SOUR CREAM ONLY   Latex     Other reaction(s): rash   Nardil [Phenelzine]     Other reaction(s): HTN   Pork-Derived Products    Hibiclens [Chlorhexidine Gluconate] Hives   Lipitor [Atorvastatin Calcium] Rash   Lisinopril Rash    Other reaction(s): rash   Penicillins Swelling and Other (See Comments)    Tolerated Cephalosporin Date: 05/01/22     Plavix [Clopidogrel Bisulfate] Rash   Simvastatin Rash    Other reaction(s): rash   Sulfa Antibiotics Rash   Sulfonamide Derivatives Rash   Tape Rash    Other reaction(s): rash    Vital Signs:  BP Marland Kitchen)  147/78   Pulse 94   Ht 5' 1.5" (1.562 m)   Wt 149 lb (67.6 kg)   SpO2 100%   BMI 27.70 kg/m     Neurological Exam: MENTAL STATUS including orientation to time, place, person, recent and remote memory, attention span and concentration, language, and fund of knowledge is normal.  Speech is show mild dysarthria.  CRANIAL NERVES:   Pupils equal round and reactive to light.  Normal conjugate, extra-ocular eye movements in all directions of gaze. There is mild-moderate ptosis at rest and slightly worse with sustained upgaze. Face is symmetric.    MOTOR:  Motor strength is 5-/5 in all extremities.   No pronator drift.  Tone is normal.    COORDINATION/GAIT:  Gait not tested today, patient arrived in wheelchair.    Data: CT head 05/29/2022:  negative  Lab Results  Component Value Date   TSH  5.134 (H) 05/27/2022    Lab Results  Component Value Date   HGBA1C 7.4 (H) 11/15/2022   CT lumbar spine wo contrast 11/11/2022: 1. No acute osseous injury within the lumbar spine. 2. 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 3. 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  Total time spent reviewing records, interview, history/exam, documentation, and coordination of care on day of encounter:  30 min   Thank you for allowing me to participate in patient's care.  If I can answer any additional questions, I would be pleased to do so.    Sincerely,    Keylin Ferryman K. Allena Katz, DO

## 2024-01-07 NOTE — Patient Instructions (Addendum)
 Increase prednisone to 20mg  daily  Start Cellcept 500mg  twice daily.  As you start this medication, your labs will need to be checked as following:  CBC and CMP every week for 4 weeks, then every month x 3 months.   Check CBC and CMP today  Continue mestinon 30mg  twice daily  Continue IVIG every 3 weeks

## 2024-01-08 ENCOUNTER — Telehealth: Payer: Self-pay

## 2024-01-08 ENCOUNTER — Other Ambulatory Visit: Payer: Self-pay

## 2024-01-08 LAB — COMPREHENSIVE METABOLIC PANEL WITH GFR
AG Ratio: 1.4 (calc) (ref 1.0–2.5)
ALT: 58 U/L — ABNORMAL HIGH (ref 6–29)
AST: 50 U/L — ABNORMAL HIGH (ref 10–35)
Albumin: 4.2 g/dL (ref 3.6–5.1)
Alkaline phosphatase (APISO): 214 U/L — ABNORMAL HIGH (ref 37–153)
BUN: 14 mg/dL (ref 7–25)
CO2: 28 mmol/L (ref 20–32)
Calcium: 10.2 mg/dL (ref 8.6–10.4)
Chloride: 102 mmol/L (ref 98–110)
Creat: 0.66 mg/dL (ref 0.60–0.95)
Globulin: 3.1 g/dL (ref 1.9–3.7)
Glucose, Bld: 100 mg/dL — ABNORMAL HIGH (ref 65–99)
Potassium: 4 mmol/L (ref 3.5–5.3)
Sodium: 142 mmol/L (ref 135–146)
Total Bilirubin: 0.6 mg/dL (ref 0.2–1.2)
Total Protein: 7.3 g/dL (ref 6.1–8.1)
eGFR: 88 mL/min/{1.73_m2} (ref >=60)

## 2024-01-08 LAB — CBC
HCT: 42 % (ref 35.0–45.0)
Hemoglobin: 14.1 g/dL (ref 11.7–15.5)
MCH: 28.9 pg (ref 27.0–33.0)
MCHC: 33.6 g/dL (ref 32.0–36.0)
MCV: 86.1 fL (ref 80.0–100.0)
MPV: 12.1 fL (ref 7.5–12.5)
Platelets: 195 10*3/uL (ref 140–400)
RBC: 4.88 10*6/uL (ref 3.80–5.10)
RDW: 12.7 % (ref 11.0–15.0)
WBC: 12.3 10*3/uL — ABNORMAL HIGH (ref 3.8–10.8)

## 2024-01-08 MED ORDER — MYCOPHENOLATE MOFETIL 500 MG PO TABS: 500.0000 mg | ORAL_TABLET | Freq: Two times a day (BID) | ORAL | 5 refills | Status: AC

## 2024-01-08 NOTE — Telephone Encounter (Signed)
 Result Note: "Please let pt know that her liver enzymes are slightly elevated.  I recommend holding on starting Cellcept until we better understand what's causing this.  Please find out who her PCP is (Dr. Isaiah Serge is pulmolonogy, not sure why he's listed as PCP) so we can fax labs and request them to investigate this further. Please also call the facility to cancel Cellcept and lab orders.  Thanks"    [facility] [address] [place], [place] [address] [phone]  Called [facility] and was transferred to patients nurse Shelby Dubin and she stated that she will go ahead and D/C the order so patient does not get the 12:00 dose of Cellcept. Also, Tawanda informed me that patients PCP is Dr. Roseanne Reno from [facility]. Shelby Dubin advised for [place] to fax over d/c orders for Cellcept, and lab labs over to the facility and labs will be given to Dr. Roseanne Reno for investigation.

## 2024-01-08 NOTE — Progress Notes (Signed)
 Addendum  Due to baseline LFTs being mildly elevated (AST 50, ALT 58, alk phos 214), will hold on starting Cellcept.  Serial labs (CBC and CMP) will no longer need to be monitored.  Recommend patient follow-up with PCP to further investigate transaminitis.  If work-up reveals no primary liver issue, we can revisit starting Cellcept.   Gerasimos Plotts K. Allena Katz, DO

## 2024-01-14 ENCOUNTER — Ambulatory Visit (INDEPENDENT_AMBULATORY_CARE_PROVIDER_SITE_OTHER)

## 2024-01-14 VITALS — BP 135/71 | HR 98 | Temp 98.1°F | Resp 16 | Ht 61.25 in | Wt 148.0 lb

## 2024-01-14 DIAGNOSIS — G7001 Myasthenia gravis with (acute) exacerbation: Secondary | ICD-10-CM

## 2024-01-14 MED ORDER — DEXTROSE 5 % IV SOLN: INTRAVENOUS | Status: DC

## 2024-01-14 MED ORDER — IMMUNE GLOBULIN (HUMAN) 5 GM/50ML IV SOLN
65.0000 g | Freq: Once | INTRAVENOUS | Status: AC
  Administered 2024-01-14: 65 g via INTRAVENOUS

## 2024-01-14 MED ORDER — ACETAMINOPHEN 325 MG PO TABS
650.0000 mg | ORAL_TABLET | Freq: Once | ORAL | Status: AC
  Administered 2024-01-14: 650 mg via ORAL

## 2024-01-14 MED ORDER — DIPHENHYDRAMINE HCL 25 MG PO CAPS
25.0000 mg | ORAL_CAPSULE | Freq: Once | ORAL | Status: AC
  Administered 2024-01-14: 25 mg via ORAL

## 2024-01-14 NOTE — Progress Notes (Signed)
 Diagnosis: Myasthenia Gravis  Provider:  Chilton Greathouse MD  Procedure: IV Infusion  IV Type: Peripheral, IV Location: R Forearm  IVIG (Immune Globulin), Dose: 65 G  Infusion Start Time: 0943  Infusion Stop Time: 1449  Post Infusion IV Care: Peripheral IV Discontinued  Discharge: Condition: Good, Destination: Home . AVS Provided  Performed by:  Loney Hering, LPN

## 2024-02-04 ENCOUNTER — Ambulatory Visit

## 2024-02-04 VITALS — BP 136/76 | HR 94 | Temp 98.1°F | Resp 14 | Ht 61.0 in | Wt 147.6 lb

## 2024-02-04 DIAGNOSIS — G7001 Myasthenia gravis with (acute) exacerbation: Secondary | ICD-10-CM

## 2024-02-04 MED ORDER — IMMUNE GLOBULIN (HUMAN) 5 GM/50ML IV SOLN
65.0000 g | Freq: Once | INTRAVENOUS | Status: AC
  Administered 2024-02-04: 65 g via INTRAVENOUS

## 2024-02-04 MED ORDER — DEXTROSE 5 % IV SOLN: INTRAVENOUS | Status: DC

## 2024-02-04 MED ORDER — ACETAMINOPHEN 325 MG PO TABS
650.0000 mg | ORAL_TABLET | Freq: Once | ORAL | Status: AC
  Administered 2024-02-04: 650 mg via ORAL
  Filled 2024-02-04: qty 2

## 2024-02-04 MED ORDER — DIPHENHYDRAMINE HCL 25 MG PO CAPS
25.0000 mg | ORAL_CAPSULE | Freq: Once | ORAL | Status: AC
  Administered 2024-02-04: 25 mg via ORAL
  Filled 2024-02-04: qty 1

## 2024-02-04 NOTE — Progress Notes (Signed)
 Diagnosis: Myasthenia gravis with exacerbation   Provider:  Praveen Mannam MD  Procedure: IV Infusion  IV Type: Peripheral, IV Location: R Antecubital  IVIG (Immune Globulin ), Dose: 65 g  Infusion Start Time: 0957  Infusion Stop Time: 1515  Post Infusion IV Care: Peripheral IV Discontinued  Discharge: Condition: Good, Destination: Home . AVS Provided  Performed by:  Lauran Pollard, LPN

## 2024-02-25 ENCOUNTER — Ambulatory Visit (INDEPENDENT_AMBULATORY_CARE_PROVIDER_SITE_OTHER)

## 2024-02-25 VITALS — BP 148/75 | HR 89 | Temp 98.0°F | Resp 20 | Ht 61.25 in | Wt 145.0 lb

## 2024-02-25 DIAGNOSIS — G7001 Myasthenia gravis with (acute) exacerbation: Secondary | ICD-10-CM | POA: Diagnosis not present

## 2024-02-25 MED ORDER — DIPHENHYDRAMINE HCL 25 MG PO CAPS
25.0000 mg | ORAL_CAPSULE | Freq: Once | ORAL | Status: AC
  Administered 2024-02-25: 25 mg via ORAL
  Filled 2024-02-25: qty 1

## 2024-02-25 MED ORDER — DEXTROSE 5 % IV SOLN: INTRAVENOUS | Status: DC

## 2024-02-25 MED ORDER — IMMUNE GLOBULIN (HUMAN) 5 GM/50ML IV SOLN
65.0000 g | Freq: Once | INTRAVENOUS | Status: AC
  Administered 2024-02-25: 65 g via INTRAVENOUS

## 2024-02-25 MED ORDER — ACETAMINOPHEN 325 MG PO TABS
650.0000 mg | ORAL_TABLET | Freq: Once | ORAL | Status: AC
  Administered 2024-02-25: 650 mg via ORAL
  Filled 2024-02-25: qty 2

## 2024-02-25 NOTE — Progress Notes (Signed)
 Diagnosis: Myasthenia gravis with exacerbation   Provider:  Praveen Mannam MD  Procedure: IV Infusion  IV Type: Peripheral, IV Location: L Antecubital  IVIG (Immune Globulin ), Dose: 65 g  Infusion Start Time: 1014  Infusion Stop Time: 1525  Post Infusion IV Care: Peripheral IV Discontinued  Discharge: Condition: Good, Destination: Home . AVS Provided  Performed by:  Lauran Pollard, LPN

## 2024-03-03 ENCOUNTER — Ambulatory Visit (INDEPENDENT_AMBULATORY_CARE_PROVIDER_SITE_OTHER): Payer: Medicare Other

## 2024-03-03 DIAGNOSIS — I495 Sick sinus syndrome: Secondary | ICD-10-CM | POA: Diagnosis not present

## 2024-03-04 LAB — CUP PACEART REMOTE DEVICE CHECK
Battery Impedance: 2494 Ohm
Battery Remaining Longevity: 33 mo
Battery Voltage: 2.74 V
Brady Statistic AP VP Percent: 0 %
Brady Statistic AP VS Percent: 1 %
Brady Statistic AS VP Percent: 0 %
Brady Statistic AS VS Percent: 99 %
Date Time Interrogation Session: 20250528075717
Implantable Lead Connection Status: 753985
Implantable Lead Connection Status: 753985
Implantable Lead Implant Date: 20070710
Implantable Lead Implant Date: 20070710
Implantable Lead Location: 753859
Implantable Lead Location: 753860
Implantable Lead Model: 4469
Implantable Lead Model: 4470
Implantable Lead Serial Number: 483166
Implantable Lead Serial Number: 541525
Implantable Pulse Generator Implant Date: 20130410
Lead Channel Impedance Value: 662 Ohm
Lead Channel Impedance Value: 816 Ohm
Lead Channel Pacing Threshold Amplitude: 0.625 V
Lead Channel Pacing Threshold Amplitude: 0.875 V
Lead Channel Pacing Threshold Pulse Width: 0.4 ms
Lead Channel Pacing Threshold Pulse Width: 0.4 ms
Lead Channel Setting Pacing Amplitude: 2 V
Lead Channel Setting Pacing Amplitude: 2.5 V
Lead Channel Setting Pacing Pulse Width: 0.4 ms
Lead Channel Setting Sensing Sensitivity: 5.6 mV
Zone Setting Status: 755011
Zone Setting Status: 755011

## 2024-03-07 ENCOUNTER — Ambulatory Visit: Payer: Self-pay | Admitting: Cardiology

## 2024-03-09 ENCOUNTER — Encounter (HOSPITAL_COMMUNITY): Payer: Self-pay

## 2024-03-09 ENCOUNTER — Emergency Department (HOSPITAL_COMMUNITY)
Admission: EM | Admit: 2024-03-09 | Discharge: 2024-03-10 | Disposition: A | Discharge status: Home / Self Care | Attending: Emergency Medicine | Admitting: Emergency Medicine

## 2024-03-09 ENCOUNTER — Other Ambulatory Visit: Payer: Self-pay

## 2024-03-09 ENCOUNTER — Emergency Department (HOSPITAL_COMMUNITY)

## 2024-03-09 DIAGNOSIS — Z91040 Latex allergy status: Secondary | ICD-10-CM | POA: Insufficient documentation

## 2024-03-09 DIAGNOSIS — G8929 Other chronic pain: Secondary | ICD-10-CM | POA: Insufficient documentation

## 2024-03-09 DIAGNOSIS — Z7982 Long term (current) use of aspirin: Secondary | ICD-10-CM | POA: Diagnosis not present

## 2024-03-09 DIAGNOSIS — I700 Atherosclerosis of aorta: Secondary | ICD-10-CM | POA: Insufficient documentation

## 2024-03-09 DIAGNOSIS — K5730 Diverticulosis of large intestine without perforation or abscess without bleeding: Secondary | ICD-10-CM | POA: Insufficient documentation

## 2024-03-09 DIAGNOSIS — Z794 Long term (current) use of insulin: Secondary | ICD-10-CM | POA: Diagnosis not present

## 2024-03-09 DIAGNOSIS — M545 Low back pain, unspecified: Secondary | ICD-10-CM | POA: Insufficient documentation

## 2024-03-09 LAB — BASIC METABOLIC PANEL WITH GFR
Anion gap: 10 (ref 5–15)
BUN: 17 mg/dL (ref 8–23)
CO2: 24 mmol/L (ref 22–32)
Calcium: 9.9 mg/dL (ref 8.9–10.3)
Chloride: 104 mmol/L (ref 98–111)
Creatinine, Ser: 0.78 mg/dL (ref 0.44–1.00)
GFR, Estimated: >60 mL/min (ref >60)
Glucose, Bld: 99 mg/dL (ref 70–99)
Potassium: 4.2 mmol/L (ref 3.5–5.1)
Sodium: 138 mmol/L (ref 135–145)

## 2024-03-09 LAB — URINALYSIS, ROUTINE W REFLEX MICROSCOPIC
Bilirubin Urine: NEGATIVE
Glucose, UA: NEGATIVE mg/dL
Hgb urine dipstick: NEGATIVE
Ketones, ur: NEGATIVE mg/dL
Leukocytes,Ua: NEGATIVE
Nitrite: NEGATIVE
Protein, ur: NEGATIVE mg/dL
Specific Gravity, Urine: 1.012 (ref 1.005–1.030)
pH: 6 (ref 5.0–8.0)

## 2024-03-09 LAB — CBC
HCT: 44.4 % (ref 36.0–46.0)
Hemoglobin: 14.4 g/dL (ref 12.0–15.0)
MCH: 28.3 pg (ref 26.0–34.0)
MCHC: 32.4 g/dL (ref 30.0–36.0)
MCV: 87.2 fL (ref 80.0–100.0)
Platelets: 220 10*3/uL (ref 150–400)
RBC: 5.09 MIL/uL (ref 3.87–5.11)
RDW: 13.9 % (ref 11.5–15.5)
WBC: 10.3 10*3/uL (ref 4.0–10.5)
nRBC: 0 % (ref 0.0–0.2)

## 2024-03-09 MED ORDER — LIDOCAINE 5 % EX PTCH: 1.0000 | MEDICATED_PATCH | CUTANEOUS | 0 refills | Status: AC

## 2024-03-09 MED ORDER — MORPHINE SULFATE (PF) 4 MG/ML IV SOLN
4.0000 mg | Freq: Once | INTRAVENOUS | Status: AC
  Administered 2024-03-09: 4 mg via INTRAVENOUS
  Filled 2024-03-09: qty 1

## 2024-03-09 MED ORDER — LIDOCAINE 5 % EX PTCH
1.0000 | MEDICATED_PATCH | CUTANEOUS | Status: DC
  Administered 2024-03-09: 1 via TRANSDERMAL
  Filled 2024-03-09: qty 1

## 2024-03-09 MED ORDER — OXYCODONE HCL 5 MG PO TABS: 5.0000 mg | ORAL_TABLET | Freq: Four times a day (QID) | ORAL | 0 refills | Status: AC | PRN

## 2024-03-09 NOTE — ED Provider Notes (Signed)
 [facility] Provider Note   CSN: [id number] Arrival date & time: 03/09/24  1837     History {Add pertinent medical, surgical, social history, OB history to HPI:1} Chief Complaint  Patient presents with   Back Pain    Heidi [name] is a 83 y.o. female.   Back Pain      Home Medications Prior to Admission medications   Medication Sig Start Date End Date Taking? Authorizing Provider  acetaminophen  (TYLENOL ) 325 MG tablet Take 2 tablets (650 mg total) by mouth every 6 (six) hours as needed for mild pain (or Fever >/= 101). Patient taking differently: Take 650 mg by mouth See admin instructions. 2 entries on MAR: 1) 650 mg three times daily (scheduled) 2) 650 mg every 6 hours as needed for pain 05/30/22   Elgergawy, Dawood S, MD  alum & mag hydroxide-simeth (MAALOX PLUS) 400-400-40 MG/5ML suspension Take by mouth every 6 (six) hours as needed for indigestion. 30 ML every 4 hours as needed    [provider]  amLODipine (NORVASC) 2.5 MG tablet Take 2.5 mg by mouth daily.    [provider]  ASPIRIN  LOW DOSE 81 MG chewable tablet Chew 81 mg by mouth daily. 11/07/22   [provider]  Cholecalciferol  (VITAMIN D3) 125 MCG (5000 UT) CAPS Take 5,000 Units by mouth daily.    [provider]  cholestyramine  (QUESTRAN ) 4 g packet Take 4 g by mouth See admin instructions. 2 entries on MAR: 1) 4g once daily. 2) 4g twice daily as needed for loose stool.    [provider]  cloNIDine (CATAPRES) 0.1 MG tablet Take 0.1 mg by mouth 3 (three) times daily. 09/16/23   [provider]  cyanocobalamin  1000 MCG tablet Take 1 tablet (1,000 mcg total) by mouth daily. 05/31/22   Elgergawy, Ardia Kraft, MD  ezetimibe  (ZETIA ) 10 MG tablet Take 10 mg by mouth daily.    [provider]  fenofibrate  160 MG tablet Take 160 mg by mouth daily. Patient not taking: Reported on 10/22/2023    [provider]   ferrous sulfate  325 (65 FE) MG tablet Take 325 mg by mouth daily.    [provider]  fexofenadine (ALLEGRA) 180 MG tablet Take 180 mg by mouth daily.    [provider]  fluticasone  (FLONASE ) 50 MCG/ACT nasal spray Place 1 spray into both nostrils 2 (two) times daily.    [provider]  Fluticasone  Propionate, Inhal, 50 MCG/ACT AEPB Inhale 1 spray into the lungs as needed.    [provider]  furosemide  (LASIX ) 20 MG tablet TAKE 1 TABLET BY MOUTH 3 TIMES A WEEK. Patient taking differently: Take 20 mg by mouth every Monday, Wednesday, and Friday. 05/07/21   Hugh Madura, MD  gabapentin  (NEURONTIN ) 300 MG capsule Take 300 mg by mouth 3 (three) times daily. Patient not taking: Reported on 10/22/2023    [provider]  galantamine  (RAZADYNE  ER) 8 MG 24 hr capsule Take 4 mg by mouth daily. 08/13/18   [provider]  hydrOXYzine  (ATARAX /VISTARIL ) 25 MG tablet Take 25 mg by mouth at bedtime.    [provider]  insulin  aspart (NOVOLOG  FLEXPEN) 100 UNIT/ML FlexPen 0-9 Units, Subcutaneous, 3 times daily with meals CBG < 70: Implement Hypoglycemia measures CBG 70 - 120: 0 units CBG 121 - 150: 1 unit CBG 151 - 200: 2 units CBG 201 - 250: 3 units CBG 251 - 300: 5 units CBG  301 - 350: 7 units CBG 351 - 400: 9 units CBG > 400: call MD 02/27/23   Burton Casey, MD  insulin  aspart protamine - aspart (NOVOLOG  70/30 FLEXPEN) (70-30) 100 UNIT/ML FlexPen Inject 100 Units into the skin 2 (two) times daily. Sliding Scale    [provider]  insulin  glargine-yfgn (SEMGLEE ) 100 UNIT/ML injection Inject 0.2 mLs (20 Units total) into the skin at bedtime. Patient not taking: Reported on 07/22/2023 02/27/23   Burton Casey, MD  LANTUS  SOLOSTAR 100 UNIT/ML Solostar Pen Inject into the skin. 15 units at bedtime 09/15/23   [provider]  levothyroxine  (SYNTHROID ) 88 MCG tablet Take 88 mcg by mouth daily before breakfast.    [provider]  lidocaine  (LIDOCAN) 5 % Place 1 patch onto the skin daily. Remove & Discard patch within 12 hours or as directed by MD    [provider]  lidocaine  (XYLOCAINE ) 5 % ointment Apply 1 Application topically as needed. 11/28/23   Long, Joshua G, MD  losartan (COZAAR) 50 MG tablet Take 50 mg by mouth See admin instructions. Give 1.5 tablets orally one time a day for HTN    [provider]  melatonin 3 MG TABS tablet Take 1 tablet (3 mg total) by mouth at bedtime. 05/30/22   Elgergawy, Ardia Kraft, MD  meloxicam  (MOBIC ) 7.5 MG tablet Take 1 tablet (7.5 mg total) by mouth daily. Patient not taking: Reported on 07/22/2023 11/17/22   Ballard Bongo, MD  Menthol , Topical Analgesic, (BIOFREEZE COOL THE PAIN) 4 % GEL Apply topically.    [provider]  metFORMIN  (GLUCOPHAGE ) 500 MG tablet Take 500 mg by mouth 2 (two) times daily.    [provider]  methocarbamol  (ROBAXIN ) 500 MG tablet Take 1 tablet (500 mg total) by mouth 2 (two) times daily. Patient not taking: Reported on 10/22/2023 11/17/22   Ballard Bongo, MD  mycophenolate  (CELLCEPT ) 500 MG tablet Take 1 tablet (500 mg total) by mouth 2 (two) times daily. 01/08/24   Patel, Donika K, DO  niacin  (VITAMIN B3) 500 MG tablet Take 1,000 mg by mouth at bedtime. Patient not taking: Reported on 07/22/2023    [provider]  nortriptyline  (PAMELOR ) 50 MG capsule Take 2 capsules (100 mg total) by mouth at bedtime. 05/30/22   Elgergawy, Ardia Kraft, MD  pantoprazole  (PROTONIX ) 40 MG tablet Take 40 mg by mouth 2 (two) times daily. 06/01/18   [provider]  predniSONE  (DELTASONE ) 10 MG tablet Take 2 tablets (20 mg total) by mouth daily with breakfast. 01/07/24   Patel, Donika K, DO  pregabalin (LYRICA) 75 MG capsule Take 75 mg by mouth 3 (three) times daily. 10/17/23   [provider]  pyridostigmine  (MESTINON ) 60 MG tablet Take half-tablet at 10a and 4pm. Patient taking differently:  Take 30 mg by mouth 2 (two) times daily at 10 am and 4 pm. 10/30/22   Patel, Donika K, DO  spironolactone  (ALDACTONE ) 25 MG tablet Take 1 tablet (25 mg total) by mouth daily. 10/03/17   Verona Goodwill, MD      Allergies    Crestor [rosuvastatin calcium], Effexor [venlafaxine hydrochloride], Aricept  [donepezil ], Cream base, Latex, Nardil [phenelzine], Pork-derived products, Hibiclens  [chlorhexidine  gluconate], Lipitor [atorvastatin calcium], Lisinopril, Penicillins, Plavix [clopidogrel bisulfate], Simvastatin, Sulfa antibiotics, Sulfonamide derivatives, and Tape    Review of Systems   Review of Systems  Musculoskeletal:  Positive for back pain.    Physical Exam Updated Vital Signs BP 131/77 (BP Location:  Right Arm)   Pulse (!) 107   Temp 98.2 F (36.8 C)   Resp 16   SpO2 98%  Physical Exam  ED Results / Procedures / Treatments   Labs (all labs ordered are listed, but only abnormal results are displayed) Labs Reviewed  CBC  BASIC METABOLIC PANEL WITH GFR  URINALYSIS, ROUTINE W REFLEX MICROSCOPIC    EKG None  Radiology No results found.  Procedures Procedures  {Document cardiac monitor, telemetry assessment procedure when appropriate:1}  Medications Ordered in ED Medications  morphine  (PF) 4 MG/ML injection 4 mg (has no administration in time range)    ED Course/ Medical Decision Making/ A&P   {   Click here for ABCD2, HEART and other calculatorsREFRESH Note before signing :1}                              Medical Decision Making Risk Prescription drug management.   ***  {Document critical care time when appropriate:1} {Document review of labs and clinical decision tools ie heart score, Chads2Vasc2 etc:1}  {Document your independent review of radiology images, and any outside records:1} {Document your discussion with family members, caretakers, and with consultants:1} {Document social determinants of health affecting pt's care:1} {Document your decision  making why or why not admission, treatments were needed:1} Final Clinical Impression(s) / ED Diagnoses Final diagnoses:  None    Rx / DC Orders ED Discharge Orders     None

## 2024-03-09 NOTE — ED Notes (Signed)
[facility] called for transport  to address on file.

## 2024-03-09 NOTE — ED Triage Notes (Signed)
 Pt c.o right sided mid to lower back pain x3 weeks, no falls or trauma. Pt has a fall in feb and injured her back but this is not associated with this pain. No urinary s/s. Pt given 100mcg fentanyl  given PTA

## 2024-03-09 NOTE — ED Provider Triage Note (Signed)
 Emergency Medicine Provider Triage Evaluation Note  Heidi [name] , a 83 y.o. female  was evaluated in triage.  Pt complains of right flank pain.  Symptoms have been ongoing x 3 weeks, patient lives in an assisted living facility and was evaluated by a provider there last night, she was told that they were concerned she may have a kidney stone.  She denies dysuria/hematuria/change in bowel/bladder habits/fevers.  Patient was given pain medication en route with EMS. Review of Systems  Positive: As above Negative: As above  Physical Exam  BP 131/77 (BP Location: Right Arm)   Pulse (!) 107   Temp 98.2 F (36.8 C)   Resp 16   SpO2 98%  Gen:   Awake, no distress   Resp:  Normal effort  MSK:   Moves extremities without difficulty  Other:  Right CVA tenderness.  No bony tenderness to palpation of spinous processes.  Medical Decision Making  Medically screening exam initiated at 7:26 PM.  Appropriate orders placed.  Mai Schwalbe was informed that the remainder of the evaluation will be completed by another provider, this initial triage assessment does not replace that evaluation, and the importance of remaining in the ED until their evaluation is complete.     Kendrick Pax, [place] 03/09/24 1931

## 2024-03-09 NOTE — Discharge Instructions (Signed)
 Return for any problem.  ?

## 2024-03-10 NOTE — ED Notes (Signed)
 Report given to Heidi [name]. Pt in NAD at time of discharge

## 2024-03-17 ENCOUNTER — Ambulatory Visit

## 2024-03-17 VITALS — BP 145/82 | HR 90 | Temp 98.2°F | Resp 20 | Ht 61.25 in | Wt 147.0 lb

## 2024-03-17 DIAGNOSIS — G7001 Myasthenia gravis with (acute) exacerbation: Secondary | ICD-10-CM

## 2024-03-17 MED ORDER — DEXTROSE 5 % IV SOLN: INTRAVENOUS | Status: DC

## 2024-03-17 MED ORDER — ACETAMINOPHEN 325 MG PO TABS
650.0000 mg | ORAL_TABLET | Freq: Once | ORAL | Status: AC
  Administered 2024-03-17: 650 mg via ORAL
  Filled 2024-03-17: qty 2

## 2024-03-17 MED ORDER — IMMUNE GLOBULIN (HUMAN) 5 GM/50ML IV SOLN
65.0000 g | Freq: Once | INTRAVENOUS | Status: AC
  Administered 2024-03-17: 65 g via INTRAVENOUS

## 2024-03-17 MED ORDER — DIPHENHYDRAMINE HCL 25 MG PO CAPS
25.0000 mg | ORAL_CAPSULE | Freq: Once | ORAL | Status: AC
  Administered 2024-03-17: 25 mg via ORAL
  Filled 2024-03-17: qty 1

## 2024-03-17 NOTE — Progress Notes (Signed)
 Diagnosis: Myasthenia Gravis  Provider:  Praveen Mannam MD  Procedure: IV Infusion  IV Type: Peripheral, IV Location: L Forearm  IVIG (Immune Globulin ), Dose: 65G  Infusion Start Time: 1005  Infusion Stop Time: 1459  Post Infusion IV Care: Peripheral IV Discontinued  Discharge: Condition: Good, Destination: Home . AVS Provided  Performed by:  Star East, LPN

## 2024-03-19 ENCOUNTER — Telehealth: Payer: Self-pay | Admitting: Neurology

## 2024-03-19 NOTE — Telephone Encounter (Signed)
 [facility] called to res[facility] pt appt. She has an appt with Dr.Patel 7/2 but she also has an infusion the same morning. Dr. Lydia Sams is booking out until oct. She wants to know if that is okay to schedule that far out or does Lydia Sams want to see her sooner than that  Call rose back [phone] ext [phone]

## 2024-03-22 NOTE — Telephone Encounter (Signed)
 OK to add on 7/30 (or other Wednesday) as work-in follow-up.

## 2024-03-24 ENCOUNTER — Ambulatory Visit: Admitting: Neurology

## 2024-03-24 VITALS — BP 137/72 | HR 91 | Ht 61.25 in

## 2024-03-24 DIAGNOSIS — G7001 Myasthenia gravis with (acute) exacerbation: Secondary | ICD-10-CM

## 2024-03-24 NOTE — Progress Notes (Signed)
 Follow-up Visit   Date: 03/24/2024    PAIZLEY RAMELLA MRN: [id number] DOB: 04-20-1941    Heidi [name] is a 83 y.o. 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  The patient was accompanied to the clinic by self.     IMPRESSION/PLAN: Seronegative myasthenia gravis with exacerbation, diagnosed 07/2022.  Symptoms presented with dysarthria, diplopia, ptosis, and dysphagia. AChR and MUSK antibody negative, thymoma negative.  She was hospitalized in February 2024 for MG exacerbation and received IVIG.  In May 2024, she was readmitted with DKA and MG exacerbation.   Despite being on IVIG and prednisone , she has always been symptomatic.  At her last visit, we discussed adding steroid-sparing medication, however, after baseline LFTs were slightly elevated, I decided to hold on this.   She continues to have some double vision and dysarthria.  No focal weakness.    Continue IVIG 1g/kg every 3 weeks Continue prednisone  20mg  daily Continue mestinon  30mg  twice daily at 10a and 4pm  Return to clinic in 4 months  --------------------------------------------- UPDATE 07/17/2022:  She was hospitalized in August for falls and hallucinations, found to be associated with her dementia.  She was discharged to rehab and now back at [facility].  She was falling frequently prior to her hospitalization, but reports no falls over the past 2 months.  Since being home, she has noticed difficulty with speaking and words being slurred, especially as the day progresses.  She continues to have intermittent diplopia and wears prisms.  Eyelids still can be droopy, but slightly better than before.  Sometimes, her food gets stuck in her throat.  No arms or leg weakness.   She ha spells of visual distortion, described as if a jug-saw puzzle pieces are moving.   This occurs most days of the week.  She also reports having headaches at the left base of her head.   UPDATE 07/31/2022:  She started mestinon  60mg  1 tablet at 9am and 2pm.  She has noticed that about 4-5 hours after she takes the tablet, her speech starts to get slurred and double vision returns.  She has some difficulty swallowing her morning medications.  No problems with eating.  She also reports having spells of leg weakness and had to use a wheelchair at one point after playing bingo.   UPDATE 08/28/2022:    In October, she had EMG was consistent with myasthenia gravis and was started on prednisone  10mg  daily.  She noticed marked improvement in leg strength, swallowing, speech, and double vision.  She continues to have mild intermittent double vision and difficulty with getting words out.  No slurred speech.  She has been taking mestinon  60mg  three times daily, but reports having diarrhea with it.  It does help, because by around 5-6pm, she is feeling generalized weakness again.   UPDATE 10/30/2022:  She is here for follow-up visit.  She reports no significant change in her double vision or difficulty talking since her last visit, despite increasing prednisone  to 15mg  daily.  She also feels that her legs are weak and has started PT for balance.    Due to GI upset, she reduced mestinon  to 30mg  at 9am, 60mg  at 2pm, and 30mg  at 5pm, which has helped diarrhea, but she continues to have cramps.  Overall, she is not feeling well.   UPDATE 12/30/2022:  She is here for follow-up visit.  She  was hospitalized in February because of increased weakness and falls and received IVIG x 5 days.  She feels it may have stabilized her symptoms, but still feels weak and takes her medications with applesauce.  She continues to have mild slurred speech.  She walks with a walker even for short distances.  She will be getting home PT/OT.    UPDATE 03/24/2023:  She is here for follow-up visit.  She was hospitalized in May due to  DKA which daughter says was due to patient eating soft foods and drinking sweet due because of dysphagia.  While hospitalized she received a dose of IVIG because of generalized weakness.  Patient feels that IVIG helps, but by week 3 her symptoms start to get worse again.  She is due for infusion as her last one was in May when hospitalized. She has since moved to SNF at [facility] in [place].  Unfortunately, her SNF does not allow infusion at the facility. She remains on prednisone  20mg /d and mestinon  30mg  twice daily.  She still has weakness in the legs, dysphagia, and mild dysarthria.  She remains on mechanical soft diet and can use a walker with one-person assist, but does not do this very often.   UPDATE 05/13/2023:  She is here for follow-up visit. She has been getting IVIG every 3 weeks for he last two infusions and reports this has made a marked improvement because she is staying stronger for longer. Over the last 3-4 days, she has noticed a significant improvement in overall strength.  She is still using a wheelchair because she does not feel strong enough to use a walker.  She is able to transfer independently.  She is able to bathe herself, but needs assistance with getting in and out of the shower.  She has mild slurred speech when tired, double vision at end-gaze, and generalized fatigue.  She does feel that her last IVIG helped and she has felt the best in a few weeks.   She also complains of redness over the left upper arm which has been present for the past 5 days.  It does not itch or cause any pain.   UPDATE 07/22/2023:  She is here for follow-up visit. She continues to take prednisone  20mg  daily and mestinon  30mg  twice daily at 10a and 4pm.  She is also getting IVIG every 3 weeks and tolerating this well.  She has noticed that she has improved strength which lasts longer, typically she starts to get week about 3 days prior to the next infusion.  She still has some double vision, difficulty  with speech/swallow, and leg weakness, but overall is feeling much better.  She is reliant on a wheelchair because her legs feel weak. PT was started last week.   UPDATE 10/22/2023:  She is here for follow-up visit.  She reports having improved overall strength with the last two infusions and was able to travel in and out of the car much better during the holidays.  She is able to stand and walk about 40 feet with a rollator.  She continues to have blurred vision, some double vision, and speech problems. She has seen her eye doctor and had recent change to prescription as well as prisms.  She has been wearing prisms for many years.  Overall, she is feeling much better.  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   UPDATE  01/07/2024:  She is here for follow-up visit.  She reports that since her last visit, her symptoms have been getting worse. She continues to have blurred vision, double vision, difficulty with speech, and generalized weakness.  She is no longer walking like she did before and is mostly wheelchair bound.    UPDATE 03/24/2024:  She reports having some benefit with her last IVIG and increase in prednisone  such that her energy was better. She continues to have double vision and difficulty with speech.  No problems with swallow.  She continues to have generalized weakness, no worse than before.  She gets assistance with all ADLs, except for feeding.  No new complaints.    Medications:  Current Outpatient Medications on File Prior to Visit  Medication Sig Dispense Refill   acetaminophen  (TYLENOL ) 325 MG tablet Take 2 tablets (650 mg total) by mouth every 6 (six) hours as needed for mild pain (or Fever >/= 101).     alum & mag hydroxide-simeth (MAALOX PLUS) 400-400-40 MG/5ML suspension Take by mouth every 6 (six) hours as needed for indigestion. 30 ML every 4 hours as needed     amLODipine (NORVASC) 2.5 MG  tablet Take 2.5 mg by mouth daily.     ASPIRIN  LOW DOSE 81 MG chewable tablet Chew 81 mg by mouth daily.     cholestyramine  (QUESTRAN ) 4 g packet Take 4 g by mouth See admin instructions. 2 entries on MAR: 1) 4g once daily. 2) 4g twice daily as needed for loose stool.     cloNIDine (CATAPRES) 0.1 MG tablet Take 0.1 mg by mouth 3 (three) times daily.     ezetimibe  (ZETIA ) 10 MG tablet Take 10 mg by mouth daily.     ferrous sulfate  325 (65 FE) MG tablet Take 325 mg by mouth daily.     fexofenadine (ALLEGRA) 180 MG tablet Take 180 mg by mouth daily.     fluticasone  (FLONASE ) 50 MCG/ACT nasal spray Place 1 spray into both nostrils 2 (two) times daily.     Fluticasone  Propionate, Inhal, 50 MCG/ACT AEPB Inhale 1 spray into the lungs as needed.     furosemide  (LASIX ) 20 MG tablet TAKE 1 TABLET BY MOUTH 3 TIMES A WEEK. 45 tablet 3   gabapentin  (NEURONTIN ) 300 MG capsule Take 300 mg by mouth 3 (three) times daily. (Patient taking differently: Take 100 mg by mouth 3 (three) times daily.  Three tablets 3 times a day)     galantamine  (RAZADYNE  ER) 8 MG 24 hr capsule Take 4 mg by mouth daily.  2   hydrOXYzine  (ATARAX /VISTARIL ) 25 MG tablet Take 25 mg by mouth at bedtime.     insulin  aspart (NOVOLOG  FLEXPEN) 100 UNIT/ML FlexPen 0-9 Units, Subcutaneous, 3 times daily with meals CBG < 70: Implement Hypoglycemia measures CBG 70 - 120: 0 units CBG 121 - 150: 1 unit CBG 151 - 200: 2 units CBG 201 - 250: 3 units CBG 251 - 300: 5 units CBG 301 - 350: 7 units CBG 351 - 400: 9 units CBG > 400: call MD 15 mL 0   insulin  aspart protamine - aspart (NOVOLOG  70/30 FLEXPEN) (70-30) 100 UNIT/ML FlexPen Inject 100 Units into the skin 2 (two) times daily. Sliding Scale     insulin  glargine-yfgn (SEMGLEE ) 100 UNIT/ML injection Inject 0.2 mLs (20 Units total) into the skin at bedtime. 10 mL 11   LANTUS  SOLOSTAR 100 UNIT/ML Solostar Pen Inject into the skin. 15 units at bedtime     levothyroxine  (SYNTHROID )  88 MCG tablet Take 88  mcg by mouth daily before breakfast.     lidocaine  (LIDOCAN) 5 % Place 1 patch onto the skin daily. Remove & Discard patch within 12 hours or as directed by MD     lidocaine  (LIDODERM ) 5 % Place 1 patch onto the skin daily. Remove & Discard patch within 12 hours or as directed by MD 30 patch 0   lidocaine  (XYLOCAINE ) 5 % ointment Apply 1 Application topically as needed. 35.44 g 0   losartan (COZAAR) 50 MG tablet Take 50 mg by mouth See admin instructions. Give 1.5 tablets orally one time a day for HTN     melatonin 3 MG TABS tablet Take 1 tablet (3 mg total) by mouth at bedtime.  0   Menthol , Topical Analgesic, (BIOFREEZE COOL THE PAIN) 4 % GEL Apply topically.     metFORMIN  (GLUCOPHAGE ) 500 MG tablet Take 500 mg by mouth 2 (two) times daily.     methocarbamol  (ROBAXIN ) 500 MG tablet Take 1 tablet (500 mg total) by mouth 2 (two) times daily. (Patient taking differently: Take 750 mg by mouth every 8 (eight) hours. One tablet every 8 hours for muscle spasms.) 20 tablet 0   nortriptyline  (PAMELOR ) 50 MG capsule Take 2 capsules (100 mg total) by mouth at bedtime.     oxyCODONE  (ROXICODONE ) 5 MG immediate release tablet Take 1 tablet (5 mg total) by mouth every 6 (six) hours as needed for severe pain (pain score 7-10). 15 tablet 0   pantoprazole  (PROTONIX ) 40 MG tablet Take 40 mg by mouth 2 (two) times daily.     phenylephrine -shark liver oil-mineral oil-petrolatum (PREPARATION H) 0.25-14-74.9 % rectal ointment Place 1 Application rectally 2 (two) times daily as needed for hemorrhoids.     predniSONE  (DELTASONE ) 10 MG tablet Take 2 tablets (20 mg total) by mouth daily with breakfast. 60 tablet 5   pyridostigmine  (MESTINON ) 60 MG tablet Take half-tablet at 10a and 4pm. 90 tablet 5   sodium chloride  (OCEAN) 0.65 % SOLN nasal spray Place 1 spray into both nostrils as needed for congestion.     spironolactone  (ALDACTONE ) 25 MG tablet Take 1 tablet (25 mg total) by mouth daily. 30 tablet 11   Cholecalciferol   (VITAMIN D3) 125 MCG (5000 UT) CAPS Take 5,000 Units by mouth daily. (Patient not taking: Reported on 03/24/2024)     cyanocobalamin  1000 MCG tablet Take 1 tablet (1,000 mcg total) by mouth daily.     fenofibrate  160 MG tablet Take 160 mg by mouth daily. (Patient not taking: Reported on 03/24/2024)     meloxicam  (MOBIC ) 7.5 MG tablet Take 1 tablet (7.5 mg total) by mouth daily. (Patient not taking: Reported on 03/24/2024) 15 tablet 0   mycophenolate  (CELLCEPT ) 500 MG tablet Take 1 tablet (500 mg total) by mouth 2 (two) times daily. (Patient not taking: Reported on 03/24/2024) 60 tablet 5   niacin  (VITAMIN B3) 500 MG tablet Take 1,000 mg by mouth at bedtime. (Patient not taking: Reported on 03/24/2024)     pregabalin (LYRICA) 75 MG capsule Take 75 mg by mouth 3 (three) times daily. (Patient not taking: Reported on 03/24/2024)     No current facility-administered medications on file prior to visit.    Allergies:  Allergies  Allergen Reactions   Crestor [Rosuvastatin Calcium] Other (See Comments)    Caused Myalgias   Effexor [Venlafaxine Hydrochloride] Other (See Comments) and Hypertension    Caused BP to go up   Aricept  [Donepezil ]  Other reaction(s): diarrhea   Cream Base Other (See Comments)    SOUR CREAM ONLY   Latex     Other reaction(s): rash   Nardil [Phenelzine]     Other reaction(s): HTN   Pork-Derived Products    Hibiclens  [Chlorhexidine  Gluconate] Hives   Lipitor [Atorvastatin Calcium] Rash   Lisinopril Rash    Other reaction(s): rash   Penicillins Swelling and Other (See Comments)    Tolerated Cephalosporin Date: 05/01/22     Plavix [Clopidogrel Bisulfate] Rash   Simvastatin Rash    Other reaction(s): rash   Sulfa Antibiotics Rash   Sulfonamide Derivatives Rash   Tape Rash    Other reaction(s): rash    Vital Signs:  BP 137/72   Pulse 91   Ht 5' 1.25 (1.556 m)   SpO2 99%   BMI 27.55 kg/m     Neurological Exam: MENTAL STATUS including orientation to time,  place, person, recent and remote memory, attention span and concentration, language, and fund of knowledge is normal.  Speech is show mild dysarthria.  CRANIAL NERVES:   Pupils equal round and reactive to light.  Normal conjugate, extra-ocular eye movements in all directions of gaze. There is mild ptosis at rest and no worse with sustained upgaze. Face is symmetric.  Facial muscles are intact.   MOTOR:  Motor strength is 5-/5 in all extremities.   No pronator drift.  Tone is normal.    COORDINATION/GAIT:  Gait not tested, patient arrived in wheelchair.    Data: CT head 05/29/2022:  negative  Lab Results  Component Value Date   TSH 5.134 (H) 05/27/2022    Lab Results  Component Value Date   HGBA1C 7.4 (H) 11/15/2022   CT lumbar spine wo contrast 11/11/2022: 1. No acute osseous injury within the lumbar spine. 2. 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 3. 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    Thank you for allowing me to participate in patient's care.  If I can answer any additional questions, I would be pleased to do so.    Sincerely,    Veto Macqueen K. Lydia Sams, DO

## 2024-04-07 ENCOUNTER — Ambulatory Visit: Admitting: Neurology

## 2024-04-07 ENCOUNTER — Ambulatory Visit

## 2024-04-07 VITALS — BP 148/74 | HR 98 | Temp 98.6°F | Resp 18 | Ht 61.25 in | Wt 149.4 lb

## 2024-04-07 DIAGNOSIS — G7001 Myasthenia gravis with (acute) exacerbation: Secondary | ICD-10-CM | POA: Diagnosis not present

## 2024-04-07 MED ORDER — IMMUNE GLOBULIN (HUMAN) 5 GM/50ML IV SOLN
65.0000 g | Freq: Once | INTRAVENOUS | Status: AC
  Administered 2024-04-07: 65 g via INTRAVENOUS

## 2024-04-07 MED ORDER — ACETAMINOPHEN 325 MG PO TABS
650.0000 mg | ORAL_TABLET | Freq: Once | ORAL | Status: AC
Start: 2024-04-07 — End: 2024-04-07
  Administered 2024-04-07: 650 mg via ORAL
  Filled 2024-04-07: qty 2

## 2024-04-07 MED ORDER — DIPHENHYDRAMINE HCL 25 MG PO CAPS
25.0000 mg | ORAL_CAPSULE | Freq: Once | ORAL | Status: AC
  Administered 2024-04-07: 25 mg via ORAL
  Filled 2024-04-07: qty 1

## 2024-04-07 MED ORDER — DEXTROSE 5 % IV SOLN: INTRAVENOUS | Status: DC

## 2024-04-07 NOTE — Progress Notes (Signed)
 Diagnosis: Myasthenia Gravis  Provider:  Praveen Mannam MD  Procedure: IV Infusion  IV Type: Peripheral, IV Location: R Antecubital  IVIG (Immune Globulin ), Dose: 65G  Infusion Start Time: 1001  Infusion Stop Time: 1502  Post Infusion IV Care: Peripheral IV Discontinued  Discharge: Condition: Good, Destination: Home . AVS Provided  Performed by:  Leita FORBES Miles, LPN

## 2024-04-23 NOTE — Progress Notes (Signed)
 Remote pacemaker transmission.

## 2024-04-23 NOTE — Addendum Note (Signed)
 Addended by: VICCI SELLER A on: 04/23/2024 03:05 PM   Modules accepted: Orders

## 2024-04-28 ENCOUNTER — Ambulatory Visit (INDEPENDENT_AMBULATORY_CARE_PROVIDER_SITE_OTHER)

## 2024-04-28 VITALS — BP 139/76 | HR 100 | Temp 98.5°F | Resp 16 | Ht 61.25 in | Wt 145.6 lb

## 2024-04-28 DIAGNOSIS — G7001 Myasthenia gravis with (acute) exacerbation: Secondary | ICD-10-CM | POA: Diagnosis not present

## 2024-04-28 MED ORDER — IMMUNE GLOBULIN (HUMAN) 5 GM/50ML IV SOLN
65.0000 g | Freq: Once | INTRAVENOUS | Status: AC
  Administered 2024-04-28: 65 g via INTRAVENOUS

## 2024-04-28 MED ORDER — DIPHENHYDRAMINE HCL 25 MG PO CAPS
25.0000 mg | ORAL_CAPSULE | Freq: Once | ORAL | Status: AC
Start: 2024-04-28 — End: 2024-04-28
  Administered 2024-04-28: 25 mg via ORAL
  Filled 2024-04-28: qty 1

## 2024-04-28 MED ORDER — ACETAMINOPHEN 325 MG PO TABS
650.0000 mg | ORAL_TABLET | Freq: Once | ORAL | Status: AC
  Administered 2024-04-28: 650 mg via ORAL
  Filled 2024-04-28: qty 2

## 2024-04-28 MED ORDER — DEXTROSE 5 % IV SOLN: INTRAVENOUS | Status: DC

## 2024-04-28 NOTE — Progress Notes (Signed)
 Diagnosis: Myasthenia Gravis  Provider:  Mannam, Praveen MD  Procedure: IV Infusion  IV Type: Peripheral, IV Location: R Forearm  IVIG (Immune Globulin ), Dose: 65 g  Infusion Start Time: 1001  Infusion Stop Time: 1501  Post Infusion IV Care: Peripheral IV Discontinued  Discharge: Condition: Good, Destination: Home . AVS Provided  Performed by:  Rocky FORBES Sar, RN

## 2024-05-19 ENCOUNTER — Ambulatory Visit

## 2024-05-19 VITALS — BP 132/73 | HR 93 | Temp 98.1°F | Resp 16 | Ht 61.25 in | Wt 145.8 lb

## 2024-05-19 DIAGNOSIS — G7001 Myasthenia gravis with (acute) exacerbation: Secondary | ICD-10-CM | POA: Diagnosis not present

## 2024-05-19 MED ORDER — DIPHENHYDRAMINE HCL 25 MG PO CAPS
25.0000 mg | ORAL_CAPSULE | Freq: Once | ORAL | Status: AC
Start: 2024-05-19 — End: 2024-05-19
  Administered 2024-05-19 (×2): 25 mg via ORAL
  Filled 2024-05-19: qty 1

## 2024-05-19 MED ORDER — DEXTROSE 5 % IV SOLN: INTRAVENOUS | Status: DC

## 2024-05-19 MED ORDER — ACETAMINOPHEN 325 MG PO TABS
650.0000 mg | ORAL_TABLET | Freq: Once | ORAL | Status: AC
  Administered 2024-05-19 (×2): 650 mg via ORAL
  Filled 2024-05-19: qty 2

## 2024-05-19 MED ORDER — IMMUNE GLOBULIN (HUMAN) 5 GM/50ML IV SOLN
65.0000 g | Freq: Once | INTRAVENOUS | Status: AC
  Administered 2024-05-19 (×2): 65 g via INTRAVENOUS

## 2024-05-19 NOTE — Progress Notes (Signed)
 Diagnosis: Myasthenia Gravis  Provider:  Mannam, Praveen MD  Procedure: IV Infusion  IV Type: Peripheral, IV Location: R Antecubital  IVIG (Immune Globulin ), Dose: 65 g  Infusion Start Time: 1007  Infusion Stop Time: 1507  Post Infusion IV Care: Peripheral IV Discontinued  Discharge: Condition: Stable, Destination: Home . AVS Provided  Performed by:  Rocky FORBES Sar, RN

## 2024-05-31 ENCOUNTER — Telehealth: Payer: Self-pay | Admitting: Neurology

## 2024-05-31 NOTE — Telephone Encounter (Signed)
 Pt.s nursing home called as CBC and CMP new order needed information needs approval for facility to continu, pls call insurance to get PA and call to let them know at [facility]

## 2024-06-02 ENCOUNTER — Ambulatory Visit (INDEPENDENT_AMBULATORY_CARE_PROVIDER_SITE_OTHER): Payer: Medicare Other

## 2024-06-02 DIAGNOSIS — I495 Sick sinus syndrome: Secondary | ICD-10-CM

## 2024-06-02 NOTE — Telephone Encounter (Signed)
 Called [phone] [name]. No answer or no answering machine service.

## 2024-06-03 NOTE — Telephone Encounter (Signed)
 Called Chelsea at [employer] [phone]. Left a message for a call back.

## 2024-06-03 NOTE — Telephone Encounter (Signed)
 Delores Mardy Dasie Brannon DELENA, CMA  Phone Number: [phone]   Got it!  Thank you!         Previous Messages    ----- Message ----- From: Dasie Brannon DELENA, CMA Sent: 06/03/2024   1:20 PM EDT To: Mardy Delores Subject: RE: Returning your call                        Oh ok, I did not see any patient information so I was unsure who you were. I was returning her call to let her know that Dr.Patel has not ordered any labs for [facility]. She was needing lab orders and for us  to call the insurance for a prior authorization. She will need to reach out to the appropriate physician for that.  Thank you, Winton Offord ----- Message ----- From: Delores Mardy Sent: 06/03/2024  12:55 PM EDT To: Brannon DELENA Dasie, CMA Subject: RE: Returning your call                        Regarding Hermela Hardt, DOB [birth year] You'd left a message for [place] at [facility].  She is not here today.  I'm the Director of Nursing.   ----- Message ----- From: Dasie Brannon DELENA, CMA Sent: 06/03/2024  11:39 AM EDT To: Mardy Delores Subject: RE: Returning your call                        How can I help up? ----- Message ----- From: Delores Mardy Sent: 06/03/2024  11:23 AM EDT To: Brannon DELENA Dasie, CMA Subject: Returning your call                            Attempted to reach you in order to return the call.  Please call me or message me here.  Thank you!

## 2024-06-04 LAB — CUP PACEART REMOTE DEVICE CHECK
Battery Impedance: 2628 Ohm
Battery Remaining Longevity: 31 mo
Battery Voltage: 2.74 V
Brady Statistic AP VP Percent: 0 %
Brady Statistic AP VS Percent: 1 %
Brady Statistic AS VP Percent: 0 %
Brady Statistic AS VS Percent: 99 %
Date Time Interrogation Session: 20250828132959
Implantable Lead Connection Status: 753985
Implantable Lead Connection Status: 753985
Implantable Lead Implant Date: 20070710
Implantable Lead Implant Date: 20070710
Implantable Lead Location: 753859
Implantable Lead Location: 753860
Implantable Lead Model: 4469
Implantable Lead Model: 4470
Implantable Lead Serial Number: 483166
Implantable Lead Serial Number: 541525
Implantable Pulse Generator Implant Date: 20130410
Lead Channel Impedance Value: 677 Ohm
Lead Channel Impedance Value: 836 Ohm
Lead Channel Pacing Threshold Amplitude: 0.75 V
Lead Channel Pacing Threshold Amplitude: 1.125 V
Lead Channel Pacing Threshold Pulse Width: 0.4 ms
Lead Channel Pacing Threshold Pulse Width: 0.4 ms
Lead Channel Setting Pacing Amplitude: 2.25 V
Lead Channel Setting Pacing Amplitude: 2.5 V
Lead Channel Setting Pacing Pulse Width: 0.4 ms
Lead Channel Setting Sensing Sensitivity: 5.6 mV
Zone Setting Status: 755011
Zone Setting Status: 755011

## 2024-06-09 ENCOUNTER — Ambulatory Visit (INDEPENDENT_AMBULATORY_CARE_PROVIDER_SITE_OTHER)

## 2024-06-09 VITALS — BP 152/74 | HR 84 | Temp 97.6°F | Resp 18 | Ht 61.25 in | Wt 145.2 lb

## 2024-06-09 DIAGNOSIS — G7001 Myasthenia gravis with (acute) exacerbation: Secondary | ICD-10-CM | POA: Diagnosis not present

## 2024-06-09 MED ORDER — DIPHENHYDRAMINE HCL 25 MG PO CAPS
25.0000 mg | ORAL_CAPSULE | Freq: Once | ORAL | Status: AC
  Administered 2024-06-09: 25 mg via ORAL
  Filled 2024-06-09: qty 1

## 2024-06-09 MED ORDER — ACETAMINOPHEN 325 MG PO TABS
650.0000 mg | ORAL_TABLET | Freq: Once | ORAL | Status: AC
  Administered 2024-06-09: 650 mg via ORAL
  Filled 2024-06-09: qty 2

## 2024-06-09 MED ORDER — IMMUNE GLOBULIN (HUMAN) 5 GM/50ML IV SOLN
65.0000 g | Freq: Once | INTRAVENOUS | Status: AC
  Administered 2024-06-09: 65 g via INTRAVENOUS

## 2024-06-09 MED ORDER — DEXTROSE 5 % IV SOLN: INTRAVENOUS | Status: DC

## 2024-06-09 NOTE — Progress Notes (Signed)
 Diagnosis: Myasthenia Gravis  Provider:  Praveen Mannam MD  Procedure: IV Infusion  IV Type: Peripheral, IV Location: L Antecubital  IVIG (Immune Globulin ), Dose: 65 g  Infusion Start Time: 1012  Infusion Stop Time: 1515  Post Infusion IV Care: Peripheral IV Discontinued  Discharge: Condition: Good, Destination: Home . AVS Provided  Performed by:  Maximiano JONELLE Pouch, LPN

## 2024-06-13 ENCOUNTER — Ambulatory Visit: Payer: Self-pay | Admitting: Cardiology

## 2024-06-30 ENCOUNTER — Ambulatory Visit

## 2024-06-30 VITALS — BP 144/77 | HR 98 | Temp 98.0°F | Resp 16 | Ht 61.25 in | Wt 143.4 lb

## 2024-06-30 DIAGNOSIS — G7001 Myasthenia gravis with (acute) exacerbation: Secondary | ICD-10-CM

## 2024-06-30 MED ORDER — ACETAMINOPHEN 325 MG PO TABS
650.0000 mg | ORAL_TABLET | Freq: Once | ORAL | Status: AC
  Administered 2024-06-30: 650 mg via ORAL
  Filled 2024-06-30: qty 2

## 2024-06-30 MED ORDER — DEXTROSE 5 % IV SOLN: INTRAVENOUS | Status: DC

## 2024-06-30 MED ORDER — DIPHENHYDRAMINE HCL 25 MG PO CAPS
25.0000 mg | ORAL_CAPSULE | Freq: Once | ORAL | Status: AC
  Administered 2024-06-30: 25 mg via ORAL
  Filled 2024-06-30: qty 1

## 2024-06-30 MED ORDER — IMMUNE GLOBULIN (HUMAN) 5 GM/50ML IV SOLN
65.0000 g | Freq: Once | INTRAVENOUS | Status: AC
  Administered 2024-06-30: 65 g via INTRAVENOUS

## 2024-06-30 NOTE — Progress Notes (Signed)
 Diagnosis: Myasthenia gravis with exacerbation   Provider:  Praveen Mannam MD  Procedure: IV Infusion  IV Type: Peripheral, IV Location: R Antecubital  IVIG (Immune Globulin ), Dose: 65 g  Infusion Start Time: 1010  Infusion Stop Time: 1513  Post Infusion IV Care: Peripheral IV Discontinued  Discharge: Condition: Good, Destination: Home . AVS Provided  Performed by:  Maximiano JONELLE Pouch, LPN

## 2024-07-20 ENCOUNTER — Encounter: Payer: Self-pay | Admitting: Neurology

## 2024-07-20 ENCOUNTER — Telehealth: Payer: Self-pay | Admitting: Neurology

## 2024-07-20 ENCOUNTER — Ambulatory Visit: Admitting: Neurology

## 2024-07-20 VITALS — BP 146/82 | HR 104 | Ht 61.25 in | Wt 146.0 lb

## 2024-07-20 DIAGNOSIS — G7001 Myasthenia gravis with (acute) exacerbation: Secondary | ICD-10-CM

## 2024-07-20 NOTE — Progress Notes (Signed)
 Follow-up Visit   Date: 07/20/2024    DAIL MEECE MRN: [id number] DOB: [birth year]    Heidi [name] is a 83 y.o. 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  The patient was accompanied to the clinic by self.     IMPRESSION/PLAN: Seronegative myasthenia gravis with exacerbation, diagnosed 07/2022.  Symptoms presented with dysarthria, diplopia, ptosis, and dysphagia. AChR and MUSK antibody negative, thymoma negative.  She was hospitalized in February 2024 for MG exacerbation and received IVIG.  In May 2024, she was readmitted with DKA and MG exacerbation.   Despite being on IVIG and prednisone , she has always been symptomatic.  She reports feeling slightly better recently and double vision and dysarthria is less often.  Motor strength on exam is 5/5 throughout.  With her leg strength 5/5, I would expect her to have better mobility, but she is predominately wheelchair bound.  There may be a psychosomatic component.   Continue IVIG 1g/kg every 3 weeks Continue prednisone  20mg  daily Continue mestinon  30mg  twice daily at 10a and 4pm  Return to clinic in 4 months  --------------------------------------------- UPDATE 07/17/2022:  She was hospitalized in August for falls and hallucinations, found to be associated with her dementia.  She was discharged to rehab and now back at [facility].  She was falling frequently prior to her hospitalization, but reports no falls over the past 2 months.  Since being home, she has noticed difficulty with speaking and words being slurred, especially as the day progresses.  She continues to have intermittent diplopia and wears prisms.  Eyelids still can be droopy, but slightly better than before.  Sometimes, her food gets stuck in her throat.  No arms or leg weakness.   She ha spells of visual  distortion, described as if a jug-saw puzzle pieces are moving.  This occurs most days of the week.  She also reports having headaches at the left base of her head.   UPDATE 07/31/2022:  She started mestinon  60mg  1 tablet at 9am and 2pm.  She has noticed that about 4-5 hours after she takes the tablet, her speech starts to get slurred and double vision returns.  She has some difficulty swallowing her morning medications.  No problems with eating.  She also reports having spells of leg weakness and had to use a wheelchair at one point after playing bingo.   UPDATE 08/28/2022:    In October, she had EMG was consistent with myasthenia gravis and was started on prednisone  10mg  daily.  She noticed marked improvement in leg strength, swallowing, speech, and double vision.  She continues to have mild intermittent double vision and difficulty with getting words out.  No slurred speech.  She has been taking mestinon  60mg  three times daily, but reports having diarrhea with it.  It does help, because by around 5-6pm, she is feeling generalized weakness again.   UPDATE 10/30/2022:  She is here for follow-up visit.  She reports no significant change in her double vision or difficulty talking since her last visit, despite increasing prednisone  to 15mg  daily.  She also feels that her legs are weak and has started PT for balance.    Due to GI upset, she reduced mestinon  to 30mg  at 9am, 60mg  at 2pm, and 30mg  at 5pm, which has helped diarrhea, but she continues to have cramps.  Overall, she is not feeling well.  UPDATE 12/30/2022:  She is here for follow-up visit.  She was hospitalized in February because of increased weakness and falls and received IVIG x 5 days.  She feels it may have stabilized her symptoms, but still feels weak and takes her medications with applesauce.  She continues to have mild slurred speech.  She walks with a walker even for short distances.  She will be getting home PT/OT.    UPDATE 03/24/2023:  She  is here for follow-up visit.  She was hospitalized in May due to DKA which daughter says was due to patient eating soft foods and drinking sweet due because of dysphagia.  While hospitalized she received a dose of IVIG because of generalized weakness.  Patient feels that IVIG helps, but by week 3 her symptoms start to get worse again.  She is due for infusion as her last one was in May when hospitalized. She has since moved to SNF at [facility] in [place].  Unfortunately, her SNF does not allow infusion at the facility. She remains on prednisone  20mg /d and mestinon  30mg  twice daily.  She still has weakness in the legs, dysphagia, and mild dysarthria.  She remains on mechanical soft diet and can use a walker with one-person assist, but does not do this very often.   UPDATE 05/13/2023:  She is here for follow-up visit. She has been getting IVIG every 3 weeks for he last two infusions and reports this has made a marked improvement because she is staying stronger for longer. Over the last 3-4 days, she has noticed a significant improvement in overall strength.  She is still using a wheelchair because she does not feel strong enough to use a walker.  She is able to transfer independently.  She is able to bathe herself, but needs assistance with getting in and out of the shower.  She has mild slurred speech when tired, double vision at end-gaze, and generalized fatigue.  She does feel that her last IVIG helped and she has felt the best in a few weeks.   She also complains of redness over the left upper arm which has been present for the past 5 days.  It does not itch or cause any pain.   UPDATE 07/22/2023:  She is here for follow-up visit. She continues to take prednisone  20mg  daily and mestinon  30mg  twice daily at 10a and 4pm.  She is also getting IVIG every 3 weeks and tolerating this well.  She has noticed that she has improved strength which lasts longer, typically she starts to get week about 3 days prior to  the next infusion.  She still has some double vision, difficulty with speech/swallow, and leg weakness, but overall is feeling much better.  She is reliant on a wheelchair because her legs feel weak. PT was started last week.   UPDATE 10/22/2023:  She is here for follow-up visit.  She reports having improved overall strength with the last two infusions and was able to travel in and out of the car much better during the holidays.  She is able to stand and walk about 40 feet with a rollator.  She continues to have blurred vision, some double vision, and speech problems. She has seen her eye doctor and had recent change to prescription as well as prisms.  She has been wearing prisms for many years.  Overall, she is feeling much better.  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   UPDATE 01/07/2024:  She is here for follow-up visit.  She reports that since her last visit, her symptoms have been getting worse. She continues to have blurred vision, double vision, difficulty with speech, and generalized weakness.  She is no longer walking like she did before and is mostly wheelchair bound.    UPDATE 03/24/2024:  She reports having some benefit with her last IVIG and increase in prednisone  such that her energy was better. She continues to have double vision and difficulty with speech.  No problems with swallow.  She continues to have generalized weakness, no worse than before.  She gets assistance with all ADLs, except for feeding.  No new complaints.   UPDATE 07/20/2024:  She reports that her vision has improved since getting new glasses.  Her speech is also improved.  She is on prednisone  20mg , IVIG every 3 weeks, and mestinon  30mg  BID.  She is predominately wheelchair bound.  None of her symptoms are worse.    Medications:  Current Outpatient Medications on File Prior to Visit  Medication Sig Dispense Refill    cholestyramine  (QUESTRAN ) 4 g packet Take 4 g by mouth See admin instructions. 2 entries on MAR: 1) 4g once daily. 2) 4g twice daily as needed for loose stool.     gabapentin  (NEURONTIN ) 300 MG capsule Take 300 mg by mouth 3 (three) times daily.     galantamine  (RAZADYNE  ER) 8 MG 24 hr capsule Take 4 mg by mouth daily.  2   hydrOXYzine  (ATARAX /VISTARIL ) 25 MG tablet Take 25 mg by mouth at bedtime.     insulin  aspart (NOVOLOG  FLEXPEN) 100 UNIT/ML FlexPen 0-9 Units, Subcutaneous, 3 times daily with meals CBG < 70: Implement Hypoglycemia measures CBG 70 - 120: 0 units CBG 121 - 150: 1 unit CBG 151 - 200: 2 units CBG 201 - 250: 3 units CBG 251 - 300: 5 units CBG 301 - 350: 7 units CBG 351 - 400: 9 units CBG > 400: call MD 15 mL 0   insulin  aspart protamine - aspart (NOVOLOG  70/30 FLEXPEN) (70-30) 100 UNIT/ML FlexPen Inject 100 Units into the skin 2 (two) times daily. Sliding Scale     insulin  glargine-yfgn (SEMGLEE ) 100 UNIT/ML injection Inject 0.2 mLs (20 Units total) into the skin at bedtime. 10 mL 11   LANTUS  SOLOSTAR 100 UNIT/ML Solostar Pen Inject into the skin. 15 units at bedtime     lidocaine  (LIDODERM ) 5 % Place 1 patch onto the skin daily. Remove & Discard patch within 12 hours or as directed by MD 30 patch 0   lidocaine  (XYLOCAINE ) 5 % ointment Apply 1 Application topically as needed. 35.44 g 0   losartan (COZAAR) 50 MG tablet Take 50 mg by mouth See admin instructions. Give 1.5 tablets orally one time a day for HTN     melatonin 3 MG TABS tablet Take 1 tablet (3 mg total) by mouth at bedtime.  0   metFORMIN  (GLUCOPHAGE ) 500 MG tablet Take 500 mg by mouth 2 (two) times daily.     methocarbamol  (ROBAXIN ) 500 MG tablet Take 1 tablet (500 mg total) by mouth 2 (two) times daily. 20 tablet 0   nortriptyline  (PAMELOR ) 50 MG capsule Take 2 capsules (100 mg total) by mouth at bedtime.     pantoprazole  (PROTONIX ) 40 MG tablet Take 40 mg by mouth 2 (two) times daily.     pyridostigmine  (MESTINON ) 60  MG tablet Take half-tablet at  10a and 4pm. 90 tablet 5   simethicone  (MYLICON) 80 MG chewable tablet Chew 80 mg by mouth every 6 (six) hours as needed for flatulence.     acetaminophen  (TYLENOL ) 325 MG tablet Take 2 tablets (650 mg total) by mouth every 6 (six) hours as needed for mild pain (or Fever >/= 101).     alum & mag hydroxide-simeth (MAALOX PLUS) 400-400-40 MG/5ML suspension Take by mouth every 6 (six) hours as needed for indigestion. 30 ML every 4 hours as needed     amLODipine (NORVASC) 2.5 MG tablet Take 2.5 mg by mouth daily.     ASPIRIN  LOW DOSE 81 MG chewable tablet Chew 81 mg by mouth daily.     Cholecalciferol  (VITAMIN D3) 125 MCG (5000 UT) CAPS Take 5,000 Units by mouth daily. (Patient not taking: Reported on 03/24/2024)     cloNIDine (CATAPRES) 0.1 MG tablet Take 0.1 mg by mouth 3 (three) times daily.     cyanocobalamin  1000 MCG tablet Take 1 tablet (1,000 mcg total) by mouth daily.     ezetimibe  (ZETIA ) 10 MG tablet Take 10 mg by mouth daily.     fenofibrate  160 MG tablet Take 160 mg by mouth daily. (Patient not taking: Reported on 03/24/2024)     ferrous sulfate  325 (65 FE) MG tablet Take 325 mg by mouth daily.     fexofenadine (ALLEGRA) 180 MG tablet Take 180 mg by mouth daily.     fluticasone  (FLONASE ) 50 MCG/ACT nasal spray Place 1 spray into both nostrils 2 (two) times daily.     Fluticasone  Propionate, Inhal, 50 MCG/ACT AEPB Inhale 1 spray into the lungs as needed.     furosemide  (LASIX ) 20 MG tablet TAKE 1 TABLET BY MOUTH 3 TIMES A WEEK. 45 tablet 3   levothyroxine  (SYNTHROID ) 88 MCG tablet Take 88 mcg by mouth daily before breakfast.     lidocaine  (LIDOCAN) 5 % Place 1 patch onto the skin daily. Remove & Discard patch within 12 hours or as directed by MD     meloxicam  (MOBIC ) 7.5 MG tablet Take 1 tablet (7.5 mg total) by mouth daily. (Patient not taking: Reported on 03/24/2024) 15 tablet 0   Menthol , Topical Analgesic, (BIOFREEZE COOL THE PAIN) 4 % GEL Apply topically.      mycophenolate  (CELLCEPT ) 500 MG tablet Take 1 tablet (500 mg total) by mouth 2 (two) times daily. (Patient not taking: Reported on 03/24/2024) 60 tablet 5   niacin  (VITAMIN B3) 500 MG tablet Take 1,000 mg by mouth at bedtime. (Patient not taking: Reported on 03/24/2024)     oxyCODONE  (ROXICODONE ) 5 MG immediate release tablet Take 1 tablet (5 mg total) by mouth every 6 (six) hours as needed for severe pain (pain score 7-10). 15 tablet 0   phenylephrine -shark liver oil-mineral oil-petrolatum (PREPARATION H) 0.25-14-74.9 % rectal ointment Place 1 Application rectally 2 (two) times daily as needed for hemorrhoids.     predniSONE  (DELTASONE ) 10 MG tablet Take 2 tablets (20 mg total) by mouth daily with breakfast. 60 tablet 5   pregabalin (LYRICA) 75 MG capsule Take 75 mg by mouth 3 (three) times daily. (Patient not taking: Reported on 03/24/2024)     sodium chloride  (OCEAN) 0.65 % SOLN nasal spray Place 1 spray into both nostrils as needed for congestion.     spironolactone  (ALDACTONE ) 25 MG tablet Take 1 tablet (25 mg total) by mouth daily. 30 tablet 11   No current facility-administered medications on file prior to visit.  Allergies:  Allergies  Allergen Reactions   Crestor [Rosuvastatin Calcium] Other (See Comments)    Caused Myalgias   Effexor [Venlafaxine Hydrochloride] Other (See Comments) and Hypertension    Caused BP to go up   Aricept  [Donepezil ]     Other reaction(s): diarrhea   Cream Base Other (See Comments)    SOUR CREAM ONLY   Latex     Other reaction(s): rash   Nardil [Phenelzine]     Other reaction(s): HTN   Porcine (Pork) Protein-Containing Drug Products    Hibiclens  [Chlorhexidine  Gluconate] Hives   Lipitor [Atorvastatin Calcium] Rash   Lisinopril Rash    Other reaction(s): rash   Penicillins Swelling and Other (See Comments)    Tolerated Cephalosporin Date: 05/01/22     Plavix [Clopidogrel Bisulfate] Rash   Simvastatin Rash    Other reaction(s): rash   Sulfa  Antibiotics Rash   Sulfonamide Derivatives Rash   Tape Rash    Other reaction(s): rash    Vital Signs:  BP (!) 176/113   Pulse (!) 104   Ht 5' 1.25 (1.556 m)   Wt 146 lb (66.2 kg)   SpO2 100%   BMI 27.36 kg/m     Neurological Exam: MENTAL STATUS including orientation to time, place, person, recent and remote memory, attention span and concentration, language, and fund of knowledge is normal.  Speech is show mild dysarthria.  CRANIAL NERVES:   Pupils equal round and reactive to light.  Normal conjugate, extra-ocular eye movements in all directions of gaze. There is mild ptosis at rest and no worse with sustained upgaze. Face is symmetric.  Facial muscles are intact.   MOTOR:  Motor strength is 5/5 in all extremities.   No pronator drift.  Tone is normal.    COORDINATION/GAIT:  Gait not tested, patient arrived in wheelchair.    Data: CT head 05/29/2022:  negative  Lab Results  Component Value Date   TSH 5.134 (H) 05/27/2022    Lab Results  Component Value Date   HGBA1C 7.4 (H) 11/15/2022   CT lumbar spine wo contrast 11/11/2022: 1. No acute osseous injury within the lumbar spine. 2. 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 3. 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  Total time spent reviewing records, interview, history/exam, documentation, and coordination of care on day of encounter:  20 minutes    Thank you for allowing me to participate in patient's care.  If I can answer any additional questions, I would be pleased to do so.    Sincerely,    Fynn Vanblarcom K. Tobie, DO

## 2024-07-21 ENCOUNTER — Ambulatory Visit (INDEPENDENT_AMBULATORY_CARE_PROVIDER_SITE_OTHER)

## 2024-07-21 ENCOUNTER — Telehealth: Payer: Self-pay

## 2024-07-21 VITALS — BP 145/73 | HR 101 | Temp 97.8°F | Resp 22 | Ht 61.25 in | Wt 145.8 lb

## 2024-07-21 DIAGNOSIS — G7001 Myasthenia gravis with (acute) exacerbation: Secondary | ICD-10-CM | POA: Diagnosis not present

## 2024-07-21 MED ORDER — ACETAMINOPHEN 325 MG PO TABS
650.0000 mg | ORAL_TABLET | Freq: Once | ORAL | Status: AC
  Administered 2024-07-21: 650 mg via ORAL
  Filled 2024-07-21: qty 2

## 2024-07-21 MED ORDER — DIPHENHYDRAMINE HCL 25 MG PO CAPS
25.0000 mg | ORAL_CAPSULE | Freq: Once | ORAL | Status: AC
  Administered 2024-07-21: 25 mg via ORAL
  Filled 2024-07-21: qty 1

## 2024-07-21 MED ORDER — IMMUNE GLOBULIN (HUMAN) 5 GM/50ML IV SOLN
65.0000 g | Freq: Once | INTRAVENOUS | Status: AC
  Administered 2024-07-21: 65 g via INTRAVENOUS

## 2024-07-21 MED ORDER — DEXTROSE 5 % IV SOLN: INTRAVENOUS | Status: DC

## 2024-07-21 NOTE — Progress Notes (Signed)
 Diagnosis: Myasthenia Gravis  Provider:  Mannam, Praveen MD  Procedure: IV Infusion  IV Type: Peripheral, IV Location: R Forearm  IVIG (Immune Globulin ), Dose: 65 g  Infusion Start Time: 1012  Infusion Stop Time: 1530  Post Infusion IV Care: Peripheral IV Discontinued  Discharge: Condition: Good, Destination: Nursing Home . AVS Provided  Performed by:  Rocky FORBES Sar, RN

## 2024-07-21 NOTE — Telephone Encounter (Signed)
 Called patient and informed her that she will need to contact her PCP to address her issues per below. Patient verbalized understanding and had no further question or concerns.

## 2024-07-21 NOTE — Telephone Encounter (Signed)
 Upon check out patient forgot to mention to Dr. Tobie that at times she has a strangled, coughing, and drainage feeling in her throat. Patient states that it wakes her up at night. Patient states that she spits up a lot of drainage in a kleenex when this occurs.  Spoke with Dr. Tobie per above and Dr. Tobie has advised for patient to contact her PCP in regards to this.

## 2024-07-29 ENCOUNTER — Telehealth: Payer: Self-pay

## 2024-07-29 NOTE — Telephone Encounter (Signed)
 Called patients living facility [facility]. Left a message for Alisa the director for a return call. Need to ask if they allow infusion services in their facility. Explained on recording patient is needing infusions done and wanting to see if they allow at her facility. Left contact number for a call back.

## 2024-07-30 NOTE — Telephone Encounter (Signed)
 Called patient and informed her that I had reached out to [facility] and left a message but since I haven't heard anything back I wanted to reach out to patient. I asked patient if her living facility allows infusions services to come and infuse in their facility? Patient stated No they do not. Informed patient that I will let Dr. Tobie know about this.

## 2024-07-30 NOTE — Telephone Encounter (Signed)
 Error

## 2024-07-30 NOTE — Telephone Encounter (Signed)
Called patient and unable to leave a message as mailbox is full.  ?

## 2024-07-30 NOTE — Telephone Encounter (Signed)
 Heidi [name] stating she was returning a call from the office.

## 2024-08-02 NOTE — Telephone Encounter (Signed)
 Noted, will continue out-patient infusions.

## 2024-08-11 ENCOUNTER — Ambulatory Visit

## 2024-08-11 VITALS — BP 111/7 | HR 95 | Temp 98.2°F | Resp 18 | Ht 61.25 in | Wt 144.8 lb

## 2024-08-11 DIAGNOSIS — G7001 Myasthenia gravis with (acute) exacerbation: Secondary | ICD-10-CM

## 2024-08-11 MED ORDER — ACETAMINOPHEN 325 MG PO TABS
650.0000 mg | ORAL_TABLET | Freq: Once | ORAL | Status: AC
  Administered 2024-08-11: 650 mg via ORAL
  Filled 2024-08-11: qty 2

## 2024-08-11 MED ORDER — IMMUNE GLOBULIN (HUMAN) 5 GM/50ML IV SOLN
65.0000 g | Freq: Once | INTRAVENOUS | Status: AC
  Administered 2024-08-11: 65 g via INTRAVENOUS

## 2024-08-11 MED ORDER — DIPHENHYDRAMINE HCL 25 MG PO CAPS
25.0000 mg | ORAL_CAPSULE | Freq: Once | ORAL | Status: AC
  Administered 2024-08-11: 25 mg via ORAL
  Filled 2024-08-11: qty 1

## 2024-08-11 MED ORDER — DEXTROSE 5 % IV SOLN: INTRAVENOUS | Status: DC

## 2024-08-11 NOTE — Progress Notes (Signed)
 Diagnosis: Myasthenia Gravis  Provider:  Praveen Mannam MD  Procedure: IV Infusion  IV Type: Peripheral, IV Location: R Forearm  Privigen  (Immune Globulin ), Dose: 65g  Infusion Start Time: 1000  Infusion Stop Time: 1525  Post Infusion IV Care: Peripheral IV Discontinued  Discharge: Condition: Good, Destination: Home . AVS Provided  Performed by:  Evely Gainey, RN

## 2024-09-01 ENCOUNTER — Ambulatory Visit: Payer: Medicare Other

## 2024-09-01 ENCOUNTER — Ambulatory Visit

## 2024-09-01 DIAGNOSIS — I495 Sick sinus syndrome: Secondary | ICD-10-CM | POA: Diagnosis not present

## 2024-09-01 LAB — CUP PACEART REMOTE DEVICE CHECK
Battery Impedance: 2666 Ohm
Battery Remaining Longevity: 30 mo
Battery Voltage: 2.74 V
Brady Statistic AP VP Percent: 0 %
Brady Statistic AP VS Percent: 1 %
Brady Statistic AS VP Percent: 0 %
Brady Statistic AS VS Percent: 99 %
Date Time Interrogation Session: 20251126102721
Implantable Lead Connection Status: 753985
Implantable Lead Connection Status: 753985
Implantable Lead Implant Date: 20070710
Implantable Lead Implant Date: 20070710
Implantable Lead Location: 753859
Implantable Lead Location: 753860
Implantable Lead Model: 4469
Implantable Lead Model: 4470
Implantable Lead Serial Number: 483166
Implantable Lead Serial Number: 541525
Implantable Pulse Generator Implant Date: 20130410
Lead Channel Impedance Value: 607 Ohm
Lead Channel Impedance Value: 849 Ohm
Lead Channel Pacing Threshold Amplitude: 0.75 V
Lead Channel Pacing Threshold Amplitude: 1.25 V
Lead Channel Pacing Threshold Pulse Width: 0.4 ms
Lead Channel Pacing Threshold Pulse Width: 0.4 ms
Lead Channel Setting Pacing Amplitude: 2.5 V
Lead Channel Setting Pacing Amplitude: 2.5 V
Lead Channel Setting Pacing Pulse Width: 0.4 ms
Lead Channel Setting Sensing Sensitivity: 5.6 mV
Zone Setting Status: 755011
Zone Setting Status: 755011

## 2024-09-01 MED ORDER — DEXTROSE 5 % IV SOLN: INTRAVENOUS | Status: DC

## 2024-09-01 MED ORDER — ACETAMINOPHEN 325 MG PO TABS: 650.0000 mg | ORAL_TABLET | Freq: Once | ORAL | Status: DC

## 2024-09-01 MED ORDER — DIPHENHYDRAMINE HCL 25 MG PO CAPS: 25.0000 mg | ORAL_CAPSULE | Freq: Once | ORAL | Status: DC

## 2024-09-03 NOTE — Progress Notes (Signed)
 Remote PPM Transmission

## 2024-09-06 ENCOUNTER — Encounter: Payer: Self-pay | Admitting: Neurology

## 2024-09-06 ENCOUNTER — Ambulatory Visit: Payer: Self-pay | Admitting: Cardiology

## 2024-09-13 ENCOUNTER — Ambulatory Visit

## 2024-09-13 VITALS — BP 145/71 | HR 96 | Temp 98.0°F | Resp 18 | Ht 61.25 in | Wt 145.8 lb

## 2024-09-13 DIAGNOSIS — G7001 Myasthenia gravis with (acute) exacerbation: Secondary | ICD-10-CM

## 2024-09-13 MED ORDER — DIPHENHYDRAMINE HCL 25 MG PO CAPS
25.0000 mg | ORAL_CAPSULE | Freq: Once | ORAL | Status: AC
  Administered 2024-09-13: 25 mg via ORAL
  Filled 2024-09-13: qty 1

## 2024-09-13 MED ORDER — ACETAMINOPHEN 325 MG PO TABS
650.0000 mg | ORAL_TABLET | Freq: Once | ORAL | Status: AC
  Administered 2024-09-13: 650 mg via ORAL
  Filled 2024-09-13: qty 2

## 2024-09-13 MED ORDER — IMMUNE GLOBULIN (HUMAN) 5 GM/50ML IV SOLN
65.0000 g | Freq: Once | INTRAVENOUS | Status: AC
  Administered 2024-09-13: 65 g via INTRAVENOUS

## 2024-09-13 MED ORDER — DEXTROSE 5 % IV SOLN: INTRAVENOUS | Status: DC

## 2024-09-13 NOTE — Progress Notes (Signed)
 Diagnosis: Myasthenia Gravis  Provider:  Mannam, Praveen MD  Procedure: IV Infusion  IV Type: Peripheral, IV Location: L Antecubital  Privigen  (Immune Globulin ), Dose: 65 g  Infusion Start Time: 1010  Infusion Stop Time: 1510  Post Infusion IV Care: Peripheral IV Discontinued  Discharge: Condition: Good, Destination: Skilled nursing facility . AVS Provided  Performed by:  Rocky FORBES Sar, RN

## 2024-09-22 ENCOUNTER — Ambulatory Visit

## 2024-10-04 ENCOUNTER — Ambulatory Visit: Admitting: *Deleted

## 2024-10-04 VITALS — BP 127/72 | HR 93 | Temp 97.8°F | Resp 20 | Ht 61.25 in | Wt 145.5 lb

## 2024-10-04 DIAGNOSIS — G7001 Myasthenia gravis with (acute) exacerbation: Secondary | ICD-10-CM

## 2024-10-04 MED ORDER — IMMUNE GLOBULIN (HUMAN) 5 GM/50ML IV SOLN
65.0000 g | Freq: Once | INTRAVENOUS | Status: AC
  Administered 2024-10-04: 65 g via INTRAVENOUS

## 2024-10-04 MED ORDER — DEXTROSE 5 % IV SOLN: INTRAVENOUS | Status: DC

## 2024-10-04 MED ORDER — DIPHENHYDRAMINE HCL 25 MG PO CAPS
25.0000 mg | ORAL_CAPSULE | Freq: Once | ORAL | Status: AC
  Administered 2024-10-04: 25 mg via ORAL
  Filled 2024-10-04: qty 1

## 2024-10-04 MED ORDER — ACETAMINOPHEN 325 MG PO TABS
650.0000 mg | ORAL_TABLET | Freq: Once | ORAL | Status: AC
  Administered 2024-10-04: 650 mg via ORAL
  Filled 2024-10-04: qty 2

## 2024-10-04 NOTE — Progress Notes (Signed)
 Diagnosis:  Myasthenia Gravis  Provider:  Praveen Mannam MD  Procedure: IV Infusion  IV Type: Peripheral, IV Location: R Antecubital   IVIG (Immune Globulin ), Dose: 65 g  Infusion Start Time: 1005 am  Infusion Stop Time: 1446 pm  Post Infusion IV Care: Observation period completed and Peripheral IV Discontinued  Discharge: Condition: Good, Destination: Home . AVS provided  Performed by:  Maximiano JONELLE Pouch, LPN

## 2024-10-06 ENCOUNTER — Ambulatory Visit

## 2024-10-07 ENCOUNTER — Telehealth: Payer: Self-pay

## 2024-10-07 NOTE — Telephone Encounter (Signed)
 Auth Submission: NO AUTH NEEDED Site of care: Site of care: [facility] Payer: [employer] Medication & CPT/J Code(s) submitted: Privigen  (IVIG) J1459 Route of submission (phone, fax, portal): portal Phone #  Fax # Auth type: Buy/Bill PB Units/visits requested: 65mg  x 17 doses Reference number:  Approval from: 10/07/24 to 10/06/25

## 2024-10-20 ENCOUNTER — Telehealth (HOSPITAL_COMMUNITY): Payer: Self-pay | Admitting: Pharmacist

## 2024-10-20 ENCOUNTER — Encounter: Payer: Self-pay | Admitting: Neurology

## 2024-10-20 NOTE — Telephone Encounter (Signed)
 Patient due for next Privigen  dose on 10/27/2024.  Orders last entered on 10/22/2024. Will need these updated since will be  > 84 year old.  Current dose: 1g/kg of Privigen  every 3 weeks Premeds: diphenhydramine  25mg  p.o. and acetaminophen  650mg  p.o  Routing to Dr. Tonita Tobie Sherry Dayne, PharmD, MPH, BCPS, CPP Clinical Pharmacist

## 2024-10-20 NOTE — Telephone Encounter (Signed)
 Therapy plan reordered  Current dose: 1g/kg of Privigen  every 3 weeks Premeds: diphenhydramine  25mg  p.o. and acetaminophen  650mg  p.o  Sherry Pennant, PharmD, MPH, BCPS, CPP Clinical Pharmacist

## 2024-10-27 ENCOUNTER — Ambulatory Visit

## 2024-10-27 VITALS — BP 144/77 | HR 102 | Temp 97.9°F | Resp 18 | Ht 61.25 in | Wt 144.2 lb

## 2024-10-27 DIAGNOSIS — G7001 Myasthenia gravis with (acute) exacerbation: Secondary | ICD-10-CM

## 2024-10-27 MED ORDER — ACETAMINOPHEN 325 MG PO TABS
650.0000 mg | ORAL_TABLET | Freq: Once | ORAL | Status: AC
  Administered 2024-10-27: 650 mg via ORAL
  Filled 2024-10-27: qty 2

## 2024-10-27 MED ORDER — DIPHENHYDRAMINE HCL 25 MG PO CAPS
25.0000 mg | ORAL_CAPSULE | Freq: Once | ORAL | Status: AC
  Administered 2024-10-27: 25 mg via ORAL
  Filled 2024-10-27: qty 1

## 2024-10-27 MED ORDER — IMMUNE GLOBULIN (HUMAN) 5 GM/50ML IV SOLN
1.0000 g/kg | Freq: Once | INTRAVENOUS | Status: AC
  Administered 2024-10-27: 65 g via INTRAVENOUS

## 2024-10-27 MED ORDER — DEXTROSE 5 % IV SOLN: INTRAVENOUS | Status: DC

## 2024-10-27 NOTE — Progress Notes (Signed)
 Diagnosis:  Myasthenia Gravis  Provider:  Praveen Mannam MD  Procedure: IV Infusion  IV Type: Peripheral, IV Location: L Antecubital   IVIG (Immune Globulin ), Dose: 65 g  Infusion Start Time: 1026  Infusion Stop Time: 1515  Post Infusion IV Care: Peripheral IV Discontinued  Discharge: Condition: Good, Destination: Home . AVS Provided  Performed by:  Maximiano JONELLE Pouch, LPN

## 2024-11-10 ENCOUNTER — Telehealth: Payer: Self-pay | Admitting: Neurology

## 2024-11-10 NOTE — Telephone Encounter (Signed)
 [facility] stating that she was calling to give an update on Heidi [name]. The provider there at the facility visited with her and wanted Dr. Tobie to be aware of a few things.  PH: [phone]

## 2024-11-11 NOTE — Telephone Encounter (Signed)
 Called Sam and left a message for a call back.

## 2024-11-17 ENCOUNTER — Ambulatory Visit

## 2024-12-01 ENCOUNTER — Ambulatory Visit

## 2024-12-07 ENCOUNTER — Ambulatory Visit: Admitting: Neurology

## 2025-03-02 ENCOUNTER — Ambulatory Visit

## 2025-06-01 ENCOUNTER — Ambulatory Visit

## 2025-08-31 ENCOUNTER — Ambulatory Visit

## 2025-11-30 ENCOUNTER — Ambulatory Visit
